# Patient Record
Sex: Male | Born: 1960 | Race: White | Hispanic: No | Smoking: Former smoker
Health system: Southern US, Community
[De-identification: ages and names within clinical notes are randomized; demographics above are authoritative.]

## PROBLEM LIST (undated history)

## (undated) DIAGNOSIS — F482 Pseudobulbar affect: Secondary | ICD-10-CM

## (undated) DIAGNOSIS — E538 Deficiency of other specified B group vitamins: Secondary | ICD-10-CM

## (undated) DIAGNOSIS — Z8709 Personal history of other diseases of the respiratory system: Secondary | ICD-10-CM

## (undated) DIAGNOSIS — I739 Peripheral vascular disease, unspecified: Secondary | ICD-10-CM

## (undated) DIAGNOSIS — M1990 Unspecified osteoarthritis, unspecified site: Secondary | ICD-10-CM

## (undated) DIAGNOSIS — R001 Bradycardia, unspecified: Secondary | ICD-10-CM

## (undated) DIAGNOSIS — K622 Anal prolapse: Secondary | ICD-10-CM

## (undated) DIAGNOSIS — G8929 Other chronic pain: Secondary | ICD-10-CM

## (undated) DIAGNOSIS — R51 Headache: Secondary | ICD-10-CM

## (undated) DIAGNOSIS — M549 Dorsalgia, unspecified: Secondary | ICD-10-CM

## (undated) HISTORY — PX: COLOSTOMY CLOSURE: SHX1381

## (undated) HISTORY — PX: COLON SURGERY: SHX602

## (undated) HISTORY — PX: ESOPHAGOGASTRODUODENOSCOPY: SHX1529

## (undated) HISTORY — PX: BACK SURGERY: SHX140

## (undated) HISTORY — PX: COLOSTOMY: SHX63

## (undated) HISTORY — DX: Pseudobulbar affect: F48.2

---

## 2013-06-12 ENCOUNTER — Encounter (HOSPITAL_COMMUNITY): Payer: Self-pay | Admitting: Emergency Medicine

## 2013-06-12 ENCOUNTER — Emergency Department (HOSPITAL_COMMUNITY): Payer: Self-pay

## 2013-06-12 ENCOUNTER — Emergency Department (HOSPITAL_COMMUNITY)
Admission: EM | Admit: 2013-06-12 | Discharge: 2013-06-13 | Disposition: A | Discharge status: Home / Self Care | Payer: MEDICAID | Attending: Emergency Medicine | Admitting: Emergency Medicine

## 2013-06-12 DIAGNOSIS — L97319 Non-pressure chronic ulcer of right ankle with unspecified severity: Secondary | ICD-10-CM

## 2013-06-12 DIAGNOSIS — Z2089 Contact with and (suspected) exposure to other communicable diseases: Secondary | ICD-10-CM

## 2013-06-12 DIAGNOSIS — I8311 Varicose veins of right lower extremity with inflammation: Secondary | ICD-10-CM

## 2013-06-12 DIAGNOSIS — Z880 Allergy status to penicillin: Secondary | ICD-10-CM | POA: Insufficient documentation

## 2013-06-12 DIAGNOSIS — L97909 Non-pressure chronic ulcer of unspecified part of unspecified lower leg with unspecified severity: Secondary | ICD-10-CM | POA: Insufficient documentation

## 2013-06-12 DIAGNOSIS — M25473 Effusion, unspecified ankle: Secondary | ICD-10-CM | POA: Insufficient documentation

## 2013-06-12 DIAGNOSIS — M25579 Pain in unspecified ankle and joints of unspecified foot: Secondary | ICD-10-CM | POA: Insufficient documentation

## 2013-06-12 DIAGNOSIS — F17200 Nicotine dependence, unspecified, uncomplicated: Secondary | ICD-10-CM | POA: Insufficient documentation

## 2013-06-12 DIAGNOSIS — I8310 Varicose veins of unspecified lower extremity with inflammation: Secondary | ICD-10-CM | POA: Insufficient documentation

## 2013-06-12 DIAGNOSIS — L259 Unspecified contact dermatitis, unspecified cause: Secondary | ICD-10-CM | POA: Insufficient documentation

## 2013-06-12 DIAGNOSIS — M25476 Effusion, unspecified foot: Secondary | ICD-10-CM | POA: Insufficient documentation

## 2013-06-12 DIAGNOSIS — Z8719 Personal history of other diseases of the digestive system: Secondary | ICD-10-CM | POA: Insufficient documentation

## 2013-06-12 DIAGNOSIS — B86 Scabies: Secondary | ICD-10-CM | POA: Insufficient documentation

## 2013-06-12 DIAGNOSIS — I83009 Varicose veins of unspecified lower extremity with ulcer of unspecified site: Secondary | ICD-10-CM | POA: Insufficient documentation

## 2013-06-12 HISTORY — DX: Anal prolapse: K62.2

## 2013-06-12 MED ORDER — OXYCODONE-ACETAMINOPHEN 5-325 MG PO TABS: 1.0000 | ORAL_TABLET | ORAL | Status: DC | PRN

## 2013-06-12 MED ORDER — HYDROCORTISONE 2.5 % EX LOTN: TOPICAL_LOTION | Freq: Two times a day (BID) | CUTANEOUS | Status: DC

## 2013-06-12 MED ORDER — OXYCODONE-ACETAMINOPHEN 5-325 MG PO TABS
2.0000 | ORAL_TABLET | Freq: Once | ORAL | Status: AC
  Administered 2013-06-12: 2 via ORAL
  Filled 2013-06-12: qty 2

## 2013-06-12 MED ORDER — ENOXAPARIN SODIUM 100 MG/ML ~~LOC~~ SOLN
90.0000 mg | Freq: Once | SUBCUTANEOUS | Status: AC
  Administered 2013-06-12: 90 mg via SUBCUTANEOUS
  Filled 2013-06-12: qty 1

## 2013-06-12 MED ORDER — PERMETHRIN 5 % EX CREA: TOPICAL_CREAM | CUTANEOUS | Status: DC

## 2013-06-12 MED ORDER — DIPHENHYDRAMINE HCL 25 MG PO TABS: 25.0000 mg | ORAL_TABLET | Freq: Four times a day (QID) | ORAL | Status: DC | PRN

## 2013-06-12 NOTE — ED Provider Notes (Signed)
CSN: [id number]     Arrival date & time 06/12/13  2003 History  This chart was scribed for Dierdre Forth, PA-C, working with Loren Racer, MD, by Ardelia Mems ED Scribe. This patient was seen in room TR05C/TR05C and the patient's care was started at 9:29 PM.   First MD Initiated Contact with Patient 06/12/13 2128     Chief Complaint  Patient presents with  . Foot Pain    The history is provided by the patient. No language interpreter was used.   HPI Comments: Cody Baird is a 52 y.o. male who presents to the Emergency Department complaining of gradually worsening, constant, moderate pain to the dorsal surface of his right foot and to his right toes since 04/08/13. He states that he sustained a small cut to the top of his right foot on 04/08/13, and he had pain in his right foot and right calf for about 2 weeks, and the calf pain mostly subsided, but the foot pain persists. He denies any musculoskeletal injury. The tips of his toes on his right foot also have a bluish tent. He states that he was seen by his PCP today and was told to come to the ED. He states that he has been taking Ibuprofen and Aleve without relief of his pain. He states that a friend gave him a Vicodin, which he took without relief of his pain. 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 He states that he has a history of 3 back surgeries. He states that he lost sensation in his right foot after one of these surgeries, but states that he regained most of his feeling in his feet. He states that he has no past diagnosis of DM, but expresses concern that he may be diabetic due to his current symptoms. He states that he does not urinate frequently, and he doe not have excessive thirst on a regular basis. He denies fever, chills, nausea, rash or any other symptoms. He is a current every day smoker and an occasional alcohol user.   Past Medical History  Diagnosis Date  . Prolapsed, anus    Past Surgical History   Procedure Laterality Date  . Back surgery    . Back surgery    . Colon surgery     No family history on file. History  Substance Use Topics  . Smoking status: Current Every Day Smoker  . Smokeless tobacco: Not on file  . Alcohol Use: Yes    Review of Systems  Constitutional: Negative for fever, diaphoresis, appetite change, fatigue and unexpected weight change.  HENT: Negative for mouth sores and neck stiffness.   Eyes: Negative for visual disturbance.  Respiratory: Negative for cough, chest tightness, shortness of breath and wheezing.   Cardiovascular: Negative for chest pain.  Gastrointestinal: Negative for nausea, vomiting, abdominal pain, diarrhea and constipation.  Endocrine: Negative for polydipsia, polyphagia and polyuria.  Genitourinary: Negative for dysuria, urgency, frequency and hematuria.  Musculoskeletal: Positive for joint swelling and arthralgias. Negative for back pain.       Right foot pain.  Skin: Negative for rash.  Allergic/Immunologic: Negative for immunocompromised state.  Neurological: Negative for syncope, light-headedness and headaches.  Hematological: Does not bruise/bleed easily.  Psychiatric/Behavioral: Negative for sleep disturbance. The patient is not nervous/anxious.   All other systems reviewed and are negative.    Allergies  Penicillins  Home Medications   Current Outpatient Rx  Name  Route  Sig  Dispense  Refill  .  Ibuprofen (IBU PO)   Oral   Take 4 tablets by mouth every 6 (six) hours as needed (pain).          Triage Vitals: BP 148/78  Pulse 107  Temp(Src) 98.5 F (36.9 C) (Oral)  Resp 14  SpO2 98%  Physical Exam  Nursing note and vitals reviewed. Constitutional: He is oriented to person, place, and time. He appears well-developed and well-nourished. No distress.  HENT:  Head: Normocephalic and atraumatic.  Eyes: Conjunctivae and EOM are normal. Pupils are equal, round, and reactive to light.  Neck: Normal range of  motion.  Cardiovascular: Normal rate, regular rhythm, normal heart sounds and intact distal pulses.   No murmur heard. Capillary refill 5 seconds  Pulmonary/Chest: Effort normal and breath sounds normal. No respiratory distress. He has no wheezes.  Musculoskeletal: He exhibits tenderness. He exhibits no edema.  ROM: Full range of motion of all joints of the bilateral lower extremities No pain to palpation of the bilateral calves, negative Homans sign, no palpable cord; no calf swelling no peripheral edema  Lymphadenopathy:    He has no cervical adenopathy.  Neurological: He is alert and oriented to person, place, and time. Coordination normal.  Sensation intact to dull and sharp Strength 5 out of 5 in the bilateral lower extremities  Skin: Skin is warm and dry. He is not diaphoretic. There is erythema.  3 cm x 2 cm lesion to the top of his right foot with surrounding erythema.  Rudy color extends to the tips of his toes and his middle calf. Toes of the right foot are discolored blue, cool to the touch, with capillary refill at 5 seconds.  No tenting of the skin  Increased varicosities of the right leg  Psychiatric: He has a normal mood and affect.    ED Course   Medications  enoxaparin (LOVENOX) injection 1 mg/kg (not administered)  oxyCODONE-acetaminophen (PERCOCET/ROXICET) 5-325 MG per tablet 2 tablet (2 tablets Oral Given 06/12/13 2223)   Procedures (including critical care time)  DIAGNOSTIC STUDIES: Oxygen Saturation is 98% on RA, normal by my interpretation.    COORDINATION OF CARE: 9:59 PM- Pt advised of plan for treatment and pt agrees.   Labs Reviewed  GLUCOSE, CAPILLARY - Abnormal; Notable for the following:    Glucose-Capillary 119 (*)    All other components within normal limits    Dg Foot Complete Right  06/12/2013   *RADIOLOGY REPORT*  Clinical Data: Foot pain.  Right ankle wound.  RIGHT FOOT COMPLETE - 3+ VIEW  Comparison: None.  Findings: No acute fracture,  dislocation or bony destruction is identified.  Soft tissues are unremarkable.  No foreign body is seen.  No significant arthropathy.  IMPRESSION: Normal right foot radiographs.   Original Report Authenticated By: Irish Lack, M.D.   1. Venous stasis dermatitis, right   2. Venous stasis ulcer of ankle, right     MDM  Cody Baird sense with nonhealing lesion and color change of the right lower extremity beginning just below the calf.  Wound consistent with venous stasis ulcer.  No evidence of cellulitis or abscess.  Decreased capillary refill with increased varicosities of the right leg. Concern for possible DVT versus venous stasis/PVD.  This patient was dosed with Lovenox here in the emergency department and we will have him followup with venous duplex scan in the morning.    Dr. Loren Racer was consulted, evaluated this patient with me and agrees with the plan.    I  personally performed the services described in this documentation, which was scribed in my presence. The recorded information has been reviewed and is accurate.    Cody Client Che Rachal, PA-C 06/13/13 0041

## 2013-06-12 NOTE — ED Notes (Signed)
PT. REPORTS RIGHT FOOT PAIN / SWELLING AT TOE JOINTS FOR 6 WEEKS , DENIES INJURY / AMBULATORY.

## 2013-06-12 NOTE — ED Notes (Signed)
Pt reports right foot pain since this past [facility] day. States that he had cut himself on right foot and wound has never healed properly. Left foot red and inflamed, painful to the touch. Good pedal pulse and capillary refill on all digits.

## 2013-06-13 ENCOUNTER — Ambulatory Visit (HOSPITAL_COMMUNITY)
Admission: RE | Admit: 2013-06-13 | Discharge: 2013-06-13 | Disposition: A | Discharge status: Home / Self Care | Payer: MEDICAID | Source: Ambulatory Visit | Attending: Emergency Medicine | Admitting: Emergency Medicine

## 2013-06-13 DIAGNOSIS — M79609 Pain in unspecified limb: Secondary | ICD-10-CM | POA: Insufficient documentation

## 2013-06-13 MED ORDER — ONDANSETRON HCL 4 MG/2ML IJ SOLN
INTRAMUSCULAR | Status: AC
  Filled 2013-06-13: qty 2

## 2013-06-13 MED ORDER — MORPHINE SULFATE 4 MG/ML IJ SOLN
INTRAMUSCULAR | Status: AC
  Filled 2013-06-13: qty 2

## 2013-06-13 NOTE — ED Provider Notes (Signed)
Medical screening examination/treatment/procedure(s) were performed by non-physician practitioner and as supervising physician I was immediately available for consultation/collaboration.   Loren Racer, MD 06/13/13 [phone]

## 2013-06-13 NOTE — Progress Notes (Signed)
Bilateral lower extremity venous duplex:  No evidence of DVT, superficial thrombosis, or Baker's Cyst.   

## 2013-06-25 ENCOUNTER — Encounter: Payer: Self-pay | Admitting: *Deleted

## 2013-06-25 ENCOUNTER — Encounter (HOSPITAL_COMMUNITY): Payer: Self-pay | Admitting: Pharmacy Technician

## 2013-06-25 ENCOUNTER — Ambulatory Visit (INDEPENDENT_AMBULATORY_CARE_PROVIDER_SITE_OTHER): Payer: Self-pay | Admitting: Vascular Surgery

## 2013-06-25 ENCOUNTER — Encounter: Payer: Self-pay | Admitting: Vascular Surgery

## 2013-06-25 ENCOUNTER — Other Ambulatory Visit: Payer: Self-pay | Admitting: *Deleted

## 2013-06-25 VITALS — BP 123/80 | HR 80 | Ht 72.0 in | Wt 192.0 lb

## 2013-06-25 DIAGNOSIS — Z01818 Encounter for other preprocedural examination: Secondary | ICD-10-CM

## 2013-06-25 DIAGNOSIS — I739 Peripheral vascular disease, unspecified: Secondary | ICD-10-CM

## 2013-06-25 DIAGNOSIS — M79609 Pain in unspecified limb: Secondary | ICD-10-CM

## 2013-06-25 HISTORY — DX: Peripheral vascular disease, unspecified: I73.9

## 2013-06-25 HISTORY — DX: Pain in unspecified limb: M79.609

## 2013-06-25 NOTE — Progress Notes (Signed)
. [facility] [place]   Patient name: Cody Baird MRN: [id number] DOB: [birth year] Sex: male     Reason for referral:  Chief Complaint  Patient presents with  . New Evaluation    decreased pulses in feet c/o rt foot pain -work in     HISTORY OF PRESENT ILLNESS: Patient has today for a long regarding severe peripheral arterial disease in his right foot. This has been progressive over the last 7 weeks. 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 He felt this was related to nerve issues. This has been progressive. He did see a primary care and had a venous duplex which was normal in 06/13/2013 and [facility] have reviewed the studies. He presented last night to the [facility] emergency department and was felt to have arterial insufficiency. He did have motor and sensory function and is referred our office this morning. The patient reports although this is been present for 7 weeks has been more progressive over the last 2 weeks. He does report constant pain associated with this. He does have some dusky changes to his foot as well. He specifically denies any prior history of claudication type symptoms. He has no left leg symptoms. His past history is negative for any cardiac disease and negative for any history of stroke her amaurosis fugax or transient ischemic attack. He does have a 35 year history of smoking approximately 2 packs per day  Past Medical History  Diagnosis Date  . Prolapsed, anus     Past Surgical History  Procedure Laterality Date  . Back surgery    . Back surgery    . Colon surgery      History   Social History  . Marital Status: Widowed    Spouse Name: N/A    Number of Children: N/A  . Years of Education: N/A   Occupational History  . Not on file.   Social History Main Topics  . Smoking status: Current Every Day Smoker -- 1.50 packs/day for 35 years    Types:  Cigarettes  . Smokeless tobacco: Not on file  . Alcohol Use: 3.6 oz/week    6 Shots of liquor per week  . Drug Use: No  . Sexually Active: Not on file   Other Topics Concern  . Not on file   Social History Narrative  . No narrative on file    Family History  Problem Relation Age of Onset  . Cancer Mother   . Diabetes Father   . Hyperlipidemia Father   . Hypertension Father   . Heart attack Father     Allergies as of 06/25/2013 - Review Complete 06/25/2013  Allergen Reaction Noted  . Penicillins  06/12/2013    Current Outpatient Prescriptions on File Prior to Visit  Medication Sig Dispense Refill  . diphenhydrAMINE (BENADRYL) 25 MG tablet Take 1 tablet (25 mg total) by mouth every 6 (six) hours as needed for itching (Rash).  30 tablet  0  . oxyCODONE-acetaminophen (PERCOCET/ROXICET) 5-325 MG per tablet Take 1 tablet by mouth every 4 (four) hours as needed for pain.  15 tablet  0  . hydrocortisone 2.5 % lotion Apply topically 2 (two) times daily.  59 mL  0  . Ibuprofen (IBU PO) Take 4 tablets by mouth every 6 (six) hours as needed (pain).      . permethrin (ELIMITE) 5 % cream Apply to entire  body other than face - let sit for 14 hours then wash off, may repeat in 1 week if still having symptoms  60 g  1   No current facility-administered medications on file prior to visit.     REVIEW OF SYSTEMS:  Positives indicated with an "X"  CARDIOVASCULAR:  [ ]  chest pain   [ ]  chest pressure   [ ]  palpitations   [ ]  orthopnea   [ ]  dyspnea on exertion   [ ]  claudication   [ ]  rest pain   [ ]  DVT   [ ]  phlebitis PULMONARY:   [ ]  productive cough   [ ]  asthma   [ ]  wheezing NEUROLOGIC:   [ ]  weakness  [ ]  paresthesias  [ ]  aphasia  [ ]  amaurosis  [ ]  dizziness HEMATOLOGIC:   [ ]  bleeding problems   [ ]  clotting disorders MUSCULOSKELETAL:  [ ]  joint pain   [ ]  joint swelling GASTROINTESTINAL: [ ]   blood in stool  [ ]   hematemesis GENITOURINARY:  [ ]   dysuria  [ ]    hematuria PSYCHIATRIC:  [ ]  history of major depression INTEGUMENTARY:  [ ]  rashes  [ ]  ulcers CONSTITUTIONAL:  [ ]  fever   [ ]  chills  PHYSICAL EXAMINATION:  General: The patient is a well-nourished male, in no acute distress. Vital signs are BP 123/80  Pulse 80  Ht 6' (1.829 m)  Wt 192 lb (87.091 kg)  BMI 26.03 kg/m2  SpO2 98% Pulmonary: There is a good air exchange bilaterally without wheezing or rales. Abdomen: Soft and non-tender with normal pitch bowel sounds. Musculoskeletal: There are no major deformities.  There is no significant extremity pain. Neurologic: No focal weakness or paresthesias are detected, Skin: There are no ulcer or rashes noted. Psychiatric: The patient has normal affect. Cardiovascular: There is a regular rate and rhythm without significant murmur appreciated. Pulse status: 2+ radial pulses 2+ femoral pulses. He has absent right popliteal and distal pulses. He does have a 2+ left posterior tibial pulse Hand held Doppler shows dampened monophasic flow in his right posterior tibial artery no signal in his dorsalis pedis or peroneal  Impression and Plan:  Critical limb ischemia with progression over the last 7 weeks. He does understand the magnitude of this at this isn't limb threatening. I did have a discussion with him regarding the critical necessity of smoking cessation. He is scheduled for arteriography tomorrow at [facility]. I explained that there is a remote chance that he could have endovascular treatment but in all likelihood would require infrainguinal bypass.    EARLY, [name] [facility] [place] Office: [phone]

## 2013-06-26 ENCOUNTER — Other Ambulatory Visit: Payer: Self-pay | Admitting: *Deleted

## 2013-06-26 ENCOUNTER — Encounter (HOSPITAL_COMMUNITY)
Admission: RE | Disposition: A | Discharge status: Home / Self Care | Payer: Self-pay | Source: Ambulatory Visit | Attending: Vascular Surgery

## 2013-06-26 ENCOUNTER — Encounter (HOSPITAL_COMMUNITY): Payer: Self-pay | Admitting: *Deleted

## 2013-06-26 ENCOUNTER — Ambulatory Visit (HOSPITAL_COMMUNITY)
Admission: RE | Admit: 2013-06-26 | Discharge: 2013-06-26 | Disposition: A | Discharge status: Home / Self Care | Payer: MEDICAID | Source: Ambulatory Visit | Attending: Vascular Surgery | Admitting: Vascular Surgery

## 2013-06-26 ENCOUNTER — Other Ambulatory Visit (HOSPITAL_COMMUNITY): Payer: Self-pay

## 2013-06-26 DIAGNOSIS — I7092 Chronic total occlusion of artery of the extremities: Secondary | ICD-10-CM | POA: Insufficient documentation

## 2013-06-26 DIAGNOSIS — Z01818 Encounter for other preprocedural examination: Secondary | ICD-10-CM

## 2013-06-26 DIAGNOSIS — I739 Peripheral vascular disease, unspecified: Secondary | ICD-10-CM

## 2013-06-26 DIAGNOSIS — F17200 Nicotine dependence, unspecified, uncomplicated: Secondary | ICD-10-CM | POA: Insufficient documentation

## 2013-06-26 DIAGNOSIS — I70299 Other atherosclerosis of native arteries of extremities, unspecified extremity: Secondary | ICD-10-CM | POA: Insufficient documentation

## 2013-06-26 DIAGNOSIS — Z880 Allergy status to penicillin: Secondary | ICD-10-CM | POA: Insufficient documentation

## 2013-06-26 DIAGNOSIS — IMO0002 Reserved for concepts with insufficient information to code with codable children: Secondary | ICD-10-CM | POA: Insufficient documentation

## 2013-06-26 HISTORY — PX: ABDOMINAL AORTAGRAM: SHX5454

## 2013-06-26 LAB — POCT I-STAT, CHEM 8
BUN: 8 mg/dL (ref 6–23)
Calcium, Ion: 1.21 mmol/L (ref 1.12–1.23)
Chloride: 102 mEq/L (ref 96–112)
Creatinine, Ser: 0.8 mg/dL (ref 0.50–1.35)
Glucose, Bld: 99 mg/dL (ref 70–99)
TCO2: 24 mmol/L (ref 0–100)

## 2013-06-26 LAB — URINALYSIS, ROUTINE W REFLEX MICROSCOPIC
Ketones, ur: NEGATIVE mg/dL
Leukocytes, UA: NEGATIVE
Nitrite: NEGATIVE
Protein, ur: NEGATIVE mg/dL
pH: 7.5 (ref 5.0–8.0)

## 2013-06-26 LAB — CBC
MCV: 99.2 fL (ref 78.0–100.0)
Platelets: 204 10*3/uL (ref 150–400)
RBC: 4.95 MIL/uL (ref 4.22–5.81)
RDW: 13.3 % (ref 11.5–15.5)
WBC: 7.7 10*3/uL (ref 4.0–10.5)

## 2013-06-26 LAB — COMPREHENSIVE METABOLIC PANEL
ALT: 19 U/L (ref 0–53)
AST: 22 U/L (ref 0–37)
Albumin: 3.2 g/dL — ABNORMAL LOW (ref 3.5–5.2)
Chloride: 100 mEq/L (ref 96–112)
Creatinine, Ser: 0.89 mg/dL (ref 0.50–1.35)
Potassium: 3.5 mEq/L (ref 3.5–5.1)
Sodium: 135 mEq/L (ref 135–145)
Total Bilirubin: 0.9 mg/dL (ref 0.3–1.2)

## 2013-06-26 LAB — SURGICAL PCR SCREEN
MRSA, PCR: NEGATIVE
Staphylococcus aureus: POSITIVE — AB

## 2013-06-26 LAB — APTT: aPTT: 29 seconds (ref 24–37)

## 2013-06-26 LAB — PROTIME-INR: INR: 0.98 (ref 0.00–1.49)

## 2013-06-26 SURGERY — ABDOMINAL AORTAGRAM
Anesthesia: LOCAL

## 2013-06-26 MED ORDER — SODIUM CHLORIDE 0.9 % IV SOLN
INTRAVENOUS | Status: DC
  Administered 2013-06-26: 100 mL/h via INTRAVENOUS

## 2013-06-26 MED ORDER — HEPARIN (PORCINE) IN NACL 2-0.9 UNIT/ML-% IJ SOLN
INTRAMUSCULAR | Status: AC
  Filled 2013-06-26: qty 1000

## 2013-06-26 MED ORDER — LIDOCAINE HCL (PF) 1 % IJ SOLN
INTRAMUSCULAR | Status: AC
  Filled 2013-06-26: qty 30

## 2013-06-26 MED ORDER — MORPHINE SULFATE 2 MG/ML IJ SOLN
INTRAMUSCULAR | Status: AC
  Filled 2013-06-26: qty 1

## 2013-06-26 MED ORDER — OXYCODONE-ACETAMINOPHEN 5-325 MG PO TABS
1.0000 | ORAL_TABLET | ORAL | Status: DC | PRN
  Administered 2013-06-26: 2 via ORAL

## 2013-06-26 MED ORDER — OXYCODONE-ACETAMINOPHEN 5-325 MG PO TABS
2.0000 | ORAL_TABLET | Freq: Once | ORAL | Status: AC
  Administered 2013-06-26: 2 via ORAL

## 2013-06-26 MED ORDER — OXYCODONE-ACETAMINOPHEN 5-325 MG PO TABS
ORAL_TABLET | ORAL | Status: AC
  Filled 2013-06-26: qty 2

## 2013-06-26 MED ORDER — ACETAMINOPHEN 325 MG PO TABS: 650.0000 mg | ORAL_TABLET | ORAL | Status: DC | PRN

## 2013-06-26 MED ORDER — FENTANYL CITRATE 0.05 MG/ML IJ SOLN
INTRAMUSCULAR | Status: AC
  Filled 2013-06-26: qty 2

## 2013-06-26 MED ORDER — SODIUM CHLORIDE 0.9 % IV SOLN: INTRAVENOUS | Status: DC

## 2013-06-26 MED ORDER — ONDANSETRON HCL 4 MG/2ML IJ SOLN: 4.0000 mg | Freq: Four times a day (QID) | INTRAMUSCULAR | Status: DC | PRN

## 2013-06-26 MED ORDER — MIDAZOLAM HCL 2 MG/2ML IJ SOLN
INTRAMUSCULAR | Status: AC
  Filled 2013-06-26: qty 2

## 2013-06-26 MED ORDER — SODIUM CHLORIDE 0.9 % IV SOLN: 1.0000 mL/kg/h | INTRAVENOUS | Status: DC

## 2013-06-26 MED ORDER — MORPHINE SULFATE 2 MG/ML IJ SOLN
1.0000 mg | INTRAMUSCULAR | Status: DC | PRN
  Administered 2013-06-26: 2 mg via INTRAVENOUS

## 2013-06-26 MED ORDER — VANCOMYCIN HCL 10 G IV SOLR: 1500.0000 mg | INTRAVENOUS | Status: DC

## 2013-06-26 NOTE — Pre-Procedure Instructions (Signed)
Cody Baird  06/26/2013   Your procedure is scheduled on:  Friday, August 15th.  Report to [facility] at 5:75M.  Call this number if you have problems the morning of surgery: [phone]   Remember:   Do not eat food or drink liquids after midnight.   Take these medicines the morning of surgery with A SIP OF WATER: Take if needed:diphenhydrAMINE (BENADRYL), oxyCODONE (OXY IR/ROXICODONE).     Do not wear jewelry, make-up or nail polish.  Do not wear lotions, powders, or perfumes. You may wear deodorant.   Men may shave face and neck.  Do not bring valuables to the hospital.  [facility] is not responsible  for any belongings or valuables.  Contacts, dentures or bridgework may not be worn into surgery.  Leave suitcase in the car. After surgery it may be brought to your room.  For patients admitted to the hospital, checkout time is 11:00 AM the day of discharge.   Patients discharged the day of surgery will not be allowed to drive home.  Name and phone number of your driver:    Special Instructions: Shower using CHG 2 nights before surgery and the night before surgery.  If you shower the day of surgery use CHG.  Use special wash - you have one bottle of CHG for all showers.  You should use approximately 1/3 of the bottle for each shower. N/A   Please read over the following fact sheets that you were given: Pain Booklet, Coughing and Deep Breathing, Blood Transfusion Information and Surgical Site Infection Prevention

## 2013-06-26 NOTE — Progress Notes (Signed)
Client sleeping.

## 2013-06-26 NOTE — Interval H&P Note (Signed)
History and Physical Interval Note:  06/26/2013 2:28 PM  Cody Baird  has presented today for surgery, with the diagnosis of pvd  The various methods of treatment have been discussed with the patient and family. After consideration of risks, benefits and other options for treatment, the patient has consented to  Procedure(s): ABDOMINAL AORTAGRAM (N/A) as a surgical intervention .  The patient's history has been reviewed, patient examined, no change in status, stable for surgery.  I have reviewed the patient's chart and labs.  Questions were answered to the patient's satisfaction.     Amiera Herzberg

## 2013-06-26 NOTE — Progress Notes (Signed)
Pt is in [facility].  He is for surgery on Friday.  Pt is having labs drawn today.  Pt has had Morphine for pain.  Pt said his mother couild sign his consent  For blood transfusion - if needed an sign the transfusion record.  Patient can sign the consent for surgery and has 2 View chest X Ray done the day of surgery.

## 2013-06-26 NOTE — Op Note (Signed)
OPERATIVE REPORT  DATE OF SURGERY: 06/26/2013  PATIENT: Cody Baird, 52 y.o. male MRN: [id number]  DOB: [birth year]  PRE-OPERATIVE DIAGNOSIS: Severe ischemia right foot  POST-OPERATIVE DIAGNOSIS:  Same  PROCEDURE: Aortogram with bilateral extremity runoff  SURGEON:  Gretta Began, M.D.   ANESTHESIA:  1% lidocaine local and IV sedation  EBL: Minimal ml     BLOOD ADMINISTERED: None  DRAINS: None  SPECIMEN: None  COUNTS CORRECT:  YES  PLAN OF CARE: Holding area   PATIENT DISPOSITION:  PACU - hemodynamically stable  PROCEDURE DETAILS: Patient was taken to the professor gas I placed prone position where very both groins are prepped and draped in usual sterile fashion. Using SonoSite ultrasound the left common femoral artery was entered with an 18-gauge needle and guidewire specimen of level suprarenal aorta. AP projection was undertaken to a pigtail catheter after a 5 French sheath was passed. This revealed single widely patent renal arteries bilaterally with no evidence of aortoiliac occlusive disease. The catheter was then withdrawn down to the level above the aortic bifurcation and aortogram and runoff was obtained. As revealed a normal arterial tree on the left with widely patent profundus superficial femoral popliteal and three-vessel runoff.  On the right leg there was an acentric plaque in the common femoral artery at the origin of the profundus femoris artery. This was not flow-limiting. There was complete occlusion of the superficial femoral and the upper third of the thigh. There was complete occlusion to of the SFA distal this including complete occlusion of the popliteal artery. There was reconstitution of the anterior tibial and tibioperoneal trunk origin with the dominant vessel being the posterior tibial runoff into the foot. There was limited dye down the right leg due to the occlusion therefore for better visualization the pigtail catheter was removed a crossover catheter  was used to position the guidewire into the distal external iliac artery. A 4 French endhole cath was passed over the wire an additional right leg views were obtained. The patient tolerated to any complication and was transferred to the holding area where sheath was pulled   Gretta Began, M.D. 06/26/2013 3:24 PM

## 2013-06-26 NOTE — H&P (View-Only) (Signed)
. [facility] [place]   Patient name: Cody Baird MRN: [id number] DOB: 11/01/1961 Sex: male     Reason for referral:  Chief Complaint  Patient presents with  . New Evaluation    decreased pulses in feet c/o rt foot pain -work in     HISTORY OF PRESENT ILLNESS: Patient has today for a long regarding severe peripheral arterial disease in his right foot. This has been progressive over the last 7 weeks. 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 He felt this was related to nerve issues. This has been progressive. He did see a primary care and had a venous duplex which was normal in 06/13/2013 and [facility]. He presented last night to the [facility] emergency department and was felt to have arterial insufficiency. He did have motor and sensory function and is referred our office this morning. The patient reports although this is been present for 7 weeks has been more progressive over the last 2 weeks. He does report constant pain associated with this. He does have some dusky changes to his foot as well. He specifically denies any prior history of claudication type symptoms. He has no left leg symptoms. His past history is negative for any cardiac disease and negative for any history of stroke her amaurosis fugax or transient ischemic attack. He does have a 35 year history of smoking approximately 2 packs per day  Past Medical History  Diagnosis Date  . Prolapsed, anus     Past Surgical History  Procedure Laterality Date  . Back surgery    . Back surgery    . Colon surgery      History   Social History  . Marital Status: Widowed    Spouse Name: N/A    Number of Children: N/A  . Years of Education: N/A   Occupational History  . Not on file.   Social History Main Topics  . Smoking status: Current Every Day Smoker -- 1.50 packs/day for 35 years    Types:  Cigarettes  . Smokeless tobacco: Not on file  . Alcohol Use: 3.6 oz/week    6 Shots of liquor per week  . Drug Use: No  . Sexually Active: Not on file   Other Topics Concern  . Not on file   Social History Narrative  . No narrative on file    Family History  Problem Relation Age of Onset  . Cancer Mother   . Diabetes Father   . Hyperlipidemia Father   . Hypertension Father   . Heart attack Father     Allergies as of 06/25/2013 - Review Complete 06/25/2013  Allergen Reaction Noted  . Penicillins  06/12/2013    Current Outpatient Prescriptions on File Prior to Visit  Medication Sig Dispense Refill  . diphenhydrAMINE (BENADRYL) 25 MG tablet Take 1 tablet (25 mg total) by mouth every 6 (six) hours as needed for itching (Rash).  30 tablet  0  . oxyCODONE-acetaminophen (PERCOCET/ROXICET) 5-325 MG per tablet Take 1 tablet by mouth every 4 (four) hours as needed for pain.  15 tablet  0  . hydrocortisone 2.5 % lotion Apply topically 2 (two) times daily.  59 mL  0  . Ibuprofen (IBU PO) Take 4 tablets by mouth every 6 (six) hours as needed (pain).      . permethrin (ELIMITE) 5 % cream Apply to entire   body other than face - let sit for 14 hours then wash off, may repeat in 1 week if still having symptoms  60 g  1   No current facility-administered medications on file prior to visit.     REVIEW OF SYSTEMS:  Positives indicated with an "X"  CARDIOVASCULAR:  [ ] chest pain   [ ] chest pressure   [ ] palpitations   [ ] orthopnea   [ ] dyspnea on exertion   [ ] claudication   [ ] rest pain   [ ] DVT   [ ] phlebitis PULMONARY:   [ ] productive cough   [ ] asthma   [ ] wheezing NEUROLOGIC:   [ ] weakness  [ ] paresthesias  [ ] aphasia  [ ] amaurosis  [ ] dizziness HEMATOLOGIC:   [ ] bleeding problems   [ ] clotting disorders MUSCULOSKELETAL:  [ ] joint pain   [ ] joint swelling GASTROINTESTINAL: [ ]  blood in stool  [ ]  hematemesis GENITOURINARY:  [ ]  dysuria  [ ]   hematuria PSYCHIATRIC:  [ ] history of major depression INTEGUMENTARY:  [ ] rashes  [ ] ulcers CONSTITUTIONAL:  [ ] fever   [ ] chills  PHYSICAL EXAMINATION:  General: The patient is a well-nourished male, in no acute distress. Vital signs are BP 123/80  Pulse 80  Ht 6' (1.829 m)  Wt 192 lb (87.091 kg)  BMI 26.03 kg/m2  SpO2 98% Pulmonary: There is a good air exchange bilaterally without wheezing or rales. Abdomen: Soft and non-tender with normal pitch bowel sounds. Musculoskeletal: There are no major deformities.  There is no significant extremity pain. Neurologic: No focal weakness or paresthesias are detected, Skin: There are no ulcer or rashes noted. Psychiatric: The patient has normal affect. Cardiovascular: There is a regular rate and rhythm without significant murmur appreciated. Pulse status: 2+ radial pulses 2+ femoral pulses. He has absent right popliteal and distal pulses. He does have a 2+ left posterior tibial pulse Hand held Doppler shows dampened monophasic flow in his right posterior tibial artery no signal in his dorsalis pedis or peroneal  Impression and Plan:  Critical limb ischemia with progression over the last 7 weeks. He does understand the magnitude of this at this isn't limb threatening. I did have a discussion with him regarding the critical necessity of smoking cessation. He is scheduled for arteriography tomorrow at [facility]. I explained that there is a remote chance that he could have endovascular treatment but in all likelihood would require infrainguinal bypass.    EARLY, [name] [facility] [place] Office: [phone]         

## 2013-06-27 ENCOUNTER — Other Ambulatory Visit: Payer: Self-pay | Admitting: *Deleted

## 2013-06-27 DIAGNOSIS — I739 Peripheral vascular disease, unspecified: Secondary | ICD-10-CM

## 2013-06-27 DIAGNOSIS — B958 Unspecified staphylococcus as the cause of diseases classified elsewhere: Secondary | ICD-10-CM

## 2013-06-27 LAB — POCT I-STAT 4, (NA,K, GLUC, HGB,HCT)
Potassium: 3.8 mEq/L (ref 3.5–5.1)
Sodium: 136 mEq/L (ref 135–145)

## 2013-06-27 LAB — TYPE AND SCREEN

## 2013-06-27 MED ORDER — HYDROCODONE-ACETAMINOPHEN 5-325 MG PO TABS: 1.0000 | ORAL_TABLET | Freq: Four times a day (QID) | ORAL | Status: DC | PRN

## 2013-06-27 MED ORDER — MUPIROCIN 2 % EX OINT: TOPICAL_OINTMENT | CUTANEOUS | Status: DC

## 2013-06-28 ENCOUNTER — Inpatient Hospital Stay (HOSPITAL_COMMUNITY)
Admission: RE | Admit: 2013-06-28 | Discharge: 2013-07-01 | DRG: 253 | Disposition: A | Discharge status: Home / Self Care | Payer: MEDICAID | Source: Ambulatory Visit | Attending: Vascular Surgery | Admitting: Vascular Surgery

## 2013-06-28 ENCOUNTER — Encounter (HOSPITAL_COMMUNITY): Payer: Self-pay | Admitting: Anesthesiology

## 2013-06-28 ENCOUNTER — Inpatient Hospital Stay (HOSPITAL_COMMUNITY): Payer: Self-pay | Admitting: Anesthesiology

## 2013-06-28 ENCOUNTER — Encounter (HOSPITAL_COMMUNITY)
Admission: RE | Disposition: A | Discharge status: Home / Self Care | Payer: Self-pay | Source: Ambulatory Visit | Attending: Vascular Surgery

## 2013-06-28 ENCOUNTER — Inpatient Hospital Stay (HOSPITAL_COMMUNITY): Payer: Self-pay

## 2013-06-28 ENCOUNTER — Encounter (HOSPITAL_COMMUNITY): Payer: Self-pay | Admitting: *Deleted

## 2013-06-28 DIAGNOSIS — E119 Type 2 diabetes mellitus without complications: Secondary | ICD-10-CM | POA: Diagnosis present

## 2013-06-28 DIAGNOSIS — Z01818 Encounter for other preprocedural examination: Secondary | ICD-10-CM

## 2013-06-28 DIAGNOSIS — I70219 Atherosclerosis of native arteries of extremities with intermittent claudication, unspecified extremity: Principal | ICD-10-CM | POA: Diagnosis present

## 2013-06-28 DIAGNOSIS — F17200 Nicotine dependence, unspecified, uncomplicated: Secondary | ICD-10-CM | POA: Diagnosis present

## 2013-06-28 DIAGNOSIS — G5790 Unspecified mononeuropathy of unspecified lower limb: Secondary | ICD-10-CM | POA: Diagnosis present

## 2013-06-28 DIAGNOSIS — I743 Embolism and thrombosis of arteries of the lower extremities: Secondary | ICD-10-CM

## 2013-06-28 DIAGNOSIS — J449 Chronic obstructive pulmonary disease, unspecified: Secondary | ICD-10-CM | POA: Diagnosis present

## 2013-06-28 DIAGNOSIS — I739 Peripheral vascular disease, unspecified: Secondary | ICD-10-CM

## 2013-06-28 DIAGNOSIS — I517 Cardiomegaly: Secondary | ICD-10-CM

## 2013-06-28 DIAGNOSIS — J4489 Other specified chronic obstructive pulmonary disease: Secondary | ICD-10-CM | POA: Diagnosis present

## 2013-06-28 DIAGNOSIS — M519 Unspecified thoracic, thoracolumbar and lumbosacral intervertebral disc disorder: Secondary | ICD-10-CM | POA: Diagnosis present

## 2013-06-28 DIAGNOSIS — M79609 Pain in unspecified limb: Secondary | ICD-10-CM

## 2013-06-28 HISTORY — PX: FEMORAL-TIBIAL BYPASS GRAFT: SHX938

## 2013-06-28 HISTORY — DX: Peripheral vascular disease, unspecified: I73.9

## 2013-06-28 LAB — CREATININE, SERUM
GFR calc Af Amer: >90 mL/min (ref >90)
GFR calc non Af Amer: >90 mL/min (ref >90)

## 2013-06-28 LAB — CBC
MCHC: 35.8 g/dL (ref 30.0–36.0)
Platelets: 167 10*3/uL (ref 150–400)
RDW: 13.1 % (ref 11.5–15.5)
WBC: 13.7 10*3/uL — ABNORMAL HIGH (ref 4.0–10.5)

## 2013-06-28 SURGERY — CREATION, BYPASS, ARTERIAL, FEMORAL TO TIBIAL, USING GRAFT
Anesthesia: General | Laterality: Right | Wound class: Clean

## 2013-06-28 MED ORDER — MORPHINE SULFATE 2 MG/ML IJ SOLN
2.0000 mg | INTRAMUSCULAR | Status: DC | PRN
  Administered 2013-06-28: 2 mg via INTRAVENOUS
  Administered 2013-06-28 – 2013-07-01 (×8): 4 mg via INTRAVENOUS
  Filled 2013-06-28 (×4): qty 2
  Filled 2013-06-28: qty 1
  Filled 2013-06-28 (×4): qty 2

## 2013-06-28 MED ORDER — DIPHENHYDRAMINE HCL 25 MG PO CAPS
25.0000 mg | ORAL_CAPSULE | Freq: Four times a day (QID) | ORAL | Status: DC | PRN
  Administered 2013-06-30 (×4): 25 mg via ORAL
  Filled 2013-06-28 (×4): qty 1

## 2013-06-28 MED ORDER — HYDROMORPHONE HCL PF 1 MG/ML IJ SOLN
INTRAMUSCULAR | Status: AC
  Filled 2013-06-28: qty 1

## 2013-06-28 MED ORDER — NEOSTIGMINE METHYLSULFATE 1 MG/ML IJ SOLN
INTRAMUSCULAR | Status: DC | PRN
  Administered 2013-06-28: 3 mg via INTRAVENOUS

## 2013-06-28 MED ORDER — OXYCODONE HCL 5 MG/5ML PO SOLN: 5.0000 mg | Freq: Once | ORAL | Status: AC | PRN

## 2013-06-28 MED ORDER — HYDROMORPHONE HCL PF 1 MG/ML IJ SOLN
INTRAMUSCULAR | Status: AC
  Administered 2013-06-28: 1 mg
  Filled 2013-06-28: qty 2

## 2013-06-28 MED ORDER — PROPOFOL 10 MG/ML IV BOLUS
INTRAVENOUS | Status: DC | PRN
  Administered 2013-06-28: 180 mg via INTRAVENOUS

## 2013-06-28 MED ORDER — HYDROMORPHONE HCL PF 1 MG/ML IJ SOLN
0.2500 mg | INTRAMUSCULAR | Status: DC | PRN
  Administered 2013-06-28 (×4): 0.5 mg via INTRAVENOUS

## 2013-06-28 MED ORDER — MORPHINE SULFATE 2 MG/ML IJ SOLN
INTRAMUSCULAR | Status: AC
  Administered 2013-06-28: 2 mg via INTRAVENOUS
  Filled 2013-06-28: qty 1

## 2013-06-28 MED ORDER — HYDROCODONE-ACETAMINOPHEN 5-325 MG PO TABS
1.0000 | ORAL_TABLET | ORAL | Status: DC | PRN
  Administered 2013-06-28 – 2013-07-01 (×15): 2 via ORAL
  Filled 2013-06-28 (×4): qty 2
  Filled 2013-06-28: qty 1
  Filled 2013-06-28 (×2): qty 2
  Filled 2013-06-28: qty 1
  Filled 2013-06-28 (×9): qty 2

## 2013-06-28 MED ORDER — ALUM & MAG HYDROXIDE-SIMETH 200-200-20 MG/5ML PO SUSP: 15.0000 mL | ORAL | Status: DC | PRN

## 2013-06-28 MED ORDER — SODIUM CHLORIDE 0.9 % IR SOLN
Status: DC | PRN
  Administered 2013-06-28: 08:00:00

## 2013-06-28 MED ORDER — ACETAMINOPHEN 650 MG RE SUPP: 325.0000 mg | RECTAL | Status: DC | PRN

## 2013-06-28 MED ORDER — OXYCODONE HCL 5 MG PO TABS
5.0000 mg | ORAL_TABLET | Freq: Once | ORAL | Status: AC | PRN
  Administered 2013-06-28: 5 mg via ORAL

## 2013-06-28 MED ORDER — SENNOSIDES-DOCUSATE SODIUM 8.6-50 MG PO TABS
1.0000 | ORAL_TABLET | Freq: Every evening | ORAL | Status: DC | PRN
  Filled 2013-06-28: qty 1

## 2013-06-28 MED ORDER — MIDAZOLAM HCL 5 MG/5ML IJ SOLN
INTRAMUSCULAR | Status: DC | PRN
  Administered 2013-06-28 (×2): 1 mg via INTRAVENOUS

## 2013-06-28 MED ORDER — LIDOCAINE HCL (CARDIAC) 20 MG/ML IV SOLN
INTRAVENOUS | Status: DC | PRN
  Administered 2013-06-28: 100 mg via INTRAVENOUS

## 2013-06-28 MED ORDER — VANCOMYCIN HCL IN DEXTROSE 1-5 GM/200ML-% IV SOLN
INTRAVENOUS | Status: AC
  Filled 2013-06-28: qty 200

## 2013-06-28 MED ORDER — PROTAMINE SULFATE 10 MG/ML IV SOLN
INTRAVENOUS | Status: DC | PRN
  Administered 2013-06-28: 10 mg via INTRAVENOUS
  Administered 2013-06-28 (×2): 20 mg via INTRAVENOUS

## 2013-06-28 MED ORDER — ACETAMINOPHEN 325 MG PO TABS
325.0000 mg | ORAL_TABLET | ORAL | Status: DC | PRN
  Administered 2013-06-30: 650 mg via ORAL
  Filled 2013-06-28: qty 2

## 2013-06-28 MED ORDER — VANCOMYCIN HCL IN DEXTROSE 1-5 GM/200ML-% IV SOLN
1000.0000 mg | Freq: Two times a day (BID) | INTRAVENOUS | Status: AC
  Administered 2013-06-28 – 2013-06-29 (×2): 1000 mg via INTRAVENOUS
  Filled 2013-06-28 (×2): qty 200

## 2013-06-28 MED ORDER — 0.9 % SODIUM CHLORIDE (POUR BTL) OPTIME
TOPICAL | Status: DC | PRN
  Administered 2013-06-28: 2000 mL

## 2013-06-28 MED ORDER — GLYCOPYRROLATE 0.2 MG/ML IJ SOLN
INTRAMUSCULAR | Status: DC | PRN
  Administered 2013-06-28: 0.4 mg via INTRAVENOUS

## 2013-06-28 MED ORDER — MUPIROCIN 2 % EX OINT
1.0000 "application " | TOPICAL_OINTMENT | Freq: Two times a day (BID) | CUTANEOUS | Status: DC
  Administered 2013-06-29 – 2013-07-01 (×5): 1 via NASAL
  Filled 2013-06-28 (×2): qty 22

## 2013-06-28 MED ORDER — MIDAZOLAM HCL 2 MG/2ML IJ SOLN
1.0000 mg | INTRAMUSCULAR | Status: DC | PRN
  Administered 2013-06-28: 1 mg via INTRAVENOUS
  Filled 2013-06-28: qty 2

## 2013-06-28 MED ORDER — HEPARIN SODIUM (PORCINE) 1000 UNIT/ML IJ SOLN
INTRAMUSCULAR | Status: DC | PRN
  Administered 2013-06-28: 8000 [IU] via INTRAVENOUS

## 2013-06-28 MED ORDER — ONDANSETRON HCL 4 MG/2ML IJ SOLN
INTRAMUSCULAR | Status: DC | PRN
  Administered 2013-06-28: 4 mg via INTRAVENOUS

## 2013-06-28 MED ORDER — FENTANYL CITRATE 0.05 MG/ML IJ SOLN
INTRAMUSCULAR | Status: DC | PRN
  Administered 2013-06-28 (×2): 50 ug via INTRAVENOUS
  Administered 2013-06-28: 100 ug via INTRAVENOUS
  Administered 2013-06-28: 50 ug via INTRAVENOUS
  Administered 2013-06-28: 100 ug via INTRAVENOUS
  Administered 2013-06-28 (×5): 50 ug via INTRAVENOUS
  Administered 2013-06-28: 100 ug via INTRAVENOUS
  Administered 2013-06-28: 50 ug via INTRAVENOUS

## 2013-06-28 MED ORDER — HYDRALAZINE HCL 20 MG/ML IJ SOLN: 10.0000 mg | INTRAMUSCULAR | Status: DC | PRN

## 2013-06-28 MED ORDER — ONDANSETRON HCL 4 MG/2ML IJ SOLN: 4.0000 mg | Freq: Four times a day (QID) | INTRAMUSCULAR | Status: DC | PRN

## 2013-06-28 MED ORDER — LACTATED RINGERS IV SOLN
INTRAVENOUS | Status: DC | PRN
  Administered 2013-06-28 (×3): via INTRAVENOUS

## 2013-06-28 MED ORDER — LACTATED RINGERS IV SOLN
INTRAVENOUS | Status: DC
  Administered 2013-06-28: 07:00:00 via INTRAVENOUS

## 2013-06-28 MED ORDER — OXYCODONE HCL 5 MG PO TABS
ORAL_TABLET | ORAL | Status: AC
  Filled 2013-06-28: qty 1

## 2013-06-28 MED ORDER — VANCOMYCIN HCL IN DEXTROSE 1-5 GM/200ML-% IV SOLN
1000.0000 mg | Freq: Two times a day (BID) | INTRAVENOUS | Status: DC
  Filled 2013-06-28 (×2): qty 200

## 2013-06-28 MED ORDER — ROCURONIUM BROMIDE 100 MG/10ML IV SOLN
INTRAVENOUS | Status: DC | PRN
  Administered 2013-06-28: 50 mg via INTRAVENOUS

## 2013-06-28 MED ORDER — VANCOMYCIN HCL 10 G IV SOLR
1500.0000 mg | Freq: Two times a day (BID) | INTRAVENOUS | Status: DC
  Filled 2013-06-28 (×2): qty 1500

## 2013-06-28 MED ORDER — CHLORHEXIDINE GLUCONATE CLOTH 2 % EX PADS
6.0000 | MEDICATED_PAD | Freq: Every day | CUTANEOUS | Status: DC
  Administered 2013-06-29 – 2013-06-30 (×2): 6 via TOPICAL

## 2013-06-28 MED ORDER — PANTOPRAZOLE SODIUM 40 MG PO TBEC
40.0000 mg | DELAYED_RELEASE_TABLET | Freq: Every day | ORAL | Status: DC
  Administered 2013-06-29 – 2013-07-01 (×3): 40 mg via ORAL
  Filled 2013-06-28 (×3): qty 1

## 2013-06-28 MED ORDER — FENTANYL CITRATE 0.05 MG/ML IJ SOLN
50.0000 ug | Freq: Once | INTRAMUSCULAR | Status: AC
  Administered 2013-06-28: 50 ug via INTRAVENOUS
  Filled 2013-06-28: qty 2

## 2013-06-28 MED ORDER — LABETALOL HCL 5 MG/ML IV SOLN
10.0000 mg | INTRAVENOUS | Status: DC | PRN
  Filled 2013-06-28: qty 4

## 2013-06-28 MED ORDER — VECURONIUM BROMIDE 10 MG IV SOLR
INTRAVENOUS | Status: DC | PRN
  Administered 2013-06-28 (×3): 1 mg via INTRAVENOUS
  Administered 2013-06-28 (×2): 2 mg via INTRAVENOUS

## 2013-06-28 MED ORDER — SODIUM CHLORIDE 0.9 % IV SOLN: 500.0000 mL | Freq: Once | INTRAVENOUS | Status: AC | PRN

## 2013-06-28 MED ORDER — ENOXAPARIN SODIUM 40 MG/0.4ML ~~LOC~~ SOLN
40.0000 mg | SUBCUTANEOUS | Status: DC
  Administered 2013-06-29 – 2013-07-01 (×3): 40 mg via SUBCUTANEOUS
  Filled 2013-06-28 (×4): qty 0.4

## 2013-06-28 MED ORDER — IOHEXOL 300 MG/ML  SOLN
INTRAMUSCULAR | Status: DC | PRN
  Administered 2013-06-28: 50 mL via INTRAVENOUS

## 2013-06-28 MED ORDER — PROMETHAZINE HCL 25 MG/ML IJ SOLN
6.2500 mg | INTRAMUSCULAR | Status: DC | PRN
Start: 2013-06-28 — End: 2013-06-28

## 2013-06-28 MED ORDER — DOCUSATE SODIUM 100 MG PO CAPS
100.0000 mg | ORAL_CAPSULE | Freq: Every day | ORAL | Status: DC
  Administered 2013-06-29 – 2013-07-01 (×3): 100 mg via ORAL
  Filled 2013-06-28 (×3): qty 1

## 2013-06-28 MED ORDER — PHENOL 1.4 % MT LIQD: 1.0000 | OROMUCOSAL | Status: DC | PRN

## 2013-06-28 MED ORDER — VANCOMYCIN HCL 1000 MG IV SOLR
1000.0000 mg | Freq: Two times a day (BID) | INTRAVENOUS | Status: DC
  Administered 2013-06-28: 1000 mg via INTRAVENOUS

## 2013-06-28 MED ORDER — GUAIFENESIN-DM 100-10 MG/5ML PO SYRP: 15.0000 mL | ORAL_SOLUTION | ORAL | Status: DC | PRN

## 2013-06-28 MED ORDER — DEXTROSE-NACL 5-0.9 % IV SOLN
INTRAVENOUS | Status: DC
  Administered 2013-06-28: 100 mL/h via INTRAVENOUS

## 2013-06-28 MED ORDER — METOPROLOL TARTRATE 1 MG/ML IV SOLN: 2.0000 mg | INTRAVENOUS | Status: DC | PRN

## 2013-06-28 SURGICAL SUPPLY — 62 items
BANDAGE ESMARK 6X9 LF (GAUZE/BANDAGES/DRESSINGS) IMPLANT
BENZOIN TINCTURE PRP APPL 2/3 (GAUZE/BANDAGES/DRESSINGS) ×6 IMPLANT
BNDG ESMARK 6X9 LF (GAUZE/BANDAGES/DRESSINGS)
CANISTER SUCTION 2500CC (MISCELLANEOUS) ×2 IMPLANT
CANNULA VESSEL W/WING WO/VALVE (CANNULA) ×2 IMPLANT
CATH EMB 4FR 80CM (CATHETERS) ×2 IMPLANT
CLIP FOGARTY SPRING 6M (CLIP) IMPLANT
CLIP LIGATING EXTRA MED SLVR (CLIP) ×4 IMPLANT
CLIP LIGATING EXTRA SM BLUE (MISCELLANEOUS) ×4 IMPLANT
CLOTH BEACON ORANGE TIMEOUT ST (SAFETY) ×2 IMPLANT
COVER PROBE W GEL 5X96 (DRAPES) ×2 IMPLANT
COVER SURGICAL LIGHT HANDLE (MISCELLANEOUS) ×2 IMPLANT
CUFF TOURNIQUET SINGLE 34IN LL (TOURNIQUET CUFF) IMPLANT
CUFF TOURNIQUET SINGLE 44IN (TOURNIQUET CUFF) IMPLANT
DRAIN SNY 10X20 3/4 PERF (WOUND CARE) IMPLANT
DRAPE WARM FLUID 44X44 (DRAPE) ×2 IMPLANT
DRAPE X-RAY CASS 24X20 (DRAPES) ×2 IMPLANT
DRSG COVADERM 4X10 (GAUZE/BANDAGES/DRESSINGS) ×2 IMPLANT
DRSG COVADERM 4X14 (GAUZE/BANDAGES/DRESSINGS) ×2 IMPLANT
DRSG COVADERM 4X8 (GAUZE/BANDAGES/DRESSINGS) ×2 IMPLANT
ELECT REM PT RETURN 9FT ADLT (ELECTROSURGICAL) ×2
ELECTRODE REM PT RTRN 9FT ADLT (ELECTROSURGICAL) ×1 IMPLANT
EVACUATOR SILICONE 100CC (DRAIN) IMPLANT
GLOVE BIO SURGEON STRL SZ 6.5 (GLOVE) ×4 IMPLANT
GLOVE BIOGEL PI IND STRL 6.5 (GLOVE) ×5 IMPLANT
GLOVE BIOGEL PI IND STRL 7.0 (GLOVE) ×3 IMPLANT
GLOVE BIOGEL PI INDICATOR 6.5 (GLOVE) ×5
GLOVE BIOGEL PI INDICATOR 7.0 (GLOVE) ×3
GLOVE ECLIPSE 6.0 STRL STRAW (GLOVE) ×2 IMPLANT
GLOVE ECLIPSE 6.5 STRL STRAW (GLOVE) ×2 IMPLANT
GLOVE SS BIOGEL STRL SZ 7.5 (GLOVE) ×1 IMPLANT
GLOVE SUPERSENSE BIOGEL SZ 7.5 (GLOVE) ×1
GOWN STRL NON-REIN LRG LVL3 (GOWN DISPOSABLE) ×10 IMPLANT
INSERT FOGARTY SM (MISCELLANEOUS) IMPLANT
KIT BASIN OR (CUSTOM PROCEDURE TRAY) ×2 IMPLANT
KIT ROOM TURNOVER OR (KITS) ×2 IMPLANT
NS IRRIG 1000ML POUR BTL (IV SOLUTION) ×4 IMPLANT
PACK PERIPHERAL VASCULAR (CUSTOM PROCEDURE TRAY) ×2 IMPLANT
PAD ARMBOARD 7.5X6 YLW CONV (MISCELLANEOUS) ×4 IMPLANT
PADDING CAST COTTON 6X4 STRL (CAST SUPPLIES) IMPLANT
SET COLLECT BLD 21X3/4 12 (NEEDLE) ×2 IMPLANT
STAPLER VISISTAT 35W (STAPLE) IMPLANT
STOPCOCK 4 WAY LG BORE MALE ST (IV SETS) ×2 IMPLANT
STRIP CLOSURE SKIN 1/2X4 (GAUZE/BANDAGES/DRESSINGS) ×6 IMPLANT
SUT ETHILON 3 0 PS 1 (SUTURE) IMPLANT
SUT PROLENE 5 0 C 1 24 (SUTURE) ×4 IMPLANT
SUT PROLENE 6 0 CC (SUTURE) ×8 IMPLANT
SUT SILK 2 0 SH (SUTURE) ×2 IMPLANT
SUT SILK 3 0 (SUTURE) ×2
SUT SILK 3-0 18XBRD TIE 12 (SUTURE) ×2 IMPLANT
SUT SILK 4 0 (SUTURE) ×1
SUT SILK 4-0 18XBRD TIE 12 (SUTURE) ×1 IMPLANT
SUT VIC AB 2-0 CTX 36 (SUTURE) ×4 IMPLANT
SUT VIC AB 3-0 SH 27 (SUTURE) ×2
SUT VIC AB 3-0 SH 27X BRD (SUTURE) ×2 IMPLANT
SYR 3ML LL SCALE MARK (SYRINGE) ×2 IMPLANT
TOWEL OR 17X24 6PK STRL BLUE (TOWEL DISPOSABLE) ×4 IMPLANT
TOWEL OR 17X26 10 PK STRL BLUE (TOWEL DISPOSABLE) ×4 IMPLANT
TRAY FOLEY CATH 16FRSI W/METER (SET/KITS/TRAYS/PACK) ×2 IMPLANT
TUBING EXTENTION W/L.L. (IV SETS) ×2 IMPLANT
UNDERPAD 30X30 INCONTINENT (UNDERPADS AND DIAPERS) ×2 IMPLANT
WATER STERILE IRR 1000ML POUR (IV SOLUTION) ×2 IMPLANT

## 2013-06-28 NOTE — Transfer of Care (Signed)
Immediate Anesthesia Transfer of Care Note  Patient: Cody Baird  Procedure(s) Performed: Procedure(s): BYPASS GRAFT FEMORAL TO TIBIAL PERONEAL TRUNK ARTERY (Right)  Patient Location: PACU  Anesthesia Type:General  Level of Consciousness: awake, alert  and oriented  Airway & Oxygen Therapy: Patient Spontanous Breathing and Patient connected to nasal cannula oxygen  Post-op Assessment: Report given to PACU RN and Post -op Vital signs reviewed and stable  Post vital signs: Reviewed and stable  Complications: No apparent anesthesia complications

## 2013-06-28 NOTE — Preoperative (Signed)
Beta Blockers   Reason not to administer Beta Blockers:Not Applicable 

## 2013-06-28 NOTE — Progress Notes (Deleted)
ANTIBIOTIC CONSULT NOTE - INITIAL  Pharmacy Consult for vancomycin Indication: Surgical prophylaxis  Allergies  Allergen Reactions  . Penicillins     Patient Measurements: Height: 6' (182.9 cm) Weight: 192 lb 0.3 oz (87.1 kg) IBW/kg (Calculated) : 77.6  Vital Signs: Temp: 98 F (36.7 C) (08/15 1330) Temp src: Oral (08/15 0634) BP: 149/92 mmHg (08/15 1330) Pulse Rate: 103 (08/15 1345) Intake/Output from previous day:   Intake/Output from this shift: Total I/O In: 2300 [I.V.:2300] Out: 510 [Urine:410; Blood:100]  Labs:  Recent Labs  06/26/13 1137 06/26/13 1144 06/26/13 1704  WBC  --   --  7.7  HGB 18.7* 18.4* 17.5*  PLT  --   --  204  CREATININE  --  0.80 0.89   Estimated Creatinine Clearance: 107.8 ml/min (by C-G formula based on Cr of 0.89). No results found for this basename: VANCOTROUGH, VANCOPEAK, VANCORANDOM, GENTTROUGH, GENTPEAK, GENTRANDOM, TOBRATROUGH, TOBRAPEAK, TOBRARND, AMIKACINPEAK, AMIKACINTROU, AMIKACIN,  in the last 72 hours     Medical History: Past Medical History  Diagnosis Date  . Prolapsed, anus   . Peripheral vascular disease   . PAD (peripheral artery disease)   . Shortness of breath     with exertion    Medications:  Prescriptions prior to admission  Medication Sig Dispense Refill  . diphenhydrAMINE (BENADRYL) 25 mg capsule Take 25 mg by mouth every 6 (six) hours as needed for itching.      Marland Kitchen HYDROcodone-acetaminophen (NORCO/VICODIN) 5-325 MG per tablet Take 1 tablet by mouth every 6 (six) hours as needed for pain.  10 tablet  0  . mupirocin ointment (BACTROBAN) 2 % 1 Application, nasally, 2 times daily prior to surgery  22 g  0  . oxyCODONE (OXY IR/ROXICODONE) 5 MG immediate release tablet Take 5 mg by mouth every 6 (six) hours as needed for pain.      Marland Kitchen permethrin (ELIMITE) 5 % cream Apply 1 application topically once. *apply to entire body except for the face, let sit for 14 hours, then wash off. May repeat in 1 week if still  having symptoms      . Pramoxine-Calamine (AVEENO ANTI-ITCH) 1-3 % CREA Apply 1 application topically 2 (two) times daily as needed (for itching).       Scheduled:  . [START ON 06/29/2013] docusate sodium  100 mg Oral Daily  . [START ON 06/29/2013] enoxaparin (LOVENOX) injection  40 mg Subcutaneous Q24H  . HYDROmorphone      . morphine      . oxyCODONE      . [START ON 06/29/2013] pantoprazole  40 mg Oral Daily  . vancomycin  1,000 mg Intravenous Q12H     Assessment: 52 yo male with severe R foot PAD s/p bypass graft on vancomycin 1000mg  IV q12h post surgery. SCr= 0.89 and CrCl ~ 110.   Last vancomycin dose was 1000mg  at about 8am today. Spoke to Dr. Arbie Cookey and duration of therapy is the standard of 24hrs.  Goal of Therapy:  Vancomycin trough level 10-15 mcg/ml  Plan:  -Will change vancomycin to 1500mg  IV q12hr x2 doses (adjusted for weight and renal function) -Will sign off, please contact pharmacy for any other needs  Harland German, Pharm D 06/28/2013 3:05 PM

## 2013-06-28 NOTE — H&P (View-Only) (Signed)
. [facility] [place]   Patient name: Cody Baird MRN: [id number] DOB: 05/26/1961 Sex: male     Reason for referral:  Chief Complaint  Patient presents with  . New Evaluation    decreased pulses in feet c/o rt foot pain -work in     HISTORY OF PRESENT ILLNESS: Patient has today for a long regarding severe peripheral arterial disease in his right foot. This has been progressive over the last 7 weeks. 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 He felt this was related to nerve issues. This has been progressive. He did see a primary care and had a venous duplex which was normal in 06/13/2013 and [facility] have reviewed the studies. He presented last night to the [place] emergency department and was felt to have arterial insufficiency. He did have motor and sensory function and is referred our office this morning. The patient reports although this is been present for 7 weeks has been more progressive over the last 2 weeks. He does report constant pain associated with this. He does have some dusky changes to his foot as well. He specifically denies any prior history of claudication type symptoms. He has no left leg symptoms. His past history is negative for any cardiac disease and negative for any history of stroke her amaurosis fugax or transient ischemic attack. He does have a 35 year history of smoking approximately 2 packs per day  Past Medical History  Diagnosis Date  . Prolapsed, anus     Past Surgical History  Procedure Laterality Date  . Back surgery    . Back surgery    . Colon surgery      History   Social History  . Marital Status: Widowed    Spouse Name: N/A    Number of Children: N/A  . Years of Education: N/A   Occupational History  . Not on file.   Social History Main Topics  . Smoking status: Current Every Day Smoker -- 1.50 packs/day for 35 years    Types:  Cigarettes  . Smokeless tobacco: Not on file  . Alcohol Use: 3.6 oz/week    6 Shots of liquor per week  . Drug Use: No  . Sexually Active: Not on file   Other Topics Concern  . Not on file   Social History Narrative  . No narrative on file    Family History  Problem Relation Age of Onset  . Cancer Mother   . Diabetes Father   . Hyperlipidemia Father   . Hypertension Father   . Heart attack Father     Allergies as of 06/25/2013 - Review Complete 06/25/2013  Allergen Reaction Noted  . Penicillins  06/12/2013    Current Outpatient Prescriptions on File Prior to Visit  Medication Sig Dispense Refill  . diphenhydrAMINE (BENADRYL) 25 MG tablet Take 1 tablet (25 mg total) by mouth every 6 (six) hours as needed for itching (Rash).  30 tablet  0  . oxyCODONE-acetaminophen (PERCOCET/ROXICET) 5-325 MG per tablet Take 1 tablet by mouth every 4 (four) hours as needed for pain.  15 tablet  0  . hydrocortisone 2.5 % lotion Apply topically 2 (two) times daily.  59 mL  0  . Ibuprofen (IBU PO) Take 4 tablets by mouth every 6 (six) hours as needed (pain).      . permethrin (ELIMITE) 5 % cream Apply to entire   body other than face - let sit for 14 hours then wash off, may repeat in 1 week if still having symptoms  60 g  1   No current facility-administered medications on file prior to visit.     REVIEW OF SYSTEMS:  Positives indicated with an "X"  CARDIOVASCULAR:  [ ] chest pain   [ ] chest pressure   [ ] palpitations   [ ] orthopnea   [ ] dyspnea on exertion   [ ] claudication   [ ] rest pain   [ ] DVT   [ ] phlebitis PULMONARY:   [ ] productive cough   [ ] asthma   [ ] wheezing NEUROLOGIC:   [ ] weakness  [ ] paresthesias  [ ] aphasia  [ ] amaurosis  [ ] dizziness HEMATOLOGIC:   [ ] bleeding problems   [ ] clotting disorders MUSCULOSKELETAL:  [ ] joint pain   [ ] joint swelling GASTROINTESTINAL: [ ]  blood in stool  [ ]  hematemesis GENITOURINARY:  [ ]  dysuria  [ ]   hematuria PSYCHIATRIC:  [ ] history of major depression INTEGUMENTARY:  [ ] rashes  [ ] ulcers CONSTITUTIONAL:  [ ] fever   [ ] chills  PHYSICAL EXAMINATION:  General: The patient is a well-nourished male, in no acute distress. Vital signs are BP 123/80  Pulse 80  Ht 6' (1.829 m)  Wt 192 lb (87.091 kg)  BMI 26.03 kg/m2  SpO2 98% Pulmonary: There is a good air exchange bilaterally without wheezing or rales. Abdomen: Soft and non-tender with normal pitch bowel sounds. Musculoskeletal: There are no major deformities.  There is no significant extremity pain. Neurologic: No focal weakness or paresthesias are detected, Skin: There are no ulcer or rashes noted. Psychiatric: The patient has normal affect. Cardiovascular: There is a regular rate and rhythm without significant murmur appreciated. Pulse status: 2+ radial pulses 2+ femoral pulses. He has absent right popliteal and distal pulses. He does have a 2+ left posterior tibial pulse Hand held Doppler shows dampened monophasic flow in his right posterior tibial artery no signal in his dorsalis pedis or peroneal  Impression and Plan:  Critical limb ischemia with progression over the last 7 weeks. He does understand the magnitude of this at this isn't limb threatening. I did have a discussion with him regarding the critical necessity of smoking cessation. He is scheduled for arteriography tomorrow at [facility]. I explained that there is a remote chance that he could have endovascular treatment but in all likelihood would require infrainguinal bypass.    EARLY, [name] [facility] [place] Office: [phone]         

## 2013-06-28 NOTE — Anesthesia Postprocedure Evaluation (Signed)
  Anesthesia Post-op Note  Patient: Cody Baird  Procedure(s) Performed: Procedure(s): BYPASS GRAFT FEMORAL TO TIBIAL PERONEAL TRUNK ARTERY (Right)  Patient Location: PACU  Anesthesia Type:General  Level of Consciousness: awake and alert   Airway and Oxygen Therapy: Patient Spontanous Breathing  Post-op Pain: mild  Post-op Assessment: Post-op Vital signs reviewed, Patient's Cardiovascular Status Stable, Respiratory Function Stable, Patent Airway, No signs of Nausea or vomiting and Pain level controlled  Post-op Vital Signs: stable  Complications: No apparent anesthesia complications

## 2013-06-28 NOTE — Progress Notes (Signed)
Utilization review completed.  

## 2013-06-28 NOTE — Anesthesia Preprocedure Evaluation (Addendum)
Anesthesia Evaluation  Patient identified by MRN, date of birth, ID band Patient awake    Reviewed: Allergy & Precautions, H&P , NPO status , Patient's Chart, lab work & pertinent test results  History of Anesthesia Complications Negative for: history of anesthetic complications  Airway Mallampati: II TM Distance: >3 FB Neck ROM: Full    Dental  (+) Teeth Intact and Dental Advisory Given   Pulmonary shortness of breath, COPDCurrent Smoker,  + rhonchi         Cardiovascular + Peripheral Vascular Disease Rhythm:Regular Rate:Normal     Neuro/Psych Seizures -,  Febrile seizure @ 52 years old    GI/Hepatic negative GI ROS, Neg liver ROS,   Endo/Other  negative endocrine ROS  Renal/GU negative Renal ROS     Musculoskeletal   Abdominal   Peds  Hematology negative hematology ROS (+)   Anesthesia Other Findings   Reproductive/Obstetrics negative OB ROS                          Anesthesia Physical Anesthesia Plan  ASA: III  Anesthesia Plan: General   Post-op Pain Management:    Induction: Intravenous  Airway Management Planned: Oral ETT  Additional Equipment:   Intra-op Plan:   Post-operative Plan: Extubation in OR  Informed Consent: I have reviewed the patients History and Physical, chart, labs and discussed the procedure including the risks, benefits and alternatives for the proposed anesthesia with the patient or authorized representative who has indicated his/her understanding and acceptance.     Plan Discussed with: CRNA and Surgeon  Anesthesia Plan Comments:         Anesthesia Quick Evaluation

## 2013-06-28 NOTE — Op Note (Signed)
OPERATIVE REPORT  DATE OF SURGERY: 06/28/2013  PATIENT: Cody Baird, 52 y.o. male MRN: [id number]  DOB: 05-01-1961  PRE-OPERATIVE DIAGNOSIS: Severe ischemia right foot  POST-OPERATIVE DIAGNOSIS:  Same  PROCEDURE: #1 right common femoral artery endarterectomy, #2 right superficial femoral and popliteal thrombectomy, #3 right femoral to tibioperoneal trunk bypass with translocated reversed great saphenous vein  SURGEON:  Gretta Began, M.D.  PHYSICIAN ASSISTANT: Rhyne  ANESTHESIA:  Gen.  EBL: 100 ml  Total I/O In: 2300 [I.V.:2300] Out: 510 [Urine:410; Blood:100]  BLOOD ADMINISTERED: None  DRAINS: None  SPECIMEN: None  COUNTS CORRECT:  YES  PLAN OF CARE: PACU   PATIENT DISPOSITION:  PACU - hemodynamically stable  PROCEDURE DETAILS: Patient to the operating please placed supine position where the area the right groin right leg were prepped and draped in a sterile fashion SonoSite ultrasound was used to mark the saphenous vein from the groin to the distal calf. The vein was of good caliber. Decision was made over the common femoral artery pulse and carried down to isolate the common superficial and profundus femoris arteries. The saphenous vein was identified through the same incision and was a tributary branches were ligated with 304 0 silk ties and divided. The vein was exposed from the groin to the calf using several skin bridges. The vein was of good caliber throughout its course. 3 or 4-0 silk ties were used for ligation of tributary branches. The below knee popliteal artery was exposed to the vein harvest incision at the medial approach. The artery was of good size. Crossing veins were ligated with 3-0 silk ties and divided them and the anterior tibial artery takeoff was identified at its origin and was encircled with a vessel loop. The tibioperoneal trunk was also encircled with a vessel loop. The vein was harvested from mid calf to the groin. The vein was ligated distally with  2-0 silk tie. At the groin the saphenofemoral junction was occluded with a Cooley clamp and the saphenous vein was excised from the saphenofemoral junction. The saphenofemoral junction was oversewn with a 5-0 Prolene suture.  A tunnel was created from the level of the below-knee popliteal artery to the level of the groin. The patient was given 8000 intravenous heparin. After a circulation time the common superficial femoral and profundus femoris arteries were occluded. The common femoral artery was opened. There was used and draped in the plaque in this area that was partially flow limiting. This appeared to be more intimal hyperplasia that calcified plaque. This was endarterectomized. This did extend down onto the origin of the superficial femoral artery. There was good backbleeding from the profundus femoris artery. A 4 Fogarty catheter was passed down the superficial femoral artery and chronic thrombus was removed. The catheter would not pass approximately 50 cm which corresponded with the at knee popliteal. The vein was brought onto the field and was reversed and was spatulated and sewn into side to the junction of the common and superficial femoral artery with a running 6-0 Prolene suture. Anastomosis was tested and found to be adequate.  The graft was brought to the prior created tunnel taking care not to twist the graft. This was brought into approximation with the below-knee popliteal artery. The below-knee pop to artery was occluded with a Hanley clamp and the anterior tibial and tibioperoneal trunks were occluded with vessel loops. The below-knee popliteal artery was opened and this was extended onto the junction of the tibioperoneal trunk. There was thrombus at this area. This  was removed and there was good backbleeding from the anterior tibial and tibioperoneal trunk. There was no inflow through the popliteal proximally. A 4 Fogarty cath was passed retrograde through the popliteal up into the  superficial femoral artery and further clot was removed. There did appear to be narrowing at the level of the abductor canal. This did restore good flow through the native artery. Artery was reoccluded. Next the vein was cut to appropriate length and spatulated and sewn end-to-side to the junction of the below-knee popliteal artery onto the tibioperoneal trunk. To completion of the anastomosis the usual flushing maneuvers were undertaken. After completion anastomosis clamps removed and there was good posterior tibial pulse at the ankle. The patient was given 50 mg of protamine to reverse the heparin. Wound irrigated with saline. Hemostasis daily cautery. Wounds were closed with 2-0 Vicryl in the subcutaneous and cutaneous tissue and fascia the groins the vein harvest incisions were closed with 304 0 Vicryl suture in the subcutaneous and subcuticular tissue. The groin and popliteal incisions were closed with a subcuticular Vicryl sutures. Sterile dressing was applied the patient was taken to the recovery in stable condition   Gretta Began, M.D. 06/28/2013 12:41 PM

## 2013-06-28 NOTE — Progress Notes (Signed)
ANTIBIOTIC CONSULT NOTE - INITIAL  Pharmacy Consult for Vancomycin Indication: post-op prophylaxis  Allergies  Allergen Reactions  . Penicillins     Patient Measurements: Height: 6' (182.9 cm) Weight: 192 lb 0.3 oz (87.1 kg) IBW/kg (Calculated) : 77.6  Vital Signs: Temp: 98 F (36.7 C) (08/15 1330) Temp src: Oral (08/15 0634) BP: 149/92 mmHg (08/15 1330) Pulse Rate: 103 (08/15 1345) Intake/Output from previous day:   Intake/Output from this shift: Total I/O In: 2300 [I.V.:2300] Out: 510 [Urine:410; Blood:100]  Labs:  Recent Labs  06/26/13 1137 06/26/13 1144 06/26/13 1704  WBC  --   --  7.7  HGB 18.7* 18.4* 17.5*  PLT  --   --  204  CREATININE  --  0.80 0.89   Estimated Creatinine Clearance: 107.8 ml/min (by C-G formula based on Cr of 0.89).    Microbiology: Recent Results (from the past 720 hour(s))  SURGICAL PCR SCREEN     Status: Abnormal   Collection Time    06/26/13  4:48 PM      Result Value Range Status   MRSA, PCR NEGATIVE  NEGATIVE Final   Staphylococcus aureus POSITIVE (*) NEGATIVE Final   Comment:            The Xpert SA Assay (FDA     approved for NASAL specimens     in patients over 51 years of age),     is one component of     a comprehensive surveillance     program.  Test performance has     been validated by [employer] for patients greater 51     than or equal to 51 year old.     It is not intended     to diagnose infection nor to     guide or monitor treatment.    Medical History: Past Medical History  Diagnosis Date  . Prolapsed, anus   . Peripheral vascular disease   . PAD (peripheral artery disease)   . Shortness of breath     with exertion    Medications:  Prescriptions prior to admission  Medication Sig Dispense Refill  . diphenhydrAMINE (BENADRYL) 25 mg capsule Take 25 mg by mouth every 6 (six) hours as needed for itching.      Marland Kitchen HYDROcodone-acetaminophen (NORCO/VICODIN) 5-325 MG per tablet Take 1 tablet by mouth  every 6 (six) hours as needed for pain.  10 tablet  0  . mupirocin ointment (BACTROBAN) 2 % 1 Application, nasally, 2 times daily prior to surgery  22 g  0  . oxyCODONE (OXY IR/ROXICODONE) 5 MG immediate release tablet Take 5 mg by mouth every 6 (six) hours as needed for pain.      Marland Kitchen permethrin (ELIMITE) 5 % cream Apply 1 application topically once. *apply to entire body except for the face, let sit for 14 hours, then wash off. May repeat in 1 week if still having symptoms      . Pramoxine-Calamine (AVEENO ANTI-ITCH) 1-3 % CREA Apply 1 application topically 2 (two) times daily as needed (for itching).       Assessment: 52 yo M s/p peripheral bypass for severe ischemia to R foot.  Pt received Vancomycin 1gm IV pre-op ~ 0800 this morning.  CrCl > 100 ml/hr.    Goal of Therapy:  Vancomycin trough level 10-15 mcg/ml  Plan:  Vancomycin 1gm IV q12h - dose appropriate based on weight and renal function.  Continue post-op doses as ordered.  Rx will  sign off.  [employer], Pharm.D., BCPS Clinical Pharmacist Pager [phone] 06/28/2013 2:59 PM

## 2013-06-28 NOTE — Interval H&P Note (Signed)
History and Physical Interval Note:  06/28/2013 7:19 AM  Cody Baird  has presented today for surgery, with the diagnosis of PVD WITH ULCER  The various methods of treatment have been discussed with the patient and family. After consideration of risks, benefits and other options for treatment, the patient has consented to  Procedure(s): BYPASS GRAFT FEMORAL-TIBIAL ARTERY (Right) as a surgical intervention .  The patient's history has been reviewed, patient examined, no change in status, stable for surgery.  I have reviewed the patient's chart and labs.  Questions were answered to the patient's satisfaction.     Kweli Grassel

## 2013-06-28 NOTE — Anesthesia Procedure Notes (Signed)
Procedure Name: Intubation Date/Time: 06/28/2013 8:02 AM Performed by: Lovie Chol Pre-anesthesia Checklist: Patient identified, Emergency Drugs available, Suction available, Patient being monitored and Timeout performed Patient Re-evaluated:Patient Re-evaluated prior to inductionOxygen Delivery Method: Circle system utilized Preoxygenation: Pre-oxygenation with 100% oxygen Intubation Type: IV induction Ventilation: Mask ventilation without difficulty Laryngoscope Size: Miller and 3 Grade View: Grade I Tube type: Oral Tube size: 7.5 mm Number of attempts: 1 Airway Equipment and Method: Stylet Placement Confirmation: ETT inserted through vocal cords under direct vision,  positive ETCO2,  CO2 detector and breath sounds checked- equal and bilateral Secured at: 23 cm Tube secured with: Tape Dental Injury: Teeth and Oropharynx as per pre-operative assessment

## 2013-06-28 NOTE — Progress Notes (Signed)
  Echocardiogram 2D Echocardiogram has been performed.  Cody Baird [name] 06/28/2013, 7:07 PM

## 2013-06-29 DIAGNOSIS — M79609 Pain in unspecified limb: Secondary | ICD-10-CM

## 2013-06-29 LAB — CBC
MCH: 34 pg (ref 26.0–34.0)
MCHC: 34.1 g/dL (ref 30.0–36.0)
MCV: 99.6 fL (ref 78.0–100.0)
Platelets: 165 10*3/uL (ref 150–400)
RDW: 13.3 % (ref 11.5–15.5)

## 2013-06-29 LAB — BASIC METABOLIC PANEL
BUN: 6 mg/dL (ref 6–23)
Calcium: 8.9 mg/dL (ref 8.4–10.5)
Creatinine, Ser: 0.67 mg/dL (ref 0.50–1.35)
GFR calc Af Amer: >90 mL/min (ref >90)

## 2013-06-29 NOTE — Progress Notes (Addendum)
[facility] [place]  Progress Note Bypass Surgery  Date of Surgery: 06/28/2013 PROCEDURE: #1 right common femoral artery endarterectomy, #2 right superficial femoral and popliteal thrombectomy, #3 right femoral to tibioperoneal trunk bypass with translocated reversed great saphenous vein  Surgeon: Surgeon(s): Larina Earthly, MD  1 Day Post-Op  History of Present Illness  Cody Baird is a 52 y.o. male who is  1 Day Post-Op. The patient's pre-op symptoms of pain are Improved . Patients pain is well controlled.  Third toe cyanotic.  VASC. LAB Studies:        ABI: pend   Imaging: 2D echo report pending  Significant Diagnostic Studies: CBC Lab Results  Component Value Date   WBC 12.6* 06/29/2013   HGB 15.8 06/29/2013   HCT 46.3 06/29/2013   MCV 99.6 06/29/2013   PLT 165 06/29/2013    BMET    Component Value Date/Time   NA 134* 06/29/2013 0440   K 3.6 06/29/2013 0440   CL 101 06/29/2013 0440   CO2 22 06/29/2013 0440   GLUCOSE 112* 06/29/2013 0440   BUN 6 06/29/2013 0440   CREATININE 0.67 06/29/2013 0440   CALCIUM 8.9 06/29/2013 0440   GFRNONAA >90 06/29/2013 0440   GFRAA >90 06/29/2013 0440    COAG Lab Results  Component Value Date   INR 0.98 06/26/2013   No results found for this basename: PTT    Physical Examination  BP Readings from Last 3 Encounters:  06/29/13 133/78  06/29/13 133/78  06/26/13 153/89   Temp Readings from Last 3 Encounters:  06/29/13 99.3 F (37.4 C) Oral  06/29/13 99.3 F (37.4 C) Oral  06/26/13 97.8 F (36.6 C) Oral   SpO2 Readings from Last 3 Encounters:  06/29/13 93%  06/29/13 93%  06/26/13 99%   Pulse Readings from Last 3 Encounters:  06/29/13 66  06/29/13 66  06/26/13 66    Pt is A&O x 3 right lower extremity: Incision/s is/are clean,dry.intact, and  healing without hematoma, erythema or drainage Limb is warm; with good color. Foot pink; 3rd toe cyanotic  Right Dorsalis Pedis pulse is monophasic by  Doppler Right Posterior tibial pulse is  monophasic by Doppler  Assessment/Plan: Pt. Doing well Post-op pain is controlled Wounds are healing well PT/OT for ambulation Continue wound care as ordered Transfer to 2000  Cody Baird  06/29/2013 8:47 AM        I have examined the patient, reviewed and agree with above.Palp pt pulse.  Toes demarkating  Cody Tilley, MD 06/29/2013 9:07 AM

## 2013-06-29 NOTE — Progress Notes (Signed)
VASCULAR LAB PRELIMINARY  ARTERIAL  ABI completed:    RIGHT    LEFT    PRESSURE WAVEFORM  PRESSURE WAVEFORM  BRACHIAL  triphasic BRACHIAL 147 triphasic  DP   DP    AT 95 monophasic AT 160 Dampened monophasic  PT 109 monophasic PT 172 triphasic  PER   PER    GREAT TOE  NA GREAT TOE  NA    RIGHT LEFT  ABI 0.74 >1.0     Eliberto Sole, RVT 06/29/2013, 10:21 AM

## 2013-06-29 NOTE — Evaluation (Signed)
Physical Therapy Evaluation Patient Details Name: Cody Baird MRN: [id number] DOB: 29-Mar-1961 Today's Date: 06/29/2013 Time: 2952-8413 PT Time Calculation (min): 21 min  PT Assessment / Plan / Recommendation History of Present Illness  Patient is a 52 y.o. male with h/o 2 back surgeries and tobacco use.  He presented with right LE pain over 7 week period.  Now s/p right femoral artery endarterectomy, superficial femoral and popliteal thrombectomy and right femoral to tibial bypass.  Clinical Impression  Patient presents with limitations in mobility due to deficits listed below.  Feel he will benefit from skilled PT in the acute setting to allow pt to return home with intermittent help from friends and HHPT.    PT Assessment  Patient needs continued PT services    Follow Up Recommendations  Home health PT;Supervision - Intermittent    Does the patient have the potential to tolerate intense rehabilitation    N/A  Barriers to Discharge  None      Equipment Recommendations  Rolling walker with 5" wheels    Recommendations for Other Services   None  Frequency Min 3X/week    Precautions / Restrictions Precautions Precautions: Fall Precaution Comments: due to painful right LE keeping foot NWB with ambulation   Pertinent Vitals/Pain Pain 10/10 with dependency of right LE      Mobility  Bed Mobility Bed Mobility: Sit to Supine Sit to Supine: 5: Supervision Details for Bed Mobility Assistance: for safety, assist for set up to avoid anything touching right foot due to pain Transfers Transfers: Sit to Stand;Stand to Sit Sit to Stand: From chair/3-in-1;With upper extremity assist;With armrests;5: Supervision Stand to Sit: To chair/3-in-1;To bed;With armrests;With upper extremity assist;5: Supervision Details for Transfer Assistance: supervision for safety and environmental set up; demonstrates heavy UE use due to unable to tolerate right foot on  floor Ambulation/Gait Ambulation/Gait Assistance: 4: Min guard Ambulation Distance (Feet): 25 Feet Assistive device: Rolling walker Ambulation/Gait Assistance Details: hopped to door and back, limited due to pain and noted increased erythema with dependency  Gait Pattern: Trunk flexed;Decreased stride length        PT Diagnosis: Difficulty walking;Acute pain  PT Problem List: Decreased strength;Decreased activity tolerance;Decreased balance;Decreased mobility;Pain PT Treatment Interventions: DME instruction;Gait training;Functional mobility training;Therapeutic activities;Therapeutic exercise;Patient/family education     PT Goals(Current goals can be found in the care plan section) Acute Rehab PT Goals Patient Stated Goal: to return to independent PT Goal Formulation: With patient Time For Goal Achievement: 07/06/13 Potential to Achieve Goals: Good  Visit Information  Last PT Received On: 06/29/13 Assistance Needed: +1 History of Present Illness: Patient is a 51 y.o. male with h/o 2 back surgeries and tobacco use.  He presented with right LE pain over 7 week period.  Now s/p right femoral artery endarterectomy, superficial femoral and popliteal thrombectomy and right femoral to tibial bypass.       Prior Functioning  Home Living Family/patient expects to be discharged to:: Private residence Living Arrangements: Alone Available Help at Discharge: Friend(s);Available PRN/intermittently Type of Home: Apartment Home Access: Level entry Home Layout: One level Home Equipment: Crutches Prior Function Level of Independence: Independent with assistive device(s) Comments: was using crutches last few weeks, reports thought pain coming from back; reports was unsteady on crutches; works owns a [employer], forensic: No difficulties Dominant Hand: Right    Cognition  Cognition Arousal/Alertness: Awake/alert Behavior During Therapy: WFL for tasks assessed/performed Overall  Cognitive Status: Within Functional Limits for tasks assessed    Extremity/Trunk Assessment  Lower Extremity Assessment Lower Extremity Assessment: RLE deficits/detail RLE Deficits / Details: keeps leg externally rotated and flexed at rest, lifts antigravity with diffiiculty, moves ankle WFL with edema and massive color change with dependency foot turning red.   Balance Balance Balance Assessed: Yes Static Standing Balance Static Standing - Balance Support: Bilateral upper extremity supported Static Standing - Level of Assistance: 5: Stand by assistance Static Standing - Comment/# of Minutes: right LE NWB  End of Session PT - End of Session Equipment Utilized During Treatment: Gait belt Activity Tolerance: Patient tolerated treatment well Patient left: in bed;with call bell/phone within reach  GP     [facility] 06/29/2013, 2:38 PM [place], PT [phone] 06/29/2013

## 2013-06-30 MED ORDER — PREGABALIN 50 MG PO CAPS
50.0000 mg | ORAL_CAPSULE | Freq: Two times a day (BID) | ORAL | Status: DC
  Administered 2013-06-30 – 2013-07-01 (×3): 50 mg via ORAL
  Filled 2013-06-30 (×3): qty 1

## 2013-06-30 NOTE — Progress Notes (Addendum)
[facility] [place]  Progress Note Bypass Surgery  Date of Surgery: 06/28/2013  Procedure(s): RIGHT BYPASS GRAFT FEMORAL TO TIBIAL PERONEAL TRUNK ARTERY Surgeon: Surgeon(s): Larina Earthly, MD  2 Days Post-Op  History of Present Illness  Cody Baird is a 52 y.o. male who is  2 Days Post-Op. The patient's pre-op symptoms of pain in foot are Unchanged . " My foot burns" Patients pain is not well controlled.  Pt has been on multiple pain meds with previous back surgeries  VASC. LAB Studies:        ABI: Right 0.74;  Left >1;   Physical Examination  BP Readings from Last 3 Encounters:  06/30/13 147/93  06/30/13 147/93  06/26/13 153/89   Temp Readings from Last 3 Encounters:  06/30/13 98.5 F (36.9 C) Oral  06/30/13 98.5 F (36.9 C) Oral  06/26/13 97.8 F (36.6 C) Oral   SpO2 Readings from Last 3 Encounters:  06/30/13 95%  06/30/13 95%  06/26/13 99%   Pulse Readings from Last 3 Encounters:  06/30/13 89  06/30/13 89  06/26/13 66   Pt is A&O x 3 right lower extremity: Incision/s is/are clean,dry.intact, and  healing without hematoma, positive  Erythema in toes - unchanged no drainage Limb is warm; with good color 3rd toe tip demarcation  Right Posterior tibial pulse is  palpable  Assessment/Plan: Pt. Doing well Post-op incisional pain is controlled Neuropathic pain in right foot not well controlled - will add Lyrica Wounds are healing well PT/OT for ambulation Continue wound care as ordered  ROCZNIAK,REGINA J  06/30/2013 9:05 AM   I have examined the patient, reviewed and agree with above. Patient has a palpable posterior tibial pulse. Graft patent. Continues to have significant pain in his foot preoperative severe ischemia. Will discharge when able to have pain control and mobilize.  Devora Tortorella, MD 06/30/2013 9:24 AM

## 2013-06-30 NOTE — Progress Notes (Signed)
Physical Therapy Treatment Patient Details Name: Cody Baird MRN: [id number] DOB: 09/10/1961 Today's Date: 06/30/2013 Time: 4098-1191 PT Time Calculation (min): 16 min  PT Assessment / Plan / Recommendation  History of Present Illness Patient is a 52 y.o. male with h/o 2 back surgeries and tobacco use.  He presented with right LE pain over 7 week period.  Now s/p right femoral artery endarterectomy, superficial femoral and popliteal thrombectomy and right femoral to tibial bypass.   PT Comments   OOB mobility greatly limited by pain in R LE. Pt unable to WB thru R LE. Questionable if pt can care for self upon d/c. Will con't to monitor progression of mobility.   Follow Up Recommendations  Home health PT;Supervision - Intermittent     Does the patient have the potential to tolerate intense rehabilitation     Barriers to Discharge        Equipment Recommendations  Rolling walker with 5" wheels    Recommendations for Other Services    Frequency Min 3X/week   Progress towards PT Goals Progress towards PT goals: Progressing toward goals  Plan Current plan remains appropriate    Precautions / Restrictions Precautions Precautions: Fall Restrictions Weight Bearing Restrictions: No Other Position/Activity Restrictions: pt self R LE NWB due to pain   Pertinent Vitals/Pain 12/10 R LE pain    Mobility  Bed Mobility Bed Mobility: Supine to Sit;Sit to Supine Supine to Sit: 6: Modified independent (Device/Increase time);HOB elevated Sit to Supine: 6: Modified independent (Device/Increase time);HOB elevated Transfers Transfers: Sit to Stand;Stand to Sit Sit to Stand: 4: Min guard;With upper extremity assist;From bed Stand to Sit: 4: Min guard;With upper extremity assist;To bed Details for Transfer Assistance: v/c's for safe hand placement, pt R LE NWB, dep. on bilat UE Ambulation/Gait Ambulation/Gait Assistance: 4: Min guard Ambulation Distance (Feet): 20 Feet Assistive device:  Rolling walker Ambulation/Gait Assistance Details: pt began bleeding from surigical site. RN notified and came to assess situation. Gait Pattern: Step-to pattern Gait velocity: slow General Gait Details: pt R LE NWB. pt R LE with increased errythema. pt c/o "It's a 12/10 pain, throbbing and on fire." Stairs: No    Exercises     PT Diagnosis:    PT Problem List:   PT Treatment Interventions:     PT Goals (current goals can now be found in the care plan section)    Visit Information  Last PT Received On: 06/30/13 Assistance Needed: +1 History of Present Illness: Patient is a 52 y.o. male with h/o 2 back surgeries and tobacco use.  He presented with right LE pain over 7 week period.  Now s/p right femoral artery endarterectomy, superficial femoral and popliteal thrombectomy and right femoral to tibial bypass.    Subjective Data  Subjective: pt reports "My foot is on fire."   Cognition  Cognition Arousal/Alertness: Awake/alert Behavior During Therapy: WFL for tasks assessed/performed Overall Cognitive Status: Within Functional Limits for tasks assessed    Balance     End of Session PT - End of Session Equipment Utilized During Treatment: Gait belt Activity Tolerance: Patient limited by pain (and bleeding at surgical site) Patient left: in bed;with call bell/phone within reach Nurse Communication: Mobility status (bleeding from surgical site)   GP     Marcene Brawn 06/30/2013, 2:40 PM  Lewis Shock, PT, DPT Pager #: [phone] Office #: [phone]

## 2013-07-01 ENCOUNTER — Encounter (HOSPITAL_COMMUNITY): Payer: Self-pay | Admitting: Vascular Surgery

## 2013-07-01 MED ORDER — ATORVASTATIN CALCIUM 10 MG PO TABS: 10.0000 mg | ORAL_TABLET | Freq: Every day | ORAL | Status: DC

## 2013-07-01 MED ORDER — HYDROMORPHONE HCL 2 MG PO TABS
2.0000 mg | ORAL_TABLET | ORAL | Status: DC | PRN
  Administered 2013-07-01: 4 mg via ORAL
  Filled 2013-07-01: qty 2

## 2013-07-01 MED ORDER — HYDROMORPHONE HCL 2 MG PO TABS: 2.0000 mg | ORAL_TABLET | ORAL | Status: DC | PRN

## 2013-07-01 MED ORDER — PREGABALIN 50 MG PO CAPS: 50.0000 mg | ORAL_CAPSULE | Freq: Two times a day (BID) | ORAL | Status: DC

## 2013-07-01 MED ORDER — ATORVASTATIN CALCIUM 10 MG PO TABS
10.0000 mg | ORAL_TABLET | Freq: Every day | ORAL | Status: DC
  Filled 2013-07-01: qty 1

## 2013-07-01 NOTE — Progress Notes (Signed)
Physical Therapy Treatment Patient Details Name: STEEN BISIG MRN: [id number] DOB: 1961-07-01 Today's Date: 07/01/2013 Time: 3086-5784 PT Time Calculation (min): 30 min  PT Assessment / Plan / Recommendation  History of Present Illness Patient is a 52 y.o. male with h/o 2 back surgeries and tobacco use.  He presented with right LE pain over 7 week period.  Now s/p right femoral artery endarterectomy, superficial femoral and popliteal thrombectomy and right femoral to tibial bypass.   PT Comments   Pt able to progress with ambulation today but still limited by burning pain to Right foot. Pt educated for need to progress knee extension and dorsiflexion as well as sequence to progress gait from TDWB to full weight bearing of RLE as pt able to tolerate. Pt unable to receive HHPT and educated for all needs prior to discharge including safety awareness with gait.   Follow Up Recommendations        Does the patient have the potential to tolerate intense rehabilitation     Barriers to Discharge        Equipment Recommendations       Recommendations for Other Services    Frequency     Progress towards PT Goals Progress towards PT goals: Progressing toward goals  Plan Current plan remains appropriate    Precautions / Restrictions Precautions Precautions: Fall Precaution Comments: due to painful right LE keeping foot NWB with ambulation   Pertinent Vitals/Pain 5/10 right foot pain at rest 7/10 "" with activity    Mobility  Bed Mobility Supine to Sit: 6: Modified independent (Device/Increase time);HOB flat Transfers Sit to Stand: 5: Supervision;From bed;From chair/3-in-1;With armrests Stand to Sit: 5: Supervision;To chair/3-in-1;To bed;With armrests Details for Transfer Assistance: cueing for hand placement Ambulation/Gait Ambulation/Gait Assistance: 5: Supervision Ambulation Distance (Feet): 50 Feet Assistive device: Rolling walker Ambulation/Gait Assistance Details: cueing for  sequence as pt only able to touchdown with heel on RLE and limited distance due to pain Gait Pattern: Step-to pattern Gait velocity: decreased General Gait Details: Pt able to place RLE down to the ground but with heel only. Required toes cut out of sock prior to ambulation because of pain with touch to toes Stairs: Yes Stairs Assistance: 5: Supervision Stairs Assistance Details (indicate cue type and reason): pt slightly impulsive with stairs and educated for safety but performed without LOB Stair Management Technique: Two rails;Step to pattern;Forwards Number of Stairs: 2    Exercises General Exercises - Lower Extremity Ankle Circles/Pumps: AROM;10 reps;Right;Seated Long Arc Quad: AROM;Right;10 reps;Seated   PT Diagnosis:    PT Problem List:   PT Treatment Interventions:     PT Goals (current goals can now be found in the care plan section)    Visit Information  Last PT Received On: 07/01/13 Assistance Needed: +1 History of Present Illness: Patient is a 52 y.o. male with h/o 2 back surgeries and tobacco use.  He presented with right LE pain over 7 week period.  Now s/p right femoral artery endarterectomy, superficial femoral and popliteal thrombectomy and right femoral to tibial bypass.    Subjective Data      Cognition  Cognition Arousal/Alertness: Awake/alert Behavior During Therapy: WFL for tasks assessed/performed Overall Cognitive Status: Within Functional Limits for tasks assessed    Balance     End of Session PT - End of Session Equipment Utilized During Treatment: Gait belt Activity Tolerance: Patient limited by pain Patient left: in chair;with call bell/phone within reach;with family/visitor present Nurse Communication: Mobility status   GP  Toney Sang Beth 07/01/2013, 12:24 PM Delaney Meigs, PT [phone]

## 2013-07-01 NOTE — Care Management Note (Signed)
    Page 1 of 1   07/01/2013     4:40:23 PM   CARE MANAGEMENT NOTE 07/01/2013  Patient:  Cody Baird, Cody Baird   Account Number:  [id number]  Date Initiated:  07/01/2013  Documentation initiated by:  Erlene Devita  Subjective/Objective Assessment:   PT S/P RT FEM TIB ON 8/15.  PTA, PT LIVES ALONE, AND IS INDEPENDENT.     Action/Plan:   PT STATES HE HAS WALKER HE CAN BORROW TO USE AT HOME. UNFORTUNATELY PT NOT ELIGIBLE FOR HHPT, AS HE IS UNINSURED. PT DENIES ANY OTHER NEEDS FOR HOME.   Anticipated DC Date:  07/01/2013   Anticipated DC Plan:  HOME/SELF CARE      DC Planning Services  CM consult      Choice offered to / List presented to:             Status of service:  Completed, signed off Medicare Important Message given?   (If response is "NO", the following Medicare IM given date fields will be blank) Date Medicare IM given:   Date Additional Medicare IM given:    Discharge Disposition:  HOME/SELF CARE  Per UR Regulation:  Reviewed for med. necessity/level of care/duration of stay  If discussed at Long Length of Stay Meetings, dates discussed:    Comments:

## 2013-07-01 NOTE — Progress Notes (Signed)
PT DISCHARGED HOME WITH MOTHER. DISCHARGE INSTRUCTIONS REVIEWED AND ALONG WITH PRESCRIPTIONS GIVEN. PT INSTRUCTED ON SMOKING CESSATION AND GIVEN INFO ON PCP. PT VU. NO DISTRESS NOTED.

## 2013-07-04 ENCOUNTER — Telehealth: Payer: Self-pay

## 2013-07-04 DIAGNOSIS — G8918 Other acute postprocedural pain: Secondary | ICD-10-CM

## 2013-07-04 MED ORDER — HYDROCODONE-ACETAMINOPHEN 5-325 MG PO TABS: 2.0000 | ORAL_TABLET | Freq: Four times a day (QID) | ORAL | Status: DC | PRN

## 2013-07-04 NOTE — Telephone Encounter (Signed)
Pt's mother called to report pt. c/o pain in right foot and almost out of pain medication. Per Dr. Arbie Cookey, pt. is expected to have a fair amt. Of pain in his toes of right foot, due to ischemia.  Recommends to give Hydrocodone 5/325 mg per standing order; authorizes to give # 30 with no refills.  Ret'd call to pt's mother.  Informed that nurse will phone in Hydrocodone/ Acetaminophen, for pain, to pharmacy, as unable to phone in current medication, Diluadid, due to the level of narcotic. Mother verb. Understanding.  Mother reported that pt. has a "clear blister" on right great toe, that developed since he came home from hospital.  Advised to have pt. keep blister clean and dry, and protect from bumping or injury to it.  Strongly advised against attempting to open blister, due to a potential source of infection.  Mother verb. understanding of instructions.  Encouraged to call office to report any concerns.

## 2013-07-08 ENCOUNTER — Telehealth: Payer: Self-pay

## 2013-07-08 DIAGNOSIS — G8918 Other acute postprocedural pain: Secondary | ICD-10-CM

## 2013-07-08 MED ORDER — HYDROCODONE-ACETAMINOPHEN 5-325 MG PO TABS: ORAL_TABLET | ORAL | Status: DC

## 2013-07-08 NOTE — Telephone Encounter (Signed)
Phone call from pt.  C/o pain in right foot toes, top of foot, and arch of foot.  Rates pain at "7-8".  States the clear blister on his big toe, that was reported by his mother on Thursday, 8/21, has now turned into a "blood blister".  States the blister on the right great toe and 4 th toe are intact.  Reports right leg incisions are intact, no redness/ inflammation  or drainage.  Denies fever/ chills.  Denies swelling of right lower extremity.  Advised will phone in Hydrocodone/ Acetaminophen for pt.  Encouraged to continue to protect right foot toes with padded dressing, and to avoid opening blisters, to prevent infection.  Has f/u appt. 07/16/13.  Verb. Understanding.

## 2013-07-11 ENCOUNTER — Telehealth: Payer: Self-pay

## 2013-07-11 DIAGNOSIS — G8918 Other acute postprocedural pain: Secondary | ICD-10-CM

## 2013-07-11 MED ORDER — HYDROCODONE-ACETAMINOPHEN 5-325 MG PO TABS
ORAL_TABLET | ORAL | Status: DC
Start: 2013-07-11 — End: 2013-07-23

## 2013-07-11 NOTE — Telephone Encounter (Signed)
Phone call from pt. with request of refill of pain medication.  States has a "burning in the toes, and in the top and arch of right foot".  Rates pain at "8"/ 10.  Discussed w/ Dr. Arbie Cookey.  Rec'd v.o. for Hydrocodone/ Acetaminophen 5/325 mg  take 1-2 tabs q 6 hrs / prn, # 40; no refills.  Pt. has a f/u appt. 07/16/13.

## 2013-07-12 ENCOUNTER — Encounter: Payer: Self-pay | Admitting: Vascular Surgery

## 2013-07-16 ENCOUNTER — Encounter: Payer: Self-pay | Admitting: Vascular Surgery

## 2013-07-16 ENCOUNTER — Ambulatory Visit (INDEPENDENT_AMBULATORY_CARE_PROVIDER_SITE_OTHER): Payer: Self-pay | Admitting: Vascular Surgery

## 2013-07-16 VITALS — BP 115/74 | HR 89 | Resp 18 | Ht 72.0 in | Wt 186.1 lb

## 2013-07-16 DIAGNOSIS — G8918 Other acute postprocedural pain: Secondary | ICD-10-CM

## 2013-07-16 DIAGNOSIS — I7025 Atherosclerosis of native arteries of other extremities with ulceration: Secondary | ICD-10-CM

## 2013-07-16 DIAGNOSIS — I739 Peripheral vascular disease, unspecified: Secondary | ICD-10-CM

## 2013-07-16 DIAGNOSIS — L98499 Non-pressure chronic ulcer of skin of other sites with unspecified severity: Secondary | ICD-10-CM

## 2013-07-16 HISTORY — DX: Other acute postprocedural pain: G89.18

## 2013-07-16 HISTORY — DX: Peripheral vascular disease, unspecified: I73.9

## 2013-07-16 HISTORY — DX: Atherosclerosis of native arteries of other extremities with ulceration: I70.25

## 2013-07-16 HISTORY — DX: Peripheral vascular disease, unspecified: L98.499

## 2013-07-16 NOTE — Progress Notes (Signed)
[facility] OFFICE NOTE  CC:  F/u for surgery 2 HPI:  This is a 52 y.o. male who is s/p #1: right CFA endarterectomy, #2 right SFA and popliteal thrombectomy, #3 right femoral to tibioperoneal trunk bypass with translocated reversed GSV on 06/28/13.  He returns today with complaints of severe pain in his right foot.  He states that he cannot get any rest.    Allergies  Allergen Reactions  . Penicillins     Current Outpatient Prescriptions  Medication Sig Dispense Refill  . atorvastatin (LIPITOR) 10 MG tablet Take 1 tablet (10 mg total) by mouth daily at 6 PM.  60 tablet  3  . diphenhydrAMINE (BENADRYL) 25 mg capsule Take 25 mg by mouth every 6 (six) hours as needed for itching.      Marland Kitchen HYDROcodone-acetaminophen (NORCO/VICODIN) 5-325 MG per tablet Take 1-2 tabs q 6 hrs/ prn  40 tablet  0  . HYDROmorphone (DILAUDID) 2 MG tablet Take 1-2 tablets (2-4 mg total) by mouth every 4 (four) hours as needed.  30 tablet  0  . pregabalin (LYRICA) 50 MG capsule Take 1 capsule (50 mg total) by mouth 2 (two) times daily.  60 capsule  0  . mupirocin ointment (BACTROBAN) 2 % 1 Application, nasally, 2 times daily prior to surgery  22 g  0  . permethrin (ELIMITE) 5 % cream Apply 1 application topically once. *apply to entire body except for the face, let sit for 14 hours, then wash off. May repeat in 1 week if still having symptoms      . Pramoxine-Calamine (AVEENO ANTI-ITCH) 1-3 % CREA Apply 1 application topically 2 (two) times daily as needed (for itching).       No current facility-administered medications for this visit.     ROS:  See HPI  Physical Exam:  Filed Vitals:   07/16/13 1249  BP: 115/74  Pulse: 89  Resp: 18    Incision:  Right groin incision is c/d/i without hematoma; other saphenous vein harvest sites are c/d/i. Extremities:  Right foot is warm; there is a palpable right popliteal graft pulse.  There is a doppler signal in the right PT.  He does have  peeling on the anterior portion of the right foot.  He has gangrenous changes to toes 1-4 on the right.   A/P:  This is a 52 y.o. male here for f/u to his right femoral to tibioperoneal trunk bypass grafting.  -pt is still having increased pain in his right foot and this is expected and may take several weeks to improve -it is explained to him that he may lose his toe nails and that the gangrenous portions of his toes should heal over time.  Throughout this process, he may have some wounds that could require wound care.   -he is given an Rx for Percocet 5/325 1-2 po q4-6hr prn pain #40 (forty) NR and neurontin 300 mg one po tid #90 2RF -he will return to see Dr. Arbie Cookey in 2 months.  He will contact us if he has any issues prior to that visit.  Doreatha Massed, PA-C [facility] [phone]  Clinic MD:  Pt seen and examined with Dr. Arbie Cookey  I have examined the patient, reviewed and agree with above. Severe pain associated with profound preoperative prolonged ischemia. We'll add Neurontin to help Nedra Hai improve his symptoms. Discuss this with the patient and his mother present. We'll see him again in 2 months. We'll continue  to have demarcation of the tips of his toes  EARLY, [name], MD 07/16/2013 1:26 PM

## 2013-07-23 ENCOUNTER — Telehealth: Payer: Self-pay

## 2013-07-23 DIAGNOSIS — I70229 Atherosclerosis of native arteries of extremities with rest pain, unspecified extremity: Secondary | ICD-10-CM

## 2013-07-23 MED ORDER — HYDROCODONE-ACETAMINOPHEN 5-325 MG PO TABS: 1.0000 | ORAL_TABLET | Freq: Four times a day (QID) | ORAL | Status: DC | PRN

## 2013-07-23 NOTE — Telephone Encounter (Signed)
Pt. Call to request refill on pain medication.  Stated at the worst, his pain in right foot is at "7-8"; "this is usually at night".  Reports he noticed some improvement last Friday; the top of his foot was very tender, and now notices it is less sore.  Questioned how pt. Is taking pain medication.  States he take 1 tablet, and in 1-1 1/2 hrs if pain isn't better, he will take another half of tablet.  Discussed using the narcotic pain medication only when it is very painful, due to the concern of getting addicted to it.  Pt. Stated he understood, and wants to get off of it as soon as his pain goes away.  Also states the bottom of his foot is very dry; requested advice on moisturizing his foot.  Recommended to apply vaseline intensive care lotion to the "healthy, dry skin, and not the necrotic tissue.  Verb. Understanding.   Advised will call Rx to pharmacy for Norco 5/325 mg per standing order.  Agrees w/ plan.

## 2013-07-24 NOTE — Discharge Summary (Signed)
[facility] Discharge Summary   Patient ID:  [name] MRN: [id number] DOB/AGE: 1961-06-26 52 y.o.  Admit date: 06/28/2013 Discharge date: 07/24/2013 Date of Surgery: 06/28/2013 Surgeon: Surgeon(s): Larina Earthly, MD  Admission Diagnosis: PVD WITH ULCER  Discharge Diagnoses:  PVD WITH ULCER  Secondary Diagnoses: Past Medical History  Diagnosis Date  . Prolapsed, anus   . Peripheral vascular disease   . PAD (peripheral artery disease)   . Shortness of breath     with exertion    Procedure(s):06/28/13 #1 right common femoral artery endarterectomy, #2 right superficial femoral and popliteal thrombectomy, #3 right femoral to tibioperoneal trunk bypass with translocated reversed great saphenous vein   Discharged Condition: good  HPI: SCHNEUR CROWSON is a 52 y.o. male regarding severe peripheral arterial disease in his right foot. This has been progressive over the last 7 weeks. 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 He felt this was related to nerve issues. This has been progressive. He did see a primary care and had a venous duplex which was normal in 06/13/2013 and [facility] have reviewed the studies. He presented last night to the [facility] emergency department and was felt to have arterial insufficiency. He did have motor and sensory function and is referred our office this morning. The patient reports although this is been present for 7 weeks has been more progressive over the last 2 weeks. He does report constant pain associated with this. He does have some dusky changes to his foot as well. He specifically denies any prior history of claudication type symptoms. He has no left leg symptoms. His past history is negative for any cardiac disease and negative for any history of stroke her amaurosis fugax or transient ischemic attack. He does have a 35 year history of smoking  approximately 2 packs per day  Pt had angiogram on 06/26/13 which showed -complete occlusion to of the SFA distal this including complete occlusion of the popliteal artery. There was reconstitution of the anterior tibial and tibioperoneal trunk origin with the dominant vessel being the posterior tibial runoff into the foot.   Hospital Course:  ROSENDO COUSER is a 52 y.o. male is S/P  Procedure(s):06/28/13 #1 right common femoral artery endarterectomy, #2 right superficial femoral and popliteal thrombectomy, #3 right femoral to tibioperoneal trunk bypass with translocated reversed great saphenous vein  Extubated: POD # 0 Physical exam: Pt is A&O x 3  right lower extremity: Incision/s is/are clean,dry.intact, and healing  without hematoma, positive Erythema in toes - unchanged no drainage  Limb is warm; with good color  3rd toe tip demarcation  Right Posterior tibial pulse is palpable  Post-op wounds healing well Pt. Ambulating, voiding and taking PO diet without difficulty. Pt pain controlled with PO pain meds. Labs as below Complications:none  Consults:     Significant Diagnostic Studies: CBC Lab Results  Component Value Date   WBC 12.6* 06/29/2013   HGB 15.8 06/29/2013   HCT 46.3 06/29/2013   MCV 99.6 06/29/2013   PLT 165 06/29/2013    BMET    Component Value Date/Time   NA 134* 06/29/2013 0440   K 3.6 06/29/2013 0440   CL 101 06/29/2013 0440   CO2 22 06/29/2013 0440   GLUCOSE 112* 06/29/2013 0440   BUN 6 06/29/2013 0440   CREATININE 0.67 06/29/2013 0440   CALCIUM 8.9 06/29/2013 0440   GFRNONAA >90 06/29/2013  0440   GFRAA >90 06/29/2013 0440   COAG Lab Results  Component Value Date   INR 0.98 06/26/2013     Disposition:  Discharge to :Home Discharge Orders   Future Appointments Provider Department Dept Phone   09/17/2013 1:30 PM Larina Earthly, MD [facility] [phone]   Future Orders Complete By Expires   Call MD for:  redness, tenderness, or  signs of infection (pain, swelling, bleeding, redness, odor or green/yellow discharge around incision site)  As directed    Call MD for:  severe or increased pain, loss or decreased feeling  in affected limb(s)  As directed    Call MD for:  temperature >100.5  As directed    Driving Restrictions  As directed    Comments:     No driving for 3 weeks and you are off pain medication   Increase activity slowly  As directed    Comments:     Walk with assistance use walker or cane as needed   May shower   As directed    may wash over wound with mild soap and water  As directed    No dressing needed  As directed    Resume previous diet  As directed        Medication List    STOP taking these medications       HYDROcodone-acetaminophen 5-325 MG per tablet  Commonly known as:  NORCO/VICODIN     oxyCODONE 5 MG immediate release tablet  Commonly known as:  Oxy IR/ROXICODONE      TAKE these medications       atorvastatin 10 MG tablet  Commonly known as:  LIPITOR  Take 1 tablet (10 mg total) by mouth daily at 6 PM.     AVEENO ANTI-ITCH 1-3 % Crea  Generic drug:  Pramoxine-Calamine  Apply 1 application topically 2 (two) times daily as needed (for itching).     diphenhydrAMINE 25 mg capsule  Commonly known as:  BENADRYL  Take 25 mg by mouth every 6 (six) hours as needed for itching.     HYDROmorphone 2 MG tablet  Commonly known as:  DILAUDID  Take 1-2 tablets (2-4 mg total) by mouth every 4 (four) hours as needed.     mupirocin ointment 2 %  Commonly known as:  BACTROBAN  1 Application, nasally, 2 times daily prior to surgery     permethrin 5 % cream  Commonly known as:  ELIMITE  Apply 1 application topically once. *apply to entire body except for the face, let sit for 14 hours, then wash off. May repeat in 1 week if still having symptoms     pregabalin 50 MG capsule  Commonly known as:  LYRICA  Take 1 capsule (50 mg total) by mouth 2 (two) times daily.       Verbal and  written Discharge instructions given to the patient. Wound care per Discharge AVS   Signed: Marlowe Shores 07/24/2013, 9:23 AM  - For VQI Registry use --- Instructions: Press F2 to tab through selections.  Delete question if not applicable.   Post-op:  Wound infection: No  Graft infection: No  Transfusion: No   New Arrhythmia: No Ipsilateral amputation: [x ] no, [ ]  Minor, [ ]  BKA, [ ]  AKA Discharge patency: [x ] Primary, [ ]  Primary assisted, [ ]  Secondary, [ ]  Occluded Patency judged by: [ ]  Dopper only, [ ]  Palpable graft pulse, [x ] Palpable distal pulse, [ ]  ABI  inc. > 0.15, [ ]  Duplex Discharge ABI: R 0.74, L >1  D/C Ambulatory Status: Ambulatory  Complications: MI: [x ] No, [ ]  Troponin only, [ ]  EKG or Clinical CHF: No Resp failure: [ x] none, [ ]  Pneumonia, [ ]  Ventilator Chg in renal function: [ x] none, [ ]  Inc. Cr > 0.5, [ ]  Temp. Dialysis, [ ]  Permanent dialysis Stroke: [x ] None, [ ]  Minor, [ ]  Major Return to OR: No  Reason for return to OR: [ ]  Bleeding, [ ]  Infection, [ ]  Thrombosis, [ ]  Revision  Discharge medications: Statin use:  Yes ASA use: no Plavix use:  no Beta blocker use: no Coumadin use: no

## 2013-07-29 ENCOUNTER — Telehealth: Payer: Self-pay | Admitting: *Deleted

## 2013-07-29 DIAGNOSIS — M79609 Pain in unspecified limb: Secondary | ICD-10-CM

## 2013-07-29 DIAGNOSIS — I70229 Atherosclerosis of native arteries of extremities with rest pain, unspecified extremity: Secondary | ICD-10-CM

## 2013-07-29 MED ORDER — HYDROCODONE-ACETAMINOPHEN 5-325 MG PO TABS: 1.0000 | ORAL_TABLET | Freq: Four times a day (QID) | ORAL | Status: DC | PRN

## 2013-07-29 NOTE — Telephone Encounter (Signed)
Per Dr. Myra Gianotti, in Dr. Bosie Helper absence  Ok to refill ONE more time for Norco 5-325 #30. If the patient needs more medicines he would advise pain clinic referral.

## 2013-07-29 NOTE — Telephone Encounter (Signed)
Patient called with same foot and toe burning pain; wants more meds. ( Last rx'd on 07-23-13 Norco 5-325 #30).  I spoke with the pharmacist at [facility] [address] [place],

## 2013-08-06 ENCOUNTER — Telehealth: Payer: Self-pay

## 2013-08-06 NOTE — Telephone Encounter (Addendum)
Phone call from pt.  Reported noticing a warmth in the (R) groin incisional area, the (R) below knee incisional area and posterior upper thigh.  Denies swelling of right leg; states the right foot toes and up to approx. 1/3 of foot has swelling, but has improved.  Denies any redness or swelling in above noted incisional areas.  States incisions look good; no redness, swelling, drainage, or opening of incisions.  Denies fever/ chills.  Stated that the pain level of (R) foot has become less since last night.  Encouraged to continue to monitor for signs of redness, increased tenderness, increased warmth of incisional areas, any opening of incision, drainage, fever, or chills.  Pt. Verb. Understanding.   Will make Dr. Arbie Cookey aware.  Discussed w/ Dr. Arbie Cookey, and no new orders / recommendations at this time.

## 2013-08-13 ENCOUNTER — Telehealth: Payer: Self-pay

## 2013-08-13 DIAGNOSIS — G8918 Other acute postprocedural pain: Secondary | ICD-10-CM

## 2013-08-13 DIAGNOSIS — I739 Peripheral vascular disease, unspecified: Secondary | ICD-10-CM

## 2013-08-13 MED ORDER — HYDROCODONE-ACETAMINOPHEN 5-325 MG PO TABS: ORAL_TABLET | ORAL | Status: DC

## 2013-08-13 NOTE — Telephone Encounter (Signed)
Phone call from pt.  Reports has continued swelling in right ankle and forefoot.  States has been up on feet more frequently, and for longer periods.  Has been going to work partial days, recently. Requesting pain medication to "help me sleep through the night."  States pain awakens him.  States he is mainly in need of the medication at night.  States the pain in toes of right foot has improved compared to previously.  Asking for medication for smoking cessation.  Discussed pt's complaints with Dr. Arbie Cookey.  Gave verbal approval for Norco 5/325 mg 1-2 q 4-6 hrs prn/ # 30/ no refills.  Advised that pt. should continue to elevate his right leg intermittently, to help reduce the swelling.  Recommends pt. get the prescription for smoking cessation from his PCP.   Pt. notified of the above recommendations.  Verb. Understanding.

## 2013-09-16 ENCOUNTER — Encounter: Payer: Self-pay | Admitting: Vascular Surgery

## 2013-09-17 ENCOUNTER — Ambulatory Visit (INDEPENDENT_AMBULATORY_CARE_PROVIDER_SITE_OTHER): Payer: Self-pay | Admitting: Vascular Surgery

## 2013-09-17 ENCOUNTER — Encounter: Payer: Self-pay | Admitting: Vascular Surgery

## 2013-09-17 VITALS — BP 142/86 | HR 62 | Resp 18 | Ht 72.0 in | Wt 191.6 lb

## 2013-09-17 DIAGNOSIS — G8918 Other acute postprocedural pain: Secondary | ICD-10-CM

## 2013-09-17 DIAGNOSIS — I739 Peripheral vascular disease, unspecified: Secondary | ICD-10-CM

## 2013-09-17 DIAGNOSIS — Z48812 Encounter for surgical aftercare following surgery on the circulatory system: Secondary | ICD-10-CM

## 2013-09-17 HISTORY — DX: Other acute postprocedural pain: G89.18

## 2013-09-17 HISTORY — DX: Peripheral vascular disease, unspecified: I73.9

## 2013-09-17 NOTE — Progress Notes (Signed)
The patient is here today for continued followup of his right femoral endarterectomy, right superficial femoral and popliteal thrombectomy and right femoral to tibioperoneal trunk bypass with translocated reversed great saphenous vein. He continues to have demarcation of the necrosis that he had on his toes. He does continue to have pain associated with this. He has had resolution of his postoperative swelling.  On physical exam he does have well-healed surgical incisions. He has a 2+ femoral 2+ popliteal and 2+ posterior tibial pulse on the right. He does have dry gangrenous changes on the great toe second and fourth toe on the tips. There to continues to have some cyanosis of the pad of his third toe. He reports this has not been present until someone stepped on his toe from last week. This has caused worsening pain  Impression and plan continued progress after a gangrenous changes from profound ischemia of his right foot and toes. We'll continue usual activity. We will see him again in 2 months with office visit and noninvasive vascular lab studies

## 2013-09-17 NOTE — Addendum Note (Signed)
Addended by: Sharee Pimple on: 09/17/2013 02:12 PM   Modules accepted: Orders

## 2013-10-06 ENCOUNTER — Telehealth: Payer: Self-pay | Admitting: Vascular Surgery

## 2013-10-07 ENCOUNTER — Telehealth: Payer: Self-pay | Admitting: Vascular Surgery

## 2013-10-07 NOTE — Telephone Encounter (Addendum)
Message copied by Fredrich Birks on Mon Oct 07, 2013  3:08 PM ------      Message from: Melene Plan      Created: Mon Oct 07, 2013  9:58 AM      Regarding: FW: telephone call                   ----- Message -----         From: Chuck Hint, MD         Sent: 10/06/2013   2:21 PM           To: Reuel Derby, Melene Plan, RN, #      Subject: telephone call                                           This patient's daughter called because Mr. Westhoff's foot was red. He apparently has some dry wounds on his feet that Dr. Arbie Cookey has been following. The foot was warm and there was no evidence of ischemia. I have called him in a prescription for Cipro. The daughter will call on Monday to arrange to see Dr. Arbie Cookey on Tuesday. If this symptoms worsen he will go to the emergency department at [facility].      Thanks       CD ------  10/07/13: spoke with pt to schedule, although he says that he is feeling much better since starting the antibiotics. The pain is much less and he feels that he may not need to come in. I asked that he call me in the morning to either confirm or cancel this appointment, dpm

## 2013-10-08 ENCOUNTER — Ambulatory Visit: Payer: Self-pay | Admitting: Vascular Surgery

## 2013-10-09 ENCOUNTER — Telehealth: Payer: Self-pay | Admitting: Vascular Surgery

## 2013-10-09 NOTE — Telephone Encounter (Signed)
10-08-13 Pt. did not show for appt. He said " the antibiotic that was called in for him on Sunday seems to be working" will call back if he needs to be seen sooner than is January appt.

## 2013-10-25 ENCOUNTER — Telehealth: Payer: Self-pay | Admitting: *Deleted

## 2013-10-25 NOTE — Telephone Encounter (Signed)
Cody Baird stated he has not slept in 2 nights. He has stopped going to work because when up, walking his right third toe is very painful. He said this has been going on for 1 1/2 weeks now. The toe changes colors from red,blue,natural and is getting tough at tip again. He said the antibiotic a couple of weeks ago helped but is hurting worse now. He would like pain medicine to get through night.I spoke with Dr Imogene Burn & he said he needed to be seen since this has been going on for awhile. He has an appt for 1/6 but we made him an office visit for 12/23 at 1:30pm. He verbalized he may need something before then. I suggested going to the ER but he said he didn't have transportation but may go to urgent care in [place].

## 2013-10-29 ENCOUNTER — Telehealth: Payer: Self-pay

## 2013-10-29 DIAGNOSIS — I70229 Atherosclerosis of native arteries of extremities with rest pain, unspecified extremity: Secondary | ICD-10-CM

## 2013-10-29 NOTE — Telephone Encounter (Signed)
Phone call from pt. with report of continued pain in right 2nd, 3rd, and 4th toes for past 1 1/2 weeks.  Stated the pain is worse with laying down.  Describes the 3rd toe as having a dark tip and dry; stated "it looks rough".  Stated the 4th toe is red and tender.  Denies any change in color of right leg; states it is mainly the 3 toes and at the base of those toes that are painful.  Stated "I can't stand the pain."   Discussed with Dr. Arbie Cookey.  Recommended pt. have ABI's and office visit tomorrow, to R/O occluded bypass graft.  Appt. given to pt. For 8:45 AM.  Agrees w/ plan.

## 2013-10-30 ENCOUNTER — Ambulatory Visit (HOSPITAL_COMMUNITY)
Admission: RE | Admit: 2013-10-30 | Discharge: 2013-10-30 | Disposition: A | Discharge status: Home / Self Care | Payer: Self-pay | Source: Ambulatory Visit | Attending: Vascular Surgery | Admitting: Vascular Surgery

## 2013-10-30 ENCOUNTER — Encounter: Payer: Self-pay | Admitting: Vascular Surgery

## 2013-10-30 ENCOUNTER — Ambulatory Visit (INDEPENDENT_AMBULATORY_CARE_PROVIDER_SITE_OTHER): Payer: Self-pay | Admitting: Vascular Surgery

## 2013-10-30 VITALS — BP 145/91 | HR 57 | Temp 98.2°F | Ht 72.0 in | Wt 189.4 lb

## 2013-10-30 DIAGNOSIS — I70229 Atherosclerosis of native arteries of extremities with rest pain, unspecified extremity: Secondary | ICD-10-CM

## 2013-10-30 DIAGNOSIS — G8918 Other acute postprocedural pain: Secondary | ICD-10-CM

## 2013-10-30 DIAGNOSIS — I739 Peripheral vascular disease, unspecified: Secondary | ICD-10-CM

## 2013-10-30 HISTORY — DX: Atherosclerosis of native arteries of extremities with rest pain, unspecified extremity: I70.229

## 2013-10-30 NOTE — Progress Notes (Signed)
   Patient name: Cody Baird MRN: [id number] DOB: 25-Jul-1961 Sex: male  REASON FOR VISIT: Increasing pain right second third and fourth toes.  HPI: Cody Baird is a 52 y.o. male who underwent a right common femoral artery endarterectomy, superficial femoral and popliteal artery thrombectomy and a right femoral to tibial peroneal trunk bypass with a vein graft on 06/28/2013. He was last seen in our office on 09/17/2013. At that time he was continuing to have demarcation of necrotic areas on his toes. He had a palpable femoral, popliteal, and posterior tibial pulse on the right. He was to come back in 2 months but call to be seen sooner because of increasing pain. This pain has gradually increased. There has been no sudden increase in pain. He denies fever or chills.  REVIEW OF SYSTEMS: Arly.Keller ] denotes positive finding; [  ] denotes negative finding  CARDIOVASCULAR:  [ ]  chest pain   [ ]  dyspnea on exertion    CONSTITUTIONAL:  [ ]  fever   [ ]  chills  PHYSICAL EXAM: Filed Vitals:   10/30/13 0940  BP: 145/91  Pulse: 57  Temp: 98.2 F (36.8 C)  TempSrc: Oral  Height: 6' (1.829 m)  Weight: 189 lb 6.4 oz (85.911 kg)  SpO2: 96%   Body mass index is 25.68 kg/(m^2). GENERAL: The patient is a well-nourished male, in no acute distress. The vital signs are documented above. CARDIOVASCULAR: There is a regular rate and rhythm. PULMONARY: There is good air exchange bilaterally without wheezing or rales. He has a palpable right femoral, popliteal, and posterior tibial pulse. He has a small area of dry gangrene on the tip of his right great toe. The second third and fourth toes are somewhat cyanotic. There is no erythema or drainage.  MEDICAL ISSUES: This patient has a functioning bypass graft with excellent blood flow. I have discussed with him the importance of tobacco cessation. I think this would help his pain significantly. I have given him a prescription for 30 Vicodin for pain.  He will keep his  regularly scheduled appointment with Dr. Arbie Cookey in January.  Taaliyah Delpriore S [facility] [place] [facility]: [phone]

## 2013-11-05 ENCOUNTER — Ambulatory Visit: Payer: Self-pay | Admitting: Vascular Surgery

## 2013-11-06 ENCOUNTER — Ambulatory Visit: Payer: Self-pay | Admitting: Vascular Surgery

## 2013-11-11 ENCOUNTER — Other Ambulatory Visit: Payer: Self-pay | Admitting: *Deleted

## 2013-11-11 DIAGNOSIS — G8918 Other acute postprocedural pain: Secondary | ICD-10-CM

## 2013-11-11 DIAGNOSIS — I739 Peripheral vascular disease, unspecified: Secondary | ICD-10-CM

## 2013-11-11 MED ORDER — HYDROCODONE-ACETAMINOPHEN 5-325 MG PO TABS: ORAL_TABLET | ORAL | Status: DC

## 2013-11-11 NOTE — Progress Notes (Signed)
Patient called requesting more pain medications, ( Dr. Edilia Bo last rx on 10-30-13 for Vicodin 5-325mg  # 30).  He has rest pain symptoms but no drainage of fever. His next appt is with Dr. Arbie Cookey on 11-19-13.  Dr. Myra Gianotti agreed to give him a RX for Vicodin 5-325mg  # 20 with no refills. This Rx must last the patient until 11-19-13.    I called patient and he voiced understanding of this plan.

## 2013-11-18 ENCOUNTER — Encounter: Payer: Self-pay | Admitting: Vascular Surgery

## 2013-11-19 ENCOUNTER — Ambulatory Visit (HOSPITAL_COMMUNITY)
Admission: RE | Admit: 2013-11-19 | Discharge: 2013-11-19 | Disposition: A | Discharge status: Home / Self Care | Payer: Self-pay | Source: Ambulatory Visit | Attending: Vascular Surgery | Admitting: Vascular Surgery

## 2013-11-19 ENCOUNTER — Ambulatory Visit (INDEPENDENT_AMBULATORY_CARE_PROVIDER_SITE_OTHER)
Admission: RE | Admit: 2013-11-19 | Discharge: 2013-11-19 | Disposition: A | Discharge status: Home / Self Care | Payer: Self-pay | Source: Ambulatory Visit | Attending: Vascular Surgery | Admitting: Vascular Surgery

## 2013-11-19 ENCOUNTER — Ambulatory Visit (INDEPENDENT_AMBULATORY_CARE_PROVIDER_SITE_OTHER): Payer: Self-pay | Admitting: Vascular Surgery

## 2013-11-19 ENCOUNTER — Encounter: Payer: Self-pay | Admitting: Vascular Surgery

## 2013-11-19 VITALS — BP 115/78 | HR 50 | Ht 72.0 in | Wt 190.0 lb

## 2013-11-19 DIAGNOSIS — I739 Peripheral vascular disease, unspecified: Secondary | ICD-10-CM | POA: Insufficient documentation

## 2013-11-19 DIAGNOSIS — M79609 Pain in unspecified limb: Secondary | ICD-10-CM

## 2013-11-19 DIAGNOSIS — Z48812 Encounter for surgical aftercare following surgery on the circulatory system: Secondary | ICD-10-CM

## 2013-11-19 NOTE — Progress Notes (Signed)
The patient presents today for continued followup of his right common femoral artery endarterectomy right SFA and popliteal thrombectomy and right femoral to tibioperoneal trunk bypass from 06/28/2013. 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 He continues to have a great deal of pain associated with this. He has had sloughing of the eschar over his great toe and has a near separation over his fourth toe. He continues to have duskiness of his third toe over approximately half of the toe. He does continue to have severe pain mainly in the third toe but this also extends into the distal portion of his foot.  Past Medical History  Diagnosis Date  . Prolapsed, anus   . Peripheral vascular disease   . PAD (peripheral artery disease)   . Shortness of breath     with exertion    History  Substance Use Topics  . Smoking status: Current Every Day Smoker -- 2.00 packs/day for 35 years    Types: Cigarettes  . Smokeless tobacco: Not on file  . Alcohol Use: 2.4 oz/week    4 Cans of beer per week    Family History  Problem Relation Age of Onset  . Cancer Mother   . Diabetes Mother   . Diabetes Father   . Hyperlipidemia Father   . Hypertension Father   . Heart attack Father     Allergies  Allergen Reactions  . Penicillins     Current outpatient prescriptions:atorvastatin (LIPITOR) 10 MG tablet, Take 1 tablet (10 mg total) by mouth daily at 6 PM., Disp: 60 tablet, Rfl: 3;  diphenhydrAMINE (BENADRYL) 25 mg capsule, Take 25 mg by mouth every 6 (six) hours as needed for itching., Disp: , Rfl: ;  HYDROcodone-acetaminophen (NORCO/VICODIN) 5-325 MG per tablet, Take 1 tablet every 6 hrs as needed for pain, Disp: 20 tablet, Rfl: 0 mupirocin ointment (BACTROBAN) 2 %, 1 Application, nasally, 2 times daily prior to surgery, Disp: 22 g, Rfl: 0;  permethrin (ELIMITE) 5 % cream, Apply 1 application topically once. *apply to entire body except for the  face, let sit for 14 hours, then wash off. May repeat in 1 week if still having symptoms, Disp: , Rfl:  Pramoxine-Calamine (AVEENO ANTI-ITCH) 1-3 % CREA, Apply 1 application topically 2 (two) times daily as needed (for itching)., Disp: , Rfl: ;  pregabalin (LYRICA) 50 MG capsule, Take 1 capsule (50 mg total) by mouth 2 (two) times daily., Disp: 60 capsule, Rfl: 0;  HYDROmorphone (DILAUDID) 2 MG tablet, Take 1-2 tablets (2-4 mg total) by mouth every 4 (four) hours as needed., Disp: 30 tablet, Rfl: 0  BP 115/78  Pulse 50  Ht 6' (1.829 m)  Wt 190 lb (86.183 kg)  BMI 25.76 kg/m2  SpO2 100%  Body mass index is 25.76 kg/(m^2).       On physical exam his surgical incisions are all well healed. He does have a palpable popliteal and palpable posterior tibial pulse on the right. He does have duskiness as described mainly on the third toe but also on the tip of his second toe to some degree  Noninvasive studies today reveal ankle arm index of 0.92 on the right. He has normal waveforms and velocities throughout the popliteal bypass.  Impression and plan: Well-perfused foot with continued severe pain associated with ischemia specifically in the third toe. I explained that this will eventually resolved but has been 4-5 months with severe pain. I did offer amputation  of his third toe as hopefully expediting resolution of his pain. He will consider this understands this would be done as an outpatient. He was written prescription for Percocet 5/325 #40 no refills. He reports that he is down to approximately 7 or 8 cigarettes per day and is continuing to attempt to completely stop the

## 2013-11-26 ENCOUNTER — Telehealth: Payer: Self-pay

## 2013-11-26 DIAGNOSIS — M79673 Pain in unspecified foot: Principal | ICD-10-CM

## 2013-11-26 DIAGNOSIS — I998 Other disorder of circulatory system: Secondary | ICD-10-CM

## 2013-11-26 MED ORDER — OXYCODONE-ACETAMINOPHEN 5-325 MG PO TABS: 1.0000 | ORAL_TABLET | ORAL | Status: DC | PRN

## 2013-11-26 NOTE — Telephone Encounter (Signed)
Phone call from pt.  Stated he discussed with Dr. Arbie CookeyEarly about amputation of 3rd toe of right foot at last appt.  Requests to go ahead and schedule this due to the amt. Of pain in the toe.  Stated he only has 2 pain pills left and would like a refill.  Discussed with Dr. Arbie CookeyEarly.  Recommended to sched. Right 3rd toe amputation on 12/04/13; also approved to give pt. # 30 of Percocet 5/325 mg. For pain.  Called pt. Back and advised of plan for surgery, and that Rx will be ready for pick up tomorrow.  Pt. Stated he has a small opening on the side of the 3rd toe of right foot.  Stated it is moist.  Recommended to soak daily in warm water with Dial soap, and to apply a dry gauze dressing between the toes.  Pt. Knows to call if symptoms worsen.

## 2013-11-29 ENCOUNTER — Other Ambulatory Visit: Payer: Self-pay

## 2013-12-03 ENCOUNTER — Encounter (HOSPITAL_COMMUNITY): Payer: Self-pay

## 2013-12-03 ENCOUNTER — Encounter (HOSPITAL_COMMUNITY)
Admission: RE | Admit: 2013-12-03 | Discharge: 2013-12-03 | Disposition: A | Discharge status: Home / Self Care | Payer: Self-pay | Source: Ambulatory Visit | Attending: Vascular Surgery | Admitting: Vascular Surgery

## 2013-12-03 ENCOUNTER — Encounter (HOSPITAL_COMMUNITY): Payer: Self-pay | Admitting: Pharmacy Technician

## 2013-12-03 DIAGNOSIS — Z01818 Encounter for other preprocedural examination: Secondary | ICD-10-CM | POA: Insufficient documentation

## 2013-12-03 DIAGNOSIS — Z01812 Encounter for preprocedural laboratory examination: Secondary | ICD-10-CM | POA: Insufficient documentation

## 2013-12-03 HISTORY — DX: Headache: R51

## 2013-12-03 LAB — CBC
HEMATOCRIT: 54.6 % — AB (ref 39.0–52.0)
Hemoglobin: 18.9 g/dL — ABNORMAL HIGH (ref 13.0–17.0)
MCH: 33.5 pg (ref 26.0–34.0)
MCHC: 34.6 g/dL (ref 30.0–36.0)
MCV: 96.6 fL (ref 78.0–100.0)
PLATELETS: 171 10*3/uL (ref 150–400)
RBC: 5.65 MIL/uL (ref 4.22–5.81)
RDW: 13.3 % (ref 11.5–15.5)
WBC: 7 10*3/uL (ref 4.0–10.5)

## 2013-12-03 LAB — COMPREHENSIVE METABOLIC PANEL
ALK PHOS: 88 U/L (ref 39–117)
ALT: 17 U/L (ref 0–53)
AST: 20 U/L (ref 0–37)
Albumin: 4 g/dL (ref 3.5–5.2)
BILIRUBIN TOTAL: 0.3 mg/dL (ref 0.3–1.2)
BUN: 9 mg/dL (ref 6–23)
CHLORIDE: 100 meq/L (ref 96–112)
CO2: 25 mEq/L (ref 19–32)
Calcium: 10.2 mg/dL (ref 8.4–10.5)
Creatinine, Ser: 0.86 mg/dL (ref 0.50–1.35)
GFR calc non Af Amer: >90 mL/min (ref >90)
GLUCOSE: 115 mg/dL — AB (ref 70–99)
POTASSIUM: 5.1 meq/L (ref 3.7–5.3)
Sodium: 139 mEq/L (ref 137–147)
TOTAL PROTEIN: 8.1 g/dL (ref 6.0–8.3)

## 2013-12-03 MED ORDER — VANCOMYCIN HCL IN DEXTROSE 1-5 GM/200ML-% IV SOLN
1000.0000 mg | INTRAVENOUS | Status: AC
  Administered 2013-12-04: 1000 mg via INTRAVENOUS
  Filled 2013-12-03: qty 200

## 2013-12-03 NOTE — Progress Notes (Signed)
Pt notified of time change to arrive at 0830-verbalized understanding

## 2013-12-03 NOTE — Progress Notes (Signed)
Denies having a PCP or cardiologist Echo noted in epic from 06-28-13 Denies having a stress test or card cath. EKG noted in epic from 06-26-13

## 2013-12-03 NOTE — Pre-Procedure Instructions (Signed)
Cody Baird  12/03/2013   Your procedure is scheduled on:  Dec 04, 2013 at 1015  [facility]  2 * 3 at 0730 AM.  Call this number if you have problems the morning of surgery: [phone]   Remember:   Do not eat food or drink liquids after midnight.   Take these medicines the morning of surgery with A SIP OF WATER:Pain Pill if needed  Stop taking Aspirin, Aleve, Ibuprofen, BC's, Goody's, Herbal medications, and Fish Oil.   Do not wear jewelry, make-up or nail polish.  Do not wear lotions, powders, or perfumes. You may wear deodorant.  Do not shave 48 hours prior to surgery. Men may shave face and neck.  Do not bring valuables to the hospital.  [facility]                  for any belongings or valuables.               Contacts, dentures or bridgework may not be worn into surgery.  Leave suitcase in the car. After surgery it may be brought to your room.  For patients admitted to the hospital, discharge time is determined by your                treatment team.               Patients discharged the day of surgery will not be allowed to drive  home.    Special Instructions: Shower using CHG 2 nights before surgery and the night before surgery.  If you shower the day of surgery use CHG.  Use special wash - you have one bottle of CHG for all showers.  You should use approximately 1/3 of the bottle for each shower.   Please read over the following fact sheets that you were given: Pain Booklet, Coughing and Deep Breathing and Surgical Site Infection Prevention

## 2013-12-04 ENCOUNTER — Telehealth: Payer: Self-pay | Admitting: Vascular Surgery

## 2013-12-04 ENCOUNTER — Encounter (HOSPITAL_COMMUNITY): Payer: Self-pay | Admitting: Anesthesiology

## 2013-12-04 ENCOUNTER — Encounter (HOSPITAL_COMMUNITY)
Admission: RE | Disposition: A | Discharge status: Home / Self Care | Payer: Self-pay | Source: Ambulatory Visit | Attending: Vascular Surgery

## 2013-12-04 ENCOUNTER — Ambulatory Visit (HOSPITAL_COMMUNITY)
Admission: RE | Admit: 2013-12-04 | Discharge: 2013-12-04 | Disposition: A | Discharge status: Home / Self Care | Payer: Self-pay | Source: Ambulatory Visit | Attending: Vascular Surgery | Admitting: Vascular Surgery

## 2013-12-04 ENCOUNTER — Ambulatory Visit (HOSPITAL_COMMUNITY): Payer: Self-pay | Admitting: Anesthesiology

## 2013-12-04 DIAGNOSIS — I739 Peripheral vascular disease, unspecified: Secondary | ICD-10-CM | POA: Insufficient documentation

## 2013-12-04 DIAGNOSIS — F17200 Nicotine dependence, unspecified, uncomplicated: Secondary | ICD-10-CM | POA: Insufficient documentation

## 2013-12-04 DIAGNOSIS — I70269 Atherosclerosis of native arteries of extremities with gangrene, unspecified extremity: Secondary | ICD-10-CM

## 2013-12-04 DIAGNOSIS — I96 Gangrene, not elsewhere classified: Secondary | ICD-10-CM | POA: Insufficient documentation

## 2013-12-04 HISTORY — PX: AMPUTATION: SHX166

## 2013-12-04 SURGERY — AMPUTATION DIGIT
Anesthesia: General | Site: Toe | Laterality: Right

## 2013-12-04 MED ORDER — ONDANSETRON HCL 4 MG/2ML IJ SOLN
INTRAMUSCULAR | Status: AC
  Filled 2013-12-04: qty 2

## 2013-12-04 MED ORDER — SUCCINYLCHOLINE CHLORIDE 20 MG/ML IJ SOLN
INTRAMUSCULAR | Status: AC
  Filled 2013-12-04: qty 1

## 2013-12-04 MED ORDER — PROPOFOL 10 MG/ML IV BOLUS
INTRAVENOUS | Status: AC
  Filled 2013-12-04: qty 20

## 2013-12-04 MED ORDER — FENTANYL CITRATE 0.05 MG/ML IJ SOLN
INTRAMUSCULAR | Status: AC
  Filled 2013-12-04: qty 2

## 2013-12-04 MED ORDER — FENTANYL CITRATE 0.05 MG/ML IJ SOLN: 25.0000 ug | INTRAMUSCULAR | Status: DC | PRN

## 2013-12-04 MED ORDER — OXYCODONE-ACETAMINOPHEN 5-325 MG PO TABS
1.0000 | ORAL_TABLET | Freq: Once | ORAL | Status: AC
Start: 2013-12-04 — End: 2013-12-04
  Administered 2013-12-04: 2 via ORAL

## 2013-12-04 MED ORDER — MIDAZOLAM HCL 2 MG/2ML IJ SOLN
INTRAMUSCULAR | Status: AC
  Filled 2013-12-04: qty 2

## 2013-12-04 MED ORDER — 0.9 % SODIUM CHLORIDE (POUR BTL) OPTIME
TOPICAL | Status: DC | PRN
  Administered 2013-12-04: 1000 mL

## 2013-12-04 MED ORDER — SODIUM CHLORIDE 0.9 % IV SOLN: INTRAVENOUS | Status: DC

## 2013-12-04 MED ORDER — LIDOCAINE HCL (CARDIAC) 20 MG/ML IV SOLN
INTRAVENOUS | Status: AC
  Filled 2013-12-04: qty 5

## 2013-12-04 MED ORDER — KETOROLAC TROMETHAMINE 30 MG/ML IJ SOLN
INTRAMUSCULAR | Status: AC
  Filled 2013-12-04: qty 1

## 2013-12-04 MED ORDER — ONDANSETRON HCL 4 MG/2ML IJ SOLN: 4.0000 mg | Freq: Four times a day (QID) | INTRAMUSCULAR | Status: DC | PRN

## 2013-12-04 MED ORDER — ROCURONIUM BROMIDE 50 MG/5ML IV SOLN
INTRAVENOUS | Status: AC
  Filled 2013-12-04: qty 1

## 2013-12-04 MED ORDER — LACTATED RINGERS IV SOLN
INTRAVENOUS | Status: DC | PRN
  Administered 2013-12-04: 08:00:00 via INTRAVENOUS

## 2013-12-04 MED ORDER — KETOROLAC TROMETHAMINE 30 MG/ML IJ SOLN
INTRAMUSCULAR | Status: DC | PRN
  Administered 2013-12-04: 30 mg via INTRAVENOUS

## 2013-12-04 MED ORDER — ONDANSETRON HCL 4 MG/2ML IJ SOLN
INTRAMUSCULAR | Status: DC | PRN
  Administered 2013-12-04: 4 mg via INTRAVENOUS

## 2013-12-04 MED ORDER — OXYCODONE HCL 5 MG/5ML PO SOLN: 5.0000 mg | Freq: Once | ORAL | Status: DC | PRN

## 2013-12-04 MED ORDER — LIDOCAINE HCL (PF) 1 % IJ SOLN
INTRAMUSCULAR | Status: AC
  Filled 2013-12-04: qty 30

## 2013-12-04 MED ORDER — OXYCODONE HCL 5 MG PO TABS: 5.0000 mg | ORAL_TABLET | Freq: Once | ORAL | Status: DC | PRN

## 2013-12-04 MED ORDER — OXYCODONE-ACETAMINOPHEN 5-325 MG PO TABS
ORAL_TABLET | ORAL | Status: AC
  Administered 2013-12-04: 2 via ORAL
  Filled 2013-12-04: qty 2

## 2013-12-04 MED ORDER — OXYCODONE-ACETAMINOPHEN 5-325 MG PO TABS: 1.0000 | ORAL_TABLET | ORAL | Status: DC | PRN

## 2013-12-04 MED ORDER — FENTANYL CITRATE 0.05 MG/ML IJ SOLN
INTRAMUSCULAR | Status: AC
  Filled 2013-12-04: qty 5

## 2013-12-04 MED ORDER — MIDAZOLAM HCL 5 MG/5ML IJ SOLN
INTRAMUSCULAR | Status: DC | PRN
  Administered 2013-12-04 (×2): 1 mg via INTRAVENOUS

## 2013-12-04 MED ORDER — FENTANYL CITRATE 0.05 MG/ML IJ SOLN
INTRAMUSCULAR | Status: DC | PRN
  Administered 2013-12-04: 100 ug via INTRAVENOUS
  Administered 2013-12-04 (×2): 50 ug via INTRAVENOUS
  Administered 2013-12-04: 100 ug via INTRAVENOUS
  Administered 2013-12-04 (×2): 50 ug via INTRAVENOUS

## 2013-12-04 SURGICAL SUPPLY — 23 items
BANDAGE GAUZE ELAST BULKY 4 IN (GAUZE/BANDAGES/DRESSINGS) ×3 IMPLANT
CANISTER SUCTION 2500CC (MISCELLANEOUS) ×3 IMPLANT
COVER SURGICAL LIGHT HANDLE (MISCELLANEOUS) ×3 IMPLANT
DRAPE EXTREMITY T 121X128X90 (DRAPE) ×3 IMPLANT
ELECT REM PT RETURN 9FT ADLT (ELECTROSURGICAL) ×3
ELECTRODE REM PT RTRN 9FT ADLT (ELECTROSURGICAL) ×1 IMPLANT
GLOVE SS BIOGEL STRL SZ 7.5 (GLOVE) ×1 IMPLANT
GLOVE SUPERSENSE BIOGEL SZ 7.5 (GLOVE) ×2
GLOVE SURG SS PI 7.0 STRL IVOR (GLOVE) ×3 IMPLANT
GOWN STRL REUS W/ TWL LRG LVL3 (GOWN DISPOSABLE) ×2 IMPLANT
GOWN STRL REUS W/TWL LRG LVL3 (GOWN DISPOSABLE) ×4
KIT BASIN OR (CUSTOM PROCEDURE TRAY) ×3 IMPLANT
KIT ROOM TURNOVER OR (KITS) ×3 IMPLANT
NS IRRIG 1000ML POUR BTL (IV SOLUTION) ×3 IMPLANT
PACK GENERAL/GYN (CUSTOM PROCEDURE TRAY) ×3 IMPLANT
PAD ARMBOARD 7.5X6 YLW CONV (MISCELLANEOUS) ×3 IMPLANT
SPECIMEN JAR SMALL (MISCELLANEOUS) ×3 IMPLANT
SPONGE GAUZE 4X4 12PLY (GAUZE/BANDAGES/DRESSINGS) ×3 IMPLANT
SUT ETHILON 3 0 PS 1 (SUTURE) ×3 IMPLANT
SUT VIC AB 3-0 SH 27 (SUTURE) ×2
SUT VIC AB 3-0 SH 27X BRD (SUTURE) ×1 IMPLANT
TOWEL OR 17X26 10 PK STRL BLUE (TOWEL DISPOSABLE) ×3 IMPLANT
UNDERPAD 30X30 INCONTINENT (UNDERPADS AND DIAPERS) ×3 IMPLANT

## 2013-12-04 NOTE — Transfer of Care (Signed)
Immediate Anesthesia Transfer of Care Note  Patient: Cody Baird  Procedure(s) Performed: Procedure(s): AMPUTATION DIGIT- 3RD TOE RIGHT FOOT  (Right)  Patient Location: PACU  Anesthesia Type:General  Level of Consciousness: awake, alert , oriented and patient cooperative  Airway & Oxygen Therapy: Patient Spontanous Breathing and Patient connected to nasal cannula oxygen  Post-op Assessment: Report given to PACU RN, Post -op Vital signs reviewed and stable and Patient moving all extremities  Post vital signs: Reviewed and stable  Complications: No apparent anesthesia complications

## 2013-12-04 NOTE — Anesthesia Postprocedure Evaluation (Signed)
Anesthesia Post Note  Patient: Cody Baird  Procedure(s) Performed: Procedure(s) (LRB): AMPUTATION DIGIT- 3RD TOE RIGHT FOOT  (Right)  Anesthesia type: General  Patient location: PACU  Post pain: Pain level controlled and Adequate analgesia  Post assessment: Post-op Vital signs reviewed, Patient's Cardiovascular Status Stable, Respiratory Function Stable, Patent Airway and Pain level controlled  Last Vitals:  Filed Vitals:   12/04/13 0916  BP: 144/94  Pulse: 86  Temp: 36.4 C  Resp: 16    Post vital signs: Reviewed and stable  Level of consciousness: awake, alert  and oriented  Complications: No apparent anesthesia complications

## 2013-12-04 NOTE — Telephone Encounter (Addendum)
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  Needs fu in office in 3 weeks ------  12/04/13 @ 1:53pm- spoke with patient to confirm follow up appointment scheduled for 12/24/13 @ 1:30pm. Pt in agreement with date/time, dpm

## 2013-12-04 NOTE — Op Note (Signed)
    OPERATIVE REPORT  DATE OF SURGERY: 12/04/2013  PATIENT: Cody HeckWalter A Vaughn, 53 y.o. male MRN: [id number]  DOB: January 14, 1961  PRE-OPERATIVE DIAGNOSIS: Ischemia and gangrenous changes right third toe  POST-OPERATIVE DIAGNOSIS:  Same  PROCEDURE: Right third toe amputation  SURGEON:  Gretta Beganodd Connor Foxworthy, M.D.  PHYSICIAN ASSISTANT: Nurse  ANESTHESIA:  LMA  EBL: Minimal ml  Total I/O In: 700 [I.V.:700] Out: -   BLOOD ADMINISTERED: None  DRAINS: None  SPECIMEN: Third toe  COUNTS CORRECT:  YES  PLAN OF CARE: PACU   PATIENT DISPOSITION:  PACU - hemodynamically stable  PROCEDURE DETAILS: The patient was taken to the operating room placed supine position where the area the right foot was prepped and draped in usual sterile fashion. There was a dry gangrene on the tip of the right fourth toe and this eschar was removed with no evidence of bony involvement. The right fourth toe nail was also removed. Attention turned to the third toe. There was necrosis of the tip of this. A fishmouth incision was made at the base of the third toe and carried down to the bone. There was good bleeding and no evidence of infection at this area. The proximal digital bone was divided with Fredrik Coveoger and the edges were smoothed with a rongeur. The wound is irrigated with saline and hemostasis was obtained with electrocautery. The wound was closed with several 3-0 nylon mattress sutures. Dry dressing was applied and the patient was taken to the recovery room in stable condition   Gretta Beganodd Michael Ventresca, M.D. 12/04/2013 9:13 AM

## 2013-12-04 NOTE — Anesthesia Preprocedure Evaluation (Signed)
Anesthesia Evaluation  Patient identified by MRN, date of birth, ID band Patient awake    Reviewed: Allergy & Precautions, H&P , NPO status , Patient's Chart, lab work & pertinent test results  Airway Mallampati: II  Neck ROM: full    Dental   Pulmonary shortness of breath, Current Smoker,          Cardiovascular + Peripheral Vascular Disease     Neuro/Psych  Headaches,    GI/Hepatic   Endo/Other    Renal/GU      Musculoskeletal   Abdominal   Peds  Hematology   Anesthesia Other Findings   Reproductive/Obstetrics                           Anesthesia Physical Anesthesia Plan  ASA: II  Anesthesia Plan: General   Post-op Pain Management:    Induction: Intravenous  Airway Management Planned: LMA  Additional Equipment:   Intra-op Plan:   Post-operative Plan:   Informed Consent: I have reviewed the patients History and Physical, chart, labs and discussed the procedure including the risks, benefits and alternatives for the proposed anesthesia with the patient or authorized representative who has indicated his/her understanding and acceptance.     Plan Discussed with: CRNA, Anesthesiologist and Surgeon  Anesthesia Plan Comments:         Anesthesia Quick Evaluation

## 2013-12-04 NOTE — Interval H&P Note (Signed)
History and Physical Interval Note:  12/04/2013 8:07 AM  Cody LeiWalter A Kouns  has presented today for surgery, with the diagnosis of Ischemic Pain 3rd Toe of Right Foot Peripheral Vascular Disease  The various methods of treatment have been discussed with the patient and family. After consideration of risks, benefits and other options for treatment, the patient has consented to  Procedure(s): AMPUTATION DIGIT- 3RD TOE RIGHT FOOT  (Right) as a surgical intervention .  The patient's history has been reviewed, patient examined, no change in status, stable for surgery.  I have reviewed the patient's chart and labs.  Questions were answered to the patient's satisfaction.     EARLY, [name]

## 2013-12-04 NOTE — Discharge Instructions (Signed)

## 2013-12-04 NOTE — Preoperative (Signed)
Beta Blockers   Reason not to administer Beta Blockers:Not Applicable 

## 2013-12-04 NOTE — H&P (View-Only) (Signed)
The patient presents today for continued followup of his right common femoral artery endarterectomy right SFA and popliteal thrombectomy and right femoral to tibioperoneal trunk bypass from 06/28/2013. 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 He continues to have a great deal of pain associated with this. He has had sloughing of the eschar over his great toe and has a near separation over his fourth toe. He continues to have duskiness of his third toe over approximately half of the toe. He does continue to have severe pain mainly in the third toe but this also extends into the distal portion of his foot.  Past Medical History  Diagnosis Date  . Prolapsed, anus   . Peripheral vascular disease   . PAD (peripheral artery disease)   . Shortness of breath     with exertion    History  Substance Use Topics  . Smoking status: Current Every Day Smoker -- 2.00 packs/day for 35 years    Types: Cigarettes  . Smokeless tobacco: Not on file  . Alcohol Use: 2.4 oz/week    4 Cans of beer per week    Family History  Problem Relation Age of Onset  . Cancer Mother   . Diabetes Mother   . Diabetes Father   . Hyperlipidemia Father   . Hypertension Father   . Heart attack Father     Allergies  Allergen Reactions  . Penicillins     Current outpatient prescriptions:atorvastatin (LIPITOR) 10 MG tablet, Take 1 tablet (10 mg total) by mouth daily at 6 PM., Disp: 60 tablet, Rfl: 3;  diphenhydrAMINE (BENADRYL) 25 mg capsule, Take 25 mg by mouth every 6 (six) hours as needed for itching., Disp: , Rfl: ;  HYDROcodone-acetaminophen (NORCO/VICODIN) 5-325 MG per tablet, Take 1 tablet every 6 hrs as needed for pain, Disp: 20 tablet, Rfl: 0 mupirocin ointment (BACTROBAN) 2 %, 1 Application, nasally, 2 times daily prior to surgery, Disp: 22 g, Rfl: 0;  permethrin (ELIMITE) 5 % cream, Apply 1 application topically once. *apply to entire body except for the  face, let sit for 14 hours, then wash off. May repeat in 1 week if still having symptoms, Disp: , Rfl:  Pramoxine-Calamine (AVEENO ANTI-ITCH) 1-3 % CREA, Apply 1 application topically 2 (two) times daily as needed (for itching)., Disp: , Rfl: ;  pregabalin (LYRICA) 50 MG capsule, Take 1 capsule (50 mg total) by mouth 2 (two) times daily., Disp: 60 capsule, Rfl: 0;  HYDROmorphone (DILAUDID) 2 MG tablet, Take 1-2 tablets (2-4 mg total) by mouth every 4 (four) hours as needed., Disp: 30 tablet, Rfl: 0  BP 115/78  Pulse 50  Ht 6' (1.829 m)  Wt 190 lb (86.183 kg)  BMI 25.76 kg/m2  SpO2 100%  Body mass index is 25.76 kg/(m^2).       On physical exam his surgical incisions are all well healed. He does have a palpable popliteal and palpable posterior tibial pulse on the right. He does have duskiness as described mainly on the third toe but also on the tip of his second toe to some degree  Noninvasive studies today reveal ankle arm index of 0.92 on the right. He has normal waveforms and velocities throughout the popliteal bypass.  Impression and plan: Well-perfused foot with continued severe pain associated with ischemia specifically in the third toe. I explained that this will eventually resolved but has been 4-5 months with severe pain. I did offer amputation  of his third toe as hopefully expediting resolution of his pain. He will consider this understands this would be done as an outpatient. He was written prescription for Percocet 5/325 #40 no refills. He reports that he is down to approximately 7 or 8 cigarettes per day and is continuing to attempt to completely stop the

## 2013-12-04 NOTE — Anesthesia Procedure Notes (Signed)
Procedure Name: LMA Insertion Date/Time: 12/04/2013 8:40 AM Performed by: Coralee RudFLORES, Husna Krone Pre-anesthesia Checklist: Patient identified, Emergency Drugs available, Suction available and Patient being monitored Patient Re-evaluated:Patient Re-evaluated prior to inductionOxygen Delivery Method: Circle system utilized Preoxygenation: Pre-oxygenation with 100% oxygen Intubation Type: IV induction Ventilation: Mask ventilation without difficulty LMA: LMA inserted LMA Size: 5.0 Number of attempts: 1

## 2013-12-06 ENCOUNTER — Encounter (HOSPITAL_COMMUNITY): Payer: Self-pay | Admitting: Vascular Surgery

## 2013-12-09 ENCOUNTER — Telehealth: Payer: Self-pay

## 2013-12-09 ENCOUNTER — Telehealth: Payer: Self-pay | Admitting: *Deleted

## 2013-12-09 NOTE — Telephone Encounter (Signed)
Cody Baird states he was to remove bandage/dressing on Friday/Saturday covering surgical area right foot.  He states the bandage "is stuck" and that he cannot remove it and that it is painful when he tries to do so.  Recommended that he moisten the bandage with water and then gently attempt to remove it.  Advised that he may have to moisten it several times to loosen it before it comes off.  Recommend that he not immerse it in water.  Cody Baird inquired about cleaning the surgical site.  Recommended he use water and Dial soap to gently cleanse the surgical site.  Pt. Verbalized understanding.

## 2013-12-09 NOTE — Telephone Encounter (Signed)
chart opened in error

## 2013-12-10 ENCOUNTER — Telehealth: Payer: Self-pay

## 2013-12-10 DIAGNOSIS — G8918 Other acute postprocedural pain: Secondary | ICD-10-CM

## 2013-12-10 MED ORDER — HYDROCODONE-ACETAMINOPHEN 5-325 MG PO TABS: ORAL_TABLET | ORAL | Status: DC

## 2013-12-10 NOTE — Telephone Encounter (Signed)
Pt. Called to clarify incision care.  Advised to cleanse the incisional area gently with clean wash cloth, daily, with Dial soap and water.  Encouraged to keep area clean / dry.  Advised against submerging right foot in bath tub.  Pt. Questioned if he could have a refill on pain medication.  States pain level is at "3-4" at this time.  States it throbs at night and keeps him awake.  States that overall the pain is improving, but requesting a refill so he can sleep at night.  Discussed with Dr. Arbie CookeyEarly.  Recommended to step pt. down to Hydrocodone/Acetaminophen 5/325 mg.; # 30 ; no refills.  Advised pt. Rx will be ready to pick up at office.

## 2013-12-23 ENCOUNTER — Encounter: Payer: Self-pay | Admitting: Vascular Surgery

## 2013-12-24 ENCOUNTER — Ambulatory Visit (INDEPENDENT_AMBULATORY_CARE_PROVIDER_SITE_OTHER): Payer: Self-pay | Admitting: Vascular Surgery

## 2013-12-24 ENCOUNTER — Encounter: Payer: Self-pay | Admitting: Vascular Surgery

## 2013-12-24 VITALS — BP 103/75 | HR 69 | Temp 98.3°F | Ht 72.0 in | Wt 190.0 lb

## 2013-12-24 DIAGNOSIS — M79673 Pain in unspecified foot: Secondary | ICD-10-CM

## 2013-12-24 DIAGNOSIS — M79609 Pain in unspecified limb: Secondary | ICD-10-CM

## 2013-12-24 DIAGNOSIS — Z48812 Encounter for surgical aftercare following surgery on the circulatory system: Secondary | ICD-10-CM

## 2013-12-24 DIAGNOSIS — I998 Other disorder of circulatory system: Secondary | ICD-10-CM | POA: Insufficient documentation

## 2013-12-24 DIAGNOSIS — I739 Peripheral vascular disease, unspecified: Secondary | ICD-10-CM

## 2013-12-24 HISTORY — DX: Pain in unspecified foot: M79.673

## 2013-12-24 HISTORY — DX: Other disorder of circulatory system: I99.8

## 2013-12-24 MED ORDER — OXYCODONE-ACETAMINOPHEN 5-325 MG PO TABS: 1.0000 | ORAL_TABLET | Freq: Four times a day (QID) | ORAL | Status: DC | PRN

## 2013-12-24 NOTE — Progress Notes (Signed)
[facility]   Subjective  - My right foot still hurts from time to time.  I take 1/2 to 1 pain pill per day.   Objective 103/75 69 98.3 F (36.8 C) (Oral)   97%  Palpable femoral and popliteal graft pulse 2nd ditgit amputation site well healed Great toe and 3 erd digits without drainage Evidence of new nail growth on 1-3 erd digits  Assessment/Planning:   S/P right foot second toe amputation. S/P Fem-pop graft by-pass. F/U in 2 months for ABI and Arterial Duplex of the right LE. He will call with any new concerns He was given a refill on his Percocet by Dr. Fayne MediateEarly   Delila Kuklinski [facility] 12/24/2013 2:10 PM --  Laboratory Lab Results: No results found for this basename: WBC, HGB, HCT, PLT,  in the last 72 hours BMET No results found for this basename: NA, K, CL, CO2, GLUCOSE, BUN, CREATININE, CALCIUM,  in the last 72 hours  COAG Lab Results  Component Value Date   INR 0.98 06/26/2013   No results found for this basename: PTT    I have examined the patient, reviewed and agree with above. Dilatation and pain but better after starting dictation. She does have today. We'll see him again in 2 months for continued followup noninvasive vascular labs  EARLY, [name], MD 12/24/2013 2:31 PM

## 2013-12-24 NOTE — Addendum Note (Signed)
Addended by: Adria DillELDRIDGE-LEWIS, Aundreya Souffrant L on: 12/24/2013 02:53 PM   Modules accepted: Orders

## 2014-01-13 ENCOUNTER — Telehealth: Payer: Self-pay | Admitting: *Deleted

## 2014-01-13 NOTE — Telephone Encounter (Signed)
[name] states that on 01-10-2014 his right fourth toe became very painful to touch.  He states that on 01-11-2014 his right fourth toe starting swelling to "twice its original size" and a blister formed on the end of the fourth toe and the top of his fourth toe became very red . [name] states that he is having difficulty bearing weight on his right foot.  He states that the fourth toe "looks like the third toe did before it was taken off."  Appointment made for [name] to see Dr. Arbie CookeyEarly on 01-14-2014 at 10:45 AM.  [name] aware of appointment date and time and verbalized relief.

## 2014-01-14 ENCOUNTER — Ambulatory Visit (INDEPENDENT_AMBULATORY_CARE_PROVIDER_SITE_OTHER): Payer: Self-pay | Admitting: Vascular Surgery

## 2014-01-14 ENCOUNTER — Encounter: Payer: Self-pay | Admitting: Vascular Surgery

## 2014-01-14 ENCOUNTER — Encounter: Payer: Self-pay | Admitting: *Deleted

## 2014-01-14 ENCOUNTER — Encounter (HOSPITAL_COMMUNITY): Payer: Self-pay | Admitting: *Deleted

## 2014-01-14 ENCOUNTER — Other Ambulatory Visit: Payer: Self-pay | Admitting: *Deleted

## 2014-01-14 ENCOUNTER — Encounter (HOSPITAL_COMMUNITY): Payer: Self-pay | Admitting: Pharmacy Technician

## 2014-01-14 VITALS — BP 116/81 | HR 77 | Resp 18 | Wt 190.0 lb

## 2014-01-14 DIAGNOSIS — I739 Peripheral vascular disease, unspecified: Secondary | ICD-10-CM

## 2014-01-14 DIAGNOSIS — M79609 Pain in unspecified limb: Secondary | ICD-10-CM

## 2014-01-14 DIAGNOSIS — Z48812 Encounter for surgical aftercare following surgery on the circulatory system: Secondary | ICD-10-CM

## 2014-01-14 HISTORY — DX: Encounter for surgical aftercare following surgery on the circulatory system: Z48.812

## 2014-01-14 MED ORDER — VANCOMYCIN HCL IN DEXTROSE 1-5 GM/200ML-% IV SOLN
1000.0000 mg | INTRAVENOUS | Status: AC
  Administered 2014-01-15: 1000 mg via INTRAVENOUS

## 2014-01-14 NOTE — Progress Notes (Signed)
Here today for continued followup of to issues on his right foot. Had seen him several weeks ago and it had good healing of the third toe amputation. He has some eschar of the tip of his fourth toe but no inflammation. Over the past several days this has developed into a swollen painful area. On physical exam there is clearly pus in the distal phalanx no evidence of involvement up into his metatarsal head. The fifth toe has no evidence of ischemia or infection. The third toe is healing.  Continues to have 2-3+ popliteal graft pulse.  Impression and plan of action in the distal fourth toe on the right foot. Ex-preemie will require take this for healing. With the third and fourth toe amputation explained that he will not be a tolerate having his fifth toe at present since it will the large aggravation tracing socks or even walking barefoot. I recommended fourth and fifth toe amputation. Do not feel this will extend into the metatarsal head. He understands and we will plan for surgery tomorrowwith hanging up 1  Past Medical History  Diagnosis Date  . Prolapsed, anus   . Peripheral vascular disease   . PAD (peripheral artery disease)   . Shortness of breath     with exertion  . ZOXWRUEA(540.9Headache(784.0)     History  Substance Use Topics  . Smoking status: Current Every Day Smoker -- 0.50 packs/day for 35 years    Types: Cigarettes  . Smokeless tobacco: Not on file  . Alcohol Use: 2.4 oz/week    4 Cans of beer per week    Family History  Problem Relation Age of Onset  . Cancer Mother   . Diabetes Mother   . Diabetes Father   . Hyperlipidemia Father   . Hypertension Father   . Heart attack Father     Allergies  Allergen Reactions  . Penicillins Other (See Comments)    Childhood allergy not sure what the reaction is       Current outpatient prescriptions:diphenhydrAMINE (BENADRYL) 25 mg capsule, Take 25 mg by mouth every 6 (six) hours as needed for allergies. , Disp: , Rfl: ;   HYDROcodone-acetaminophen (NORCO/VICODIN) 5-325 MG per tablet, 1-2 tablets every 6 hrs/ prn/ pain, Disp: 30 tablet, Rfl: 0;  oxyCODONE-acetaminophen (PERCOCET/ROXICET) 5-325 MG per tablet, Take 1 tablet by mouth every 6 (six) hours as needed., Disp: 30 tablet, Rfl: 0 oxyCODONE-acetaminophen (ROXICET) 5-325 MG per tablet, Take 1-2 tablets by mouth every 4 (four) hours as needed for severe pain., Disp: 40 tablet, Rfl: 0  BP 116/81  Pulse 77  Resp 18  Wt 190 lb (86.183 kg)  Body mass index is 25.76 kg/(m^2).

## 2014-01-15 ENCOUNTER — Ambulatory Visit (HOSPITAL_COMMUNITY): Payer: Self-pay | Admitting: Anesthesiology

## 2014-01-15 ENCOUNTER — Encounter (HOSPITAL_COMMUNITY): Payer: Self-pay | Admitting: *Deleted

## 2014-01-15 ENCOUNTER — Encounter (HOSPITAL_COMMUNITY)
Admission: RE | Disposition: A | Discharge status: Home / Self Care | Payer: Self-pay | Source: Ambulatory Visit | Attending: Vascular Surgery

## 2014-01-15 ENCOUNTER — Encounter (HOSPITAL_COMMUNITY): Payer: MEDICAID | Admitting: Anesthesiology

## 2014-01-15 ENCOUNTER — Telehealth: Payer: Self-pay | Admitting: Vascular Surgery

## 2014-01-15 ENCOUNTER — Ambulatory Visit (HOSPITAL_COMMUNITY)
Admission: RE | Admit: 2014-01-15 | Discharge: 2014-01-15 | Disposition: A | Discharge status: Home / Self Care | Payer: Self-pay | Source: Ambulatory Visit | Attending: Vascular Surgery | Admitting: Vascular Surgery

## 2014-01-15 DIAGNOSIS — I96 Gangrene, not elsewhere classified: Secondary | ICD-10-CM | POA: Insufficient documentation

## 2014-01-15 DIAGNOSIS — F17200 Nicotine dependence, unspecified, uncomplicated: Secondary | ICD-10-CM | POA: Insufficient documentation

## 2014-01-15 DIAGNOSIS — I739 Peripheral vascular disease, unspecified: Secondary | ICD-10-CM | POA: Insufficient documentation

## 2014-01-15 DIAGNOSIS — Z880 Allergy status to penicillin: Secondary | ICD-10-CM | POA: Insufficient documentation

## 2014-01-15 DIAGNOSIS — I70269 Atherosclerosis of native arteries of extremities with gangrene, unspecified extremity: Secondary | ICD-10-CM

## 2014-01-15 HISTORY — DX: Personal history of other diseases of the respiratory system: Z87.09

## 2014-01-15 HISTORY — DX: Unspecified osteoarthritis, unspecified site: M19.90

## 2014-01-15 HISTORY — DX: Dorsalgia, unspecified: M54.9

## 2014-01-15 HISTORY — DX: Other chronic pain: G89.29

## 2014-01-15 HISTORY — PX: AMPUTATION: SHX166

## 2014-01-15 LAB — BASIC METABOLIC PANEL
BUN: 11 mg/dL (ref 6–23)
CHLORIDE: 101 meq/L (ref 96–112)
CO2: 24 mEq/L (ref 19–32)
Calcium: 9.7 mg/dL (ref 8.4–10.5)
Creatinine, Ser: 0.84 mg/dL (ref 0.50–1.35)
GLUCOSE: 95 mg/dL (ref 70–99)
POTASSIUM: 4.1 meq/L (ref 3.7–5.3)
SODIUM: 139 meq/L (ref 137–147)

## 2014-01-15 LAB — CBC
HEMATOCRIT: 50.8 % (ref 39.0–52.0)
Hemoglobin: 18.1 g/dL — ABNORMAL HIGH (ref 13.0–17.0)
MCH: 33.3 pg (ref 26.0–34.0)
MCHC: 35.6 g/dL (ref 30.0–36.0)
MCV: 93.6 fL (ref 78.0–100.0)
PLATELETS: 191 10*3/uL (ref 150–400)
RBC: 5.43 MIL/uL (ref 4.22–5.81)
RDW: 13.1 % (ref 11.5–15.5)
WBC: 10.7 10*3/uL — AB (ref 4.0–10.5)

## 2014-01-15 SURGERY — AMPUTATION DIGIT
Anesthesia: General | Site: Toe | Laterality: Right

## 2014-01-15 MED ORDER — LIDOCAINE HCL (CARDIAC) 20 MG/ML IV SOLN
INTRAVENOUS | Status: DC | PRN
  Administered 2014-01-15: 60 mg via INTRAVENOUS

## 2014-01-15 MED ORDER — OXYCODONE HCL 5 MG/5ML PO SOLN: 5.0000 mg | Freq: Once | ORAL | Status: AC | PRN

## 2014-01-15 MED ORDER — VANCOMYCIN HCL IN DEXTROSE 1-5 GM/200ML-% IV SOLN
INTRAVENOUS | Status: AC
  Filled 2014-01-15: qty 200

## 2014-01-15 MED ORDER — OXYCODONE HCL 5 MG PO TABS
5.0000 mg | ORAL_TABLET | Freq: Once | ORAL | Status: AC | PRN
  Administered 2014-01-15: 5 mg via ORAL

## 2014-01-15 MED ORDER — PROPOFOL 10 MG/ML IV BOLUS
INTRAVENOUS | Status: DC | PRN
Start: 2014-01-15 — End: 2014-01-15
  Administered 2014-01-15: 100 mg via INTRAVENOUS
  Administered 2014-01-15: 200 mg via INTRAVENOUS

## 2014-01-15 MED ORDER — 0.9 % SODIUM CHLORIDE (POUR BTL) OPTIME
TOPICAL | Status: DC | PRN
  Administered 2014-01-15: 1000 mL

## 2014-01-15 MED ORDER — MIDAZOLAM HCL 2 MG/2ML IJ SOLN
INTRAMUSCULAR | Status: AC
  Filled 2014-01-15: qty 2

## 2014-01-15 MED ORDER — LIDOCAINE HCL (CARDIAC) 20 MG/ML IV SOLN
INTRAVENOUS | Status: AC
  Filled 2014-01-15: qty 5

## 2014-01-15 MED ORDER — ONDANSETRON HCL 4 MG/2ML IJ SOLN
INTRAMUSCULAR | Status: DC | PRN
  Administered 2014-01-15: 4 mg via INTRAVENOUS

## 2014-01-15 MED ORDER — OXYCODONE-ACETAMINOPHEN 5-325 MG PO TABS: 1.0000 | ORAL_TABLET | Freq: Four times a day (QID) | ORAL | Status: DC | PRN

## 2014-01-15 MED ORDER — MIDAZOLAM HCL 5 MG/5ML IJ SOLN
INTRAMUSCULAR | Status: DC | PRN
  Administered 2014-01-15: 2 mg via INTRAVENOUS

## 2014-01-15 MED ORDER — PROPOFOL 10 MG/ML IV BOLUS
INTRAVENOUS | Status: AC
Start: 2014-01-15 — End: 2014-01-15
  Filled 2014-01-15: qty 20

## 2014-01-15 MED ORDER — LIDOCAINE HCL (PF) 1 % IJ SOLN
INTRAMUSCULAR | Status: AC
  Filled 2014-01-15: qty 30

## 2014-01-15 MED ORDER — FENTANYL CITRATE 0.05 MG/ML IJ SOLN
INTRAMUSCULAR | Status: AC
  Administered 2014-01-15: 50 ug via INTRAVENOUS
  Filled 2014-01-15: qty 2

## 2014-01-15 MED ORDER — LACTATED RINGERS IV SOLN: INTRAVENOUS | Status: DC

## 2014-01-15 MED ORDER — FENTANYL CITRATE 0.05 MG/ML IJ SOLN
INTRAMUSCULAR | Status: DC | PRN
  Administered 2014-01-15 (×2): 50 ug via INTRAVENOUS
  Administered 2014-01-15 (×3): 25 ug via INTRAVENOUS
  Administered 2014-01-15: 50 ug via INTRAVENOUS
  Administered 2014-01-15: 25 ug via INTRAVENOUS

## 2014-01-15 MED ORDER — ONDANSETRON HCL 4 MG/2ML IJ SOLN
INTRAMUSCULAR | Status: AC
  Filled 2014-01-15: qty 2

## 2014-01-15 MED ORDER — PROPOFOL 10 MG/ML IV BOLUS
INTRAVENOUS | Status: AC
  Filled 2014-01-15: qty 20

## 2014-01-15 MED ORDER — SODIUM CHLORIDE 0.9 % IV SOLN: INTRAVENOUS | Status: DC

## 2014-01-15 MED ORDER — CHLORHEXIDINE GLUCONATE CLOTH 2 % EX PADS: 6.0000 | MEDICATED_PAD | Freq: Once | CUTANEOUS | Status: DC

## 2014-01-15 MED ORDER — FENTANYL CITRATE 0.05 MG/ML IJ SOLN
INTRAMUSCULAR | Status: AC
  Filled 2014-01-15: qty 2

## 2014-01-15 MED ORDER — LACTATED RINGERS IV SOLN
INTRAVENOUS | Status: DC
Start: 2014-01-15 — End: 2014-01-15
  Administered 2014-01-15 (×2): via INTRAVENOUS

## 2014-01-15 MED ORDER — OXYCODONE HCL 5 MG PO TABS
ORAL_TABLET | ORAL | Status: AC
  Administered 2014-01-15: 5 mg via ORAL
  Filled 2014-01-15: qty 1

## 2014-01-15 MED ORDER — FENTANYL CITRATE 0.05 MG/ML IJ SOLN
INTRAMUSCULAR | Status: AC
  Filled 2014-01-15: qty 5

## 2014-01-15 MED ORDER — FENTANYL CITRATE 0.05 MG/ML IJ SOLN
25.0000 ug | INTRAMUSCULAR | Status: DC | PRN
  Administered 2014-01-15 (×3): 50 ug via INTRAVENOUS

## 2014-01-15 MED ORDER — ONDANSETRON HCL 4 MG/2ML IJ SOLN: 4.0000 mg | Freq: Four times a day (QID) | INTRAMUSCULAR | Status: DC | PRN

## 2014-01-15 SURGICAL SUPPLY — 37 items
BANDAGE CONFORM 2  STR LF (GAUZE/BANDAGES/DRESSINGS) ×3 IMPLANT
BANDAGE GAUZE ELAST BULKY 4 IN (GAUZE/BANDAGES/DRESSINGS) ×3 IMPLANT
BNDG GAUZE ELAST 4 BULKY (GAUZE/BANDAGES/DRESSINGS) ×3 IMPLANT
CANISTER SUCTION 2500CC (MISCELLANEOUS) ×3 IMPLANT
CLIP LIGATING EXTRA MED SLVR (CLIP) ×3 IMPLANT
CLIP LIGATING EXTRA SM BLUE (MISCELLANEOUS) ×3 IMPLANT
COVER SURGICAL LIGHT HANDLE (MISCELLANEOUS) ×3 IMPLANT
DRAPE EXTREMITY T 121X128X90 (DRAPE) ×3 IMPLANT
ELECT REM PT RETURN 9FT ADLT (ELECTROSURGICAL) ×3
ELECTRODE REM PT RTRN 9FT ADLT (ELECTROSURGICAL) ×1 IMPLANT
GAUZE SPONGE 2X2 8PLY STRL LF (GAUZE/BANDAGES/DRESSINGS) IMPLANT
GLOVE BIOGEL PI IND STRL 6.5 (GLOVE) ×1 IMPLANT
GLOVE BIOGEL PI INDICATOR 6.5 (GLOVE) ×2
GLOVE SS BIOGEL STRL SZ 7.5 (GLOVE) ×1 IMPLANT
GLOVE SUPERSENSE BIOGEL SZ 7.5 (GLOVE) ×2
GLOVE SURG SS PI 7.0 STRL IVOR (GLOVE) ×3 IMPLANT
GOWN STRL REUS W/ TWL LRG LVL3 (GOWN DISPOSABLE) ×3 IMPLANT
GOWN STRL REUS W/TWL LRG LVL3 (GOWN DISPOSABLE) ×6
KIT BASIN OR (CUSTOM PROCEDURE TRAY) ×3 IMPLANT
KIT ROOM TURNOVER OR (KITS) ×3 IMPLANT
NEEDLE 22X1 1/2 (OR ONLY) (NEEDLE) IMPLANT
NS IRRIG 1000ML POUR BTL (IV SOLUTION) ×3 IMPLANT
PACK GENERAL/GYN (CUSTOM PROCEDURE TRAY) ×3 IMPLANT
PAD ARMBOARD 7.5X6 YLW CONV (MISCELLANEOUS) ×6 IMPLANT
SPECIMEN JAR SMALL (MISCELLANEOUS) ×3 IMPLANT
SPONGE GAUZE 2X2 STER 10/PKG (GAUZE/BANDAGES/DRESSINGS)
SPONGE GAUZE 4X4 12PLY (GAUZE/BANDAGES/DRESSINGS) IMPLANT
SPONGE GAUZE 4X4 12PLY STER LF (GAUZE/BANDAGES/DRESSINGS) ×3 IMPLANT
SUT ETHILON 3 0 PS 1 (SUTURE) ×3 IMPLANT
SUT VIC AB 3-0 SH 27 (SUTURE) ×2
SUT VIC AB 3-0 SH 27X BRD (SUTURE) ×1 IMPLANT
SYR CONTROL 10ML LL (SYRINGE) IMPLANT
TOWEL OR 17X24 6PK STRL BLUE (TOWEL DISPOSABLE) ×3 IMPLANT
TOWEL OR 17X26 10 PK STRL BLUE (TOWEL DISPOSABLE) ×3 IMPLANT
TUBE ANAEROBIC SPECIMEN COL (MISCELLANEOUS) IMPLANT
UNDERPAD 30X30 INCONTINENT (UNDERPADS AND DIAPERS) ×3 IMPLANT
WATER STERILE IRR 1000ML POUR (IV SOLUTION) ×3 IMPLANT

## 2014-01-15 NOTE — Op Note (Signed)
    OPERATIVE REPORT  DATE OF SURGERY: 01/15/2014  PATIENT: Cody Baird, 53 y.o. male MRN: [id number]  DOB: 13-Aug-1961  PRE-OPERATIVE DIAGNOSIS: Gangrene right fourth toe  POST-OPERATIVE DIAGNOSIS:  Same  PROCEDURE: Right fourth and fifth toe amputation  SURGEON:  Gretta Beganodd Lindzy Rupert, M.D.  PHYSICIAN ASSISTANT: Nurse  ANESTHESIA:  Gen.  EBL: Minimal ml  Total I/O In: 1000 [I.V.:1000] Out: 10 [Blood:10]  BLOOD ADMINISTERED: None  DRAINS: None  SPECIMEN: Right fourth and fifth toe  COUNTS CORRECT:  YES  PLAN OF CARE: PACU   PATIENT DISPOSITION:  PACU - hemodynamically stable  PROCEDURE DETAILS: Patient is status post right femoral to popliteal bypass for critical limb ischemia several months ago. He developed gangrenous changes of the tips of the toes of his right foot. These have been healing but he didn't develop erythema and severe pain in his third toe several weeks ago underwent amputation of this. He had been seen back in the office with good healing. He presented to our office yesterday with new a gangrenous changes of the tip of his fourth toe. This had become swollen with purulent drainage. His recommended he undergo fourth toe amputation. His explained that if he had third and fourth toe amputation at that without tension fifth toes to be very disabling to him for a hangup with placing Ciloxan walking. He is recommended anterior fourth and fifth toe amputation.  The foot was prepped and draped in sterile fashion. A fishmouth incision was made at the base of the fourth and fifth toes and carried down to the level of the digital bone. The bone was divided with a bone shear the support of the toe specimens were passed off the field. There was good bleeding in this was controlled with electrocautery. The Fredrik CoveRoger was used to debride the bone further proximally. Once ulcer edges were smoothed the wound irrigated with saline and hemostasis tablet cautery. Wounds were closed with 3-0  Vicryl in the subcuticular tissue. The skin was closed with 30 mattress nylon sutures. Sterile dressing was applied the patient was taken to the recovery room in stable condition   Gretta Beganodd Mikias Lanz, M.D. 01/15/2014 1:58 PM

## 2014-01-15 NOTE — Telephone Encounter (Addendum)
Message copied by Rosalyn ChartersOUX, BONNIE A on Wed Jan 15, 2014  2:54 PM ------      Message from: Lorin MercyMCCHESNEY, MARILYN K      Created: Wed Jan 15, 2014  1:47 PM      Regarding: Schedule                   ----- Message -----         From: Lars MageEmma M Collins, PA-C         Sent: 01/15/2014   1:43 PM           To: [facility] Charge Pool            F/U with Dr. Arbie CookeyEarly in 3 weeks s/p toe amputations right foot. ------  notified patient of fu appt. on 02-04-14 at 8:30

## 2014-01-15 NOTE — Interval H&P Note (Signed)
History and Physical Interval Note:  01/15/2014 8:09 AM  Cody Baird  has presented today for surgery, with the diagnosis of Gangrene of Right toes 4 and 5  The various methods of treatment have been discussed with the patient and family. After consideration of risks, benefits and other options for treatment, the patient has consented to  Procedure(s): AMPUTATION OF RIGHT 4TH and 5TH TOES (Right) as a surgical intervention .  The patient's history has been reviewed, patient examined, no change in status, stable for surgery.  I have reviewed the patient's chart and labs.  Questions were answered to the patient's satisfaction.     Sylina Henion

## 2014-01-15 NOTE — Transfer of Care (Signed)
Immediate Anesthesia Transfer of Care Note  Patient: Cody Baird  Procedure(s) Performed: Procedure(s): AMPUTATION OF RIGHT 4TH and 5TH TOES (Right)  Patient Location: PACU  Anesthesia Type:General  Level of Consciousness: awake, alert , oriented and patient cooperative  Airway & Oxygen Therapy: Patient Spontanous Breathing and Patient connected to nasal cannula oxygen  Post-op Assessment: Report given to PACU RN, Post -op Vital signs reviewed and stable and Patient moving all extremities  Post vital signs: Reviewed and stable  Complications: No apparent anesthesia complications

## 2014-01-15 NOTE — H&P (View-Only) (Signed)
Here today for continued followup of to issues on his right foot. Had seen him several weeks ago and it had good healing of the third toe amputation. He has some eschar of the tip of his fourth toe but no inflammation. Over the past several days this has developed into a swollen painful area. On physical exam there is clearly pus in the distal phalanx no evidence of involvement up into his metatarsal head. The fifth toe has no evidence of ischemia or infection. The third toe is healing.  Continues to have 2-3+ popliteal graft pulse.  Impression and plan of action in the distal fourth toe on the right foot. Ex-preemie will require take this for healing. With the third and fourth toe amputation explained that he will not be a tolerate having his fifth toe at present since it will the large aggravation tracing socks or even walking barefoot. I recommended fourth and fifth toe amputation. Do not feel this will extend into the metatarsal head. He understands and we will plan for surgery tomorrowwith hanging up 1  Past Medical History  Diagnosis Date  . Prolapsed, anus   . Peripheral vascular disease   . PAD (peripheral artery disease)   . Shortness of breath     with exertion  . Headache(784.0)     History  Substance Use Topics  . Smoking status: Current Every Day Smoker -- 0.50 packs/day for 35 years    Types: Cigarettes  . Smokeless tobacco: Not on file  . Alcohol Use: 2.4 oz/week    4 Cans of beer per week    Family History  Problem Relation Age of Onset  . Cancer Mother   . Diabetes Mother   . Diabetes Father   . Hyperlipidemia Father   . Hypertension Father   . Heart attack Father     Allergies  Allergen Reactions  . Penicillins Other (See Comments)    Childhood allergy not sure what the reaction is       Current outpatient prescriptions:diphenhydrAMINE (BENADRYL) 25 mg capsule, Take 25 mg by mouth every 6 (six) hours as needed for allergies. , Disp: , Rfl: ;   HYDROcodone-acetaminophen (NORCO/VICODIN) 5-325 MG per tablet, 1-2 tablets every 6 hrs/ prn/ pain, Disp: 30 tablet, Rfl: 0;  oxyCODONE-acetaminophen (PERCOCET/ROXICET) 5-325 MG per tablet, Take 1 tablet by mouth every 6 (six) hours as needed., Disp: 30 tablet, Rfl: 0 oxyCODONE-acetaminophen (ROXICET) 5-325 MG per tablet, Take 1-2 tablets by mouth every 4 (four) hours as needed for severe pain., Disp: 40 tablet, Rfl: 0  BP 116/81  Pulse 77  Resp 18  Wt 190 lb (86.183 kg)  Body mass index is 25.76 kg/(m^2).        

## 2014-01-15 NOTE — Preoperative (Signed)
Beta Blockers   Reason not to administer Beta Blockers:Not Applicable 

## 2014-01-15 NOTE — Anesthesia Procedure Notes (Signed)
Procedure Name: LMA Insertion Date/Time: 01/15/2014 12:42 PM Performed by: Jerilee HohMUMM, Jaelle Campanile N Pre-anesthesia Checklist: Emergency Drugs available, Patient being monitored, Suction available and Patient identified Patient Re-evaluated:Patient Re-evaluated prior to inductionOxygen Delivery Method: Circle system utilized Preoxygenation: Pre-oxygenation with 100% oxygen Intubation Type: IV induction LMA: LMA inserted LMA Size: 5.0 Tube type: Oral Number of attempts: 1 Placement Confirmation: positive ETCO2 and breath sounds checked- equal and bilateral Tube secured with: Tape Dental Injury: Teeth and Oropharynx as per pre-operative assessment

## 2014-01-15 NOTE — Anesthesia Postprocedure Evaluation (Signed)
Anesthesia Post Note  Patient: Cody Baird  Procedure(s) Performed: Procedure(s) (LRB): AMPUTATION OF RIGHT 4TH and 5TH TOES (Right)  Anesthesia type: General  Patient location: PACU  Post pain: Pain level controlled and Adequate analgesia  Post assessment: Post-op Vital signs reviewed, Patient's Cardiovascular Status Stable, Respiratory Function Stable, Patent Airway and Pain level controlled  Last Vitals:  Filed Vitals:   01/15/14 1445  BP: 126/83  Pulse: 72  Temp: 36.5 C  Resp: 16    Post vital signs: Reviewed and stable  Level of consciousness: awake, alert  and oriented  Complications: No apparent anesthesia complications

## 2014-01-15 NOTE — Anesthesia Preprocedure Evaluation (Signed)
Anesthesia Evaluation  Patient identified by MRN, date of birth, ID band Patient awake    Reviewed: Allergy & Precautions, H&P , NPO status , Patient's Chart, lab work & pertinent test results  Airway Mallampati: II  Neck ROM: full    Dental   Pulmonary shortness of breath, Current Smoker,          Cardiovascular + Peripheral Vascular Disease     Neuro/Psych  Headaches,    GI/Hepatic   Endo/Other    Renal/GU      Musculoskeletal  (+) Arthritis -,   Abdominal   Peds  Hematology   Anesthesia Other Findings   Reproductive/Obstetrics                           Anesthesia Physical  Anesthesia Plan  ASA: II  Anesthesia Plan: General   Post-op Pain Management:    Induction: Intravenous  Airway Management Planned: LMA  Additional Equipment:   Intra-op Plan:   Post-operative Plan:   Informed Consent: I have reviewed the patients History and Physical, chart, labs and discussed the procedure including the risks, benefits and alternatives for the proposed anesthesia with the patient or authorized representative who has indicated his/her understanding and acceptance.     Plan Discussed with: CRNA, Anesthesiologist and Surgeon  Anesthesia Plan Comments:         Anesthesia Quick Evaluation

## 2014-01-16 ENCOUNTER — Encounter (HOSPITAL_COMMUNITY): Payer: Self-pay | Admitting: Vascular Surgery

## 2014-01-28 ENCOUNTER — Telehealth: Payer: Self-pay

## 2014-01-28 DIAGNOSIS — G8918 Other acute postprocedural pain: Secondary | ICD-10-CM

## 2014-01-28 MED ORDER — OXYCODONE-ACETAMINOPHEN 5-325 MG PO TABS: ORAL_TABLET | ORAL | Status: DC

## 2014-01-28 NOTE — Telephone Encounter (Signed)
Phone call from pt.  Requesting refill on Oxycodone/Acetaminophen 5/325 mg tablets. Reports he is only using 1.5 -2 tablets / day.  Denies redness or drainage @ toe amp site.  States the incision is intact.  Rates pain at this time at "3-4" on 1-10 scale.  Discussed with Dr. Arbie CookeyEarly.  Gave verbal ok to order the above ; # 30 tabs; no refills.

## 2014-02-03 ENCOUNTER — Encounter: Payer: Self-pay | Admitting: Vascular Surgery

## 2014-02-04 ENCOUNTER — Ambulatory Visit (INDEPENDENT_AMBULATORY_CARE_PROVIDER_SITE_OTHER): Payer: Self-pay | Admitting: Vascular Surgery

## 2014-02-04 ENCOUNTER — Encounter: Payer: Self-pay | Admitting: Vascular Surgery

## 2014-02-04 VITALS — BP 118/92 | HR 100 | Temp 98.7°F | Ht 72.0 in | Wt 193.0 lb

## 2014-02-04 DIAGNOSIS — I998 Other disorder of circulatory system: Secondary | ICD-10-CM

## 2014-02-04 DIAGNOSIS — I999 Unspecified disorder of circulatory system: Secondary | ICD-10-CM

## 2014-02-04 DIAGNOSIS — M79609 Pain in unspecified limb: Secondary | ICD-10-CM

## 2014-02-04 DIAGNOSIS — I739 Peripheral vascular disease, unspecified: Secondary | ICD-10-CM

## 2014-02-04 DIAGNOSIS — M79673 Pain in unspecified foot: Secondary | ICD-10-CM

## 2014-02-04 NOTE — Progress Notes (Signed)
Here today for followup of his right fourth and fifth toe amputation on 01/15/2014. 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 He continues to have significant pain associated with his right foot. He is trying to quit smoking and has dropped this down dramatically. I again explained the critical importance of smoking cessation.  He does have some cyanotic changes in his great and second toe on the right. He has easily palpable popliteal graft pulse and posterior tibial pulse with a patent bypass. Does have some superficial blistering at the fifth toe and web space between the fourth and fifth toe. This is partial thickness only. Had removal of his sutures today.  ] Continued slow healing of his foot. As she does have some cyanotic changes in the toes of his left foot as well. He was given a prescription for Percocet 5/325 #40 no refills. He is trying to reduce this to one or 2 pills per day particularly at night when he is having pain. We will see him again in one month for continued follow

## 2014-03-03 ENCOUNTER — Encounter: Payer: Self-pay | Admitting: Vascular Surgery

## 2014-03-04 ENCOUNTER — Other Ambulatory Visit (HOSPITAL_COMMUNITY): Payer: Self-pay

## 2014-03-04 ENCOUNTER — Ambulatory Visit (INDEPENDENT_AMBULATORY_CARE_PROVIDER_SITE_OTHER): Payer: Self-pay | Admitting: Vascular Surgery

## 2014-03-04 ENCOUNTER — Encounter: Payer: Self-pay | Admitting: Vascular Surgery

## 2014-03-04 ENCOUNTER — Encounter (HOSPITAL_COMMUNITY): Payer: Self-pay

## 2014-03-04 VITALS — BP 115/84 | HR 81 | Ht 72.0 in | Wt 198.6 lb

## 2014-03-04 DIAGNOSIS — I739 Peripheral vascular disease, unspecified: Secondary | ICD-10-CM

## 2014-03-04 DIAGNOSIS — Z48812 Encounter for surgical aftercare following surgery on the circulatory system: Secondary | ICD-10-CM

## 2014-03-04 NOTE — Progress Notes (Signed)
Here today for continued discussion regarding his right femoropopliteal bypass from August 2014 and subsequent toe amputations. Fortunately has had marked resolution of his severe foot pain since his last visit with medial with suture removal on 02/04/2014. He reports that he has not taken pain medications since then.  On physical exam he does have a palpable popliteal graft pulse and also 2+ posterior tibial pulse. Palpitations are completely healed.  Impression and plan continued to progress with his recovery. We'll continue his walking program. Reports a markedly diminished in the since his surgery. We'll see him again in 3 months with noninvasive graft followup

## 2014-03-04 NOTE — Addendum Note (Signed)
Addended by: Adria DillELDRIDGE-LEWIS, Feliz Lincoln L on: 03/04/2014 04:17 PM   Modules accepted: Orders

## 2014-06-09 ENCOUNTER — Encounter: Payer: Self-pay | Admitting: Vascular Surgery

## 2014-06-10 ENCOUNTER — Ambulatory Visit (HOSPITAL_COMMUNITY)
Admission: RE | Admit: 2014-06-10 | Discharge: 2014-06-10 | Disposition: A | Discharge status: Home / Self Care | Payer: Self-pay | Source: Ambulatory Visit | Attending: Vascular Surgery | Admitting: Vascular Surgery

## 2014-06-10 ENCOUNTER — Encounter: Payer: Self-pay | Admitting: Vascular Surgery

## 2014-06-10 ENCOUNTER — Ambulatory Visit (INDEPENDENT_AMBULATORY_CARE_PROVIDER_SITE_OTHER): Payer: Self-pay | Admitting: Vascular Surgery

## 2014-06-10 VITALS — BP 118/84 | HR 61 | Ht 72.0 in | Wt 209.0 lb

## 2014-06-10 DIAGNOSIS — Z48812 Encounter for surgical aftercare following surgery on the circulatory system: Secondary | ICD-10-CM

## 2014-06-10 DIAGNOSIS — I739 Peripheral vascular disease, unspecified: Secondary | ICD-10-CM

## 2014-06-10 NOTE — Progress Notes (Signed)
[facility] [place] HISTORY AND PHYSICAL   CC:  Follow-up femoropopliteal bypass No ref. provider found  HPI: This is a 53 y.o. male who is status post right femoropopliteal bypass in August 2014 and is here for follow-up. He complains of pain in his right foot daily and intermittent pain in his right calf when walking.  He describes his foot pain as a "rubber band squeezed around the foot." The pain has worsened since his last visit on 03/04/2014. He has been taking 1/2 a percocet every few days for pain due to inability to sleep at night. He still smokes 8 cigarettes a day.   He denies any non-healing wounds of his lower extremity.   Past Medical History  Diagnosis Date  . Prolapsed, anus     hx of  . Peripheral vascular disease   . PAD (peripheral artery disease)   . History of bronchitis     last time about 2-13yrs ago  . Headache(784.0)     occasionally and sinus related   . Arthritis     low back and left hip  . Chronic back pain    Past Surgical History  Procedure Laterality Date  . Back surgery      L1-2  . Back surgery      L1-2  . Back surgery      thinks L1- 2  . Colon surgery      removal of colon due to proplase rectum  . Colostomy    . Colostomy closure  2006ish  . Femoral-tibial bypass graft Right 06/28/2013    Procedure: BYPASS GRAFT FEMORAL TO TIBIAL PERONEAL TRUNK ARTERY;  Surgeon: Larina Earthly, MD;  Location: [facility] OR;  Service: Vascular;  Laterality: Right;  . Amputation Right 12/04/2013    Procedure: AMPUTATION DIGIT- 3RD TOE RIGHT FOOT ;  Surgeon: Larina Earthly, MD;  Location: [facility] OR;  Service: Vascular;  Laterality: Right;  . Esophagogastroduodenoscopy    . Amputation Right 01/15/2014    Procedure: AMPUTATION OF RIGHT 4TH and 5TH TOES;  Surgeon: Larina Earthly, MD;  Location: [facility] OR;  Service: Vascular;  Laterality: Right;    Allergies  Allergen Reactions  . Penicillins Other (See Comments)    Childhood allergy not sure what the  reaction is       Current Outpatient Prescriptions  Medication Sig Dispense Refill  . oxyCODONE-acetaminophen (PERCOCET/ROXICET) 5-325 MG per tablet Take 1-2 tablets every 6 hrs./ prn/ pain  30 tablet  0  . diphenhydrAMINE (BENADRYL) 25 mg capsule Take 25 mg by mouth every 6 (six) hours as needed for allergies.        No current facility-administered medications for this visit.    Family History  Problem Relation Age of Onset  . Cancer Mother   . Diabetes Mother   . Diabetes Father   . Hyperlipidemia Father   . Hypertension Father   . Heart attack Father     History   Social History  . Marital Status: Divorced    Spouse Name: N/A    Number of Children: N/A  . Years of Education: N/A   Occupational History  . Not on file.   Social History Main Topics  . Smoking status: Current Every Day Smoker -- 0.50 packs/day for 36 years    Types: Cigarettes  . Smokeless tobacco: Not on file  . Alcohol Use: No  . Drug Use: No  . Sexual Activity: Not on file   Other Topics Concern  .  Not on file   Social History Narrative  . No narrative on file     ROS: [x]  Positive   [ ]  Negative   [ ]  All sytems reviewed and are negative  Cardiovascular: []  chest pain/pressure []  palpitations []  SOB lying flat []  DOE [x]  pain in legs while walking [x]  pain in feet when lying flat []  hx of DVT []  hx of phlebitis []  swelling in legs []  varicose veins  Pulmonary: []  productive cough []  asthma []  wheezing  Neurologic: []  weakness in []  arms []  legs []  numbness in []  arms []  legs [] difficulty speaking or slurred speech []  temporary loss of vision in one eye []  dizziness  Hematologic: []  bleeding problems []  problems with blood clotting easily  GI []  vomiting blood []  blood in stool  GU: []  burning with urination []  blood in urine  Psychiatric: []  hx of major depression  Integumentary: []  rashes []  ulcers  Constitutional: []  fever []  chills   PHYSICAL  EXAMINATION:  Filed Vitals:   06/10/14 1357  BP: 118/84  Pulse: 61   Body mass index is 28.34 kg/(m^2).  General:  WDWN in NAD Vascular: palpable right femoral pulse. Unable to palpate right popliteal, dorsalis pedis, and posterior tibialis pulses Extremities: duskiness to right foot. Small 2 mm eschar on 2nd right toe. Amputations of 3rd-5th toe. No non-healing wounds seen.   Non-Invasive Vascular Imaging:   ABIs 06/10/14  RLE 0.55 Monophasic DP and PT LLE 1.12 Triphasic DP and PT   ASSESSMENT: 53 y.o. male s/p right femoropopliteal bypass from August 2014 and 3rd-5th toe amputations March 2015.   PLAN: -ABIs today reveal 0.55 in the right lower extremity, previously 0.94 on 11/19/2013. He does not have limb threatening ischemia. Discussed option to do an arteriogram of his right lower extremity or observation with follow-up in three months. The patient has decided to return to the office in three months. He was prescribed percocet 5/325 # 40, no RFs for pain control. He was advised to return sooner if his symptoms got worse.    Maris BergerKimberly Trinh, PA-C [facility] [phone]  Clinic MD:  Pt seen and examined in conjunction with Dr. Arbie CookeyEarly  I have examined the patient, reviewed and agree with above. The patient has had persistent pain since his surgery on this one year ago. He does have dropped ankle arm indices in his right leg today compared to his last study. He is able to tolerate his level of claudication and discomfort. I explained to next step would be a repeat arteriogram if we decided to further evaluate the diminished ankle arm indices. He does not have critical ischemia and currently wishes continued observation only. He is taking occasional half Percocet to help sleep. Has not aspirin refill since her last visit several months ago. Was given refill 40 Percocet 03/16/2024 today. We'll see him again in 3 months for repeat ABIs discussion  Destyne Goodreau,  MD 06/10/2014 4:40 PM

## 2014-09-15 ENCOUNTER — Encounter: Payer: Self-pay | Admitting: Vascular Surgery

## 2014-09-16 ENCOUNTER — Ambulatory Visit (HOSPITAL_COMMUNITY)
Admission: RE | Admit: 2014-09-16 | Discharge: 2014-09-16 | Disposition: A | Discharge status: Home / Self Care | Payer: Self-pay | Source: Ambulatory Visit | Attending: Vascular Surgery | Admitting: Vascular Surgery

## 2014-09-16 ENCOUNTER — Ambulatory Visit (INDEPENDENT_AMBULATORY_CARE_PROVIDER_SITE_OTHER): Payer: Self-pay | Admitting: Vascular Surgery

## 2014-09-16 ENCOUNTER — Encounter: Payer: Self-pay | Admitting: Vascular Surgery

## 2014-09-16 VITALS — BP 109/83 | HR 71 | Ht 72.0 in | Wt 210.0 lb

## 2014-09-16 DIAGNOSIS — I739 Peripheral vascular disease, unspecified: Secondary | ICD-10-CM

## 2014-09-16 DIAGNOSIS — Z48812 Encounter for surgical aftercare following surgery on the circulatory system: Secondary | ICD-10-CM | POA: Insufficient documentation

## 2014-09-16 DIAGNOSIS — F17210 Nicotine dependence, cigarettes, uncomplicated: Secondary | ICD-10-CM | POA: Insufficient documentation

## 2014-09-16 NOTE — Progress Notes (Addendum)
HISTORY AND PHYSICAL     CC:  Right foot pain Referring Provider:  No ref. provider found  HPI: This is a 53 y.o. male who is s/p right femoral to popliteal bypass grafting with vein in August 2014.  He has undergone right 4th and 5th toe amputations in March 2015 and these have healed.     He states that he has pain in his right foot all the time.  He does take 1/2 of a percocet at night occasionally to help with the pain and sleep.  He is down to smoking 2-3 cigarettes/day.  He states that he has not had any non healing wounds on his feet.  He does get pain in his right calf when he walks.    Past Medical History  Diagnosis Date  . Prolapsed, anus     hx of  . Peripheral vascular disease   . PAD (peripheral artery disease)   . History of bronchitis     last time about 2-2959yrs ago  . Headache(784.0)     occasionally and sinus related   . Arthritis     low back and left hip  . Chronic back pain     Past Surgical History  Procedure Laterality Date  . Back surgery      L1-2  . Back surgery      L1-2  . Back surgery      thinks L1- 2  . Colon surgery      removal of colon due to proplase rectum  . Colostomy    . Colostomy closure  2006ish  . Femoral-tibial bypass graft Right 06/28/2013    Procedure: BYPASS GRAFT FEMORAL TO TIBIAL PERONEAL TRUNK ARTERY;  Surgeon: Larina Earthlyodd F Early, MD;  [facility];  Service: Vascular;  Laterality: Right;  . Amputation Right 12/04/2013    Procedure: AMPUTATION DIGIT- 3RD TOE RIGHT FOOT ;  Surgeon: Larina Earthlyodd F Early, MD;  [facility];  Service: Vascular;  Laterality: Right;  . Esophagogastroduodenoscopy    . Amputation Right 01/15/2014    Procedure: AMPUTATION OF RIGHT 4TH and 5TH TOES;  Surgeon: Larina Earthlyodd F Early, MD;  [facility];  Service: Vascular;  Laterality: Right;    Allergies  Allergen Reactions  . Penicillins Other (See Comments)    Childhood allergy not sure what the reaction is       Current Outpatient Prescriptions    Medication Sig Dispense Refill  . diphenhydrAMINE (BENADRYL) 25 mg capsule Take 25 mg by mouth every 6 (six) hours as needed for allergies.     Marland Kitchen. oxyCODONE-acetaminophen (PERCOCET/ROXICET) 5-325 MG per tablet Take 1-2 tablets every 6 hrs./ prn/ pain 30 tablet 0   No current facility-administered medications for this visit.    Family History  Problem Relation Age of Onset  . Cancer Mother   . Diabetes Mother   . Diabetes Father   . Hyperlipidemia Father   . Hypertension Father   . Heart attack Father     History   Social History  . Marital Status: Divorced    Spouse Name: N/A    Number of Children: N/A  . Years of Education: N/A   Occupational History  . Not on file.   Social History Main Topics  . Smoking status: Current Every Day Smoker -- 0.50 packs/day for 36 years    Types: Cigarettes  . Smokeless tobacco: Not on file  . Alcohol Use: No  . Drug Use: No  . Sexual Activity: Not on file  Other Topics Concern  . Not on file   Social History Narrative     ROS: [x]  Positive   [ ]  Negative   [ ]  All sytems reviewed and are negative  Cardiovascular: []  chest pain/pressure []  palpitations []  SOB lying flat []  DOE [x]  pain in legs while walking [x]  pain in feet when lying flat []  hx of DVT []  hx of phlebitis []  swelling in legs []  varicose veins  Pulmonary: []  productive cough []  asthma []  wheezing  Neurologic: []  weakness in []  arms []  legs []  numbness in []  arms []  legs [] difficulty speaking or slurred speech []  temporary loss of vision in one eye []  dizziness  Hematologic: []  bleeding problems []  problems with blood clotting easily  GI []  vomiting blood []  blood in stool  GU: []  burning with urination []  blood in urine  Psychiatric: []  hx of major depression  Integumentary: []  rashes []  ulcers  Constitutional: []  fever []  chills   PHYSICAL EXAMINATION:  Filed Vitals:   09/16/14 1339  BP: 109/83  Pulse: 71   Body mass  index is 28.47 kg/(m^2).  General:  WDWN in NAD Gait: Not observed HENT: WNL, normocephalic Pulmonary: normal non-labored breathing , without Rales, rhonchi,  wheezing Cardiac: RRR, without  Murmurs, rubs or gallops;  Skin: without rashes, without ulcers  Vascular Exam/Pulses: 2+ palpable left DP The right popliteal and pedal pulses are not palpable His right foot is cyanotic from the toes to mid foot Bilateral feet are cool Extremities: without ischemic changes, without Gangrene , without cellulitis; without open wounds;  Musculoskeletal: no muscle wasting or atrophy  Neurologic: A&O X 3; Appropriate Affect ; SENSATION: normal; MOTOR FUNCTION:  moving all extremities equally. Speech is fluent/normal   Non-Invasive Vascular Imaging:   ABI's 09/16/14: Right:  0.43 Left:  1.02  Previous ABI's  Right:  0.55 Left:  1.12  Pt meds includes: Statin:  No. Beta Blocker:  No. Aspirin:  No. ACEI:  No. ARB:  No. Other Antiplatelet/Anticoagulant:  No.    ASSESSMENT/PLAN:: 53 y.o. male who is s/p right femoral to popliteal bypass grafting with vein August 2014 and 4th and 5th toe amputations March 2015 presents today for follow up ABI's.   -pt's ABI's on the right are slightly worse today than back in July.  At this time, he does not have limb threatening ischemia.   -pt continues to have pain, but he does not have any non healing wounds of his right foot.   -Dr. Arbie CookeyEarly discussed with him the options of proceeding with arteriogram vs returning in 6 months and continue to observe if he is able to tolerate.  Pt prefers to wait 6 months and return for repeat ABI's.  -pt has cut down to to 2-3 cigarettes/day.  He is congratulated on this, but encouraged to continue to quit completely.   -pt will contact us sooner if his symptoms worsen or if he gets a non healing wound on his right foot. -he is give a pain Rx today for Percocet 5/325 #40 NR.   Doreatha MassedSamantha Catlynn Grondahl, PA-C [facility] [phone]  Clinic MD:  Pt seen and examined in conjunction with Dr. Arbie CookeyEarly  I have examined the patient, reviewed and agree with above.  EARLY, [name], MD 09/16/2014 2:30 PM

## 2014-10-23 ENCOUNTER — Encounter (HOSPITAL_COMMUNITY): Payer: Self-pay | Admitting: Vascular Surgery

## 2014-11-07 IMAGING — CR DG FOOT COMPLETE 3+V*R*
3 series · 3 of 3 positions shown · non-contrast
Comparison: None.

CLINICAL DATA: Foot pain.  Right ankle wound.

RIGHT FOOT COMPLETE - 3+ VIEW

[t foot ap right *]
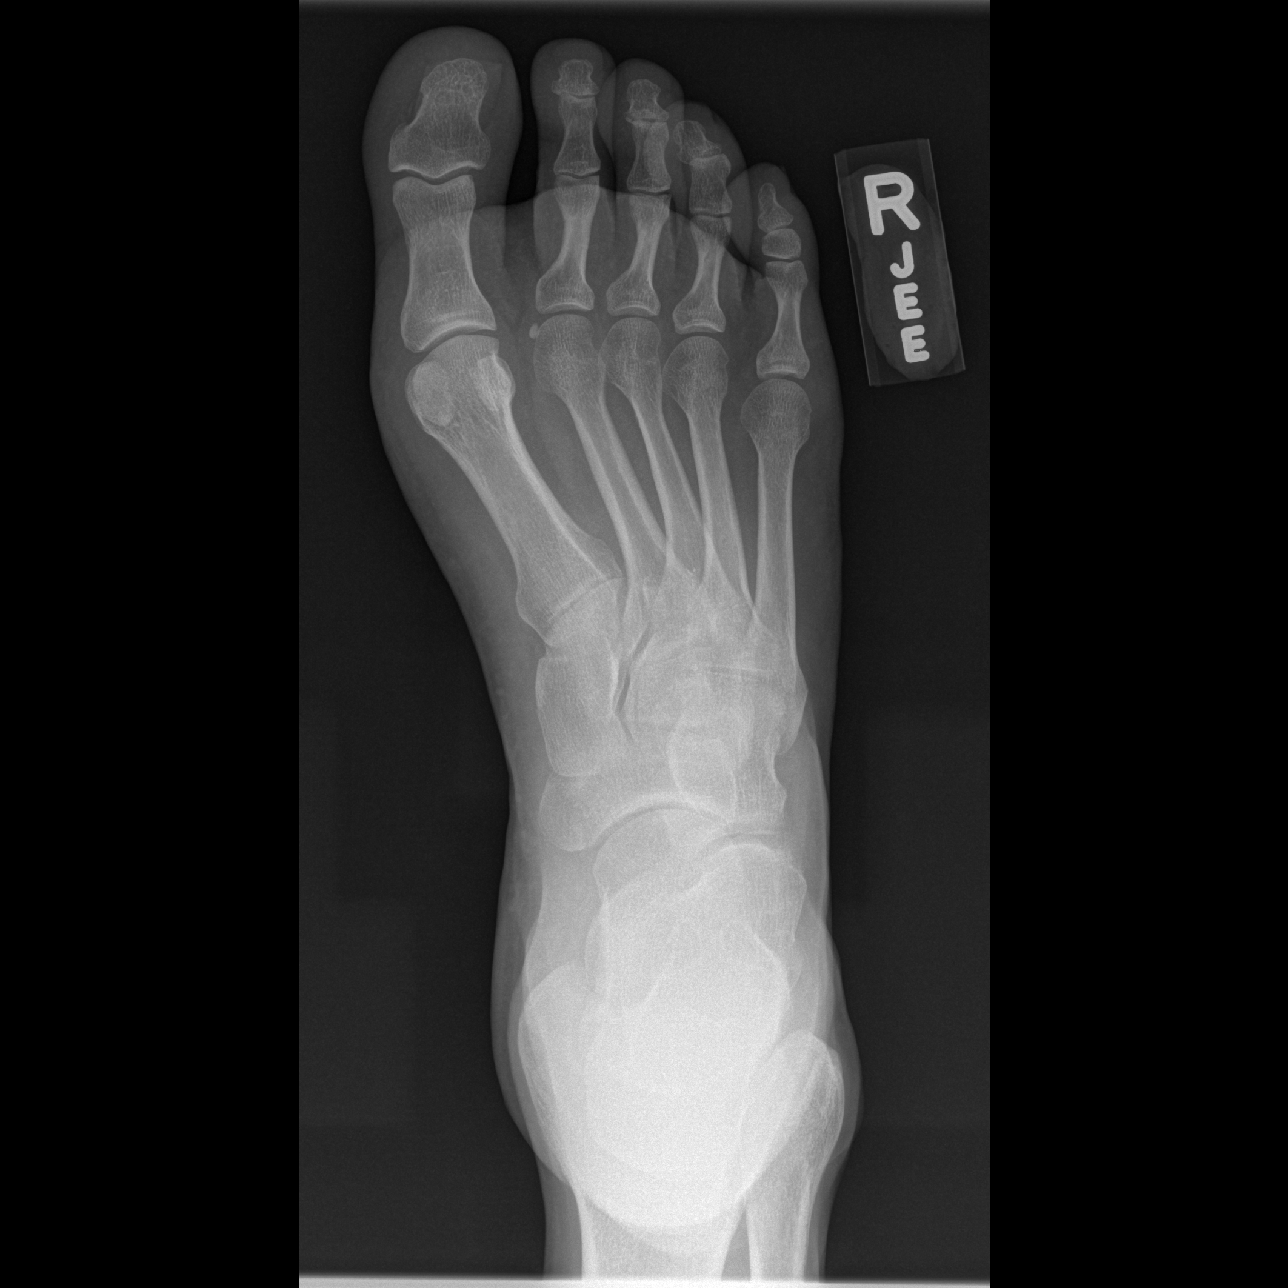

[t foot oblique right]
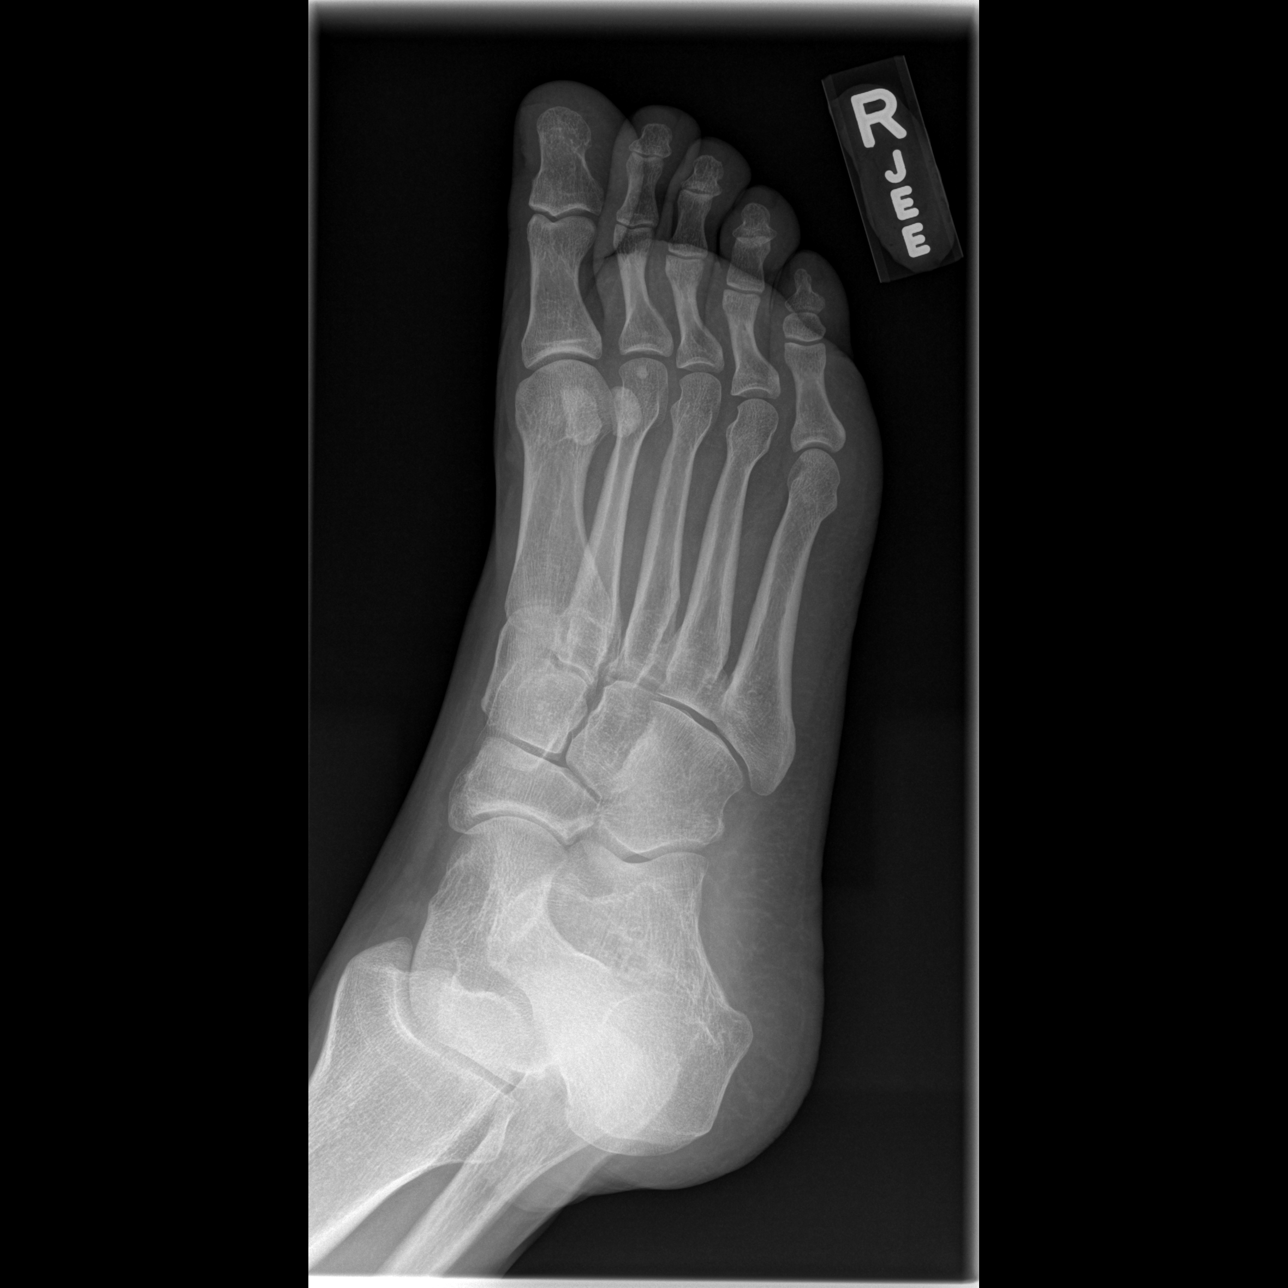

[t foot lat right]
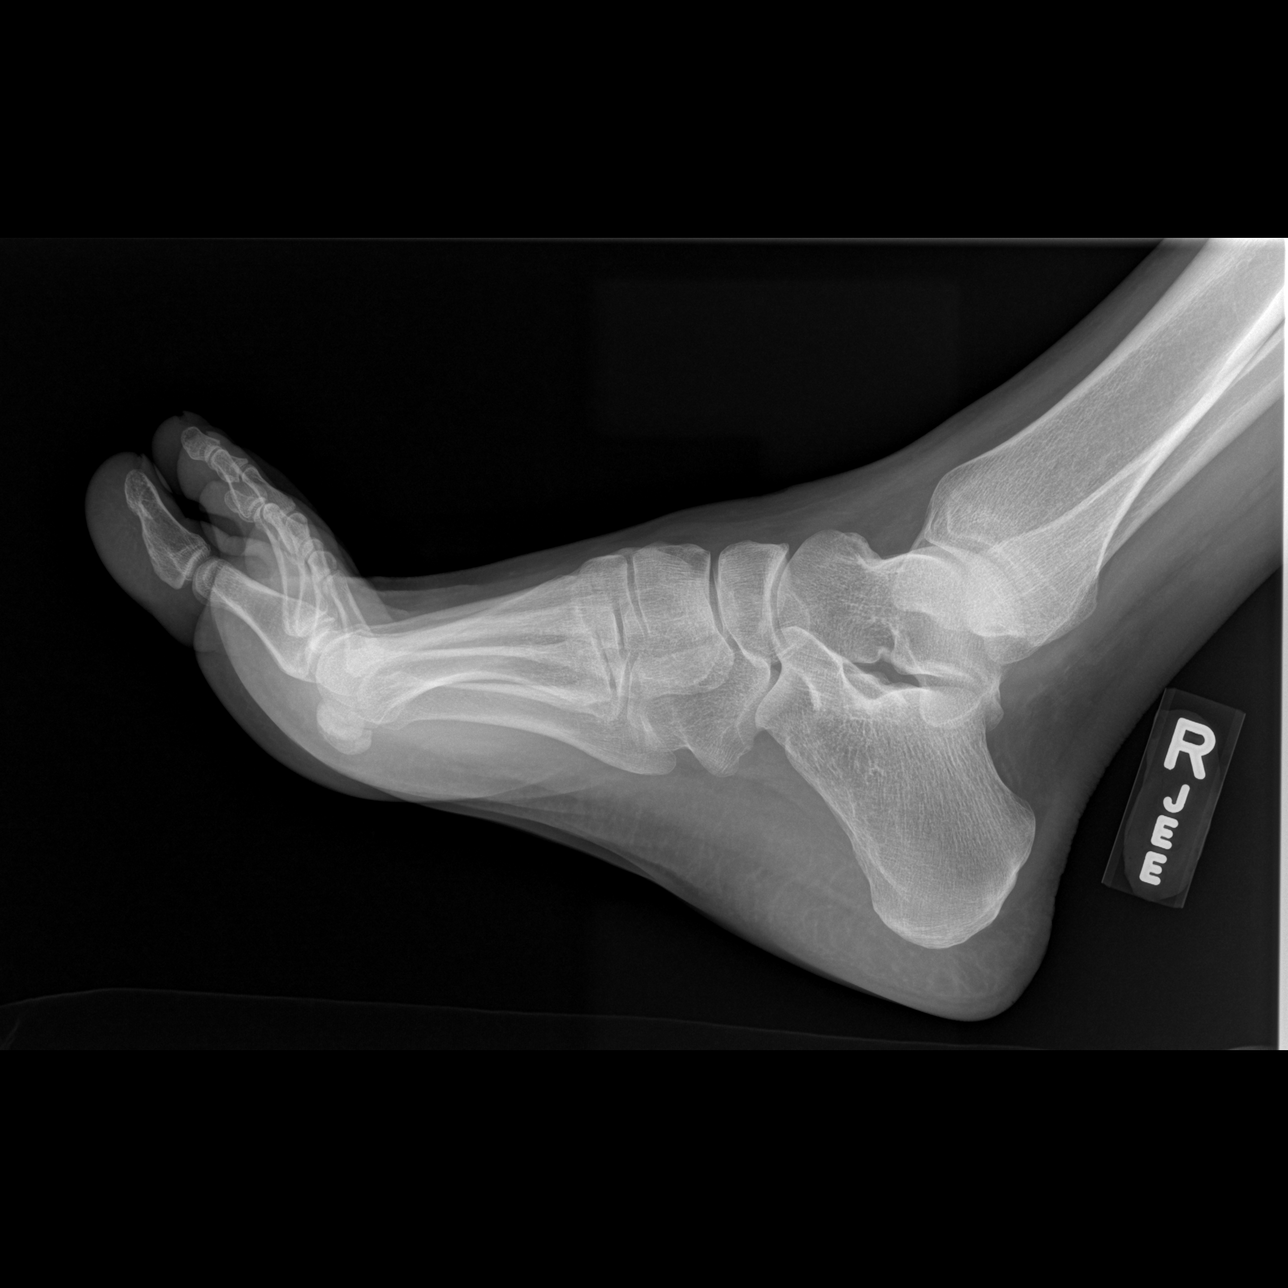

[3 of 3 positions shown; findings below may reference images not displayed]

FINDINGS: No acute fracture, dislocation or bony destruction is
identified.  Soft tissues are unremarkable.  No foreign body is
seen.  No significant arthropathy.
IMPRESSION: Normal right foot radiographs.

## 2015-03-17 ENCOUNTER — Encounter (HOSPITAL_COMMUNITY): Payer: Self-pay

## 2015-03-17 ENCOUNTER — Ambulatory Visit: Payer: Self-pay | Admitting: Vascular Surgery

## 2015-03-17 ENCOUNTER — Other Ambulatory Visit (HOSPITAL_COMMUNITY): Payer: Self-pay

## 2015-03-25 ENCOUNTER — Encounter: Payer: Self-pay | Admitting: Cardiology

## 2015-04-03 ENCOUNTER — Encounter: Payer: Self-pay | Admitting: Vascular Surgery

## 2015-04-07 ENCOUNTER — Ambulatory Visit (INDEPENDENT_AMBULATORY_CARE_PROVIDER_SITE_OTHER)
Admission: RE | Admit: 2015-04-07 | Discharge: 2015-04-07 | Disposition: A | Discharge status: Home / Self Care | Payer: Self-pay | Source: Ambulatory Visit | Attending: Vascular Surgery | Admitting: Vascular Surgery

## 2015-04-07 ENCOUNTER — Ambulatory Visit (HOSPITAL_COMMUNITY)
Admission: RE | Admit: 2015-04-07 | Discharge: 2015-04-07 | Disposition: A | Discharge status: Home / Self Care | Payer: Self-pay | Source: Ambulatory Visit | Attending: Vascular Surgery | Admitting: Vascular Surgery

## 2015-04-07 ENCOUNTER — Ambulatory Visit (INDEPENDENT_AMBULATORY_CARE_PROVIDER_SITE_OTHER): Payer: Self-pay | Admitting: Vascular Surgery

## 2015-04-07 ENCOUNTER — Encounter: Payer: Self-pay | Admitting: Vascular Surgery

## 2015-04-07 VITALS — BP 112/82 | HR 63 | Resp 16 | Ht 72.0 in | Wt 208.0 lb

## 2015-04-07 DIAGNOSIS — M79609 Pain in unspecified limb: Secondary | ICD-10-CM | POA: Insufficient documentation

## 2015-04-07 DIAGNOSIS — I999 Unspecified disorder of circulatory system: Principal | ICD-10-CM

## 2015-04-07 DIAGNOSIS — I739 Peripheral vascular disease, unspecified: Secondary | ICD-10-CM

## 2015-04-07 DIAGNOSIS — Z48812 Encounter for surgical aftercare following surgery on the circulatory system: Secondary | ICD-10-CM

## 2015-04-07 DIAGNOSIS — I998 Other disorder of circulatory system: Secondary | ICD-10-CM

## 2015-04-07 DIAGNOSIS — Z95828 Presence of other vascular implants and grafts: Secondary | ICD-10-CM | POA: Insufficient documentation

## 2015-04-07 DIAGNOSIS — Z89421 Acquired absence of other right toe(s): Secondary | ICD-10-CM | POA: Insufficient documentation

## 2015-04-07 DIAGNOSIS — F17200 Nicotine dependence, unspecified, uncomplicated: Secondary | ICD-10-CM | POA: Insufficient documentation

## 2015-04-07 DIAGNOSIS — M79671 Pain in right foot: Secondary | ICD-10-CM

## 2015-04-07 NOTE — Progress Notes (Signed)
The patient is today for continued follow-up of right leg ischemia. He initially presented in August 2014 with profound ischemia of his foot and tissue loss. Arteriogram revealed eccentric plaque in his common femoral artery and occlusion of his superficial femoral and popliteal arteries with reconstitution of the tibioperoneal trunk. He underwent endarterectomy of his common femoral and right femoral to tibioperoneal trunk bypass in August 2014. He continues to have the nonhealing of his toes and underwent eventually third toe amputation in January 2015 and fourth and fifth amputation in March 2015. He has healed his amputation sites. He continues to have constant pain in his right foot. Fortunately no other tissue loss. He has done well with tobacco use down to 1-2 cigarettes per day.  Past Medical History  Diagnosis Date  . Prolapsed, anus     hx of  . Peripheral vascular disease   . PAD (peripheral artery disease)   . History of bronchitis     last time about 2-41yrs ago  . Headache(784.0)     occasionally and sinus related   . Arthritis     low back and left hip  . Chronic back pain     History  Substance Use Topics  . Smoking status: Current Some Day Smoker -- 0.50 packs/day for 36 years    Types: Cigarettes  . Smokeless tobacco: Never Used  . Alcohol Use: No    Family History  Problem Relation Age of Onset  . Cancer Mother   . Diabetes Mother   . Diabetes Father   . Hyperlipidemia Father   . Hypertension Father   . Heart attack Father     Allergies  Allergen Reactions  . Penicillins Other (See Comments)    Childhood allergy not sure what the reaction is        Current outpatient prescriptions:  .  diphenhydrAMINE (BENADRYL) 25 mg capsule, Take 25 mg by mouth every 6 (six) hours as needed for allergies. , Disp: , Rfl:  .  oxyCODONE-acetaminophen (PERCOCET/ROXICET) 5-325 MG per tablet, Take 1-2 tablets every 6 hrs./ prn/ pain (Patient not taking: Reported on  04/07/2015), Disp: 30 tablet, Rfl: 0  Filed Vitals:   04/07/15 1402  BP: 112/82  Pulse: 63  Resp: 16  Height: 6' (1.829 m)  Weight: 208 lb (94.348 kg)    Body mass index is 28.2 kg/(m^2).       On physical exam: Well-developed well-nourished gentleman in no acute distress. 2+ femoral pulses bilaterally. He has a 2+ dorsalis pedis pulse on the left and absent popliteal and distal pulses on the right. He does have cyanosis of his right foot and none on his left foot. The foot is cool with no tissue loss  Noninvasive studies today reveal occlusion of his right femoral to tibial bypass. 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 He does have chronic constant rest pain. I did explain that in all likelihood this could be improved with revascularization. Explained that he currently does not have any emergent need for this since he has been able to tolerate his arterial insufficiency. He does have a claudication and rest pain. Fortunately no tissue loss. Explain options would be repeat arteriogram and repeat revascularization. Would require a bypass with vein from his left leg. He wishes to have inability to consider this further.  Will wishes to proceed with arteriography and potential bypass. Otherwise we'll see him again in 3 months for continued follow-up. He does use Percocet very sparingly and has had no refill since my last visit with him. Was given a prescription for Percocet 03/16/2024 #40 with no refills.

## 2015-05-28 NOTE — Telephone Encounter (Signed)
Error encounter. 

## 2015-07-06 ENCOUNTER — Encounter: Payer: Self-pay | Admitting: Vascular Surgery

## 2015-07-07 ENCOUNTER — Ambulatory Visit: Payer: Self-pay | Admitting: Vascular Surgery

## 2020-01-13 DIAGNOSIS — I639 Cerebral infarction, unspecified: Secondary | ICD-10-CM

## 2020-01-13 HISTORY — DX: Cerebral infarction, unspecified: I63.9

## 2020-01-27 ENCOUNTER — Inpatient Hospital Stay (HOSPITAL_COMMUNITY)
Admission: EM | Admit: 2020-01-27 | Discharge: 2020-01-30 | DRG: 065 | Disposition: A | Discharge status: Rehabilitation Facility | Payer: Self-pay | Attending: Family Medicine | Admitting: Family Medicine

## 2020-01-27 ENCOUNTER — Emergency Department (HOSPITAL_COMMUNITY): Payer: Self-pay

## 2020-01-27 ENCOUNTER — Encounter (HOSPITAL_COMMUNITY): Payer: Self-pay | Admitting: Emergency Medicine

## 2020-01-27 ENCOUNTER — Other Ambulatory Visit: Payer: Self-pay

## 2020-01-27 DIAGNOSIS — I739 Peripheral vascular disease, unspecified: Secondary | ICD-10-CM | POA: Diagnosis present

## 2020-01-27 DIAGNOSIS — G8194 Hemiplegia, unspecified affecting left nondominant side: Secondary | ICD-10-CM | POA: Diagnosis present

## 2020-01-27 DIAGNOSIS — F17210 Nicotine dependence, cigarettes, uncomplicated: Secondary | ICD-10-CM | POA: Diagnosis present

## 2020-01-27 DIAGNOSIS — I63511 Cerebral infarction due to unspecified occlusion or stenosis of right middle cerebral artery: Principal | ICD-10-CM | POA: Diagnosis present

## 2020-01-27 DIAGNOSIS — R001 Bradycardia, unspecified: Secondary | ICD-10-CM | POA: Diagnosis present

## 2020-01-27 DIAGNOSIS — Z720 Tobacco use: Secondary | ICD-10-CM | POA: Diagnosis present

## 2020-01-27 DIAGNOSIS — D751 Secondary polycythemia: Secondary | ICD-10-CM | POA: Diagnosis present

## 2020-01-27 DIAGNOSIS — I639 Cerebral infarction, unspecified: Secondary | ICD-10-CM | POA: Diagnosis present

## 2020-01-27 DIAGNOSIS — E785 Hyperlipidemia, unspecified: Secondary | ICD-10-CM | POA: Diagnosis present

## 2020-01-27 DIAGNOSIS — I63411 Cerebral infarction due to embolism of right middle cerebral artery: Secondary | ICD-10-CM

## 2020-01-27 DIAGNOSIS — I10 Essential (primary) hypertension: Secondary | ICD-10-CM | POA: Diagnosis present

## 2020-01-27 DIAGNOSIS — K219 Gastro-esophageal reflux disease without esophagitis: Secondary | ICD-10-CM | POA: Diagnosis present

## 2020-01-27 DIAGNOSIS — Z9114 Patient's other noncompliance with medication regimen: Secondary | ICD-10-CM

## 2020-01-27 DIAGNOSIS — R29810 Facial weakness: Secondary | ICD-10-CM | POA: Diagnosis present

## 2020-01-27 DIAGNOSIS — R29705 NIHSS score 5: Secondary | ICD-10-CM | POA: Diagnosis present

## 2020-01-27 DIAGNOSIS — Z20822 Contact with and (suspected) exposure to covid-19: Secondary | ICD-10-CM | POA: Diagnosis present

## 2020-01-27 DIAGNOSIS — E538 Deficiency of other specified B group vitamins: Secondary | ICD-10-CM | POA: Diagnosis present

## 2020-01-27 HISTORY — DX: Deficiency of other specified B group vitamins: E53.8

## 2020-01-27 HISTORY — DX: Bradycardia, unspecified: R00.1

## 2020-01-27 LAB — DIFFERENTIAL
Abs Immature Granulocytes: 0.04 10*3/uL (ref 0.00–0.07)
Basophils Absolute: 0.1 10*3/uL (ref 0.0–0.1)
Basophils Relative: 1 %
Eosinophils Absolute: 0 10*3/uL (ref 0.0–0.5)
Eosinophils Relative: 0 %
Immature Granulocytes: 0 %
Lymphocytes Relative: 17 %
Lymphs Abs: 1.9 10*3/uL (ref 0.7–4.0)
Monocytes Absolute: 0.8 10*3/uL (ref 0.1–1.0)
Monocytes Relative: 7 %
Neutro Abs: 8.3 10*3/uL — ABNORMAL HIGH (ref 1.7–7.7)
Neutrophils Relative %: 75 %

## 2020-01-27 LAB — I-STAT CHEM 8, ED
BUN: 11 mg/dL (ref 6–20)
Calcium, Ion: 1.11 mmol/L — ABNORMAL LOW (ref 1.15–1.40)
Chloride: 106 mmol/L (ref 98–111)
Creatinine, Ser: 1 mg/dL (ref 0.61–1.24)
Glucose, Bld: 131 mg/dL — ABNORMAL HIGH (ref 70–99)
HCT: 54 % — ABNORMAL HIGH (ref 39.0–52.0)
Hemoglobin: 18.4 g/dL — ABNORMAL HIGH (ref 13.0–17.0)
Potassium: 3.9 mmol/L (ref 3.5–5.1)
Sodium: 140 mmol/L (ref 135–145)
TCO2: 23 mmol/L (ref 22–32)

## 2020-01-27 LAB — CBC
HCT: 54.7 % — ABNORMAL HIGH (ref 39.0–52.0)
Hemoglobin: 17.3 g/dL — ABNORMAL HIGH (ref 13.0–17.0)
MCH: 29.9 pg (ref 26.0–34.0)
MCHC: 31.6 g/dL (ref 30.0–36.0)
MCV: 94.6 fL (ref 80.0–100.0)
Platelets: 223 10*3/uL (ref 150–400)
RBC: 5.78 MIL/uL (ref 4.22–5.81)
RDW: 12.8 % (ref 11.5–15.5)
WBC: 11.1 10*3/uL — ABNORMAL HIGH (ref 4.0–10.5)
nRBC: 0 % (ref 0.0–0.2)

## 2020-01-27 LAB — COMPREHENSIVE METABOLIC PANEL
ALT: 17 U/L (ref 0–44)
AST: 18 U/L (ref 15–41)
Albumin: 4.1 g/dL (ref 3.5–5.0)
Alkaline Phosphatase: 64 U/L (ref 38–126)
Anion gap: 12 (ref 5–15)
BUN: 9 mg/dL (ref 6–20)
CO2: 22 mmol/L (ref 22–32)
Calcium: 9.4 mg/dL (ref 8.9–10.3)
Chloride: 106 mmol/L (ref 98–111)
Creatinine, Ser: 1.03 mg/dL (ref 0.61–1.24)
GFR calc Af Amer: >60 mL/min (ref >60)
GFR calc non Af Amer: >60 mL/min (ref >60)
Glucose, Bld: 134 mg/dL — ABNORMAL HIGH (ref 70–99)
Potassium: 4.3 mmol/L (ref 3.5–5.1)
Sodium: 140 mmol/L (ref 135–145)
Total Bilirubin: 0.5 mg/dL (ref 0.3–1.2)
Total Protein: 7.7 g/dL (ref 6.5–8.1)

## 2020-01-27 LAB — CBG MONITORING, ED: Glucose-Capillary: 128 mg/dL — ABNORMAL HIGH (ref 70–99)

## 2020-01-27 LAB — PROTIME-INR
INR: 1 (ref 0.8–1.2)
Prothrombin Time: 12.5 seconds (ref 11.4–15.2)

## 2020-01-27 LAB — APTT: aPTT: 27 seconds (ref 24–36)

## 2020-01-27 MED ORDER — IOHEXOL 350 MG/ML SOLN
75.0000 mL | Freq: Once | INTRAVENOUS | Status: AC | PRN
  Administered 2020-01-27: 75 mL via INTRAVENOUS

## 2020-01-27 MED ORDER — SODIUM CHLORIDE 0.9% FLUSH: 3.0000 mL | Freq: Once | INTRAVENOUS | Status: DC

## 2020-01-27 NOTE — ED Triage Notes (Signed)
Per EMS, pt from home byself.  LKW was last night at 2300.  This morning he woke up w/ C/o left arm weakness.  Reported left sided facial droop, slurred speech and left arm weakness.  Pt was ambulatory on scene.    20G L AC 160/96 114HR 96%RA CBG 154

## 2020-01-27 NOTE — ED Provider Notes (Signed)
[facility] EMERGENCY DEPARTMENT Provider Note   CSN: [id number] Arrival date & time: 01/27/20  2119  An emergency department physician performed an initial assessment on this suspected stroke patient at 2124.  History Chief Complaint  Patient presents with  . Code Stroke    Cody Baird is a 59 y.o. male.  Pt presents to the ED today with left arm weakness.  Pt was LSN at 2300 yesterday.  He woke up this am with these sx and thought they would get better.  He noticed a left facial droop and a some difficulty speaking.  He called EMS and a Code Stroke was called.  Pt has not seen a doctor in several years.        Past Medical History:  Diagnosis Date  . Arthritis    low back and left hip  . Chronic back pain   . Headache(784.0)    occasionally and sinus related   . History of bronchitis    last time about 2-34yrs ago  . PAD (peripheral artery disease) (HCC)   . Peripheral vascular disease (HCC)   . Prolapsed, anus    hx of    Patient Active Problem List   Diagnosis Date Noted  . Aftercare following surgery of the circulatory system, NEC 01/14/2014  . Ischemic pain of foot 12/24/2013  . Atherosclerotic peripheral vascular disease with rest pain (HCC) 10/30/2013  . Post-op pain 09/17/2013  . PVD (peripheral vascular disease) (HCC) 09/17/2013  . Post-operative pain 07/16/2013  . 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 Peripheral vascular disease, unspecified (HCC) 06/25/2013  . Pain in limb 06/25/2013    Past Surgical History:  Procedure Laterality Date  . ABDOMINAL AORTAGRAM N/A 06/26/2013   Procedure: ABDOMINAL AORTAGRAM;  Surgeon: Larina Earthly, MD;  Location: [facility] CATH LAB;  Service: Cardiovascular;  Laterality: N/A;  . AMPUTATION Right 12/04/2013   Procedure: AMPUTATION DIGIT- 3RD TOE RIGHT FOOT ;  Surgeon: Larina Earthly, MD;  Location: [facility] OR;  Service: Vascular;  Laterality: Right;  . AMPUTATION  Right 01/15/2014   Procedure: AMPUTATION OF RIGHT 4TH and 5TH TOES;  Surgeon: Larina Earthly, MD;  Location: [facility] OR;  Service: Vascular;  Laterality: Right;  . BACK SURGERY     L1-2  . BACK SURGERY     L1-2  . BACK SURGERY     thinks L1- 2  . COLON SURGERY     removal of colon due to proplase rectum  . COLOSTOMY    . COLOSTOMY CLOSURE  2006ish  . ESOPHAGOGASTRODUODENOSCOPY    . FEMORAL-TIBIAL BYPASS GRAFT Right 06/28/2013   Procedure: BYPASS GRAFT FEMORAL TO TIBIAL PERONEAL TRUNK ARTERY;  Surgeon: Larina Earthly, MD;  Location: [facility] OR;  Service: Vascular;  Laterality: Right;       Family History  Problem Relation Age of Onset  . Cancer Mother   . Diabetes Mother   . Diabetes Father   . Hyperlipidemia Father   . Hypertension Father   . Heart attack Father     Social History   Tobacco Use  . Smoking status: Current Some Day Smoker    Packs/day: 0.50    Years: 36.00    Pack years: 18.00    Types: Cigarettes  . Smokeless tobacco: Never Used  Substance Use Topics  . Alcohol use: No    Alcohol/week: 0.0 standard drinks  . Drug use: No    Home Medications Prior to Admission medications  Not on File    Allergies    Penicillins  Review of Systems   Review of Systems  Neurological:       Left arm weakness, difficulty speaking  All other systems reviewed and are negative.   Physical Exam Updated Vital Signs BP (!) 147/99   Pulse 80   Temp (!) 97.5 F (36.4 C) (Tympanic)   Resp (!) 22   Ht 6' (1.829 m)   Wt 88.5 kg   SpO2 97%   BMI 26.45 kg/m   Physical Exam Vitals and nursing note reviewed.  Constitutional:      Appearance: Normal appearance.  HENT:     Head: Atraumatic.     Comments: Left facial droop    Right Ear: External ear normal.     Left Ear: External ear normal.     Nose: Nose normal.     Mouth/Throat:     Mouth: Mucous membranes are dry.     Pharynx: Oropharynx is clear.  Eyes:     Extraocular Movements: Extraocular movements intact.      Conjunctiva/sclera: Conjunctivae normal.     Pupils: Pupils are equal, round, and reactive to light.  Cardiovascular:     Rate and Rhythm: Normal rate and regular rhythm.     Pulses: Normal pulses.     Heart sounds: Normal heart sounds.  Pulmonary:     Effort: Pulmonary effort is normal.     Breath sounds: Normal breath sounds.  Abdominal:     General: Abdomen is flat. Bowel sounds are normal.     Palpations: Abdomen is soft.  Musculoskeletal:        General: Normal range of motion.     Cervical back: Normal range of motion and neck supple.     Comments: Right 3,4, 5 toe amputation  Skin:    General: Skin is warm.     Capillary Refill: Capillary refill takes less than 2 seconds.  Neurological:     Mental Status: He is alert and oriented to person, place, and time.     Comments: Left arm weakness  Psychiatric:        Mood and Affect: Mood normal.        Behavior: Behavior normal.     ED Results / Procedures / Treatments   Labs (all labs ordered are listed, but only abnormal results are displayed) Labs Reviewed  CBC - Abnormal; Notable for the following components:      Result Value   WBC 11.1 (*)    Hemoglobin 17.3 (*)    HCT 54.7 (*)    All other components within normal limits  DIFFERENTIAL - Abnormal; Notable for the following components:   Neutro Abs 8.3 (*)    All other components within normal limits  COMPREHENSIVE METABOLIC PANEL - Abnormal; Notable for the following components:   Glucose, Bld 134 (*)    All other components within normal limits  I-STAT CHEM 8, ED - Abnormal; Notable for the following components:   Glucose, Bld 131 (*)    Calcium, Ion 1.11 (*)    Hemoglobin 18.4 (*)    HCT 54.0 (*)    All other components within normal limits  CBG MONITORING, ED - Abnormal; Notable for the following components:   Glucose-Capillary 128 (*)    All other components within normal limits  SARS CORONAVIRUS 2 (TAT 6-24 HRS)  PROTIME-INR  APTT    EKG EKG  Interpretation  Date/Time:  Monday January 27 2020 21:55:17 EDT Ventricular  Rate:  79 PR Interval:    QRS Duration: 97 QT Interval:  373 QTC Calculation: 428 R Axis:   59 Text Interpretation: Sinus rhythm No significant change since last tracing Confirmed by Jacalyn Lefevre [phone]) on 01/27/2020 10:11:19 PM   Radiology CT ANGIO HEAD W OR WO CONTRAST  Result Date: 01/27/2020 CLINICAL DATA:  Stroke, follow-up. Additional history provided: Facial droop, left arm weakness. EXAM: CT ANGIOGRAPHY HEAD AND NECK TECHNIQUE: Multidetector CT imaging of the head and neck was performed using the standard protocol during bolus administration of intravenous contrast. Multiplanar CT image reconstructions and MIPs were obtained to evaluate the vascular anatomy. Carotid stenosis measurements (when applicable) are obtained utilizing NASCET criteria, using the distal internal carotid diameter as the denominator. CONTRAST:  49mL OMNIPAQUE IOHEXOL 350 MG/ML SOLN COMPARISON:  Noncontrast head CT performed earlier the same day 01/27/2020. FINDINGS: CTA NECK FINDINGS Aortic arch: Standard aortic branching. The visualized aortic arch is unremarkable. No significant innominate or proximal subclavian artery stenosis. Right carotid system: CCA and ICA patent within the neck without stenosis. Tortuosity of the cervical ICA. Left carotid system: CCA and ICA patent within the neck without stenosis. Mild mixed plaque within the proximal ICA. Vertebral arteries: Left vertebral artery dominant. The vertebral arteries are patent within the neck bilaterally without significant stenosis. Skeleton: No acute bony abnormality or aggressive osseous lesion. Cervical spondylosis. Most notably there is a C5-C6 disc bulge with associated endplate spurring and osteophyte ridge. Bilateral neural foraminal narrowing at least mild bony spinal canal stenosis at this level. Other neck: No neck mass or cervical lymphadenopathy. Upper chest: Centrilobular  emphysema. Review of the MIP images confirms the above findings CTA HEAD FINDINGS Anterior circulation: The intracranial internal carotid arteries are patent without significant stenosis. There is a moderate to moderately advanced stenosis within the distal M1 right MCA (series 11, image 58) (series 6, image 289). No right M2 proximal branch occlusion is identified. The M1 left middle cerebral artery is patent without significant stenosis. No left M2 proximal branch occlusion is identified. The anterior cerebral arteries are patent without high-grade proximal stenosis. The A1 left ACA is dominant. There is an apparent high-grade stenosis within the A3-A4 left ACA (series 10, image 30). No intracranial aneurysm is identified. Posterior circulation: The intracranial internal carotid arteries are patent. Mixed plaque results in sites of up to moderate stenosis within the intracranial right vertebral artery. No more than mild atherosclerotic narrowing of the intracranial left vertebral artery. The basilar artery is patent with mild atherosclerotic irregularity. The posterior cerebral arteries are patent bilaterally without significant proximal stenosis. A left posterior communicating artery is present. A right posterior communicating artery is not definitively identified. Venous sinuses: Within limitations of contrast timing, no convincing thrombus. Anatomic variants: As described. Review of the MIP images confirms the above findings These results were called by telephone at the time of interpretation on 01/27/2020 at 10:20 pm to provider Arther Dames , who verbally acknowledged these results. IMPRESSION: CTA neck: 1. The bilateral common carotid, internal carotid and vertebral arteries are patent within the neck without significant stenosis. Mild mixed plaque within the proximal left ICA. 2. Pulmonary emphysema. CTA head: 1. No intracranial large vessel occlusion. 2. Atherosclerotic disease with multifocal stenoses,  most notably as follows. 3. Moderate to moderately advanced stenosis within the distal M1 right MCA. 4. Apparent high-grade focal stenosis within an A3-A4 left ACA branch vessel. 5. Sites of up to moderate atherosclerotic narrowing within the intracranial right vertebral artery. Electronically Signed  By: Kellie Simmering DO   On: 01/27/2020 22:20   CT ANGIO NECK W OR WO CONTRAST  Result Date: 01/27/2020 CLINICAL DATA:  Stroke, follow-up. Additional history provided: Facial droop, left arm weakness. EXAM: CT ANGIOGRAPHY HEAD AND NECK TECHNIQUE: Multidetector CT imaging of the head and neck was performed using the standard protocol during bolus administration of intravenous contrast. Multiplanar CT image reconstructions and MIPs were obtained to evaluate the vascular anatomy. Carotid stenosis measurements (when applicable) are obtained utilizing NASCET criteria, using the distal internal carotid diameter as the denominator. CONTRAST:  23mL OMNIPAQUE IOHEXOL 350 MG/ML SOLN COMPARISON:  Noncontrast head CT performed earlier the same day 01/27/2020. FINDINGS: CTA NECK FINDINGS Aortic arch: Standard aortic branching. The visualized aortic arch is unremarkable. No significant innominate or proximal subclavian artery stenosis. Right carotid system: CCA and ICA patent within the neck without stenosis. Tortuosity of the cervical ICA. Left carotid system: CCA and ICA patent within the neck without stenosis. Mild mixed plaque within the proximal ICA. Vertebral arteries: Left vertebral artery dominant. The vertebral arteries are patent within the neck bilaterally without significant stenosis. Skeleton: No acute bony abnormality or aggressive osseous lesion. Cervical spondylosis. Most notably there is a C5-C6 disc bulge with associated endplate spurring and osteophyte ridge. Bilateral neural foraminal narrowing at least mild bony spinal canal stenosis at this level. Other neck: No neck mass or cervical lymphadenopathy. Upper  chest: Centrilobular emphysema. Review of the MIP images confirms the above findings CTA HEAD FINDINGS Anterior circulation: The intracranial internal carotid arteries are patent without significant stenosis. There is a moderate to moderately advanced stenosis within the distal M1 right MCA (series 11, image 58) (series 6, image 289). No right M2 proximal branch occlusion is identified. The M1 left middle cerebral artery is patent without significant stenosis. No left M2 proximal branch occlusion is identified. The anterior cerebral arteries are patent without high-grade proximal stenosis. The A1 left ACA is dominant. There is an apparent high-grade stenosis within the A3-A4 left ACA (series 10, image 30). No intracranial aneurysm is identified. Posterior circulation: The intracranial internal carotid arteries are patent. Mixed plaque results in sites of up to moderate stenosis within the intracranial right vertebral artery. No more than mild atherosclerotic narrowing of the intracranial left vertebral artery. The basilar artery is patent with mild atherosclerotic irregularity. The posterior cerebral arteries are patent bilaterally without significant proximal stenosis. A left posterior communicating artery is present. A right posterior communicating artery is not definitively identified. Venous sinuses: Within limitations of contrast timing, no convincing thrombus. Anatomic variants: As described. Review of the MIP images confirms the above findings These results were called by telephone at the time of interpretation on 01/27/2020 at 10:20 pm to provider Samara Snide , who verbally acknowledged these results. IMPRESSION: CTA neck: 1. The bilateral common carotid, internal carotid and vertebral arteries are patent within the neck without significant stenosis. Mild mixed plaque within the proximal left ICA. 2. Pulmonary emphysema. CTA head: 1. No intracranial large vessel occlusion. 2. Atherosclerotic disease with  multifocal stenoses, most notably as follows. 3. Moderate to moderately advanced stenosis within the distal M1 right MCA. 4. Apparent high-grade focal stenosis within an A3-A4 left ACA branch vessel. 5. Sites of up to moderate atherosclerotic narrowing within the intracranial right vertebral artery. Electronically Signed   By: Kellie Simmering DO   On: 01/27/2020 22:20   CT HEAD CODE STROKE WO CONTRAST  Result Date: 01/27/2020 CLINICAL DATA:  Code stroke. Facial droop; left arm weakness,  last known well 2300 yesterday. EXAM: CT HEAD WITHOUT CONTRAST TECHNIQUE: Contiguous axial images were obtained from the base of the skull through the vertex without intravenous contrast. COMPARISON:  No pertinent prior studies available for comparison. FINDINGS: Brain: There is no evidence of acute intracranial hemorrhage, intracranial mass, midline shift or extra-axial fluid collection.No demarcated cortical infarction. Cerebral volume is normal for age. Vascular: No hyperdense vessel. Skull: Normal. Negative for fracture or focal lesion. Sinuses/Orbits: Visualized orbits demonstrate no acute abnormality. Mild ethmoid sinus mucosal thickening. No significant mastoid effusion. These results were called by telephone at the time of interpretation on 01/27/2020 at 9:36 pm to provider Dr. Laurence SlateAroor, who verbally acknowledged these results. IMPRESSION: No CT evidence of acute intracranial abnormality. Electronically Signed   By: Jackey LogeKyle  Golden DO   On: 01/27/2020 21:37    Procedures Procedures (including critical care time)  Medications Ordered in ED Medications  sodium chloride flush (NS) 0.9 % injection 3 mL (has no administration in time range)  iohexol (OMNIPAQUE) 350 MG/ML injection 75 mL (75 mLs Intravenous Contrast Given 01/27/20 2150)    ED Course  I have reviewed the triage vital signs and the nursing notes.  Pertinent labs & imaging results that were available during my care of the patient were reviewed by me and  considered in my medical decision making (see chart for details).    MDM Rules/Calculators/A&P                      Pt is out of the window for tpa.  Pt does not have a LVO.  Dr. Laurence SlateAroor recommends admission for stroke work up.  CRITICAL CARE Performed by: Jacalyn LefevreJulie Khyrin Trevathan   Total critical care time: 30 minutes  Critical care time was exclusive of separately billable procedures and treating other patients.  Critical care was necessary to treat or prevent imminent or life-threatening deterioration.  Critical care was time spent personally by me on the following activities: development of treatment plan with patient and/or surrogate as well as nursing, discussions with consultants, evaluation of patient's response to treatment, examination of patient, obtaining history from patient or surrogate, ordering and performing treatments and interventions, ordering and review of laboratory studies, ordering and review of radiographic studies, pulse oximetry and re-evaluation of patient's condition.  Final Clinical Impression(s) / ED Diagnoses Final diagnoses:  Cerebrovascular accident (CVA), unspecified mechanism (HCC)  Tobacco abuse    Rx / DC Orders ED Discharge Orders    None       Jacalyn LefevreHaviland, Leisha Trinkle, MD 01/27/20 2356

## 2020-01-27 NOTE — ED Notes (Signed)
Admitting provider at bedside.

## 2020-01-27 NOTE — Consult Note (Signed)
Requesting Physician: Dr. Gilford Raid    Chief Complaint: Left-sided weakness, slurred speech  History obtained from: Patient and Chart   HPI:                                                                                                                                       Cody Baird is a 59 y.o. male with peripheral artery disease who does not seek medical care presents to the emergency department for approximately 1 day history of left-sided weakness. Last known normal was around 9 PM last night when he went to bed, woke up with left-sided weakness became worse 11 AM and decided to call EMS.  On arrival to [facility] ER, patient noted to have facial droop, slurred speech and left arm weakness. CT head was performed which did not show acute findings and CT angiogram was negative for large vessel occlusion.  Patient outside the window for IV TPA.  Date last known well: 3-14 21 Time last known well: 11 PM tPA Given: No, outside TPA window NIHSS: 5 Baseline MRS 0    Past Medical History:  Diagnosis Date  . Arthritis    low back and left hip  . Chronic back pain   . Headache(784.0)    occasionally and sinus related   . History of bronchitis    last time about 2-69yrs ago  . PAD (peripheral artery disease)   . Peripheral vascular disease   . Prolapsed, anus    hx of    Past Surgical History:  Procedure Laterality Date  . ABDOMINAL AORTAGRAM N/A 06/26/2013   Procedure: ABDOMINAL AORTAGRAM;  Surgeon: Rosetta Posner, MD;  Location: [facility] CATH LAB;  Service: Cardiovascular;  Laterality: N/A;  . AMPUTATION Right 12/04/2013   Procedure: AMPUTATION DIGIT- 3RD TOE RIGHT FOOT ;  Surgeon: Rosetta Posner, MD;  Location: [place];  Service: Vascular;  Laterality: Right;  . AMPUTATION Right 01/15/2014   Procedure: AMPUTATION OF RIGHT 4TH and 5TH TOES;  Surgeon: Rosetta Posner, MD;  Location: [place];  Service: Vascular;  Laterality: Right;  . BACK SURGERY     L1-2  . BACK SURGERY     L1-2  . BACK  SURGERY     thinks L1- 2  . COLON SURGERY     removal of colon due to proplase rectum  . COLOSTOMY    . COLOSTOMY CLOSURE  2006ish  . ESOPHAGOGASTRODUODENOSCOPY    . FEMORAL-TIBIAL BYPASS GRAFT Right 06/28/2013   Procedure: BYPASS GRAFT FEMORAL TO TIBIAL PERONEAL TRUNK ARTERY;  Surgeon: Rosetta Posner, MD;  Location: [facility] OR;  Service: Vascular;  Laterality: Right;    Family History  Problem Relation Age of Onset  . Cancer Mother   . Diabetes Mother   . Diabetes Father   . Hyperlipidemia Father   . Hypertension Father   . Heart attack Father    Social History:  reports that he has been smoking cigarettes. He has a 18.00 pack-year smoking history. He has never used smokeless tobacco. He reports that he does not drink alcohol or use drugs.  Allergies:  Allergies  Allergen Reactions  . Penicillins Other (See Comments)    Childhood allergy not sure what the reaction is       Medications:                                                                                                                        I reviewed home medications   ROS:                                                                                                                                     14 systems reviewed and negative except above    Examination:                                                                                                      General: Appears well-developed * Psych: Affect appropriate to situation Eyes: No scleral injection HENT: No OP obstrucion Head: Normocephalic.  Cardiovascular: Normal rate and regular rhythm.  Respiratory: Effort normal and breath sounds normal to anterior ascultation GI: Soft.  No distension. There is no tenderness.  Skin: WDI    Neurological Examination Mental Status: Alert, oriented, thought content appropriate.  Speech fluent without evidence of aphasia.  Mild dysarthria.  Able to follow 3 step commands without difficulty. Cranial  Nerves: II: Visual fields grossly normal,  III,IV, VI: ptosis not present, extra-ocular motions intact bilaterally, pupils equal, round, reactive to light and accommodation V,VII: Mild left facial droop, facial light touch sensation normal bilaterally VIII: hearing normal bilaterally IX,X: uvula rises symmetrically XI: bilateral shoulder shrug XII: midline tongue extension Motor: Right : Upper extremity   5/5    Left:     Upper extremity   3/5  Lower extremity   5/5     Lower extremity  5/5 Tone and bulk:normal tone throughout; no atrophy noted Sensory: Pinprick and light touch intact throughout, bilaterally Deep Tendon Reflexes: 2+ and symmetric throughout Plantars: Right: downgoing   Left: downgoing Cerebellar: No gross ataxia noted      Lab Results: Basic Metabolic Panel: Recent Labs  Lab 01/27/20 2133  NA 140  K 3.9  CL 106  GLUCOSE 131*  BUN 11  CREATININE 1.00    CBC: Recent Labs  Lab 01/27/20 2133  HGB 18.4*  HCT 54.0*    Coagulation Studies: No results for input(s): LABPROT, INR in the last 72 hours.  Imaging: CT HEAD CODE STROKE WO CONTRAST  Result Date: 01/27/2020 CLINICAL DATA:  Code stroke. Facial droop; left arm weakness, last known well 2300 yesterday. EXAM: CT HEAD WITHOUT CONTRAST TECHNIQUE: Contiguous axial images were obtained from the base of the skull through the vertex without intravenous contrast. COMPARISON:  No pertinent prior studies available for comparison. FINDINGS: Brain: There is no evidence of acute intracranial hemorrhage, intracranial mass, midline shift or extra-axial fluid collection.No demarcated cortical infarction. Cerebral volume is normal for age. Vascular: No hyperdense vessel. Skull: Normal. Negative for fracture or focal lesion. Sinuses/Orbits: Visualized orbits demonstrate no acute abnormality. Mild ethmoid sinus mucosal thickening. No significant mastoid effusion. These results were called by telephone at the time of  interpretation on 01/27/2020 at 9:36 pm to provider Dr. Laurence Slate, who verbally acknowledged these results. IMPRESSION: No CT evidence of acute intracranial abnormality. Electronically Signed   By: Jackey Loge DO   On: 01/27/2020 21:37     ASSESSMENT AND PLAN   Acute ischemic stroke  Recommendations # MRI of the brain without contrast #Transthoracic Echo  # Start patient on aspirin 81 mg and Plavix 75 mg daily #Start or continue Atorvastatin 80 mg/other high intensity statin # BP goal: permissive HTN upto 220/120 mmHg  # HBAIC and Lipid profile # Telemetry monitoring # Frequent neuro checks # stroke swallow screen  Please page stroke NP  Or  PA  Or MD from 8am -4 pm  as this patient from this time will be  followed by the stroke.   You can look them up on [email or web address]  Password [facility]   Keilana Morlock [facility] Pager Number [phone]

## 2020-01-28 ENCOUNTER — Inpatient Hospital Stay (HOSPITAL_COMMUNITY): Payer: Self-pay

## 2020-01-28 ENCOUNTER — Observation Stay (HOSPITAL_COMMUNITY): Payer: Self-pay

## 2020-01-28 ENCOUNTER — Encounter (HOSPITAL_COMMUNITY): Payer: Self-pay | Admitting: Internal Medicine

## 2020-01-28 DIAGNOSIS — G459 Transient cerebral ischemic attack, unspecified: Secondary | ICD-10-CM

## 2020-01-28 DIAGNOSIS — Z720 Tobacco use: Secondary | ICD-10-CM | POA: Diagnosis present

## 2020-01-28 DIAGNOSIS — I10 Essential (primary) hypertension: Secondary | ICD-10-CM

## 2020-01-28 DIAGNOSIS — I63411 Cerebral infarction due to embolism of right middle cerebral artery: Secondary | ICD-10-CM

## 2020-01-28 DIAGNOSIS — I639 Cerebral infarction, unspecified: Secondary | ICD-10-CM

## 2020-01-28 HISTORY — DX: Essential (primary) hypertension: I10

## 2020-01-28 HISTORY — DX: Transient cerebral ischemic attack, unspecified: G45.9

## 2020-01-28 HISTORY — DX: Cerebral infarction, unspecified: I63.9

## 2020-01-28 HISTORY — DX: Tobacco use: Z72.0

## 2020-01-28 LAB — ECHOCARDIOGRAM COMPLETE
Height: 72 in
Weight: 3120 oz

## 2020-01-28 LAB — TSH: TSH: 1.784 u[IU]/mL (ref 0.350–4.500)

## 2020-01-28 LAB — RAPID URINE DRUG SCREEN, HOSP PERFORMED
Amphetamines: NOT DETECTED
Barbiturates: NOT DETECTED
Benzodiazepines: NOT DETECTED
Cocaine: NOT DETECTED
Opiates: NOT DETECTED
Tetrahydrocannabinol: POSITIVE — AB

## 2020-01-28 LAB — SARS CORONAVIRUS 2 BY RT PCR (DIASORIN): SARS Coronavirus 2: NEGATIVE

## 2020-01-28 LAB — LIPID PANEL
Cholesterol: 206 mg/dL — ABNORMAL HIGH (ref 0–200)
HDL: 31 mg/dL — ABNORMAL LOW (ref >40)
LDL Cholesterol: 144 mg/dL — ABNORMAL HIGH (ref 0–99)
Total CHOL/HDL Ratio: 6.6 RATIO
Triglycerides: 154 mg/dL — ABNORMAL HIGH (ref <150)
VLDL: 31 mg/dL (ref 0–40)

## 2020-01-28 LAB — HIV ANTIBODY (ROUTINE TESTING W REFLEX): HIV Screen 4th Generation wRfx: NONREACTIVE

## 2020-01-28 LAB — HEMOGLOBIN A1C
Hgb A1c MFr Bld: 5.7 % — ABNORMAL HIGH (ref 4.8–5.6)
Mean Plasma Glucose: 116.89 mg/dL

## 2020-01-28 LAB — VITAMIN B12: Vitamin B-12: 156 pg/mL — ABNORMAL LOW (ref 180–914)

## 2020-01-28 MED ORDER — SODIUM CHLORIDE 0.9 % IV SOLN: INTRAVENOUS | Status: DC

## 2020-01-28 MED ORDER — ENOXAPARIN SODIUM 40 MG/0.4ML ~~LOC~~ SOLN
40.0000 mg | SUBCUTANEOUS | Status: DC
  Administered 2020-01-28 – 2020-01-30 (×3): 40 mg via SUBCUTANEOUS
  Filled 2020-01-28 (×3): qty 0.4

## 2020-01-28 MED ORDER — SENNOSIDES-DOCUSATE SODIUM 8.6-50 MG PO TABS: 1.0000 | ORAL_TABLET | Freq: Every evening | ORAL | Status: DC | PRN

## 2020-01-28 MED ORDER — ATORVASTATIN CALCIUM 40 MG PO TABS
40.0000 mg | ORAL_TABLET | Freq: Every day | ORAL | Status: DC
  Administered 2020-01-28 – 2020-01-29 (×2): 40 mg via ORAL
  Filled 2020-01-28 (×2): qty 1

## 2020-01-28 MED ORDER — ACETAMINOPHEN 325 MG PO TABS: 650.0000 mg | ORAL_TABLET | ORAL | Status: DC | PRN

## 2020-01-28 MED ORDER — ACETAMINOPHEN 160 MG/5ML PO SOLN: 650.0000 mg | ORAL | Status: DC | PRN

## 2020-01-28 MED ORDER — CYANOCOBALAMIN 1000 MCG/ML IJ SOLN
1000.0000 ug | Freq: Once | INTRAMUSCULAR | Status: AC
  Administered 2020-01-28: 1000 ug via INTRAMUSCULAR
  Filled 2020-01-28: qty 1

## 2020-01-28 MED ORDER — ACETAMINOPHEN 650 MG RE SUPP: 650.0000 mg | RECTAL | Status: DC | PRN

## 2020-01-28 MED ORDER — CLOPIDOGREL BISULFATE 75 MG PO TABS
75.0000 mg | ORAL_TABLET | Freq: Every day | ORAL | Status: DC
  Administered 2020-01-28 – 2020-01-30 (×3): 75 mg via ORAL
  Filled 2020-01-28 (×3): qty 1

## 2020-01-28 MED ORDER — ASPIRIN EC 325 MG PO TBEC
325.0000 mg | DELAYED_RELEASE_TABLET | Freq: Every day | ORAL | Status: DC
  Administered 2020-01-28 – 2020-01-30 (×3): 325 mg via ORAL
  Filled 2020-01-28 (×3): qty 1

## 2020-01-28 MED ORDER — PERFLUTREN LIPID MICROSPHERE
1.0000 mL | INTRAVENOUS | Status: AC | PRN
  Administered 2020-01-28: 10:00:00 2 mL via INTRAVENOUS
  Filled 2020-01-28: qty 10

## 2020-01-28 MED ORDER — STROKE: EARLY STAGES OF RECOVERY BOOK
Freq: Once | Status: DC
  Filled 2020-01-28: qty 1

## 2020-01-28 NOTE — ED Notes (Signed)
RN went to complete NIH again and found pt appeared to be unable to hold his Left arm up against gravity.  When RN left the room, another RN found that he was in fact able to raise his left arm but he dropped it when this RN returned to room.

## 2020-01-28 NOTE — Evaluation (Signed)
Occupational Therapy Evaluation Patient Details Name: Cody Baird MRN: [id number] DOB: 11-09-1961 Today's Date: 01/28/2020    History of Present Illness Cody Baird is a 59 y.o. male with peripheral artery disease who does not seek medical care presents to the emergency department for approximately 1 day history of left-sided weakness.  PMH positive for 2 back surgeries, PVD, tobacco use, R LE femoral endarterectomy/thrombectomy/bypass and 3 toes ampuated.   Clinical Impression   Pt with decline in function and safety with ADLs and ADL mobility with impaired strength, balance, endurance, vision (L neglect/inattention, impaired L side awareness), L UE function (impaired tone and AROM, L UE weakness) and cognition. Pt lives at home alone and was independent with ADLs, IADLs, home mgt, was working, but does not drive. Pt currently requires min A with bed mobility to sit EOB, Poor sitting and standing balance, requires min A for balance/support and demos L leaning, max A with UB ADLs, total A with LB ADLs, set up with self feeding and min A with simple grooming. Pt would benefit from acute OT services to address impairments to maximize level of function and safety    Follow Up Recommendations  CIR    Equipment Recommendations  Other (comment)(TBD at next venue of care)    Recommendations for Other Services Rehab consult     Precautions / Restrictions Precautions Precautions: Fall Precaution Comments: L side weakness Restrictions Weight Bearing Restrictions: No      Mobility Bed Mobility Overal bed mobility: Needs Assistance Bed Mobility: Supine to Sit     Supine to sit: HOB elevated;Min assist     General bed mobility comments: cues for technique, scooting to EOB, assist for stability due to L lateral lean  Transfers Overall transfer level: Needs assistance Equipment used: Rolling walker (2 wheeled) Transfers: Sit to/from Stand Sit to Stand: From elevated surface;Mod  assist         General transfer comment: some assist for balance/lifting    Balance Overall balance assessment: Needs assistance Sitting-balance support: Feet supported Sitting balance-Leahy Scale: Poor Sitting balance - Comments: L lateral lean min A for balance Postural control: Left lateral lean Standing balance support: Bilateral upper extremity supported Standing balance-Leahy Scale: Poor Standing balance comment: min to mod A for balance with UE support                           ADL either performed or assessed with clinical judgement   ADL Overall ADL's : Needs assistance/impaired Eating/Feeding: Set up;Sitting   Grooming: Wash/dry hands;Wash/dry face;Minimal assistance;Sitting   Upper Body Bathing: Maximal assistance   Lower Body Bathing: Total assistance   Upper Body Dressing : Maximal assistance   Lower Body Dressing: Total assistance   Toilet Transfer: Cueing for safety;Cueing for sequencing;RW Toilet Transfer Details (indicate cue type and reason): simulated to chair Toileting- Clothing Manipulation and Hygiene: Total assistance       Functional mobility during ADLs: Moderate assistance;Cueing for safety;Cueing for sequencing;Rolling walker General ADL Comments: pt sat EOB x 10 minutes. Pt with Poor sitting balance and required min A for balance/support     Vision Baseline Vision/History: Wears glasses Wears Glasses: Reading only Patient Visual Report: No change from baseline(pt demos L neglect/inattention) Vision Assessment?: Vision impaired- to be further tested in functional context Tracking/Visual Pursuits: Impaired - to be further tested in functional context Additional Comments: pt turns head to left and to mindline when instructerd and able to identify objects/itmes when  asked     Perception     Praxis      Pertinent Vitals/Pain Pain Assessment: No/denies pain     Hand Dominance Right   Extremity/Trunk Assessment Upper  Extremity Assessment Upper Extremity Assessment: LUE deficits/detail LUE Deficits / Details: AROM impaired, AAROM WFL, strength shoulder flexion 3-/5, elbow extension/flexion 3-/5, zero grip LUE Sensation: decreased light touch   Lower Extremity Assessment Lower Extremity Assessment: LLE deficits/detail;RLE deficits/detail RLE Deficits / Details: AROM/strength WFL, numbness/burning/tingling in foot h/o neuropathy RLE Sensation: decreased light touch;history of peripheral neuropathy LLE Deficits / Details: AROM WFL, strength hip flexion 4-/5, knee extension 4/5, ankle DF 2/5   Cervical / Trunk Assessment Cervical / Trunk Assessment: Other exceptions Cervical / Trunk Exceptions: elongation of L trunk and L lateral trunk lean   Communication Communication Communication: No difficulties   Cognition Arousal/Alertness: Awake/alert Behavior During Therapy: WFL for tasks assessed/performed Overall Cognitive Status: Impaired/Different from baseline Area of Impairment: Attention;Safety/judgement;Following commands                   Current Attention Level: Sustained   Following Commands: Follows one step commands consistently;Follows one step commands with increased time Safety/Judgement: Decreased awareness of safety;Decreased awareness of deficits         General Comments  R head turn in sitting with decreased L side awareness    Exercises     Shoulder Instructions      Home Living Family/patient expects to be discharged to:: Private residence Living Arrangements: Alone Available Help at Discharge: Friend(s);Available PRN/intermittently Type of Home: Apartment Home Access: Stairs to enter Entrance Stairs-Number of Steps: 2-3 Entrance Stairs-Rails: Right Home Layout: One level     Bathroom Shower/Tub: Chief Strategy Officer: (comfort height toilet per pt)     Home Equipment: None          Prior Functioning/Environment Level of Independence:  Independent        Comments: independent with ADLs/selfcare, IADLs, home mgt, was working, does not drive        OT Problem List: Decreased strength;Impaired balance (sitting and/or standing);Decreased cognition;Decreased knowledge of precautions;Impaired tone;Decreased range of motion;Impaired vision/perception;Decreased safety awareness;Decreased activity tolerance;Decreased coordination;Decreased knowledge of use of DME or AE;Impaired sensation;Impaired UE functional use      OT Treatment/Interventions: Self-care/ADL training;DME and/or AE instruction;Therapeutic activities;Balance training;Therapeutic exercise;Cognitive remediation/compensation;Neuromuscular education;Patient/family education    OT Goals(Current goals can be found in the care plan section) Acute Rehab OT Goals Patient Stated Goal: to return to independent OT Goal Formulation: With patient Time For Goal Achievement: 02/11/20 Potential to Achieve Goals: Good ADL Goals Pt Will Perform Grooming: with min guard assist;sitting Pt Will Perform Upper Body Bathing: with mod assist;sitting Pt Will Perform Lower Body Bathing: with max assist;with mod assist;sitting/lateral leans Pt Will Transfer to Toilet: with min assist;stand pivot transfer Additional ADL Goal #1: Pt will tolerate AAROM of L UE in multiple planes Additional ADL Goal #2: Pt will attend to midline and L side to identify and gather items for functional tasks  OT Frequency: Min 2X/week   Barriers to D/C:            Co-evaluation              AM-PAC OT "6 Clicks" Daily Activity     Outcome Measure Help from another person eating meals?: A Little Help from another person taking care of personal grooming?: A Little Help from another person toileting, which includes using toliet, bedpan, or urinal?: Total Help from  another person bathing (including washing, rinsing, drying)?: Total Help from another person to put on and taking off regular upper body  clothing?: A Lot Help from another person to put on and taking off regular lower body clothing?: Total 6 Click Score: 11   End of Session Equipment Utilized During Treatment: Gait belt;Rolling walker  Activity Tolerance: Patient limited by fatigue Patient left: in bed;with call bell/phone within reach;with bed alarm set  OT Visit Diagnosis: Unsteadiness on feet (R26.81);Other abnormalities of gait and mobility (R26.89);Muscle weakness (generalized) (M62.81);Other symptoms and signs involving cognitive function;Hemiplegia and hemiparesis Hemiplegia - Right/Left: Left Hemiplegia - dominant/non-dominant: Non-Dominant Hemiplegia - caused by: Cerebral infarction                Time: 3474-2595 OT Time Calculation (min): 31 min Charges:  OT General Charges $OT Visit: 1 Visit OT Evaluation $OT Eval Moderate Complexity: 1 Mod OT Treatments $Therapeutic Activity: 8-22 mins    Britt Bottom 01/28/2020, 12:50 PM

## 2020-01-28 NOTE — H&P (Signed)
History and Physical   Cody Baird OZH:[id number] DOB: 1961/01/25 DOA: 01/27/2020  Referring MD/NP/PA: Dr. Particia Nearing  PCP: Patient, No Pcp Per   Outpatient Specialists: Dr. Tawanna Cooler early, vascular surgeon  Patient coming from: Home  Chief Complaint: Left-sided weakness  HPI: Cody Baird is a 59 y.o. male with medical history significant of peripheral vascular disease, tobacco abuse, history of previous vascular ulcer, chronic low back pain and bronchitis who came to the ER with left-sided weakness lasting about a month.  Patient said he went to bed around 9 PM without any problem.  He woke up this morning with left-sided weakness associated with slurred speech.  He also had worsening progressively through the day.  Was later brought to the ER where he was seen and evaluated.  He was seen to have facial droop and left arm weakness.  A code stroke was called.  Patient is outside the window for TPA administration.  Initial evaluation including head CT without contrast is negative for CVA.  Patient being admitted to the hospital for full CVA work-up.  He has slightly improved.  Speech is slightly better but not completely back to normal.  Still has some mild weakness on the left..  ED Course: Temperature 97.5 blood pressure 156/101 pulse 101 respirate of 22 oxygen sats 97% room air.  White count is 11.1 hemoglobin 18.4 the rest of the chemistry and CBC appear to be within normal.  Head CT without contrast is negative CT angiogram of the head and neck also showed no large vessel occlusion.  Patient being admitted for further work-up.  Review of Systems: As per HPI otherwise 10 point review of systems negative.    Past Medical History:  Diagnosis Date  . Arthritis    low back and left hip  . Chronic back pain   . Headache(784.0)    occasionally and sinus related   . History of bronchitis    last time about 2-3yrs ago  . PAD (peripheral artery disease) (HCC)   . Peripheral vascular disease (HCC)    . Prolapsed, anus    hx of    Past Surgical History:  Procedure Laterality Date  . ABDOMINAL AORTAGRAM N/A 06/26/2013   Procedure: ABDOMINAL AORTAGRAM;  Surgeon: Larina Earthly, MD;  Location: [facility] CATH LAB;  Service: Cardiovascular;  Laterality: N/A;  . AMPUTATION Right 12/04/2013   Procedure: AMPUTATION DIGIT- 3RD TOE RIGHT FOOT ;  Surgeon: Larina Earthly, MD;  Location: [facility] OR;  Service: Vascular;  Laterality: Right;  . AMPUTATION Right 01/15/2014   Procedure: AMPUTATION OF RIGHT 4TH and 5TH TOES;  Surgeon: Larina Earthly, MD;  Location: [facility] OR;  Service: Vascular;  Laterality: Right;  . BACK SURGERY     L1-2  . BACK SURGERY     L1-2  . BACK SURGERY     thinks L1- 2  . COLON SURGERY     removal of colon due to proplase rectum  . COLOSTOMY    . COLOSTOMY CLOSURE  2006ish  . ESOPHAGOGASTRODUODENOSCOPY    . FEMORAL-TIBIAL BYPASS GRAFT Right 06/28/2013   Procedure: BYPASS GRAFT FEMORAL TO TIBIAL PERONEAL TRUNK ARTERY;  Surgeon: Larina Earthly, MD;  Location: [facility] OR;  Service: Vascular;  Laterality: Right;     reports that he has been smoking cigarettes. He has a 18.00 pack-year smoking history. He has never used smokeless tobacco. He reports that he does not drink alcohol or use drugs.  Allergies  Allergen Reactions  . Penicillins Other (  See Comments)    Childhood allergy not sure what the reaction is  Did it involve swelling of the face/tongue/throat, SOB, or low BP? Unknown Did it involve sudden or severe rash/hives, skin peeling, or any reaction on the inside of your mouth or nose? Unknown Did you need to seek medical attention at a hospital or doctor's office? Unknown When did it last happen?childhood If all above answers are "NO", may proceed with cephalosporin use.     Family History  Problem Relation Age of Onset  . Cancer Mother   . Diabetes Mother   . Diabetes Father   . Hyperlipidemia Father   . Hypertension Father   . Heart attack Father      Prior to Admission  medications   Not on File    Physical Exam: Vitals:   01/27/20 2206 01/27/20 2215 01/27/20 2215 01/27/20 2230  BP:  (!) 147/101 (!) 147/101 (!) 147/99  Pulse:   77 80  Resp:   20 (!) 22  Temp:      TempSrc:      SpO2:   100% 97%  Weight: 88.5 kg     Height: 6' (1.829 m)         Constitutional: Stable, mildly anxious Vitals:   01/27/20 2206 01/27/20 2215 01/27/20 2215 01/27/20 2230  BP:  (!) 147/101 (!) 147/101 (!) 147/99  Pulse:   77 80  Resp:   20 (!) 22  Temp:      TempSrc:      SpO2:   100% 97%  Weight: 88.5 kg     Height: 6' (1.829 m)      Eyes: PERRL, lids and conjunctivae normal ENMT: Mucous membranes are moist. Posterior pharynx clear of any exudate or lesions.Normal dentition.  Neck: normal, supple, no masses, no thyromegaly Respiratory: clear to auscultation bilaterally, no wheezing, no crackles. Normal respiratory effort. No accessory muscle use.  Cardiovascular: Regular rate and rhythm, no murmurs / rubs / gallops. No extremity edema. 2+ pedal pulses. No carotid bruits.  Abdomen: no tenderness, no masses palpated. No hepatosplenomegaly. Bowel sounds positive.  Musculoskeletal: no clubbing / cyanosis. No joint deformity upper and lower extremities. Good ROM, no contractures. Normal muscle tone.  Skin: no rashes, lesions, ulcers. No induration Neurologic: CN 2-12 grossly intact. Sensation intact, mild left-sided droop, power is 3 out of 5 left upper extremity, 5 out of 5 all other extremities, reflexes intact psychiatric: Normal judgment and insight. Alert and oriented x 3. Normal mood.     Labs on Admission: I have personally reviewed following labs and imaging studies  CBC: Recent Labs  Lab 01/27/20 2126 01/27/20 2133  WBC 11.1*  --   NEUTROABS 8.3*  --   HGB 17.3* 18.4*  HCT 54.7* 54.0*  MCV 94.6  --   PLT 223  --    Basic Metabolic Panel: Recent Labs  Lab 01/27/20 2126 01/27/20 2133  NA 140 140  K 4.3 3.9  CL 106 106  CO2 22  --   GLUCOSE  134* 131*  BUN 9 11  CREATININE 1.03 1.00  CALCIUM 9.4  --    GFR: Estimated Creatinine Clearance: 89.5 mL/min (by C-G formula based on SCr of 1 mg/dL). Liver Function Tests: Recent Labs  Lab 01/27/20 2126  AST 18  ALT 17  ALKPHOS 64  BILITOT 0.5  PROT 7.7  ALBUMIN 4.1   No results for input(s): LIPASE, AMYLASE in the last 168 hours. No results for input(s): AMMONIA in the last  168 hours. Coagulation Profile: Recent Labs  Lab 01/27/20 2126  INR 1.0   Cardiac Enzymes: No results for input(s): CKTOTAL, CKMB, CKMBINDEX, TROPONINI in the last 168 hours. BNP (last 3 results) No results for input(s): PROBNP in the last 8760 hours. HbA1C: No results for input(s): HGBA1C in the last 72 hours. CBG: Recent Labs  Lab 01/27/20 2123  GLUCAP 128*   Lipid Profile: No results for input(s): CHOL, HDL, LDLCALC, TRIG, CHOLHDL, LDLDIRECT in the last 72 hours. Thyroid Function Tests: No results for input(s): TSH, T4TOTAL, FREET4, T3FREE, THYROIDAB in the last 72 hours. Anemia Panel: No results for input(s): VITAMINB12, FOLATE, FERRITIN, TIBC, IRON, RETICCTPCT in the last 72 hours. Urine analysis:    Component Value Date/Time   COLORURINE YELLOW 06/26/2013 [place] 06/26/2013 1652   LABSPEC <1.005 (L) 06/26/2013 1652   PHURINE 7.5 06/26/2013 1652   GLUCOSEU NEGATIVE 06/26/2013 1652   HGBUR NEGATIVE 06/26/2013 1652   BILIRUBINUR NEGATIVE 06/26/2013 1652   KETONESUR NEGATIVE 06/26/2013 1652   PROTEINUR NEGATIVE 06/26/2013 1652   UROBILINOGEN 1.0 06/26/2013 1652   NITRITE NEGATIVE 06/26/2013 1652   LEUKOCYTESUR NEGATIVE 06/26/2013 1652   Sepsis Labs: @LABRCNTIP (procalcitonin:4,lacticidven:4) )No results found for this or any previous visit (from the past 240 hour(s)).   Radiological Exams on Admission: CT ANGIO HEAD W OR WO CONTRAST  Result Date: 01/27/2020 CLINICAL DATA:  Stroke, follow-up. Additional history provided: Facial droop, left arm weakness. EXAM:  CT ANGIOGRAPHY HEAD AND NECK TECHNIQUE: Multidetector CT imaging of the head and neck was performed using the standard protocol during bolus administration of intravenous contrast. Multiplanar CT image reconstructions and MIPs were obtained to evaluate the vascular anatomy. Carotid stenosis measurements (when applicable) are obtained utilizing NASCET criteria, using the distal internal carotid diameter as the denominator. CONTRAST:  69mL OMNIPAQUE IOHEXOL 350 MG/ML SOLN COMPARISON:  Noncontrast head CT performed earlier the same day 01/27/2020. FINDINGS: CTA NECK FINDINGS Aortic arch: Standard aortic branching. The visualized aortic arch is unremarkable. No significant innominate or proximal subclavian artery stenosis. Right carotid system: CCA and ICA patent within the neck without stenosis. Tortuosity of the cervical ICA. Left carotid system: CCA and ICA patent within the neck without stenosis. Mild mixed plaque within the proximal ICA. Vertebral arteries: Left vertebral artery dominant. The vertebral arteries are patent within the neck bilaterally without significant stenosis. Skeleton: No acute bony abnormality or aggressive osseous lesion. Cervical spondylosis. Most notably there is a C5-C6 disc bulge with associated endplate spurring and osteophyte ridge. Bilateral neural foraminal narrowing at least mild bony spinal canal stenosis at this level. Other neck: No neck mass or cervical lymphadenopathy. Upper chest: Centrilobular emphysema. Review of the MIP images confirms the above findings CTA HEAD FINDINGS Anterior circulation: The intracranial internal carotid arteries are patent without significant stenosis. There is a moderate to moderately advanced stenosis within the distal M1 right MCA (series 11, image 58) (series 6, image 289). No right M2 proximal branch occlusion is identified. The M1 left middle cerebral artery is patent without significant stenosis. No left M2 proximal branch occlusion is  identified. The anterior cerebral arteries are patent without high-grade proximal stenosis. The A1 left ACA is dominant. There is an apparent high-grade stenosis within the A3-A4 left ACA (series 10, image 30). No intracranial aneurysm is identified. Posterior circulation: The intracranial internal carotid arteries are patent. Mixed plaque results in sites of up to moderate stenosis within the intracranial right vertebral artery. No more than mild atherosclerotic narrowing of the intracranial  left vertebral artery. The basilar artery is patent with mild atherosclerotic irregularity. The posterior cerebral arteries are patent bilaterally without significant proximal stenosis. A left posterior communicating artery is present. A right posterior communicating artery is not definitively identified. Venous sinuses: Within limitations of contrast timing, no convincing thrombus. Anatomic variants: As described. Review of the MIP images confirms the above findings These results were called by telephone at the time of interpretation on 01/27/2020 at 10:20 pm to provider Arther Dames , who verbally acknowledged these results. IMPRESSION: CTA neck: 1. The bilateral common carotid, internal carotid and vertebral arteries are patent within the neck without significant stenosis. Mild mixed plaque within the proximal left ICA. 2. Pulmonary emphysema. CTA head: 1. No intracranial large vessel occlusion. 2. Atherosclerotic disease with multifocal stenoses, most notably as follows. 3. Moderate to moderately advanced stenosis within the distal M1 right MCA. 4. Apparent high-grade focal stenosis within an A3-A4 left ACA branch vessel. 5. Sites of up to moderate atherosclerotic narrowing within the intracranial right vertebral artery. Electronically Signed   By: Jackey Loge DO   On: 01/27/2020 22:20   CT ANGIO NECK W OR WO CONTRAST  Result Date: 01/27/2020 CLINICAL DATA:  Stroke, follow-up. Additional history provided: Facial  droop, left arm weakness. EXAM: CT ANGIOGRAPHY HEAD AND NECK TECHNIQUE: Multidetector CT imaging of the head and neck was performed using the standard protocol during bolus administration of intravenous contrast. Multiplanar CT image reconstructions and MIPs were obtained to evaluate the vascular anatomy. Carotid stenosis measurements (when applicable) are obtained utilizing NASCET criteria, using the distal internal carotid diameter as the denominator. CONTRAST:  31mL OMNIPAQUE IOHEXOL 350 MG/ML SOLN COMPARISON:  Noncontrast head CT performed earlier the same day 01/27/2020. FINDINGS: CTA NECK FINDINGS Aortic arch: Standard aortic branching. The visualized aortic arch is unremarkable. No significant innominate or proximal subclavian artery stenosis. Right carotid system: CCA and ICA patent within the neck without stenosis. Tortuosity of the cervical ICA. Left carotid system: CCA and ICA patent within the neck without stenosis. Mild mixed plaque within the proximal ICA. Vertebral arteries: Left vertebral artery dominant. The vertebral arteries are patent within the neck bilaterally without significant stenosis. Skeleton: No acute bony abnormality or aggressive osseous lesion. Cervical spondylosis. Most notably there is a C5-C6 disc bulge with associated endplate spurring and osteophyte ridge. Bilateral neural foraminal narrowing at least mild bony spinal canal stenosis at this level. Other neck: No neck mass or cervical lymphadenopathy. Upper chest: Centrilobular emphysema. Review of the MIP images confirms the above findings CTA HEAD FINDINGS Anterior circulation: The intracranial internal carotid arteries are patent without significant stenosis. There is a moderate to moderately advanced stenosis within the distal M1 right MCA (series 11, image 58) (series 6, image 289). No right M2 proximal branch occlusion is identified. The M1 left middle cerebral artery is patent without significant stenosis. No left M2  proximal branch occlusion is identified. The anterior cerebral arteries are patent without high-grade proximal stenosis. The A1 left ACA is dominant. There is an apparent high-grade stenosis within the A3-A4 left ACA (series 10, image 30). No intracranial aneurysm is identified. Posterior circulation: The intracranial internal carotid arteries are patent. Mixed plaque results in sites of up to moderate stenosis within the intracranial right vertebral artery. No more than mild atherosclerotic narrowing of the intracranial left vertebral artery. The basilar artery is patent with mild atherosclerotic irregularity. The posterior cerebral arteries are patent bilaterally without significant proximal stenosis. A left posterior communicating artery is present. A right  posterior communicating artery is not definitively identified. Venous sinuses: Within limitations of contrast timing, no convincing thrombus. Anatomic variants: As described. Review of the MIP images confirms the above findings These results were called by telephone at the time of interpretation on 01/27/2020 at 10:20 pm to provider Arther DamesSUSHANTH AROOR , who verbally acknowledged these results. IMPRESSION: CTA neck: 1. The bilateral common carotid, internal carotid and vertebral arteries are patent within the neck without significant stenosis. Mild mixed plaque within the proximal left ICA. 2. Pulmonary emphysema. CTA head: 1. No intracranial large vessel occlusion. 2. Atherosclerotic disease with multifocal stenoses, most notably as follows. 3. Moderate to moderately advanced stenosis within the distal M1 right MCA. 4. Apparent high-grade focal stenosis within an A3-A4 left ACA branch vessel. 5. Sites of up to moderate atherosclerotic narrowing within the intracranial right vertebral artery. Electronically Signed   By: Jackey LogeKyle  Golden DO   On: 01/27/2020 22:20   CT HEAD CODE STROKE WO CONTRAST  Result Date: 01/27/2020 CLINICAL DATA:  Code stroke. Facial droop;  left arm weakness, last known well 2300 yesterday. EXAM: CT HEAD WITHOUT CONTRAST TECHNIQUE: Contiguous axial images were obtained from the base of the skull through the vertex without intravenous contrast. COMPARISON:  No pertinent prior studies available for comparison. FINDINGS: Brain: There is no evidence of acute intracranial hemorrhage, intracranial mass, midline shift or extra-axial fluid collection.No demarcated cortical infarction. Cerebral volume is normal for age. Vascular: No hyperdense vessel. Skull: Normal. Negative for fracture or focal lesion. Sinuses/Orbits: Visualized orbits demonstrate no acute abnormality. Mild ethmoid sinus mucosal thickening. No significant mastoid effusion. These results were called by telephone at the time of interpretation on 01/27/2020 at 9:36 pm to provider Dr. Laurence SlateAroor, who verbally acknowledged these results. IMPRESSION: No CT evidence of acute intracranial abnormality. Electronically Signed   By: Jackey LogeKyle  Golden DO   On: 01/27/2020 21:37    EKG: Independently reviewed.  Shows sinus rhythm with a rate of 79.  No significant ST changes  Assessment/Plan Principal Problem:   TIA (transient ischemic attack) Active Problems:   PVD (peripheral vascular disease) (HCC)   Tobacco abuse   Essential hypertension     #1 TIA versus CVA: Head CT is negative but symptoms suggest acute CVA.  Patient has left upper extremity weakness now.  We will admit and complete work-up including MRI of the brain, echocardiogram,.  Consider carotid Doppler if recommended by neurology.  Will follow further testing as recommended by neurology.  #2 hypertension: Patient has elevated blood pressure.  Has not been seen a physician.  Not on any home medications for that.  We will monitor.  If MRI is negative we will treat aggressively.  If positive will do permissive hypertension for now.  #3 tobacco abuse: Patient smokes about a pack per day.  He has promised to quit from today.  He refused  nicotine patch.  Counseling provided  #3 peripheral vascular disease: Stable.  Continue to monitor.  DVT prophylaxis: Lovenox Code Status: Full code Family Communication: No family at bedside Disposition Plan: Home Consults called: Dr. Jerrell [place] neurology Admission status: Observation  Severity of Illness: The appropriate patient status for this patient is OBSERVATION. Observation status is judged to be reasonable and necessary in order to provide the required intensity of service to ensure the patient's safety. The patient's presenting symptoms, physical exam findings, and initial radiographic and laboratory data in the context of their medical condition is felt to place them at decreased risk for further clinical deterioration. Furthermore, it  is anticipated that the patient will be medically stable for discharge from the hospital within 2 midnights of admission. The following factors support the patient status of observation.   " The patient's presenting symptoms include left sided weakness. " The physical exam findings include left upper extremity weakness. " The initial radiographic and laboratory data are negative head CT.     Lonia Blood MD [facility] Pager [phone]  If 7PM-7AM, please contact night-coverage [email or web address] Password [facility]  01/28/2020, 12:07 AM

## 2020-01-28 NOTE — Progress Notes (Signed)
Progress Note    PERKINS MOLINA  IDP:[id number] DOB: [birth year]  DOA: 01/27/2020 PCP: Patient, No Pcp Per    Brief Narrative:   Chief complaint: left Sided weakness  Medical records reviewed and are as summarized below:  RAYLAND HAMED is an 59 y.o. male past medical history that includes PVD status post femoropopliteal, tobacco use, hypertension noted to the emergency department March 15 chief complaint left-sided weakness.  Reported awakening that morning with left-sided weakness and slurred speech.  Work-up in the emergency department concerning for TIA versus stroke.  MRI done revealing multifocal acute/early subacute anterior right MCA.   Assessment/Plan:   Principal Problem:   Stroke [facility]) Active Problems:   Essential hypertension   PVD (peripheral vascular disease) (HCC)   Tobacco abuse   #1.  Stroke.  Patient with left-sided weakness and slurred speech.  CT of the head did not show acute findings and CT angiogram was negative for large vessel occlusion.  Patient was outside the window for IV TPA.  MRI reveals multifocal acute/early subacute anterior right MCA infarct. evaluated by neurology who opine multifocal right MCA territory infarct embolic secondary to unknown source.  Echocardiogram with an ejection fraction of 60 to 65%, lipid panel with cholesterol 206, triglycerides 154, HDL 31, LDL 144.  Hemoglobin A1c 5.7.  Plavix and statin and aspirin started.  A weighted by physical therapy who recommend inpatient rehab. -OT -Stroke team recommendations  #2.  Hypertension.  Blood pressure elevated on presentation.  Home medications include no antihypertensive.  Today's blood pressure remains elevated but improved. -Monitor -Recommendations per neuro  #3.  Peripheral vascular disease.  Stable at baseline.  Chart review indicates patient lost any follow-up.  Continues to smoke  #4.  Tobacco use.  Cessation counseling offered.  Nicotine patch declined   Family  Communication/Anticipated D/C date and plan/Code Status   DVT prophylaxis: Lovenox ordered. Code Status: Full Code.  Family Communication: Attempted to call daughter no answer Disposition Plan: To be determined.  CIR recommended   Medical Consultants:    Xu neuro   Anti-Infectives:    None  Subjective:   Lying in bed awake alert denies pain or discomfort.  Objective:    Vitals:   01/28/20 0246 01/28/20 0500 01/28/20 0741 01/28/20 1010  BP: (!) 148/84 (!) 147/91 (!) 135/95 (!) 147/88  Pulse: 64 69 83 73  Resp: 17 16 18 17   Temp: (!) 97.5 F (36.4 C) 98.6 F (37 C) 98.3 F (36.8 C) 98 F (36.7 C)  TempSrc: Oral Oral Oral Oral  SpO2: 96% 95% 94% 91%  Weight:      Height:        Intake/Output Summary (Last 24 hours) at 01/28/2020 1134 Last data filed at 01/28/2020 0556 Gross per 24 hour  Intake 69.81 ml  Output --  Net 69.81 ml   Filed Weights   01/27/20 2206  Weight: 88.5 kg    Exam: General: Awake alert well-nourished no acute distress CV regular rate and rhythm no murmur gallop or rub no lower extremity edema Respiratory: No increased work of breathing breath sounds are distant slightly coarse but I hear air movement.  No wheeze no rhonchi Abdomen: Soft nondistended positive bowel sounds throughout nontender to palpation no mass organomegaly noted Musculoskeletal: Joints without swelling/erythema  Neuro: Alert and oriented x3 speech slow but clear unable to grip or move left arm.  Left leg with 4 out of 5 strength.  Data Reviewed:   I have personally  reviewed following labs and imaging studies:  Labs: Labs show the following:   Basic Metabolic Panel: Recent Labs  Lab 01/27/20 2126 01/27/20 2133  NA 140 140  K 4.3 3.9  CL 106 106  CO2 22  --   GLUCOSE 134* 131*  BUN 9 11  CREATININE 1.03 1.00  CALCIUM 9.4  --    GFR Estimated Creatinine Clearance: 89.5 mL/min (by C-G formula based on SCr of 1 mg/dL). Liver Function Tests: Recent Labs   Lab 01/27/20 2126  AST 18  ALT 17  ALKPHOS 64  BILITOT 0.5  PROT 7.7  ALBUMIN 4.1   No results for input(s): LIPASE, AMYLASE in the last 168 hours. No results for input(s): AMMONIA in the last 168 hours. Coagulation profile Recent Labs  Lab 01/27/20 2126  INR 1.0    CBC: Recent Labs  Lab 01/27/20 2126 01/27/20 2133  WBC 11.1*  --   NEUTROABS 8.3*  --   HGB 17.3* 18.4*  HCT 54.7* 54.0*  MCV 94.6  --   PLT 223  --    Cardiac Enzymes: No results for input(s): CKTOTAL, CKMB, CKMBINDEX, TROPONINI in the last 168 hours. BNP (last 3 results) No results for input(s): PROBNP in the last 8760 hours. CBG: Recent Labs  Lab 01/27/20 2123  GLUCAP 128*   D-Dimer: No results for input(s): DDIMER in the last 72 hours. Hgb A1c: Recent Labs    01/28/20 0612  HGBA1C 5.7*   Lipid Profile: Recent Labs    01/28/20 0612  CHOL 206*  HDL 31*  LDLCALC 144*  TRIG 154*  CHOLHDL 6.6   Thyroid function studies: No results for input(s): TSH, T4TOTAL, T3FREE, THYROIDAB in the last 72 hours.  Invalid input(s): FREET3 Anemia work up: No results for input(s): VITAMINB12, FOLATE, FERRITIN, TIBC, IRON, RETICCTPCT in the last 72 hours. Sepsis Labs: Recent Labs  Lab 01/27/20 2126  WBC 11.1*    Microbiology Recent Results (from the past 240 hour(s))  SARS Coronavirus 2 by RT PCR     Status: None   Collection Time: 01/28/20  2:18 AM  Result Value Ref Range Status   SARS Coronavirus 2 NEGATIVE NEGATIVE Final    Comment: (NOTE) Result indicates the ABSENCE of SARS-CoV-2 RNA in the patient specimen.  The lowest concentration of SARS-CoV-2 viral copies this assay can detect in nasopharyngeal swab specimens is 500 copies / mL.  A negative result does not preclude SARS-CoV-2 infection and should not be used as the sole basis for patient management decisions. A negative result may occur with improper specimen collection / handling, submission of a specimen other than  nasopharyngeal swab, presence of viral mutation(s) within the areas targeted by this assay, and inadequate number of viral copies (<500 copies / mL) present.  Negative results must be combined with clinical observations, patient history, and epidemiological information.  The expected result is NEGATIVE.  Patient Fact Sheet:  https://wong-henderson.biz/   Provider Fact Sheet:  CheapJackpot.at   This test is not yet approved or cleared by the [place] FDA and  has been authorized for  detection and/or diagnosis of SARS-CoV-2 by FDA under an Emergency Use Authorization (EUA).  This EUA will remain in effect (meaning this test can be used) for the duration of  the COVID-19 declaration under Section 564(b)(1) of the Act, 21 U.S.C. section 360bbb-3(b)(1), unless the authorization is terminated or revoked sooner Performed at [facility], [address] [place], [place] [address]     Procedures and  diagnostic studies:  CT ANGIO HEAD W OR WO CONTRAST  Result Date: 01/27/2020 CLINICAL DATA:  Stroke, follow-up. Additional history provided: Facial droop, left arm weakness. EXAM: CT ANGIOGRAPHY HEAD AND NECK TECHNIQUE: Multidetector CT imaging of the head and neck was performed using the standard protocol during bolus administration of intravenous contrast. Multiplanar CT image reconstructions and MIPs were obtained to evaluate the vascular anatomy. Carotid stenosis measurements (when applicable) are obtained utilizing NASCET criteria, using the distal internal carotid diameter as the denominator. CONTRAST:  75mL OMNIPAQUE IOHEXOL 350 MG/ML SOLN COMPARISON:  Noncontrast head CT performed earlier the same day 01/27/2020. FINDINGS: CTA NECK FINDINGS Aortic arch: Standard aortic branching. The visualized aortic arch is unremarkable. No significant innominate or proximal subclavian artery stenosis. Right carotid system: CCA and ICA patent within the neck  without stenosis. Tortuosity of the cervical ICA. Left carotid system: CCA and ICA patent within the neck without stenosis. Mild mixed plaque within the proximal ICA. Vertebral arteries: Left vertebral artery dominant. The vertebral arteries are patent within the neck bilaterally without significant stenosis. Skeleton: No acute bony abnormality or aggressive osseous lesion. Cervical spondylosis. Most notably there is a C5-C6 disc bulge with associated endplate spurring and osteophyte ridge. Bilateral neural foraminal narrowing at least mild bony spinal canal stenosis at this level. Other neck: No neck mass or cervical lymphadenopathy. Upper chest: Centrilobular emphysema. Review of the MIP images confirms the above findings CTA HEAD FINDINGS Anterior circulation: The intracranial internal carotid arteries are patent without significant stenosis. There is a moderate to moderately advanced stenosis within the distal M1 right MCA (series 11, image 58) (series 6, image 289). No right M2 proximal branch occlusion is identified. The M1 left middle cerebral artery is patent without significant stenosis. No left M2 proximal branch occlusion is identified. The anterior cerebral arteries are patent without high-grade proximal stenosis. The A1 left ACA is dominant. There is an apparent high-grade stenosis within the A3-A4 left ACA (series 10, image 30). No intracranial aneurysm is identified. Posterior circulation: The intracranial internal carotid arteries are patent. Mixed plaque results in sites of up to moderate stenosis within the intracranial right vertebral artery. No more than mild atherosclerotic narrowing of the intracranial left vertebral artery. The basilar artery is patent with mild atherosclerotic irregularity. The posterior cerebral arteries are patent bilaterally without significant proximal stenosis. A left posterior communicating artery is present. A right posterior communicating artery is not definitively  identified. Venous sinuses: Within limitations of contrast timing, no convincing thrombus. Anatomic variants: As described. Review of the MIP images confirms the above findings These results were called by telephone at the time of interpretation on 01/27/2020 at 10:20 pm to provider Arther Dames , who verbally acknowledged these results. IMPRESSION: CTA neck: 1. The bilateral common carotid, internal carotid and vertebral arteries are patent within the neck without significant stenosis. Mild mixed plaque within the proximal left ICA. 2. Pulmonary emphysema. CTA head: 1. No intracranial large vessel occlusion. 2. Atherosclerotic disease with multifocal stenoses, most notably as follows. 3. Moderate to moderately advanced stenosis within the distal M1 right MCA. 4. Apparent high-grade focal stenosis within an A3-A4 left ACA branch vessel. 5. Sites of up to moderate atherosclerotic narrowing within the intracranial right vertebral artery. Electronically Signed   By: Jackey Loge DO   On: 01/27/2020 22:20   CT ANGIO NECK W OR WO CONTRAST  Result Date: 01/27/2020 CLINICAL DATA:  Stroke, follow-up. Additional history provided: Facial droop, left arm weakness. EXAM: CT ANGIOGRAPHY HEAD AND  NECK TECHNIQUE: Multidetector CT imaging of the head and neck was performed using the standard protocol during bolus administration of intravenous contrast. Multiplanar CT image reconstructions and MIPs were obtained to evaluate the vascular anatomy. Carotid stenosis measurements (when applicable) are obtained utilizing NASCET criteria, using the distal internal carotid diameter as the denominator. CONTRAST:  32mL OMNIPAQUE IOHEXOL 350 MG/ML SOLN COMPARISON:  Noncontrast head CT performed earlier the same day 01/27/2020. FINDINGS: CTA NECK FINDINGS Aortic arch: Standard aortic branching. The visualized aortic arch is unremarkable. No significant innominate or proximal subclavian artery stenosis. Right carotid system: CCA and ICA  patent within the neck without stenosis. Tortuosity of the cervical ICA. Left carotid system: CCA and ICA patent within the neck without stenosis. Mild mixed plaque within the proximal ICA. Vertebral arteries: Left vertebral artery dominant. The vertebral arteries are patent within the neck bilaterally without significant stenosis. Skeleton: No acute bony abnormality or aggressive osseous lesion. Cervical spondylosis. Most notably there is a C5-C6 disc bulge with associated endplate spurring and osteophyte ridge. Bilateral neural foraminal narrowing at least mild bony spinal canal stenosis at this level. Other neck: No neck mass or cervical lymphadenopathy. Upper chest: Centrilobular emphysema. Review of the MIP images confirms the above findings CTA HEAD FINDINGS Anterior circulation: The intracranial internal carotid arteries are patent without significant stenosis. There is a moderate to moderately advanced stenosis within the distal M1 right MCA (series 11, image 58) (series 6, image 289). No right M2 proximal branch occlusion is identified. The M1 left middle cerebral artery is patent without significant stenosis. No left M2 proximal branch occlusion is identified. The anterior cerebral arteries are patent without high-grade proximal stenosis. The A1 left ACA is dominant. There is an apparent high-grade stenosis within the A3-A4 left ACA (series 10, image 30). No intracranial aneurysm is identified. Posterior circulation: The intracranial internal carotid arteries are patent. Mixed plaque results in sites of up to moderate stenosis within the intracranial right vertebral artery. No more than mild atherosclerotic narrowing of the intracranial left vertebral artery. The basilar artery is patent with mild atherosclerotic irregularity. The posterior cerebral arteries are patent bilaterally without significant proximal stenosis. A left posterior communicating artery is present. A right posterior communicating artery  is not definitively identified. Venous sinuses: Within limitations of contrast timing, no convincing thrombus. Anatomic variants: As described. Review of the MIP images confirms the above findings These results were called by telephone at the time of interpretation on 01/27/2020 at 10:20 pm to provider Samara Snide , who verbally acknowledged these results. IMPRESSION: CTA neck: 1. The bilateral common carotid, internal carotid and vertebral arteries are patent within the neck without significant stenosis. Mild mixed plaque within the proximal left ICA. 2. Pulmonary emphysema. CTA head: 1. No intracranial large vessel occlusion. 2. Atherosclerotic disease with multifocal stenoses, most notably as follows. 3. Moderate to moderately advanced stenosis within the distal M1 right MCA. 4. Apparent high-grade focal stenosis within an A3-A4 left ACA branch vessel. 5. Sites of up to moderate atherosclerotic narrowing within the intracranial right vertebral artery. Electronically Signed   By: Kellie Simmering DO   On: 01/27/2020 22:20   MR BRAIN WO CONTRAST  Result Date: 01/28/2020 CLINICAL DATA:  Left-sided weakness EXAM: MRI HEAD WITHOUT CONTRAST TECHNIQUE: Multiplanar, multiecho pulse sequences of the brain and surrounding structures were obtained without intravenous contrast. Coronal T2-weighted imaging and susceptibility weighted imaging were not acquired. COMPARISON:  CTA head 01/27/2020 FINDINGS: Brain: Multifocal abnormal diffusion restriction within the anterior right MCA territory,  including along the right precentral gyrus and the right basal ganglia. No hemorrhage or mass effect. Multifocal white matter hyperintensity, most commonly due to chronic ischemic microangiopathy. Normal volume of CSF spaces. No chronic microhemorrhage. Normal midline structures. Vascular: Normal flow voids. Skull and upper cervical spine: Normal marrow signal. Sinuses/Orbits: Negative. Other: None. IMPRESSION: Multifocal acute/early  subacute within the anterior right MCA territory. No hemorrhage or mass effect. Electronically Signed   By: Deatra RobinsonKevin  Herman M.D.   On: 01/28/2020 01:39   ECHOCARDIOGRAM COMPLETE  Result Date: 01/28/2020    ECHOCARDIOGRAM REPORT   Patient Name:   Zollie BeckersWALTER A Hilton Date of Exam: 01/28/2020 Medical Rec #:  [id number]    Height:       72.0 in Accession #:    [id number]   Weight:       195.0 lb Date of Birth:  Dec 19, 1960    BSA:          2.108 m Patient Age:    58 years     BP:           135/95 mmHg Patient Gender: M            HR:           83 bpm. Exam Location:  Inpatient Procedure: 2D Echo and Intracardiac Opacification Agent Indications:    TIA 435.9 / G45.9  History:        Patient has prior history of Echocardiogram examinations, most                 recent 06/29/2013. PAD.  Sonographer:    Leta Junglingiffany Cooper RDCS Referring Phys: [phone] [facility]  Sonographer Comments: Technically difficult study due to poor echo windows. IMPRESSIONS  1. Left ventricular ejection fraction, by estimation, is 60 to 65%. The left ventricle has normal function. The left ventricle has no regional wall motion abnormalities. Left ventricular diastolic function could not be evaluated.  2. Right ventricular systolic function was not well visualized. The right ventricular size is not well visualized.  3. The mitral valve is grossly normal. No evidence of mitral valve regurgitation.  4. The aortic valve was not well visualized. Aortic valve regurgitation is not visualized.  5. Very limited echo, however, LV function appears normal. FINDINGS  Left Ventricle: Left ventricular ejection fraction, by estimation, is 60 to 65%. The left ventricle has normal function. The left ventricle has no regional wall motion abnormalities. Definity contrast agent was given IV to delineate the left ventricular  endocardial borders. The left ventricular internal cavity size was normal in size. There is no left ventricular hypertrophy. Left ventricular diastolic  function could not be evaluated. Right Ventricle: The right ventricular size is not well visualized. Right vetricular wall thickness was not assessed. Right ventricular systolic function was not well visualized. Left Atrium: Left atrial size was normal in size. Right Atrium: Right atrial size was normal in size. Pericardium: There is no evidence of pericardial effusion. Mitral Valve: The mitral valve is grossly normal. No evidence of mitral valve regurgitation. Tricuspid Valve: The tricuspid valve is not well visualized. Tricuspid valve regurgitation is not demonstrated. Aortic Valve: The aortic valve was not well visualized. Aortic valve regurgitation is not visualized. Pulmonic Valve: The pulmonic valve was not well visualized. Pulmonic valve regurgitation is not visualized. Aorta: The aortic root was not well visualized. Venous: The inferior vena cava was not well visualized. IAS/Shunts: The interatrial septum was not well visualized.  LEFT VENTRICLE PLAX 2D LVIDd:  5.60 cm  Diastology LVIDs:         3.30 cm  LV e' lateral:   8.27 cm/s LV PW:         1.10 cm  LV E/e' lateral: 6.9 LV IVS:        1.10 cm  LV e' medial:    7.94 cm/s LVOT diam:     2.10 cm  LV E/e' medial:  7.2 LVOT Area:     3.46 cm  MITRAL VALVE MV Area (PHT): 5.02 cm    SHUNTS MV Decel Time: 151 msec    Systemic Diam: 2.10 cm MV E velocity: 56.80 cm/s MV A velocity: 39.90 cm/s MV E/A ratio:  1.42 Zoila Shutter MD Electronically signed by Zoila Shutter MD Signature Date/Time: 01/28/2020/11:11:55 AM    Final    CT HEAD CODE STROKE WO CONTRAST  Result Date: 01/27/2020 CLINICAL DATA:  Code stroke. Facial droop; left arm weakness, last known well 2300 yesterday. EXAM: CT HEAD WITHOUT CONTRAST TECHNIQUE: Contiguous axial images were obtained from the base of the skull through the vertex without intravenous contrast. COMPARISON:  No pertinent prior studies available for comparison. FINDINGS: Brain: There is no evidence of acute intracranial  hemorrhage, intracranial mass, midline shift or extra-axial fluid collection.No demarcated cortical infarction. Cerebral volume is normal for age. Vascular: No hyperdense vessel. Skull: Normal. Negative for fracture or focal lesion. Sinuses/Orbits: Visualized orbits demonstrate no acute abnormality. Mild ethmoid sinus mucosal thickening. No significant mastoid effusion. These results were called by telephone at the time of interpretation on 01/27/2020 at 9:36 pm to provider Dr. Laurence Slate, who verbally acknowledged these results. IMPRESSION: No CT evidence of acute intracranial abnormality. Electronically Signed   By: Jackey Loge DO   On: 01/27/2020 21:37    Medications:   .  stroke: mapping our early stages of recovery book   Does not apply Once  . aspirin EC  325 mg Oral Daily  . atorvastatin  40 mg Oral q1800  . clopidogrel  75 mg Oral Daily  . enoxaparin (LOVENOX) injection  40 mg Subcutaneous Q24H  . sodium chloride flush  3 mL Intravenous Once   Continuous Infusions: . sodium chloride 100 mL/hr at 01/28/20 0509     LOS: 0 days   Gwenyth Bender NP [facility]   How to contact the [facility] Attending or Consulting provider 7A - 7P or covering provider during after hours 7P -7A, for this patient?  1. Check the care team in [facility] and look for a) attending/consulting [facility] provider listed and b) the [facility] team listed 2. Log into [email or web address] and use 's universal password to access. If you do not have the password, please contact the hospital operator. 3. Locate the [facility] provider you are looking for under [facility] and page to a number that you can be directly reached. 4. If you still have difficulty reaching the provider, please page the [facility] (Director on Call) for the Hospitalists listed on amion for assistance.  01/28/2020, 11:34 AM

## 2020-01-28 NOTE — Progress Notes (Signed)
Bilateral lower extremity venous duplex exam completed.  Preliminary results can be found under CV proc under chart review.  01/28/2020 2:16 PM  Matheus Spiker, K., RDMS, RVT

## 2020-01-28 NOTE — Consult Note (Signed)
Physical Medicine and Rehabilitation Consult   Reason for Consult: Stroke with functional deficits.  Referring Physician: Dr. Benjamine Mola   HPI: Cody Baird is a 59 y.o. male with history of PVD, PAD, chronic low back pain, ongoing tobacco use--2 PPD;  who was admitted on last night with reports of progressive left-sided weakness and slurred speech throughout the day.  CT head was negative for acute findings.  CTA head/neck showed moderate to moderately advanced stenosis within the distal M1 right MCA and high-grade focal stenosis A3-A4 left ACA branch with mixed plaque within proximal left ICA.  Incidental note made of pulmonary emphysema.  MRI brain showed multifocal acute/subacute within anterior right MCA territory.  2D echo showed EF of 60 to 65% without wall abnormality.  Labs showed evidence of polycythemia and vitamin B12 levels low. Stroke felt to embolic due to unknown source and work up underway. DAPT X 3 months followed by ASA recommended given high grade intracranial stenosis.  Pt reports very sedated and cannot stay awake.  Does note "LUE won't do right" and it will shake when he stretches. Pt also notes his colostomy that he had previously was reversed.   Per his daughter, he doesn't drive- and his mother is in poor health- pt's daughter works full time and lives in [place], pt in [place], [place] and daughter reports hx of frequent falls due to poor balance, R toes removal.      Review of Systems  Constitutional: Negative for chills and fever.  HENT: Negative for hearing loss and tinnitus.   Eyes: Negative for blurred vision and double vision.  Respiratory: Negative for cough and shortness of breath.   Cardiovascular: Negative for chest pain and palpitations.  Gastrointestinal: Negative for abdominal pain, heartburn and nausea.  Genitourinary: Negative for dysuria and urgency.  Musculoskeletal: Negative for back pain and myalgias.  Skin: Negative for itching and rash.   Neurological: Positive for sensory change (sensory deficits), focal weakness and weakness. Negative for dizziness and headaches.  Psychiatric/Behavioral: The patient is not nervous/anxious and does not have insomnia.      Past Medical History:  Diagnosis Date  . Arthritis    low back and left hip  . Chronic back pain   . Headache(784.0)    occasionally and sinus related   . History of bronchitis    last time about 2-59yrs ago  . PAD (peripheral artery disease) (HCC)   . Peripheral vascular disease (HCC)   . Prolapsed, anus    hx of    Past Surgical History:  Procedure Laterality Date  . ABDOMINAL AORTAGRAM N/A 06/26/2013   Procedure: ABDOMINAL AORTAGRAM;  Surgeon: Larina Earthly, MD;  Location: [facility] CATH LAB;  Service: Cardiovascular;  Laterality: N/A;  . AMPUTATION Right 12/04/2013   Procedure: AMPUTATION DIGIT- 3RD TOE RIGHT FOOT ;  Surgeon: Larina Earthly, MD;  Location: [facility] OR;  Service: Vascular;  Laterality: Right;  . AMPUTATION Right 01/15/2014   Procedure: AMPUTATION OF RIGHT 4TH and 5TH TOES;  Surgeon: Larina Earthly, MD;  Location: [facility] OR;  Service: Vascular;  Laterality: Right;  . BACK SURGERY     L1-2  . BACK SURGERY     L1-2  . BACK SURGERY     thinks L1- 2  . COLON SURGERY     removal of colon due to proplase rectum  . COLOSTOMY    . COLOSTOMY CLOSURE  2006ish  . ESOPHAGOGASTRODUODENOSCOPY    . FEMORAL-TIBIAL BYPASS GRAFT Right  06/28/2013   Procedure: BYPASS GRAFT FEMORAL TO TIBIAL PERONEAL TRUNK ARTERY;  Surgeon: Larina Earthly, MD;  Location: [facility] OR;  Service: Vascular;  Laterality: Right;    Family History  Problem Relation Age of Onset  . Cancer Mother   . Diabetes Mother   . Diabetes Father   . Hyperlipidemia Father   . Hypertension Father   . Heart attack Father     Social History:  Lives alone--used to work in [employer]. Has not worked for past 3-4 years and reports that he "does nothing" during the day.   He reports that he has been smoking cigarettes 2PPD  currently. Has smoked for 40 years.  He has never used smokeless tobacco. He reports that he does not drink alcohol or use drugs.    Allergies  Allergen Reactions  . Penicillins Other (See Comments)    Childhood allergy not sure what the reaction is  Did it involve swelling of the face/tongue/throat, SOB, or low BP? Unknown Did it involve sudden or severe rash/hives, skin peeling, or any reaction on the inside of your mouth or nose? Unknown Did you need to seek medical attention at a hospital or doctor's office? Unknown When did it last happen?childhood If all above answers are "NO", may proceed with cephalosporin use.    No medications prior to admission.    Home: Home Living Family/patient expects to be discharged to:: Private residence Living Arrangements: Alone Available Help at Discharge: Friend(s), Available PRN/intermittently Type of Home: Apartment Home Access: Stairs to enter [employer] of Steps: 2-3 Entrance Stairs-Rails: Right Home Layout: One level Bathroom Shower/Tub: Engineer, manufacturing systems: (comfort) Home Equipment: None  Functional History: Prior Function Level of Independence: Independent Comments: works Dance movement psychotherapist Status:  Mobility: Bed Mobility Overal bed mobility: Needs Assistance Bed Mobility: Supine to Sit Supine to sit: HOB elevated, Min assist General bed mobility comments: cues for technique, scooting to EOB, assist for stability due to L lateral lean Transfers Overall transfer level: Needs assistance Equipment used: Rolling walker (2 wheeled) Transfers: Sit to/from Stand Sit to Stand: From elevated surface, Mod assist General transfer comment: some assist for balance/lifting Ambulation/Gait Ambulation/Gait assistance: Mod assist Gait Distance (Feet): 25 Feet Assistive device: Rolling walker (2 wheeled) Gait Pattern/deviations: Step-to pattern, Step-through pattern, Decreased stride length,  Decreased dorsiflexion - left, Wide base of support General Gait Details: assist to keep L hand on walker, assist for walker management and balance due to L lateral lean    ADL:    Cognition: Cognition Overall Cognitive Status: Impaired/Different from baseline Orientation Level: Oriented X4 Cognition Arousal/Alertness: Awake/alert Behavior During Therapy: WFL for tasks assessed/performed Overall Cognitive Status: Impaired/Different from baseline Area of Impairment: Attention, Safety/judgement, Following commands Current Attention Level: Sustained Following Commands: Follows one step commands consistently, Follows one step commands with increased time Safety/Judgement: Decreased awareness of safety, Decreased awareness of deficits   Blood pressure (!) 147/88, pulse 73, temperature 98 F (36.7 C), temperature source Oral, resp. rate 17, height 6' (1.829 m), weight 88.5 kg, SpO2 91 %. Physical Exam  Nursing note and vitals reviewed. Constitutional: He appears well-developed and well-nourished.  Sitting up in bed; eyes closed off and on- then fell completely asleep at end of exam/interview after 10-15 minutes of fighting sleep. No acute distress  Daughter right outside room- spoke with her as well,   HENT:  Head: Normocephalic and atraumatic.  Nose: Nose normal.  Mouth/Throat: Oropharynx is clear and moist. No oropharyngeal exudate.  Pt has L  facial droop- tongue appears midline, but won't stick out all the way.  Says facial sensation intact, but falling asleep.   Eyes:  EOMI B/L no nystagmus seen  Neck: No tracheal deviation present.  Cardiovascular:  RRR- no M/R/G  Respiratory: No stridor. He has rhonchi.  CTA B/L- except a little coarse at bases B/L- no W/R/R  GI:  Soft, NT, ND, hyperactive BS  Musculoskeletal:     Cervical back: Normal range of motion and neck supple.     Comments: Right 3-5th toe amputation sites well healed.   RUE and RLE 5/5 in deltoid, biceps,  triceps, WE, grip and finger abd; HF, KE, KF, DF and PF  LUE- hard ot get pt to try- says "nothing there", however strength 2/5 in deltoids, biceps, triceps, and WE; 2-/5 grip and 0/5 finger abd- hand already in fist.   LLE- HF, KE, KF, DF and PF 5-/5  Neurological: He is alert.  Pt very muzzy headed- said he knew in hospital and month but didn't know day/date.   L inattention to entire L side.  Also when yawned or stretched, L arm would posture upwards and shake like a Palsy type arm.   Per daughter, also speech is impaired- not speaking like he normally does- was using very spare language- only a few words- no descriptors.    Skin: Skin is warm and dry.  Psychiatric:  Very sleepy- hard to arouse; no significant spontaneous speech.     Results for orders placed or performed during the hospital encounter of 01/27/20 (from the past 24 hour(s))  CBG monitoring, ED     Status: Abnormal   Collection Time: 01/27/20  9:23 PM  Result Value Ref Range   Glucose-Capillary 128 (H) 70 - 99 mg/dL  Protime-INR     Status: None   Collection Time: 01/27/20  9:26 PM  Result Value Ref Range   Prothrombin Time 12.5 11.4 - 15.2 seconds   INR 1.0 0.8 - 1.2  APTT     Status: None   Collection Time: 01/27/20  9:26 PM  Result Value Ref Range   aPTT 27 24 - 36 seconds  CBC     Status: Abnormal   Collection Time: 01/27/20  9:26 PM  Result Value Ref Range   WBC 11.1 (H) 4.0 - 10.5 K/uL   RBC 5.78 4.22 - 5.81 MIL/uL   Hemoglobin 17.3 (H) 13.0 - 17.0 g/dL   HCT 70.1 (H) 77.9 - 39.0 %   MCV 94.6 80.0 - 100.0 fL   MCH 29.9 26.0 - 34.0 pg   MCHC 31.6 30.0 - 36.0 g/dL   RDW 30.0 92.3 - 30.0 %   Platelets 223 150 - 400 K/uL   nRBC 0.0 0.0 - 0.2 %  Differential     Status: Abnormal   Collection Time: 01/27/20  9:26 PM  Result Value Ref Range   Neutrophils Relative % 75 %   Neutro Abs 8.3 (H) 1.7 - 7.7 K/uL   Lymphocytes Relative 17 %   Lymphs Abs 1.9 0.7 - 4.0 K/uL   Monocytes Relative 7 %    Monocytes Absolute 0.8 0.1 - 1.0 K/uL   Eosinophils Relative 0 %   Eosinophils Absolute 0.0 0.0 - 0.5 K/uL   Basophils Relative 1 %   Basophils Absolute 0.1 0.0 - 0.1 K/uL   Immature Granulocytes 0 %   Abs Immature Granulocytes 0.04 0.00 - 0.07 K/uL  Comprehensive metabolic panel     Status: Abnormal  Collection Time: 01/27/20  9:26 PM  Result Value Ref Range   Sodium 140 135 - 145 mmol/L   Potassium 4.3 3.5 - 5.1 mmol/L   Chloride 106 98 - 111 mmol/L   CO2 22 22 - 32 mmol/L   Glucose, Bld 134 (H) 70 - 99 mg/dL   BUN 9 6 - 20 mg/dL   Creatinine, Ser 1.03 0.61 - 1.24 mg/dL   Calcium 9.4 8.9 - 10.3 mg/dL   Total Protein 7.7 6.5 - 8.1 g/dL   Albumin 4.1 3.5 - 5.0 g/dL   AST 18 15 - 41 U/L   ALT 17 0 - 44 U/L   Alkaline Phosphatase 64 38 - 126 U/L   Total Bilirubin 0.5 0.3 - 1.2 mg/dL   GFR calc non Af Amer >60 >60 mL/min   GFR calc Af Amer >60 >60 mL/min   Anion gap 12 5 - 15  I-stat chem 8, ED     Status: Abnormal   Collection Time: 01/27/20  9:33 PM  Result Value Ref Range   Sodium 140 135 - 145 mmol/L   Potassium 3.9 3.5 - 5.1 mmol/L   Chloride 106 98 - 111 mmol/L   BUN 11 6 - 20 mg/dL   Creatinine, Ser 1.00 0.61 - 1.24 mg/dL   Glucose, Bld 131 (H) 70 - 99 mg/dL   Calcium, Ion 1.11 (L) 1.15 - 1.40 mmol/L   TCO2 23 22 - 32 mmol/L   Hemoglobin 18.4 (H) 13.0 - 17.0 g/dL   HCT 54.0 (H) 39.0 - 52.0 %  SARS Coronavirus 2 by RT PCR     Status: None   Collection Time: 01/28/20  2:18 AM  Result Value Ref Range   SARS Coronavirus 2 NEGATIVE NEGATIVE  Hemoglobin A1c     Status: Abnormal   Collection Time: 01/28/20  6:12 AM  Result Value Ref Range   Hgb A1c MFr Bld 5.7 (H) 4.8 - 5.6 %   Mean Plasma Glucose 116.89 mg/dL  Lipid panel     Status: Abnormal   Collection Time: 01/28/20  6:12 AM  Result Value Ref Range   Cholesterol 206 (H) 0 - 200 mg/dL   Triglycerides 154 (H) <150 mg/dL   HDL 31 (L) >40 mg/dL   Total CHOL/HDL Ratio 6.6 RATIO   VLDL 31 0 - 40 mg/dL   LDL  Cholesterol 144 (H) 0 - 99 mg/dL  TSH     Status: None   Collection Time: 01/28/20 10:41 AM  Result Value Ref Range   TSH 1.784 0.350 - 4.500 uIU/mL  Vitamin B12     Status: Abnormal   Collection Time: 01/28/20 10:41 AM  Result Value Ref Range   Vitamin B-12 156 (L) 180 - 914 pg/mL   CT ANGIO HEAD W OR WO CONTRAST  Result Date: 01/27/2020 CLINICAL DATA:  Stroke, follow-up. Additional history provided: Facial droop, left arm weakness. EXAM: CT ANGIOGRAPHY HEAD AND NECK TECHNIQUE: Multidetector CT imaging of the head and neck was performed using the standard protocol during bolus administration of intravenous contrast. Multiplanar CT image reconstructions and MIPs were obtained to evaluate the vascular anatomy. Carotid stenosis measurements (when applicable) are obtained utilizing NASCET criteria, using the distal internal carotid diameter as the denominator. CONTRAST:  76mL OMNIPAQUE IOHEXOL 350 MG/ML SOLN COMPARISON:  Noncontrast head CT performed earlier the same day 01/27/2020. FINDINGS: CTA NECK FINDINGS Aortic arch: Standard aortic branching. The visualized aortic arch is unremarkable. No significant innominate or proximal subclavian artery stenosis. Right carotid  system: CCA and ICA patent within the neck without stenosis. Tortuosity of the cervical ICA. Left carotid system: CCA and ICA patent within the neck without stenosis. Mild mixed plaque within the proximal ICA. Vertebral arteries: Left vertebral artery dominant. The vertebral arteries are patent within the neck bilaterally without significant stenosis. Skeleton: No acute bony abnormality or aggressive osseous lesion. Cervical spondylosis. Most notably there is a C5-C6 disc bulge with associated endplate spurring and osteophyte ridge. Bilateral neural foraminal narrowing at least mild bony spinal canal stenosis at this level. Other neck: No neck mass or cervical lymphadenopathy. Upper chest: Centrilobular emphysema. Review of the MIP images  confirms the above findings CTA HEAD FINDINGS Anterior circulation: The intracranial internal carotid arteries are patent without significant stenosis. There is a moderate to moderately advanced stenosis within the distal M1 right MCA (series 11, image 58) (series 6, image 289). No right M2 proximal branch occlusion is identified. The M1 left middle cerebral artery is patent without significant stenosis. No left M2 proximal branch occlusion is identified. The anterior cerebral arteries are patent without high-grade proximal stenosis. The A1 left ACA is dominant. There is an apparent high-grade stenosis within the A3-A4 left ACA (series 10, image 30). No intracranial aneurysm is identified. Posterior circulation: The intracranial internal carotid arteries are patent. Mixed plaque results in sites of up to moderate stenosis within the intracranial right vertebral artery. No more than mild atherosclerotic narrowing of the intracranial left vertebral artery. The basilar artery is patent with mild atherosclerotic irregularity. The posterior cerebral arteries are patent bilaterally without significant proximal stenosis. A left posterior communicating artery is present. A right posterior communicating artery is not definitively identified. Venous sinuses: Within limitations of contrast timing, no convincing thrombus. Anatomic variants: As described. Review of the MIP images confirms the above findings These results were called by telephone at the time of interpretation on 01/27/2020 at 10:20 pm to provider Arther Dames , who verbally acknowledged these results. IMPRESSION: CTA neck: 1. The bilateral common carotid, internal carotid and vertebral arteries are patent within the neck without significant stenosis. Mild mixed plaque within the proximal left ICA. 2. Pulmonary emphysema. CTA head: 1. No intracranial large vessel occlusion. 2. Atherosclerotic disease with multifocal stenoses, most notably as follows. 3. Moderate  to moderately advanced stenosis within the distal M1 right MCA. 4. Apparent high-grade focal stenosis within an A3-A4 left ACA branch vessel. 5. Sites of up to moderate atherosclerotic narrowing within the intracranial right vertebral artery. Electronically Signed   By: Jackey Loge DO   On: 01/27/2020 22:20   CT ANGIO NECK W OR WO CONTRAST  Result Date: 01/27/2020 CLINICAL DATA:  Stroke, follow-up. Additional history provided: Facial droop, left arm weakness. EXAM: CT ANGIOGRAPHY HEAD AND NECK TECHNIQUE: Multidetector CT imaging of the head and neck was performed using the standard protocol during bolus administration of intravenous contrast. Multiplanar CT image reconstructions and MIPs were obtained to evaluate the vascular anatomy. Carotid stenosis measurements (when applicable) are obtained utilizing NASCET criteria, using the distal internal carotid diameter as the denominator. CONTRAST:  75mL OMNIPAQUE IOHEXOL 350 MG/ML SOLN COMPARISON:  Noncontrast head CT performed earlier the same day 01/27/2020. FINDINGS: CTA NECK FINDINGS Aortic arch: Standard aortic branching. The visualized aortic arch is unremarkable. No significant innominate or proximal subclavian artery stenosis. Right carotid system: CCA and ICA patent within the neck without stenosis. Tortuosity of the cervical ICA. Left carotid system: CCA and ICA patent within the neck without stenosis. Mild mixed plaque within the  proximal ICA. Vertebral arteries: Left vertebral artery dominant. The vertebral arteries are patent within the neck bilaterally without significant stenosis. Skeleton: No acute bony abnormality or aggressive osseous lesion. Cervical spondylosis. Most notably there is a C5-C6 disc bulge with associated endplate spurring and osteophyte ridge. Bilateral neural foraminal narrowing at least mild bony spinal canal stenosis at this level. Other neck: No neck mass or cervical lymphadenopathy. Upper chest: Centrilobular emphysema. Review  of the MIP images confirms the above findings CTA HEAD FINDINGS Anterior circulation: The intracranial internal carotid arteries are patent without significant stenosis. There is a moderate to moderately advanced stenosis within the distal M1 right MCA (series 11, image 58) (series 6, image 289). No right M2 proximal branch occlusion is identified. The M1 left middle cerebral artery is patent without significant stenosis. No left M2 proximal branch occlusion is identified. The anterior cerebral arteries are patent without high-grade proximal stenosis. The A1 left ACA is dominant. There is an apparent high-grade stenosis within the A3-A4 left ACA (series 10, image 30). No intracranial aneurysm is identified. Posterior circulation: The intracranial internal carotid arteries are patent. Mixed plaque results in sites of up to moderate stenosis within the intracranial right vertebral artery. No more than mild atherosclerotic narrowing of the intracranial left vertebral artery. The basilar artery is patent with mild atherosclerotic irregularity. The posterior cerebral arteries are patent bilaterally without significant proximal stenosis. A left posterior communicating artery is present. A right posterior communicating artery is not definitively identified. Venous sinuses: Within limitations of contrast timing, no convincing thrombus. Anatomic variants: As described. Review of the MIP images confirms the above findings These results were called by telephone at the time of interpretation on 01/27/2020 at 10:20 pm to provider Arther DamesSUSHANTH AROOR , who verbally acknowledged these results. IMPRESSION: CTA neck: 1. The bilateral common carotid, internal carotid and vertebral arteries are patent within the neck without significant stenosis. Mild mixed plaque within the proximal left ICA. 2. Pulmonary emphysema. CTA head: 1. No intracranial large vessel occlusion. 2. Atherosclerotic disease with multifocal stenoses, most notably as  follows. 3. Moderate to moderately advanced stenosis within the distal M1 right MCA. 4. Apparent high-grade focal stenosis within an A3-A4 left ACA branch vessel. 5. Sites of up to moderate atherosclerotic narrowing within the intracranial right vertebral artery. Electronically Signed   By: Jackey LogeKyle  Golden DO   On: 01/27/2020 22:20   MR BRAIN WO CONTRAST  Result Date: 01/28/2020 CLINICAL DATA:  Left-sided weakness EXAM: MRI HEAD WITHOUT CONTRAST TECHNIQUE: Multiplanar, multiecho pulse sequences of the brain and surrounding structures were obtained without intravenous contrast. Coronal T2-weighted imaging and susceptibility weighted imaging were not acquired. COMPARISON:  CTA head 01/27/2020 FINDINGS: Brain: Multifocal abnormal diffusion restriction within the anterior right MCA territory, including along the right precentral gyrus and the right basal ganglia. No hemorrhage or mass effect. Multifocal white matter hyperintensity, most commonly due to chronic ischemic microangiopathy. Normal volume of CSF spaces. No chronic microhemorrhage. Normal midline structures. Vascular: Normal flow voids. Skull and upper cervical spine: Normal marrow signal. Sinuses/Orbits: Negative. Other: None. IMPRESSION: Multifocal acute/early subacute within the anterior right MCA territory. No hemorrhage or mass effect. Electronically Signed   By: Deatra RobinsonKevin  Herman M.D.   On: 01/28/2020 01:39   ECHOCARDIOGRAM COMPLETE  Result Date: 01/28/2020    ECHOCARDIOGRAM REPORT   Patient Name:   Cody Baird Date of Exam: 01/28/2020 Medical Rec #:  [id number]    Height:       72.0 in Accession #:  [id number]   Weight:       195.0 lb Date of Birth:  06-01-1961    BSA:          2.108 m Patient Age:    58 years     BP:           135/95 mmHg Patient Gender: M            HR:           83 bpm. Exam Location:  Inpatient Procedure: 2D Echo and Intracardiac Opacification Agent Indications:    TIA 435.9 / G45.9  History:        Patient has prior history of  Echocardiogram examinations, most                 recent 06/29/2013. PAD.  Sonographer:    Leta Jungling RDCS Referring Phys: [phone] [facility] GARBA  Sonographer Comments: Technically difficult study due to poor echo windows. IMPRESSIONS  1. Left ventricular ejection fraction, by estimation, is 60 to 65%. The left ventricle has normal function. The left ventricle has no regional wall motion abnormalities. Left ventricular diastolic function could not be evaluated.  2. Right ventricular systolic function was not well visualized. The right ventricular size is not well visualized.  3. The mitral valve is grossly normal. No evidence of mitral valve regurgitation.  4. The aortic valve was not well visualized. Aortic valve regurgitation is not visualized.  5. Very limited echo, however, LV function appears normal. FINDINGS  Left Ventricle: Left ventricular ejection fraction, by estimation, is 60 to 65%. The left ventricle has normal function. The left ventricle has no regional wall motion abnormalities. Definity contrast agent was given IV to delineate the left ventricular  endocardial borders. The left ventricular internal cavity size was normal in size. There is no left ventricular hypertrophy. Left ventricular diastolic function could not be evaluated. Right Ventricle: The right ventricular size is not well visualized. Right vetricular wall thickness was not assessed. Right ventricular systolic function was not well visualized. Left Atrium: Left atrial size was normal in size. Right Atrium: Right atrial size was normal in size. Pericardium: There is no evidence of pericardial effusion. Mitral Valve: The mitral valve is grossly normal. No evidence of mitral valve regurgitation. Tricuspid Valve: The tricuspid valve is not well visualized. Tricuspid valve regurgitation is not demonstrated. Aortic Valve: The aortic valve was not well visualized. Aortic valve regurgitation is not visualized. Pulmonic Valve: The pulmonic  valve was not well visualized. Pulmonic valve regurgitation is not visualized. Aorta: The aortic root was not well visualized. Venous: The inferior vena cava was not well visualized. IAS/Shunts: The interatrial septum was not well visualized.  LEFT VENTRICLE PLAX 2D LVIDd:         5.60 cm  Diastology LVIDs:         3.30 cm  LV e' lateral:   8.27 cm/s LV PW:         1.10 cm  LV E/e' lateral: 6.9 LV IVS:        1.10 cm  LV e' medial:    7.94 cm/s LVOT diam:     2.10 cm  LV E/e' medial:  7.2 LVOT Area:     3.46 cm  MITRAL VALVE MV Area (PHT): 5.02 cm    SHUNTS MV Decel Time: 151 msec    Systemic Diam: 2.10 cm MV E velocity: 56.80 cm/s MV A velocity: 39.90 cm/s MV E/A ratio:  1.42 Zoila Shutter MD Electronically signed  by Zoila Shutter MD Signature Date/Time: 01/28/2020/11:11:55 AM    Final    CT HEAD CODE STROKE WO CONTRAST  Result Date: 01/27/2020 CLINICAL DATA:  Code stroke. Facial droop; left arm weakness, last known well 2300 yesterday. EXAM: CT HEAD WITHOUT CONTRAST TECHNIQUE: Contiguous axial images were obtained from the base of the skull through the vertex without intravenous contrast. COMPARISON:  No pertinent prior studies available for comparison. FINDINGS: Brain: There is no evidence of acute intracranial hemorrhage, intracranial mass, midline shift or extra-axial fluid collection.No demarcated cortical infarction. Cerebral volume is normal for age. Vascular: No hyperdense vessel. Skull: Normal. Negative for fracture or focal lesion. Sinuses/Orbits: Visualized orbits demonstrate no acute abnormality. Mild ethmoid sinus mucosal thickening. No significant mastoid effusion. These results were called by telephone at the time of interpretation on 01/27/2020 at 9:36 pm to provider Dr. Laurence Slate, who verbally acknowledged these results. IMPRESSION: No CT evidence of acute intracranial abnormality. Electronically Signed   By: Jackey Loge DO   On: 01/27/2020 21:37     Assessment/Plan: Diagnosis: R MCA stroke  with L inattention, L hemiparesis, and impaired cognition 1. Does the need for close, 24 hr/day medical supervision in concert with the patient's rehab needs make it unreasonable for this patient to be served in a less intensive setting? Yes 2. Co-Morbidities requiring supervision/potential complications: tobacco 2 ppd; PVD, PAD, low back pain; HTN; sedated 3. Due to bladder management, bowel management, safety, skin/wound care, disease management, medication administration and patient education, does the patient require 24 hr/day rehab nursing? Yes 4. Does the patient require coordinated care of a physician, rehab nurse, therapy disciplines of PT, OT, SLP to address physical and functional deficits in the context of the above medical diagnosis(es)? Yes Addressing deficits in the following areas: balance, endurance, locomotion, strength, transferring, bathing, dressing, feeding, grooming, toileting, cognition, language and psychosocial support 5. Can the patient actively participate in an intensive therapy program of at least 3 hrs of therapy per day at least 5 days per week? Yes 6. The potential for patient to make measurable gains while on inpatient rehab is good 7. Anticipated functional outcomes upon discharge from inpatient rehab are modified independent and supervision  with PT, modified independent and supervision with OT, supervision with SLP. 8. Estimated rehab length of stay to reach the above functional goals is: 10-14 days 9. Anticipated discharge destination: Home 10. Overall Rehab/Functional Prognosis: good  RECOMMENDATIONS: This patient's condition is appropriate for continued rehabilitative care in the following setting: CIR Patient has agreed to participate in recommended program. Potentially Note that insurance prior authorization may be required for reimbursement for recommended care.  Comment:  1. Pt is an appropriate pt- his daughter notes he lives alone and her grandmother  cannot care for him physically, however could help financially- pt lives in 1st floor duplex in [place], [place].   2. At risk of developing spasticity since posturing/spasming with LUE already- will closely monitor on CIR.  3.  Please Set up room so pt has to look left to help overcome his L inattention- and encourage to use his LUE- has more strength than he realizes.   4. Pt said will stop smoking, but this needs to be strongly encouraged- smoked 2ppd.   Jacquelynn Cree, PA-C 01/28/2020   I have personally performed a face to face diagnostic evaluation of this patient and formulated the key components of the plan.  Additionally, I have personally reviewed laboratory data, imaging studies, as well as relevant notes and concur  with the physician assistant's documentation above.   The patient's status has not changed from the original consult.  Any changes in documentation from the acute care chart have been noted above.

## 2020-01-28 NOTE — Progress Notes (Signed)
  Echocardiogram 2D Echocardiogram has been performed.  Leta Jungling M 01/28/2020, 9:33 AM

## 2020-01-28 NOTE — Progress Notes (Signed)
STROKE TEAM PROGRESS NOTE   INTERVAL HISTORY Pt lying in bed, awake alert and orientated. 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 But now he is willing to quit. He denies heart palpitation or racing heart. He also refused TEE. Will do TCD bubble study.   Vitals:   01/28/20 0045 01/28/20 0246 01/28/20 0500 01/28/20 0741  BP: (!) 147/112 (!) 148/84 (!) 147/91 (!) 135/95  Pulse: 68 64 69 83  Resp: 20 17 16 18   Temp:  (!) 97.5 F (36.4 C) 98.6 F (37 C) 98.3 F (36.8 C)  TempSrc:  Oral Oral Oral  SpO2: 95% 96% 95% 94%  Weight:      Height:        CBC:  Recent Labs  Lab 01/27/20 2126 01/27/20 2133  WBC 11.1*  --   NEUTROABS 8.3*  --   HGB 17.3* 18.4*  HCT 54.7* 54.0*  MCV 94.6  --   PLT 223  --     Basic Metabolic Panel:  Recent Labs  Lab 01/27/20 2126 01/27/20 2133  NA 140 140  K 4.3 3.9  CL 106 106  CO2 22  --   GLUCOSE 134* 131*  BUN 9 11  CREATININE 1.03 1.00  CALCIUM 9.4  --    Lipid Panel:     Component Value Date/Time   CHOL 206 (H) 01/28/2020 0612   TRIG 154 (H) 01/28/2020 0612   HDL 31 (L) 01/28/2020 0612   CHOLHDL 6.6 01/28/2020 0612   VLDL 31 01/28/2020 0612   LDLCALC 144 (H) 01/28/2020 0612   HgbA1c:  Lab Results  Component Value Date   HGBA1C 5.7 (H) 01/28/2020   Urine Drug Screen: No results found for: LABOPIA, COCAINSCRNUR, LABBENZ, AMPHETMU, THCU, LABBARB  Alcohol Level No results found for: ETH  IMAGING past 24 hours CT ANGIO HEAD W OR WO CONTRAST  Result Date: 01/27/2020 CLINICAL DATA:  Stroke, follow-up. Additional history provided: Facial droop, left arm weakness. EXAM: CT ANGIOGRAPHY HEAD AND NECK TECHNIQUE: Multidetector CT imaging of the head and neck was performed using the standard protocol during bolus administration of intravenous contrast. Multiplanar CT image reconstructions and MIPs were obtained to evaluate the vascular anatomy. Carotid stenosis  measurements (when applicable) are obtained utilizing NASCET criteria, using the distal internal carotid diameter as the denominator. CONTRAST:  12mL OMNIPAQUE IOHEXOL 350 MG/ML SOLN COMPARISON:  Noncontrast head CT performed earlier the same day 01/27/2020. FINDINGS: CTA NECK FINDINGS Aortic arch: Standard aortic branching. The visualized aortic arch is unremarkable. No significant innominate or proximal subclavian artery stenosis. Right carotid system: CCA and ICA patent within the neck without stenosis. Tortuosity of the cervical ICA. Left carotid system: CCA and ICA patent within the neck without stenosis. Mild mixed plaque within the proximal ICA. Vertebral arteries: Left vertebral artery dominant. The vertebral arteries are patent within the neck bilaterally without significant stenosis. Skeleton: No acute bony abnormality or aggressive osseous lesion. Cervical spondylosis. Most notably there is a C5-C6 disc bulge with associated endplate spurring and osteophyte ridge. Bilateral neural foraminal narrowing at least mild bony spinal canal stenosis at this level. Other neck: No neck mass or cervical lymphadenopathy. Upper chest: Centrilobular emphysema. Review of the MIP images confirms the above findings CTA HEAD FINDINGS Anterior circulation: The intracranial internal carotid arteries are patent without significant stenosis. There is a moderate to moderately advanced stenosis within the distal M1 right MCA (series 11,  image 58) (series 6, image 289). No right M2 proximal branch occlusion is identified. The M1 left middle cerebral artery is patent without significant stenosis. No left M2 proximal branch occlusion is identified. The anterior cerebral arteries are patent without high-grade proximal stenosis. The A1 left ACA is dominant. There is an apparent high-grade stenosis within the A3-A4 left ACA (series 10, image 30). No intracranial aneurysm is identified. Posterior circulation: The intracranial internal  carotid arteries are patent. Mixed plaque results in sites of up to moderate stenosis within the intracranial right vertebral artery. No more than mild atherosclerotic narrowing of the intracranial left vertebral artery. The basilar artery is patent with mild atherosclerotic irregularity. The posterior cerebral arteries are patent bilaterally without significant proximal stenosis. A left posterior communicating artery is present. A right posterior communicating artery is not definitively identified. Venous sinuses: Within limitations of contrast timing, no convincing thrombus. Anatomic variants: As described. Review of the MIP images confirms the above findings These results were called by telephone at the time of interpretation on 01/27/2020 at 10:20 pm to provider Arther Dames , who verbally acknowledged these results. IMPRESSION: CTA neck: 1. The bilateral common carotid, internal carotid and vertebral arteries are patent within the neck without significant stenosis. Mild mixed plaque within the proximal left ICA. 2. Pulmonary emphysema. CTA head: 1. No intracranial large vessel occlusion. 2. Atherosclerotic disease with multifocal stenoses, most notably as follows. 3. Moderate to moderately advanced stenosis within the distal M1 right MCA. 4. Apparent high-grade focal stenosis within an A3-A4 left ACA branch vessel. 5. Sites of up to moderate atherosclerotic narrowing within the intracranial right vertebral artery. Electronically Signed   By: Jackey Loge DO   On: 01/27/2020 22:20   CT ANGIO NECK W OR WO CONTRAST  Result Date: 01/27/2020 CLINICAL DATA:  Stroke, follow-up. Additional history provided: Facial droop, left arm weakness. EXAM: CT ANGIOGRAPHY HEAD AND NECK TECHNIQUE: Multidetector CT imaging of the head and neck was performed using the standard protocol during bolus administration of intravenous contrast. Multiplanar CT image reconstructions and MIPs were obtained to evaluate the vascular  anatomy. Carotid stenosis measurements (when applicable) are obtained utilizing NASCET criteria, using the distal internal carotid diameter as the denominator. CONTRAST:  23mL OMNIPAQUE IOHEXOL 350 MG/ML SOLN COMPARISON:  Noncontrast head CT performed earlier the same day 01/27/2020. FINDINGS: CTA NECK FINDINGS Aortic arch: Standard aortic branching. The visualized aortic arch is unremarkable. No significant innominate or proximal subclavian artery stenosis. Right carotid system: CCA and ICA patent within the neck without stenosis. Tortuosity of the cervical ICA. Left carotid system: CCA and ICA patent within the neck without stenosis. Mild mixed plaque within the proximal ICA. Vertebral arteries: Left vertebral artery dominant. The vertebral arteries are patent within the neck bilaterally without significant stenosis. Skeleton: No acute bony abnormality or aggressive osseous lesion. Cervical spondylosis. Most notably there is a C5-C6 disc bulge with associated endplate spurring and osteophyte ridge. Bilateral neural foraminal narrowing at least mild bony spinal canal stenosis at this level. Other neck: No neck mass or cervical lymphadenopathy. Upper chest: Centrilobular emphysema. Review of the MIP images confirms the above findings CTA HEAD FINDINGS Anterior circulation: The intracranial internal carotid arteries are patent without significant stenosis. There is a moderate to moderately advanced stenosis within the distal M1 right MCA (series 11, image 58) (series 6, image 289). No right M2 proximal branch occlusion is identified. The M1 left middle cerebral artery is patent without significant stenosis. No left M2 proximal branch occlusion is  identified. The anterior cerebral arteries are patent without high-grade proximal stenosis. The A1 left ACA is dominant. There is an apparent high-grade stenosis within the A3-A4 left ACA (series 10, image 30). No intracranial aneurysm is identified. Posterior circulation:  The intracranial internal carotid arteries are patent. Mixed plaque results in sites of up to moderate stenosis within the intracranial right vertebral artery. No more than mild atherosclerotic narrowing of the intracranial left vertebral artery. The basilar artery is patent with mild atherosclerotic irregularity. The posterior cerebral arteries are patent bilaterally without significant proximal stenosis. A left posterior communicating artery is present. A right posterior communicating artery is not definitively identified. Venous sinuses: Within limitations of contrast timing, no convincing thrombus. Anatomic variants: As described. Review of the MIP images confirms the above findings These results were called by telephone at the time of interpretation on 01/27/2020 at 10:20 pm to provider Arther Dames , who verbally acknowledged these results. IMPRESSION: CTA neck: 1. The bilateral common carotid, internal carotid and vertebral arteries are patent within the neck without significant stenosis. Mild mixed plaque within the proximal left ICA. 2. Pulmonary emphysema. CTA head: 1. No intracranial large vessel occlusion. 2. Atherosclerotic disease with multifocal stenoses, most notably as follows. 3. Moderate to moderately advanced stenosis within the distal M1 right MCA. 4. Apparent high-grade focal stenosis within an A3-A4 left ACA branch vessel. 5. Sites of up to moderate atherosclerotic narrowing within the intracranial right vertebral artery. Electronically Signed   By: Jackey Loge DO   On: 01/27/2020 22:20   MR BRAIN WO CONTRAST  Result Date: 01/28/2020 CLINICAL DATA:  Left-sided weakness EXAM: MRI HEAD WITHOUT CONTRAST TECHNIQUE: Multiplanar, multiecho pulse sequences of the brain and surrounding structures were obtained without intravenous contrast. Coronal T2-weighted imaging and susceptibility weighted imaging were not acquired. COMPARISON:  CTA head 01/27/2020 FINDINGS: Brain: Multifocal abnormal  diffusion restriction within the anterior right MCA territory, including along the right precentral gyrus and the right basal ganglia. No hemorrhage or mass effect. Multifocal white matter hyperintensity, most commonly due to chronic ischemic microangiopathy. Normal volume of CSF spaces. No chronic microhemorrhage. Normal midline structures. Vascular: Normal flow voids. Skull and upper cervical spine: Normal marrow signal. Sinuses/Orbits: Negative. Other: None. IMPRESSION: Multifocal acute/early subacute within the anterior right MCA territory. No hemorrhage or mass effect. Electronically Signed   By: Deatra Robinson M.D.   On: 01/28/2020 01:39   ECHOCARDIOGRAM COMPLETE  Result Date: 01/28/2020    ECHOCARDIOGRAM REPORT   Patient Name:   Cody Baird Date of Exam: 01/28/2020 Medical Rec #:  [id number]    Height:       72.0 in Accession #:    [id number]   Weight:       195.0 lb Date of Birth:  1960/11/16    BSA:          2.108 m Patient Age:    58 years     BP:           135/95 mmHg Patient Gender: M            HR:           83 bpm. Exam Location:  Inpatient Procedure: 2D Echo and Intracardiac Opacification Agent Indications:    TIA 435.9 / G45.9  History:        Patient has prior history of Echocardiogram examinations, most                 recent 06/29/2013. PAD.  Sonographer:  Leta Junglingiffany Cooper RDCS Referring Phys: [id number] [facility] Jerelyn CharlesL GARBA  Sonographer Comments: Technically difficult study due to poor echo windows. IMPRESSIONS  1. Left ventricular ejection fraction, by estimation, is 60 to 65%. The left ventricle has normal function. The left ventricle has no regional wall motion abnormalities. Left ventricular diastolic function could not be evaluated.  2. Right ventricular systolic function was not well visualized. The right ventricular size is not well visualized.  3. The mitral valve is grossly normal. No evidence of mitral valve regurgitation.  4. The aortic valve was not well visualized. Aortic valve regurgitation  is not visualized.  5. Very limited echo, however, LV function appears normal. FINDINGS  Left Ventricle: Left ventricular ejection fraction, by estimation, is 60 to 65%. The left ventricle has normal function. The left ventricle has no regional wall motion abnormalities. Definity contrast agent was given IV to delineate the left ventricular  endocardial borders. The left ventricular internal cavity size was normal in size. There is no left ventricular hypertrophy. Left ventricular diastolic function could not be evaluated. Right Ventricle: The right ventricular size is not well visualized. Right vetricular wall thickness was not assessed. Right ventricular systolic function was not well visualized. Left Atrium: Left atrial size was normal in size. Right Atrium: Right atrial size was normal in size. Pericardium: There is no evidence of pericardial effusion. Mitral Valve: The mitral valve is grossly normal. No evidence of mitral valve regurgitation. Tricuspid Valve: The tricuspid valve is not well visualized. Tricuspid valve regurgitation is not demonstrated. Aortic Valve: The aortic valve was not well visualized. Aortic valve regurgitation is not visualized. Pulmonic Valve: The pulmonic valve was not well visualized. Pulmonic valve regurgitation is not visualized. Aorta: The aortic root was not well visualized. Venous: The inferior vena cava was not well visualized. IAS/Shunts: The interatrial septum was not well visualized.  LEFT VENTRICLE PLAX 2D LVIDd:         5.60 cm  Diastology LVIDs:         3.30 cm  LV e' lateral:   8.27 cm/s LV PW:         1.10 cm  LV E/e' lateral: 6.9 LV IVS:        1.10 cm  LV e' medial:    7.94 cm/s LVOT diam:     2.10 cm  LV E/e' medial:  7.2 LVOT Area:     3.46 cm  MITRAL VALVE MV Area (PHT): 5.02 cm    SHUNTS MV Decel Time: 151 msec    Systemic Diam: 2.10 cm MV E velocity: 56.80 cm/s MV A velocity: 39.90 cm/s MV E/A ratio:  1.42 Zoila ShutterKenneth Hilty MD Electronically signed by Zoila ShutterKenneth Hilty  MD Signature Date/Time: 01/28/2020/11:11:55 AM    Final    CT HEAD CODE STROKE WO CONTRAST  Result Date: 01/27/2020 CLINICAL DATA:  Code stroke. Facial droop; left arm weakness, last known well 2300 yesterday. EXAM: CT HEAD WITHOUT CONTRAST TECHNIQUE: Contiguous axial images were obtained from the base of the skull through the vertex without intravenous contrast. COMPARISON:  No pertinent prior studies available for comparison. FINDINGS: Brain: There is no evidence of acute intracranial hemorrhage, intracranial mass, midline shift or extra-axial fluid collection.No demarcated cortical infarction. Cerebral volume is normal for age. Vascular: No hyperdense vessel. Skull: Normal. Negative for fracture or focal lesion. Sinuses/Orbits: Visualized orbits demonstrate no acute abnormality. Mild ethmoid sinus mucosal thickening. No significant mastoid effusion. These results were called by telephone at the time of interpretation on 01/27/2020 at 9:36 pm  to provider Dr. Laurence Slate, who verbally acknowledged these results. IMPRESSION: No CT evidence of acute intracranial abnormality. Electronically Signed   By: Jackey Loge DO   On: 01/27/2020 21:37   VAS [place] LOWER EXTREMITY VENOUS (DVT)  Result Date: 01/28/2020  Lower Venous DVTStudy Indications: Stroke.  Comparison Study: No prior exam. Performing Technologist: Kennedy Bucker ARDMS, RVT  Examination Guidelines: A complete evaluation includes B-mode imaging, spectral Doppler, color Doppler, and power Doppler as needed of all accessible portions of each vessel. Bilateral testing is considered an integral part of a complete examination. Limited examinations for reoccurring indications may be performed as noted. The reflux portion of the exam is performed with the patient in reverse Trendelenburg.  +---------+---------------+---------+-----------+----------+--------------+ RIGHT    CompressibilityPhasicitySpontaneityPropertiesThrombus Aging  +---------+---------------+---------+-----------+----------+--------------+ CFV      Full           Yes      Yes                                 +---------+---------------+---------+-----------+----------+--------------+ SFJ      Full                                                        +---------+---------------+---------+-----------+----------+--------------+ FV Prox  Full                                                        +---------+---------------+---------+-----------+----------+--------------+ FV Mid   Full                                                        +---------+---------------+---------+-----------+----------+--------------+ FV DistalFull                                                        +---------+---------------+---------+-----------+----------+--------------+ PFV      Full                                                        +---------+---------------+---------+-----------+----------+--------------+ POP      Full           Yes      Yes                                 +---------+---------------+---------+-----------+----------+--------------+ PTV      Full                                                        +---------+---------------+---------+-----------+----------+--------------+  PERO     Full                                                        +---------+---------------+---------+-----------+----------+--------------+   +---------+---------------+---------+-----------+----------+--------------+ LEFT     CompressibilityPhasicitySpontaneityPropertiesThrombus Aging +---------+---------------+---------+-----------+----------+--------------+ CFV      Full           Yes      Yes                                 +---------+---------------+---------+-----------+----------+--------------+ SFJ      Full                                                         +---------+---------------+---------+-----------+----------+--------------+ FV Prox  Full                                                        +---------+---------------+---------+-----------+----------+--------------+ FV Mid   Full                                                        +---------+---------------+---------+-----------+----------+--------------+ FV DistalFull                                                        +---------+---------------+---------+-----------+----------+--------------+ PFV      Full                                                        +---------+---------------+---------+-----------+----------+--------------+ POP      Full           Yes      Yes                                 +---------+---------------+---------+-----------+----------+--------------+ PTV      Full                                                        +---------+---------------+---------+-----------+----------+--------------+ PERO     Full                                                        +---------+---------------+---------+-----------+----------+--------------+  Summary: BILATERAL: - No evidence of deep vein thrombosis seen in the lower extremities, bilaterally.  RIGHT: - No cystic structure found in the popliteal fossa.  LEFT: - No cystic structure found in the popliteal fossa.  *See table(s) above for measurements and observations.    Preliminary     PHYSICAL EXAM  Temp:  [97.5 F (36.4 C)-99 F (37.2 C)] 99 F (37.2 C) (03/16 1733) Pulse Rate:  [61-101] 71 (03/16 1733) Resp:  [16-22] 18 (03/16 1733) BP: (122-156)/(72-112) 122/90 (03/16 1733) SpO2:  [91 %-100 %] 96 % (03/16 1733) Weight:  [88.5 kg] 88.5 kg (03/15 2206)  General - Well nourished, well developed, in no apparent distress.  Ophthalmologic - fundi not visualized due to noncooperation.  Cardiovascular - Regular rhythm and rate.  Mental Status -  Level of arousal and  orientation to time, place, and person were intact. Language including expression, naming, repetition, comprehension was assessed and found intact. Fund of Knowledge was assessed and was intact.  Cranial Nerves II - XII - II - Visual field intact OU. III, IV, VI - Extraocular movements intact. V - Facial sensation intact bilaterally. VII - mild left facial droop. VIII - Hearing & vestibular intact bilaterally. X - Palate elevates symmetrically. XI - Chin turning & shoulder shrug intact bilaterally. XII - Tongue protrusion intact.  Motor Strength - The patient's strength was normal in RUE and RLE. However, LUE 3+/5 proximal and distal 3-/5, LLE 3+/5.  Bulk was normal and fasciculations were absent.   Motor Tone - Muscle tone was assessed at the neck and appendages and was normal.  Reflexes - The patient's reflexes were symmetrical in all extremities and he had no pathological reflexes.  Sensory - Light touch, temperature/pinprick were assessed and were symmetrical.    Coordination - The patient had normal movements in the right hand with no ataxia or dysmetria. Left FTN ataxia but not out of proportion of the weakness. Tremor was absent.  Gait and Station - deferred.   ASSESSMENT/PLAN Cody Baird is a 59 y.o. male with history of PVD s/p fem pop with multiple toe amputations, heavy smoker and temporary colostomy for prolapsed rectum presenting with 1-day hx L sided weakness and slurred speech.   Stroke: scattered R MCA territory infarct, embolic pattern, etiology uncertain, concerning for large vessel source  Code Stroke CT head No acute abnormality.   CTA head no LVO. IC atherosclerosis: moderate distal R M1, high-grade L A3-4, R VA stenosis  CTA neck mixed plaque proximal L ICA. pulm emphysema  MRI  Multifocal R MCA territory infarcts   LE Doppler  No DVT  2D Echo EF 60-65%. No source of embolus   Pt refused TEE  TCD bubble study pending  Will consider 30 day  cardiac event monitoring if work up neg   Hypercoagulable labs pending   UDS positive for THC  LDL 144  HgbA1c 5.7  Lovenox 40 mg sq daily for VTE prophylaxis  No antithrombotic prior to admission, now on aspirin 325 mg daily and clopidogrel 75 mg daily. Given high-grade intracranial stenosis, continue DAPT x 3 months then aspirin alone.  Therapy recommendations:  CIR   Disposition:  pending   PVD   s/p fem pop, and multiple toe amputations  Supporting the current stroke likely large vessel disease  On DAPT now  Tobacco abuse  Current heavy smoker  Smoking cessation counseling provided  Pt is willing to quit  polycythemia since 2015, current Hb 18.4  Hypertension  Home meds:  None, no hx HTN  Stable 140s . Permissive hypertension (OK if < 220/120) but gradually normalize in 5-7 days . Long-term BP goal normotensive  Hyperlipidemia  Home meds:  No statin  Now on lipitor 40  LDL 144, goal < 70  Continue statin at discharge  Other Stroke Risk Factors    Other Active Problems  Polycythemia, likely d/t smoking  Hospital day # 0  Rosalin Hawking, MD PhD Stroke Neurology 01/28/2020 7:17 PM    To contact Stroke Continuity provider, please refer to [email or web address] After hours, contact General Neurology

## 2020-01-28 NOTE — Evaluation (Signed)
Physical Therapy Evaluation Patient Details Name: Cody Baird MRN: [id number] DOB: 1961-08-22 Today's Date: 01/28/2020   History of Present Illness  Cody Baird is a 59 y.o. male with peripheral artery disease who does not seek medical care presents to the emergency department for approximately 1 day history of left-sided weakness.  PMH positive for 2 back surgeries, PVD, tobacco use, R LE femoral endarterectomy/thrombectomy/bypass and 3 toes ampuated.  Clinical Impression  Patient presents with decreased mobility due to L side weakness, inattention, decreased balance and decreased deficit awareness.  Currently min to mod A for short distance ambulation in the room with difficulty maintaining sitting balance due to inattention and L side weakness.  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  States he does have someone who can stay with him at d/c.  Recommend CIR level rehab prior to d/c home for maximized safety, independence and cognition.      Follow Up Recommendations CIR    Equipment Recommendations  Rolling walker with 5" wheels    Recommendations for Other Services Rehab consult     Precautions / Restrictions Precautions Precautions: Fall Precaution Comments: L side weakness      Mobility  Bed Mobility Overal bed mobility: Needs Assistance Bed Mobility: Supine to Sit     Supine to sit: HOB elevated;Min assist     General bed mobility comments: cues for technique, scooting to EOB, assist for stability due to L lateral lean  Transfers Overall transfer level: Needs assistance Equipment used: Rolling walker (2 wheeled) Transfers: Sit to/from Stand Sit to Stand: From elevated surface;Mod assist         General transfer comment: some assist for balance/lifting  Ambulation/Gait Ambulation/Gait assistance: Mod assist Gait Distance (Feet): 25 Feet Assistive device: Rolling walker (2 wheeled) Gait Pattern/deviations: Step-to pattern;Step-through  pattern;Decreased stride length;Decreased dorsiflexion - left;Wide base of support     General Gait Details: assist to keep L hand on walker, assist for walker management and balance due to L lateral lean  Stairs            Wheelchair Mobility    Modified Rankin (Stroke Patients Only) Modified Rankin (Stroke Patients Only) Pre-Morbid Rankin Score: No symptoms Modified Rankin: Moderately severe disability     Balance Overall balance assessment: Needs assistance Sitting-balance support: Feet supported Sitting balance-Leahy Scale: Poor Sitting balance - Comments: L lateral lean min A for balance Postural control: Left lateral lean Standing balance support: Bilateral upper extremity supported Standing balance-Leahy Scale: Poor Standing balance comment: min to mod A for balance with UE support                             Pertinent Vitals/Pain Pain Assessment: No/denies pain    Home Living Family/patient expects to be discharged to:: Private residence Living Arrangements: Alone Available Help at Discharge: Friend(s);Available PRN/intermittently Type of Home: Apartment Home Access: Stairs to enter Entrance Stairs-Rails: Right Entrance Stairs-Number of Steps: 2-3 Home Layout: One level Home Equipment: None      Prior Function Level of Independence: Independent         Comments: works Immunologist        Extremity/Trunk Assessment   Upper Extremity Assessment Upper Extremity Assessment: LUE deficits/detail LUE Deficits / Details: AAROM WFL, strength shoulder flexion 3-/5, elbow extension/flexion 3-/5, grip minimal LUE Sensation: decreased light touch    Lower Extremity Assessment Lower Extremity Assessment: LLE deficits/detail;RLE deficits/detail RLE  Deficits / Details: AROM/strength WFL, numbness/burning/tingling in foot h/o neuropathy RLE Sensation: decreased light touch;history of peripheral neuropathy LLE Deficits  / Details: AROM WFL, strength hip flexion 4-/5, knee extension 4/5, ankle DF 2/5    Cervical / Trunk Assessment Cervical / Trunk Assessment: Other exceptions Cervical / Trunk Exceptions: elongation of L trunk and L lateral trunk lean  Communication   Communication: No difficulties  Cognition Arousal/Alertness: Awake/alert Behavior During Therapy: WFL for tasks assessed/performed Overall Cognitive Status: Impaired/Different from baseline Area of Impairment: Attention;Safety/judgement;Following commands                   Current Attention Level: Sustained   Following Commands: Follows one step commands consistently;Follows one step commands with increased time Safety/Judgement: Decreased awareness of safety;Decreased awareness of deficits            General Comments General comments (skin integrity, edema, etc.): R head turn in sitting with decreased L side awareness    Exercises     Assessment/Plan    PT Assessment Patient needs continued PT services  PT Problem List Decreased strength;Decreased activity tolerance;Decreased mobility;Decreased cognition;Decreased safety awareness;Decreased knowledge of use of DME;Decreased balance       PT Treatment Interventions DME instruction;Stair training;Therapeutic activities;Balance training;Patient/family education;Therapeutic exercise;Functional mobility training;Gait training;Neuromuscular re-education;Cognitive remediation    PT Goals (Current goals can be found in the Care Plan section)  Acute Rehab PT Goals Patient Stated Goal: to return to independent PT Goal Formulation: With patient Time For Goal Achievement: 02/11/20 Potential to Achieve Goals: Good    Frequency Min 4X/week   Barriers to discharge Decreased caregiver support      Co-evaluation               AM-PAC PT "6 Clicks" Mobility  Outcome Measure Help needed turning from your back to your side while in a flat bed without using bedrails?: A  Little Help needed moving from lying on your back to sitting on the side of a flat bed without using bedrails?: A Little Help needed moving to and from a bed to a chair (including a wheelchair)?: A Lot Help needed standing up from a chair using your arms (e.g., wheelchair or bedside chair)?: A Lot Help needed to walk in hospital room?: A Lot Help needed climbing 3-5 steps with a railing? : Total 6 Click Score: 13    End of Session   Activity Tolerance: Patient limited by fatigue Patient left: in bed;with bed alarm set   PT Visit Diagnosis: Other abnormalities of gait and mobility (R26.89);Other symptoms and signs involving the nervous system (R29.898);Hemiplegia and hemiparesis Hemiplegia - Right/Left: Left Hemiplegia - dominant/non-dominant: Non-dominant Hemiplegia - caused by: Cerebral infarction    Time: 2585-2778 PT Time Calculation (min) (ACUTE ONLY): 26 min   Charges:   PT Evaluation $PT Eval Moderate Complexity: 1 Mod PT Treatments $Gait Training: 8-22 mins        Sheran Lawless, [facility] [phone] 01/28/2020   Elray Mcgregor 01/28/2020, 10:33 AM

## 2020-01-28 NOTE — Progress Notes (Signed)
Rehab Admissions Coordinator Note:  Patient was screened by Clois Dupes for appropriateness for an Inpatient Acute Rehab Consult per therapy recs.   At this time, we are recommending Inpatient Rehab consult. I will place order per protocol.  Clois Dupes RN MSN 01/28/2020, 12:01 PM  I can be reached at [phone].

## 2020-01-28 NOTE — ED Notes (Signed)
Provider bedside updating pt and family.  RN answered all other questions.  MRI called, on the way to get pt.  Pt states he should not have problems in the MRI  Daughter leaving, Armstead Heiland - [phone].

## 2020-01-29 ENCOUNTER — Other Ambulatory Visit (HOSPITAL_COMMUNITY): Payer: Self-pay

## 2020-01-29 ENCOUNTER — Inpatient Hospital Stay (HOSPITAL_COMMUNITY): Payer: Self-pay

## 2020-01-29 DIAGNOSIS — D751 Secondary polycythemia: Secondary | ICD-10-CM

## 2020-01-29 DIAGNOSIS — I63411 Cerebral infarction due to embolism of right middle cerebral artery: Secondary | ICD-10-CM

## 2020-01-29 DIAGNOSIS — R001 Bradycardia, unspecified: Secondary | ICD-10-CM | POA: Diagnosis present

## 2020-01-29 DIAGNOSIS — E538 Deficiency of other specified B group vitamins: Secondary | ICD-10-CM

## 2020-01-29 LAB — CARDIOLIPIN ANTIBODIES, IGG, IGM, IGA
Anticardiolipin IgA: <9 APL U/mL (ref 0–11)
Anticardiolipin IgG: <9 GPL U/mL (ref 0–14)
Anticardiolipin IgM: <9 MPL U/mL (ref 0–12)

## 2020-01-29 LAB — BETA-2-GLYCOPROTEIN I ABS, IGG/M/A
Beta-2 Glyco I IgG: <9 GPI IgG units (ref 0–20)
Beta-2-Glycoprotein I IgA: <9 GPI IgA units (ref 0–25)
Beta-2-Glycoprotein I IgM: <9 GPI IgM units (ref 0–32)

## 2020-01-29 LAB — LUPUS ANTICOAGULANT PANEL
DRVVT: 39.9 s (ref 0.0–47.0)
PTT Lupus Anticoagulant: 35.5 s (ref 0.0–51.9)

## 2020-01-29 LAB — RPR: RPR Ser Ql: NONREACTIVE

## 2020-01-29 LAB — HOMOCYSTEINE: Homocysteine: 13 umol/L (ref 0.0–14.5)

## 2020-01-29 LAB — ANTINUCLEAR ANTIBODIES, IFA: ANA Ab, IFA: NEGATIVE

## 2020-01-29 MED ORDER — CYANOCOBALAMIN 1000 MCG/ML IJ SOLN
1000.0000 ug | Freq: Once | INTRAMUSCULAR | Status: AC
  Administered 2020-01-29: 1000 ug via INTRAMUSCULAR
  Filled 2020-01-29: qty 1

## 2020-01-29 MED ORDER — LORAZEPAM 0.5 MG PO TABS
0.5000 mg | ORAL_TABLET | Freq: Once | ORAL | Status: AC
  Administered 2020-01-29: 0.5 mg via ORAL
  Filled 2020-01-29: qty 1

## 2020-01-29 MED ORDER — VITAMIN B-12 1000 MCG PO TABS
1000.0000 ug | ORAL_TABLET | Freq: Every day | ORAL | Status: DC
  Administered 2020-01-30: 1000 ug via ORAL
  Filled 2020-01-29: qty 1

## 2020-01-29 NOTE — Progress Notes (Signed)
STROKE TEAM PROGRESS NOTE   INTERVAL HISTORY Pt lying in bed comfortably. Left sided weakness continues to improve. Had TCD bubble study at bedside. No PFO but possible pulmonary small shunt. Pt educated again on smoking cessation and risk factor modification.   Vitals:   01/28/20 1733 01/28/20 2244 01/29/20 0441 01/29/20 0701  BP: 122/90 (!) 140/98 (!) 142/117 (!) 146/88  Pulse: 71 68 63 (!) 58  Resp: 18 18 17 18   Temp: 99 F (37.2 C) 99 F (37.2 C) 98.7 F (37.1 C) 98.3 F (36.8 C)  TempSrc: Oral Oral Oral Oral  SpO2: 96% 98% 94% 94%  Weight:      Height:        CBC:  Recent Labs  Lab 01/27/20 2126 01/27/20 2133  WBC 11.1*  --   NEUTROABS 8.3*  --   HGB 17.3* 18.4*  HCT 54.7* 54.0*  MCV 94.6  --   PLT 223  --     Basic Metabolic Panel:  Recent Labs  Lab 01/27/20 2126 01/27/20 2133  NA 140 140  K 4.3 3.9  CL 106 106  CO2 22  --   GLUCOSE 134* 131*  BUN 9 11  CREATININE 1.03 1.00  CALCIUM 9.4  --    Lipid Panel:     Component Value Date/Time   CHOL 206 (H) 01/28/2020 0612   TRIG 154 (H) 01/28/2020 0612   HDL 31 (L) 01/28/2020 0612   CHOLHDL 6.6 01/28/2020 0612   VLDL 31 01/28/2020 0612   LDLCALC 144 (H) 01/28/2020 0612   HgbA1c:  Lab Results  Component Value Date   HGBA1C 5.7 (H) 01/28/2020   Urine Drug Screen:     Component Value Date/Time   LABOPIA NONE DETECTED 01/28/2020 1540   COCAINSCRNUR NONE DETECTED 01/28/2020 1540   LABBENZ NONE DETECTED 01/28/2020 1540   AMPHETMU NONE DETECTED 01/28/2020 1540   THCU POSITIVE (A) 01/28/2020 1540   LABBARB NONE DETECTED 01/28/2020 1540    Alcohol Level No results found for: ETH  IMAGING past 24 hours VAS 01/30/2020 LOWER EXTREMITY VENOUS (DVT)  Result Date: 01/28/2020  Lower Venous DVTStudy Indications: Stroke.  Comparison Study: No prior exam. Performing Technologist: 01/30/2020 ARDMS, RVT  Examination Guidelines: A complete evaluation includes B-mode imaging, spectral Doppler, color Doppler, and  power Doppler as needed of all accessible portions of each vessel. Bilateral testing is considered an integral part of a complete examination. Limited examinations for reoccurring indications may be performed as noted. The reflux portion of the exam is performed with the patient in reverse Trendelenburg.  +---------+---------------+---------+-----------+----------+--------------+ RIGHT    CompressibilityPhasicitySpontaneityPropertiesThrombus Aging +---------+---------------+---------+-----------+----------+--------------+ CFV      Full           Yes      Yes                                 +---------+---------------+---------+-----------+----------+--------------+ SFJ      Full                                                        +---------+---------------+---------+-----------+----------+--------------+ FV Prox  Full                                                        +---------+---------------+---------+-----------+----------+--------------+  FV Mid   Full                                                        +---------+---------------+---------+-----------+----------+--------------+ FV DistalFull                                                        +---------+---------------+---------+-----------+----------+--------------+ PFV      Full                                                        +---------+---------------+---------+-----------+----------+--------------+ POP      Full           Yes      Yes                                 +---------+---------------+---------+-----------+----------+--------------+ PTV      Full                                                        +---------+---------------+---------+-----------+----------+--------------+ PERO     Full                                                        +---------+---------------+---------+-----------+----------+--------------+    +---------+---------------+---------+-----------+----------+--------------+ LEFT     CompressibilityPhasicitySpontaneityPropertiesThrombus Aging +---------+---------------+---------+-----------+----------+--------------+ CFV      Full           Yes      Yes                                 +---------+---------------+---------+-----------+----------+--------------+ SFJ      Full                                                        +---------+---------------+---------+-----------+----------+--------------+ FV Prox  Full                                                        +---------+---------------+---------+-----------+----------+--------------+ FV Mid   Full                                                        +---------+---------------+---------+-----------+----------+--------------+   FV DistalFull                                                        +---------+---------------+---------+-----------+----------+--------------+ PFV      Full                                                        +---------+---------------+---------+-----------+----------+--------------+ POP      Full           Yes      Yes                                 +---------+---------------+---------+-----------+----------+--------------+ PTV      Full                                                        +---------+---------------+---------+-----------+----------+--------------+ PERO     Full                                                        +---------+---------------+---------+-----------+----------+--------------+     Summary: BILATERAL: - No evidence of deep vein thrombosis seen in the lower extremities, bilaterally.  RIGHT: - No cystic structure found in the popliteal fossa.  LEFT: - No cystic structure found in the popliteal fossa.  *See table(s) above for measurements and observations. Electronically signed by Harold Barban MD on 01/28/2020 at 9:42:45 PM.     Final     PHYSICAL EXAM    Temp:  [97.9 F (36.6 C)-99 F (37.2 C)] 98.3 F (36.8 C) (03/17 0701) Pulse Rate:  [58-71] 58 (03/17 0701) Resp:  [17-18] 18 (03/17 0701) BP: (122-146)/(72-117) 146/88 (03/17 0701) SpO2:  [94 %-98 %] 94 % (03/17 0701)  General - Well nourished, well developed, in no apparent distress.  Ophthalmologic - fundi not visualized due to noncooperation.  Cardiovascular - Regular rhythm and rate.  Mental Status -  Level of arousal and orientation to time, place, and person were intact. Language including expression, naming, repetition, comprehension was assessed and found intact. Fund of Knowledge was assessed and was intact.  Cranial Nerves II - XII - II - Visual field intact OU. III, IV, VI - Extraocular movements intact. V - Facial sensation intact bilaterally. VII - mild left facial droop. VIII - Hearing & vestibular intact bilaterally. X - Palate elevates symmetrically. XI - Chin turning & shoulder shrug intact bilaterally. XII - Tongue protrusion intact.  Motor Strength - The patient's strength was normal in RUE and RLE. However, LUE 4/5 proximal and distal 3/5, LLE 4/5.  Bulk was normal and fasciculations were absent.   Motor Tone - Muscle tone was assessed at the neck and appendages and was normal.  Reflexes - The patient's reflexes were symmetrical in all extremities and he  had no pathological reflexes.  Sensory - Light touch, temperature/pinprick were assessed and were symmetrical.    Coordination - The patient had normal movements in the right hand with no ataxia or dysmetria. Left FTN ataxia but not out of proportion of the weakness. Tremor was absent.  Gait and Station - deferred.   ASSESSMENT/PLAN [name] is a 59 y.o. male with history of PVD s/p fem pop with multiple toe amputations, heavy smoker and temporary colostomy for prolapsed rectum presenting with 1-day hx L sided weakness and slurred speech.   Stroke: scattered R  MCA territory infarct, embolic pattern, etiology uncertain, concerning for large vessel source  Code Stroke CT head No acute abnormality.   CTA head no LVO. IC atherosclerosis: moderate distal R M1, high-grade L A3-4, R VA stenosis  CTA neck mixed plaque proximal L ICA. pulm emphysema  MRI  Multifocal R MCA territory infarcts   LE Doppler  No DVT  2D Echo EF 60-65%. No source of embolus   Pt refused TEE  TCD bubble study negative for PFO, but possible insignificant small pulmonary shunt  Will consider 30 day cardiac event monitoring as outpt to rule out afib   Hypercoagulable labs neg   UDS positive for THC  LDL 144  HgbA1c 5.7  Lovenox 40 mg sq daily for VTE prophylaxis  No antithrombotic prior to admission, now on aspirin 325 mg daily and clopidogrel 75 mg daily. Given high-grade intracranial stenosis, continue DAPT x 3 months then aspirin alone.  Therapy recommendations:  CIR   Disposition:  pending   PVD   s/p fem pop, and multiple toe amputations  Supporting the current stroke likely large vessel disease  On DAPT now  Tobacco abuse  Current heavy smoker  Smoking cessation counseling provided  Pt is willing to quit  polycythemia since 2015, current Hb 18.4  B12 deficiency  B12 = 156  Supplement ordered  Homocysteine 13  Hypertension  Home meds:  None, no hx HTN  Stable 140s . Permissive hypertension (OK if < 220/120) but gradually normalize in 5-7 days . Long-term BP goal normotensive  Hyperlipidemia  Home meds:  No statin  Now on lipitor 40  LDL 144, goal < 70  Continue statin at discharge  Other Stroke Risk Factors    Other Active Problems  Polycythemia, likely d/t smoking  Sinus brady in 30s w/ 3.5 sec pause - 30 day outpt heart monitoring  Hospital day # 1  Neurology will sign off. Please call with questions. Pt will follow up with stroke clinic NP at [facility] in about 4 weeks. Thanks for the consult.   Marvel Plan, MD  PhD Stroke Neurology 01/29/2020 12:15 PM    To contact Stroke Continuity provider, please refer to [email or web address]. After hours, contact General Neurology

## 2020-01-29 NOTE — Progress Notes (Signed)
Progress Note    Cody Baird  ZOX:[id number] DOB: [birth year]  DOA: 01/27/2020 PCP: Patient, No Pcp Per    Brief Narrative:   Chief complaint: Left-sided weakness  Medical records reviewed and are as summarized below:  Cody Baird is an 59 y.o. male with a past medical history that includes PVD status post femoropopliteal past, heavy tobacco use, hypertension presented to the emergency department March 15 chief complaint left-sided weakness.  Work-up in the emergency department included an MRI revealing multifocal acute/early subacute anterior right MCA.  Stroke work-up being completed recommendations were for CIR.  Currently awaiting evaluation/authorization for CIR.  Telemetry also notes early this morning at 3.5-second sinus pause and bradycardia with a heart rate in the 30s nonsustained.  Assessment/Plan:   Principal Problem:   Stroke [facility]) Active Problems:   Essential hypertension   PVD (peripheral vascular disease) (HCC)   Tobacco abuse   Bradycardia  #1.  Stroke.  Patient with left-sided weakness and slurred speech.  CT of the head did not show acute findings and CT angiogram was negative for large vessel occlusion.  Patient was outside the window for IV TPA.  MRI reveals multifocal acute/early subacute anterior right MCA infarct. evaluated by neurology who opine scattered right MCA territory infarct, embolic pattern, etiology uncertain, concerning for large vessel source.  Echocardiogram with an ejection fraction of 60 to 65%, lipid panel with cholesterol 206, triglycerides 154, HDL 31, LDL 144.  Hemoglobin A1c 5.7.  Plavix and statin and aspirin started.  Evaluated by physical therapy/Occupational Therapy/speech therapy who all recommends CIR.  Evaluated by CIR and deemed a good candidate and now is awaiting authorization. -Stroke team recommendations include aspirin 325 mg and Plavix 75 mg daily.  This to be continued for 3 months then aspirin alone -May need 30-day cardiac  event monitoring depending on results of completed work-up per neurology note  #2.  Hypertension.  Blood pressure elevated on presentation and remained on the high end of normal.  Has been noncompliant with antihypertensives in the past. -Monitor -Recommendations per neuro  #3.  Peripheral vascular disease.  Stable at baseline.  Chart review indicates patient lost any follow-up.  Continues to smoke  #4.  Tobacco use.  Cessation counseling offered.  Nicotine patch declined  #5.  Bradycardia/sinus pause.  When patient sleeping heart rate dips.  When awake heart rate range 55-73.  Asymptomatic.  TSH within the limits of normal.  EKG with sinus rhythm no acute abnormalities.  Not on any rate controlling medications -Monitor -See #1    Family Communication/Anticipated D/C date and plan/Code Status   DVT prophylaxis: Lovenox ordered. Code Status: Full Code.  Family Communication: patient at bedside Disposition Plan: to be determined but he would benefit from CIR and is in process of being evaluated for that.    Medical Consultants:    Roda Shutters. neurology   Anti-Infectives:    None  Subjective:   Awake alert.  Denies pain or discomfort.  Continues to complain of "feeling foggy and tired".  Objective:    Vitals:   01/28/20 2244 01/29/20 0441 01/29/20 0701 01/29/20 1217  BP: (!) 140/98 (!) 142/117 (!) 146/88 (!) 145/83  Pulse: 68 63 (!) 58 73  Resp: Temp: 99 F (37.2 C) 98.7 F (37.1 C) 98.3 F (36.8 C) 98 F (36.7 C)  TempSrc: Oral Oral Oral Oral  SpO2: 98% 94% 94% 96%  Weight:      Height:  Intake/Output Summary (Last 24 hours) at 01/29/2020 1449 Last data filed at 01/29/2020 0700 Gross per 24 hour  Intake 2459.1 ml  Output 400 ml  Net 2059.1 ml   Filed Weights   01/27/20 2206  Weight: 88.5 kg    Exam General: Awake alert speech is slow but clear no acute distress CV: Regular rate and rhythm no murmur gallop or rub no lower extremity  edema Respiratory: No increased work of breathing breath sounds are distant with fair air movement I hear no wheeze no rhonchi Abdomen: Soft positive bowel sounds throughout nontender to palpation no mass organomegaly noted Musculoskeletal: Joints without swelling/erythema full range of motion on the right Neuro alert and oriented x3 speech is slow but clear gross motor on left arm and hand improved from yesterday  Data Reviewed:   I have personally reviewed following labs and imaging studies:  Labs: Labs show the following:   Basic Metabolic Panel: Recent Labs  Lab 01/27/20 2126 01/27/20 2133  NA 140 140  K 4.3 3.9  CL 106 106  CO2 22  --   GLUCOSE 134* 131*  BUN 9 11  CREATININE 1.03 1.00  CALCIUM 9.4  --    GFR Estimated Creatinine Clearance: 89.5 mL/min (by C-G formula based on SCr of 1 mg/dL). Liver Function Tests: Recent Labs  Lab 01/27/20 2126  AST 18  ALT 17  ALKPHOS 64  BILITOT 0.5  PROT 7.7  ALBUMIN 4.1   No results for input(s): LIPASE, AMYLASE in the last 168 hours. No results for input(s): AMMONIA in the last 168 hours. Coagulation profile Recent Labs  Lab 01/27/20 2126  INR 1.0    CBC: Recent Labs  Lab 01/27/20 2126 01/27/20 2133  WBC 11.1*  --   NEUTROABS 8.3*  --   HGB 17.3* 18.4*  HCT 54.7* 54.0*  MCV 94.6  --   PLT 223  --    Cardiac Enzymes: No results for input(s): CKTOTAL, CKMB, CKMBINDEX, TROPONINI in the last 168 hours. BNP (last 3 results) No results for input(s): PROBNP in the last 8760 hours. CBG: Recent Labs  Lab 01/27/20 2123  GLUCAP 128*   D-Dimer: No results for input(s): DDIMER in the last 72 hours. Hgb A1c: Recent Labs    01/28/20 0612  HGBA1C 5.7*   Lipid Profile: Recent Labs    01/28/20 0612  CHOL 206*  HDL 31*  LDLCALC 144*  TRIG 154*  CHOLHDL 6.6   Thyroid function studies: Recent Labs    01/28/20 1041  TSH 1.784   Anemia work up: Recent Labs    01/28/20 1041  VITAMINB12 156*    Sepsis Labs: Recent Labs  Lab 01/27/20 2126  WBC 11.1*    Microbiology Recent Results (from the past 240 hour(s))  SARS Coronavirus 2 by RT PCR     Status: None   Collection Time: 01/28/20  2:18 AM  Result Value Ref Range Status   SARS Coronavirus 2 NEGATIVE NEGATIVE Final    Comment: (NOTE) Result indicates the ABSENCE of SARS-CoV-2 RNA in the patient specimen.  The lowest concentration of SARS-CoV-2 viral copies this assay can detect in nasopharyngeal swab specimens is 500 copies / mL.  A negative result does not preclude SARS-CoV-2 infection and should not be used as the sole basis for patient management decisions. A negative result may occur with improper specimen collection / handling, submission of a specimen other than nasopharyngeal swab, presence of viral mutation(s) within the areas targeted by this assay, and inadequate  number of viral copies (<500 copies / mL) present.  Negative results must be combined with clinical observations, patient history, and epidemiological information.  The expected result is NEGATIVE.  Patient Fact Sheet:  https://wong-henderson.biz/   Provider Fact Sheet:  CheapJackpot.at   This test is not yet approved or cleared by the [place] FDA and  has been authorized for  detection and/or diagnosis of SARS-CoV-2 by FDA under an Emergency Use Authorization (EUA).  This EUA will remain in effect (meaning this test can be used) for the duration of  the COVID-19 declaration under Section 564(b)(1) of the Act, 21 U.S.C. section 360bbb-3(b)(1), unless the authorization is terminated or revoked sooner Performed at [facility], [address] [place], [place] [address]     Procedures and diagnostic studies:  CT ANGIO HEAD W OR WO CONTRAST  Result Date: 01/27/2020 CLINICAL DATA:  Stroke, follow-up. Additional history provided: Facial droop, left arm weakness. EXAM: CT ANGIOGRAPHY HEAD AND  NECK TECHNIQUE: Multidetector CT imaging of the head and neck was performed using the standard protocol during bolus administration of intravenous contrast. Multiplanar CT image reconstructions and MIPs were obtained to evaluate the vascular anatomy. Carotid stenosis measurements (when applicable) are obtained utilizing NASCET criteria, using the distal internal carotid diameter as the denominator. CONTRAST:  75mL OMNIPAQUE IOHEXOL 350 MG/ML SOLN COMPARISON:  Noncontrast head CT performed earlier the same day 01/27/2020. FINDINGS: CTA NECK FINDINGS Aortic arch: Standard aortic branching. The visualized aortic arch is unremarkable. No significant innominate or proximal subclavian artery stenosis. Right carotid system: CCA and ICA patent within the neck without stenosis. Tortuosity of the cervical ICA. Left carotid system: CCA and ICA patent within the neck without stenosis. Mild mixed plaque within the proximal ICA. Vertebral arteries: Left vertebral artery dominant. The vertebral arteries are patent within the neck bilaterally without significant stenosis. Skeleton: No acute bony abnormality or aggressive osseous lesion. Cervical spondylosis. Most notably there is a C5-C6 disc bulge with associated endplate spurring and osteophyte ridge. Bilateral neural foraminal narrowing at least mild bony spinal canal stenosis at this level. Other neck: No neck mass or cervical lymphadenopathy. Upper chest: Centrilobular emphysema. Review of the MIP images confirms the above findings CTA HEAD FINDINGS Anterior circulation: The intracranial internal carotid arteries are patent without significant stenosis. There is a moderate to moderately advanced stenosis within the distal M1 right MCA (series 11, image 58) (series 6, image 289). No right M2 proximal branch occlusion is identified. The M1 left middle cerebral artery is patent without significant stenosis. No left M2 proximal branch occlusion is identified. The anterior cerebral  arteries are patent without high-grade proximal stenosis. The A1 left ACA is dominant. There is an apparent high-grade stenosis within the A3-A4 left ACA (series 10, image 30). No intracranial aneurysm is identified. Posterior circulation: The intracranial internal carotid arteries are patent. Mixed plaque results in sites of up to moderate stenosis within the intracranial right vertebral artery. No more than mild atherosclerotic narrowing of the intracranial left vertebral artery. The basilar artery is patent with mild atherosclerotic irregularity. The posterior cerebral arteries are patent bilaterally without significant proximal stenosis. A left posterior communicating artery is present. A right posterior communicating artery is not definitively identified. Venous sinuses: Within limitations of contrast timing, no convincing thrombus. Anatomic variants: As described. Review of the MIP images confirms the above findings These results were called by telephone at the time of interpretation on 01/27/2020 at 10:20 pm to provider Arther Dames , who verbally acknowledged  these results. IMPRESSION: CTA neck: 1. The bilateral common carotid, internal carotid and vertebral arteries are patent within the neck without significant stenosis. Mild mixed plaque within the proximal left ICA. 2. Pulmonary emphysema. CTA head: 1. No intracranial large vessel occlusion. 2. Atherosclerotic disease with multifocal stenoses, most notably as follows. 3. Moderate to moderately advanced stenosis within the distal M1 right MCA. 4. Apparent high-grade focal stenosis within an A3-A4 left ACA branch vessel. 5. Sites of up to moderate atherosclerotic narrowing within the intracranial right vertebral artery. Electronically Signed   By: Jackey Loge DO   On: 01/27/2020 22:20   CT ANGIO NECK W OR WO CONTRAST  Result Date: 01/27/2020 CLINICAL DATA:  Stroke, follow-up. Additional history provided: Facial droop, left arm weakness. EXAM: CT  ANGIOGRAPHY HEAD AND NECK TECHNIQUE: Multidetector CT imaging of the head and neck was performed using the standard protocol during bolus administration of intravenous contrast. Multiplanar CT image reconstructions and MIPs were obtained to evaluate the vascular anatomy. Carotid stenosis measurements (when applicable) are obtained utilizing NASCET criteria, using the distal internal carotid diameter as the denominator. CONTRAST:  75mL OMNIPAQUE IOHEXOL 350 MG/ML SOLN COMPARISON:  Noncontrast head CT performed earlier the same day 01/27/2020. FINDINGS: CTA NECK FINDINGS Aortic arch: Standard aortic branching. The visualized aortic arch is unremarkable. No significant innominate or proximal subclavian artery stenosis. Right carotid system: CCA and ICA patent within the neck without stenosis. Tortuosity of the cervical ICA. Left carotid system: CCA and ICA patent within the neck without stenosis. Mild mixed plaque within the proximal ICA. Vertebral arteries: Left vertebral artery dominant. The vertebral arteries are patent within the neck bilaterally without significant stenosis. Skeleton: No acute bony abnormality or aggressive osseous lesion. Cervical spondylosis. Most notably there is a C5-C6 disc bulge with associated endplate spurring and osteophyte ridge. Bilateral neural foraminal narrowing at least mild bony spinal canal stenosis at this level. Other neck: No neck mass or cervical lymphadenopathy. Upper chest: Centrilobular emphysema. Review of the MIP images confirms the above findings CTA HEAD FINDINGS Anterior circulation: The intracranial internal carotid arteries are patent without significant stenosis. There is a moderate to moderately advanced stenosis within the distal M1 right MCA (series 11, image 58) (series 6, image 289). No right M2 proximal branch occlusion is identified. The M1 left middle cerebral artery is patent without significant stenosis. No left M2 proximal branch occlusion is identified.  The anterior cerebral arteries are patent without high-grade proximal stenosis. The A1 left ACA is dominant. There is an apparent high-grade stenosis within the A3-A4 left ACA (series 10, image 30). No intracranial aneurysm is identified. Posterior circulation: The intracranial internal carotid arteries are patent. Mixed plaque results in sites of up to moderate stenosis within the intracranial right vertebral artery. No more than mild atherosclerotic narrowing of the intracranial left vertebral artery. The basilar artery is patent with mild atherosclerotic irregularity. The posterior cerebral arteries are patent bilaterally without significant proximal stenosis. A left posterior communicating artery is present. A right posterior communicating artery is not definitively identified. Venous sinuses: Within limitations of contrast timing, no convincing thrombus. Anatomic variants: As described. Review of the MIP images confirms the above findings These results were called by telephone at the time of interpretation on 01/27/2020 at 10:20 pm to provider Arther Dames , who verbally acknowledged these results. IMPRESSION: CTA neck: 1. The bilateral common carotid, internal carotid and vertebral arteries are patent within the neck without significant stenosis. Mild mixed plaque within the proximal left ICA. 2.  Pulmonary emphysema. CTA head: 1. No intracranial large vessel occlusion. 2. Atherosclerotic disease with multifocal stenoses, most notably as follows. 3. Moderate to moderately advanced stenosis within the distal M1 right MCA. 4. Apparent high-grade focal stenosis within an A3-A4 left ACA branch vessel. 5. Sites of up to moderate atherosclerotic narrowing within the intracranial right vertebral artery. Electronically Signed   By: Kellie Simmering DO   On: 01/27/2020 22:20   MR BRAIN WO CONTRAST  Result Date: 01/28/2020 CLINICAL DATA:  Left-sided weakness EXAM: MRI HEAD WITHOUT CONTRAST TECHNIQUE: Multiplanar,  multiecho pulse sequences of the brain and surrounding structures were obtained without intravenous contrast. Coronal T2-weighted imaging and susceptibility weighted imaging were not acquired. COMPARISON:  CTA head 01/27/2020 FINDINGS: Brain: Multifocal abnormal diffusion restriction within the anterior right MCA territory, including along the right precentral gyrus and the right basal ganglia. No hemorrhage or mass effect. Multifocal white matter hyperintensity, most commonly due to chronic ischemic microangiopathy. Normal volume of CSF spaces. No chronic microhemorrhage. Normal midline structures. Vascular: Normal flow voids. Skull and upper cervical spine: Normal marrow signal. Sinuses/Orbits: Negative. Other: None. IMPRESSION: Multifocal acute/early subacute within the anterior right MCA territory. No hemorrhage or mass effect. Electronically Signed   By: Ulyses Jarred M.D.   On: 01/28/2020 01:39   ECHOCARDIOGRAM COMPLETE  Result Date: 01/28/2020    ECHOCARDIOGRAM REPORT   Patient Name:   FINIS A Slotnick Date of Exam: 01/28/2020 Medical Rec #:  [id number]    Height:       72.0 in Accession #:    [id number]   Weight:       195.0 lb Date of Birth:  23-Sep-1961    BSA:          2.108 m Patient Age:    9 years     BP:           135/95 mmHg Patient Gender: M            HR:           83 bpm. Exam Location:  Inpatient Procedure: 2D Echo and Intracardiac Opacification Agent Indications:    TIA 435.9 / G45.9  History:        Patient has prior history of Echocardiogram examinations, most                 recent 06/29/2013. PAD.  Sonographer:    Darlina Sicilian RDCS Referring Phys: [phone] [facility]  Sonographer Comments: Technically difficult study due to poor echo windows. IMPRESSIONS  1. Left ventricular ejection fraction, by estimation, is 60 to 65%. The left ventricle has normal function. The left ventricle has no regional wall motion abnormalities. Left ventricular diastolic function could not be evaluated.  2. Right  ventricular systolic function was not well visualized. The right ventricular size is not well visualized.  3. The mitral valve is grossly normal. No evidence of mitral valve regurgitation.  4. The aortic valve was not well visualized. Aortic valve regurgitation is not visualized.  5. Very limited echo, however, LV function appears normal. FINDINGS  Left Ventricle: Left ventricular ejection fraction, by estimation, is 60 to 65%. The left ventricle has normal function. The left ventricle has no regional wall motion abnormalities. Definity contrast agent was given IV to delineate the left ventricular  endocardial borders. The left ventricular internal cavity size was normal in size. There is no left ventricular hypertrophy. Left ventricular diastolic function could not be evaluated. Right Ventricle: The right ventricular size is not well  visualized. Right vetricular wall thickness was not assessed. Right ventricular systolic function was not well visualized. Left Atrium: Left atrial size was normal in size. Right Atrium: Right atrial size was normal in size. Pericardium: There is no evidence of pericardial effusion. Mitral Valve: The mitral valve is grossly normal. No evidence of mitral valve regurgitation. Tricuspid Valve: The tricuspid valve is not well visualized. Tricuspid valve regurgitation is not demonstrated. Aortic Valve: The aortic valve was not well visualized. Aortic valve regurgitation is not visualized. Pulmonic Valve: The pulmonic valve was not well visualized. Pulmonic valve regurgitation is not visualized. Aorta: The aortic root was not well visualized. Venous: The inferior vena cava was not well visualized. IAS/Shunts: The interatrial septum was not well visualized.  LEFT VENTRICLE PLAX 2D LVIDd:         5.60 cm  Diastology LVIDs:         3.30 cm  LV e' lateral:   8.27 cm/s LV PW:         1.10 cm  LV E/e' lateral: 6.9 LV IVS:        1.10 cm  LV e' medial:    7.94 cm/s LVOT diam:     2.10 cm  LV E/e'  medial:  7.2 LVOT Area:     3.46 cm  MITRAL VALVE MV Area (PHT): 5.02 cm    SHUNTS MV Decel Time: 151 msec    Systemic Diam: 2.10 cm MV E velocity: 56.80 cm/s MV A velocity: 39.90 cm/s MV E/A ratio:  1.42 Zoila Shutter MD Electronically signed by Zoila Shutter MD Signature Date/Time: 01/28/2020/11:11:55 AM    Final    CT HEAD CODE STROKE WO CONTRAST  Result Date: 01/27/2020 CLINICAL DATA:  Code stroke. Facial droop; left arm weakness, last known well 2300 yesterday. EXAM: CT HEAD WITHOUT CONTRAST TECHNIQUE: Contiguous axial images were obtained from the base of the skull through the vertex without intravenous contrast. COMPARISON:  No pertinent prior studies available for comparison. FINDINGS: Brain: There is no evidence of acute intracranial hemorrhage, intracranial mass, midline shift or extra-axial fluid collection.No demarcated cortical infarction. Cerebral volume is normal for age. Vascular: No hyperdense vessel. Skull: Normal. Negative for fracture or focal lesion. Sinuses/Orbits: Visualized orbits demonstrate no acute abnormality. Mild ethmoid sinus mucosal thickening. No significant mastoid effusion. These results were called by telephone at the time of interpretation on 01/27/2020 at 9:36 pm to provider Dr. Laurence Slate, who verbally acknowledged these results. IMPRESSION: No CT evidence of acute intracranial abnormality. Electronically Signed   By: Jackey Loge DO   On: 01/27/2020 21:37   VAS [place] LOWER EXTREMITY VENOUS (DVT)  Result Date: 01/28/2020  Lower Venous DVTStudy Indications: Stroke.  Comparison Study: No prior exam. Performing Technologist: Kennedy Bucker ARDMS, RVT  Examination Guidelines: A complete evaluation includes B-mode imaging, spectral Doppler, color Doppler, and power Doppler as needed of all accessible portions of each vessel. Bilateral testing is considered an integral part of a complete examination. Limited examinations for reoccurring indications may be performed as noted. The  reflux portion of the exam is performed with the patient in reverse Trendelenburg.  +---------+---------------+---------+-----------+----------+--------------+ RIGHT    CompressibilityPhasicitySpontaneityPropertiesThrombus Aging +---------+---------------+---------+-----------+----------+--------------+ CFV      Full           Yes      Yes                                 +---------+---------------+---------+-----------+----------+--------------+ SFJ  Full                                                        +---------+---------------+---------+-----------+----------+--------------+ FV Prox  Full                                                        +---------+---------------+---------+-----------+----------+--------------+ FV Mid   Full                                                        +---------+---------------+---------+-----------+----------+--------------+ FV DistalFull                                                        +---------+---------------+---------+-----------+----------+--------------+ PFV      Full                                                        +---------+---------------+---------+-----------+----------+--------------+ POP      Full           Yes      Yes                                 +---------+---------------+---------+-----------+----------+--------------+ PTV      Full                                                        +---------+---------------+---------+-----------+----------+--------------+ PERO     Full                                                        +---------+---------------+---------+-----------+----------+--------------+   +---------+---------------+---------+-----------+----------+--------------+ LEFT     CompressibilityPhasicitySpontaneityPropertiesThrombus Aging +---------+---------------+---------+-----------+----------+--------------+ CFV      Full           Yes       Yes                                 +---------+---------------+---------+-----------+----------+--------------+ SFJ      Full                                                        +---------+---------------+---------+-----------+----------+--------------+  FV Prox  Full                                                        +---------+---------------+---------+-----------+----------+--------------+ FV Mid   Full                                                        +---------+---------------+---------+-----------+----------+--------------+ FV DistalFull                                                        +---------+---------------+---------+-----------+----------+--------------+ PFV      Full                                                        +---------+---------------+---------+-----------+----------+--------------+ POP      Full           Yes      Yes                                 +---------+---------------+---------+-----------+----------+--------------+ PTV      Full                                                        +---------+---------------+---------+-----------+----------+--------------+ PERO     Full                                                        +---------+---------------+---------+-----------+----------+--------------+     Summary: BILATERAL: - No evidence of deep vein thrombosis seen in the lower extremities, bilaterally.  RIGHT: - No cystic structure found in the popliteal fossa.  LEFT: - No cystic structure found in the popliteal fossa.  *See table(s) above for measurements and observations. Electronically signed by Coral ElseVance Brabham MD on 01/28/2020 at 9:42:45 PM.    Final     Medications:   .  stroke: mapping our early stages of recovery book   Does not apply Once  . aspirin EC  325 mg Oral Daily  . atorvastatin  40 mg Oral q1800  . clopidogrel  75 mg Oral Daily  . enoxaparin (LOVENOX) injection  40 mg Subcutaneous  Q24H  . sodium chloride flush  3 mL Intravenous Once   Continuous Infusions: . sodium chloride 100 mL/hr at 01/28/20 0509     LOS: 1 day   Gwenyth BenderBLACK,Jaskarn Schweer M NP [facility]   How to contact the [facility] Attending or Consulting provider 7A - 7P or covering provider  during after hours 7P -7A, for this patient?  1. Check the care team in [facility] and look for a) attending/consulting TRH provider listed and b) the [facility] team listed 2. Log into [email or web address] and use [place]'s universal password to access. If you do not have the password, please contact the hospital operator. 3. Locate the [facility] provider you are looking for under [facility] and page to a number that you can be directly reached. 4. If you still have difficulty reaching the provider, please page the [facility] (Director on Call) for the Hospitalists listed on amion for assistance.  01/29/2020, 2:49 PM

## 2020-01-29 NOTE — Progress Notes (Signed)
Inpatient Rehabilitation Admissions Coordinator  I met with patient at bedside for rehab assessment. We discussed goals an expectations of an inpt rehab admit. He would prefer CIR admit prior to d/c home alone. I have contacted Development worker, community, Merrily Brittle, to request disability and medicaid applications. I will follow u p tomorrow to clarify when bed available on CIR for the acute team. Please call me with any questions.  Danne Baxter, RN, MSN Rehab Admissions Coordinator [phone] 01/29/2020 5:39 PM

## 2020-01-29 NOTE — Progress Notes (Signed)
Central monitoring called and stated pt had sinus pause with HR 33 (please see flow sheets). Upon assessment, RA lead off patient. Lead placed on patient for accurate HR reading. Pt denies any complaints. Pulse irregular HR mostly sustained in low 50s.

## 2020-01-29 NOTE — Progress Notes (Signed)
Provider paged and made aware pt had a 3.5 sec sinus pause on monitor w/ HR as low as 30s non-sustain. HR usually in 50s, upper 40s. Acute change in MEWS score to yellow.

## 2020-01-29 NOTE — Progress Notes (Signed)
Physical Therapy Treatment Patient Details Name: Cody Baird MRN: [id number] DOB: 1961-06-12 Today's Date: 01/29/2020    History of Present Illness Cody Baird is a 59 y.o. male with peripheral artery disease who does not seek medical care presents to the emergency department for approximately 1 day history of left-sided weakness.  PMH positive for 2 back surgeries, PVD, tobacco use, R LE femoral endarterectomy/thrombectomy/bypass and 3 toes ampuated.    PT Comments    Pt motivated to participate in PT this session, although states he feels centrally fatigued and has since admission. Pt ambulated 25 ft with RW x2, requiring seated rest break to recover. PT emphasized upright posture, good placement of L foot especially in midstance as pt with preference for eversion (new since CVA). Pt also with improving static and dynamic sitting balance and tolerance today. Pt states he feels "emotional or something" since having the stroke, and repeatedly put himself down for mobility difficulties. PT encouraged pt throughout session and stated how important intensive therapy is post-CVA. PT also educated pt on BE FAST in case of future CVA. PT to continue to follow acutely.    Follow Up Recommendations  CIR     Equipment Recommendations  Rolling walker with 5" wheels    Recommendations for Other Services Rehab consult     Precautions / Restrictions Precautions Precautions: Fall Precaution Comments: L side weakness Restrictions Weight Bearing Restrictions: No    Mobility  Bed Mobility Overal bed mobility: Needs Assistance Bed Mobility: Supine to Sit;Sit to Supine     Supine to sit: HOB elevated;Min guard Sit to supine: Min assist;HOB elevated   General bed mobility comments: Min guard for supine to sit for safety, increased time and effort to perform especially scoot to and from EOB. Min assist for return to supine for LLE lifting into bed, shoulder positioning once in  supine.  Transfers Overall transfer level: Needs assistance Equipment used: Rolling walker (2 wheeled) Transfers: Sit to/from Stand Sit to Stand: From elevated surface;Min assist         General transfer comment: Min assist for power up, steadying. Verbal cuing for hand placement, with tactile facilitation for L hand placement on RW.  Ambulation/Gait Ambulation/Gait assistance: Mod assist Gait Distance (Feet): 25 Feet(2x25 ft) Assistive device: Rolling walker (2 wheeled) Gait Pattern/deviations: Step-to pattern;Step-through pattern;Decreased stride length;Decreased dorsiflexion - left;Wide base of support;Shuffle Gait velocity: decr   General Gait Details: Mod assist for directing pt and RW as pt with L lateral leaning and preference, steadying. Verbal cuing for placement in RW, L foot flat during stance as pt with preference for WB through lateral portion of foot in signficant inversion. Seated rest break x2 minutes   Stairs             Wheelchair Mobility    Modified Rankin (Stroke Patients Only)       Balance Overall balance assessment: Needs assistance Sitting-balance support: Feet supported Sitting balance-Leahy Scale: Fair Sitting balance - Comments: No L lateral leaning in sitting this session, practiced L lateral scooting at EOB, static sitting. Sat EOB both static and dynamic x10 minutes   Standing balance support: Bilateral upper extremity supported Standing balance-Leahy Scale: Poor Standing balance comment: reliant on external support, L lateral leaning in dynamic standing                            Cognition Arousal/Alertness: Awake/alert Behavior During Therapy: WFL for tasks assessed/performed Overall Cognitive Status:  Impaired/Different from baseline Area of Impairment: Attention;Memory;Following commands                   Current Attention Level: Sustained Memory: Decreased short-term memory Following Commands: Follows one  step commands consistently;Follows one step commands with increased time Safety/Judgement: Decreased awareness of safety;Decreased awareness of deficits     General Comments: needs PT cuing to call attention to deficits, especially during ambulation      Exercises General Exercises - Lower Extremity Ankle Circles/Pumps: AROM;Both;5 reps;Supine(with emphasis on DF/eversion, PF/inversion coupling) Straight Leg Raises: AROM;Left;5 reps;Supine    General Comments        Pertinent Vitals/Pain Pain Assessment: No/denies pain    Home Living                      Prior Function            PT Goals (current goals can now be found in the care plan section) Acute Rehab PT Goals Patient Stated Goal: to return to independence PT Goal Formulation: With patient Time For Goal Achievement: 02/11/20 Potential to Achieve Goals: Good Progress towards PT goals: Progressing toward goals    Frequency    Min 4X/week      PT Plan Current plan remains appropriate    Co-evaluation              AM-PAC PT "6 Clicks" Mobility   Outcome Measure  Help needed turning from your back to your side while in a flat bed without using bedrails?: A Little Help needed moving from lying on your back to sitting on the side of a flat bed without using bedrails?: A Little Help needed moving to and from a bed to a chair (including a wheelchair)?: A Little Help needed standing up from a chair using your arms (e.g., wheelchair or bedside chair)?: A Little Help needed to walk in hospital room?: A Lot Help needed climbing 3-5 steps with a railing? : Total 6 Click Score: 15    End of Session   Activity Tolerance: Patient limited by fatigue Patient left: in bed;with bed alarm set;with call bell/phone within reach Nurse Communication: Mobility status PT Visit Diagnosis: Other abnormalities of gait and mobility (R26.89);Other symptoms and signs involving the nervous system (R29.898);Hemiplegia  and hemiparesis Hemiplegia - Right/Left: Left Hemiplegia - dominant/non-dominant: Non-dominant Hemiplegia - caused by: Cerebral infarction     Time: 1227-1250 PT Time Calculation (min) (ACUTE ONLY): 23 min  Charges:  $Gait Training: 8-22 mins $Neuromuscular Re-education: 8-22 mins                     Mallory Enriques E, [place] Pager [phone]  Office [phone]    Kerrigan Glendening D Elonda Husky 01/29/2020, 3:46 PM

## 2020-01-29 NOTE — Progress Notes (Signed)
TCD bubble study completed. Refer to "CV Proc" under chart review to view preliminary results.  01/29/2020 4:19 PM Eula Fried., MHA, RVT, RDCS, RDMS

## 2020-01-29 NOTE — Evaluation (Signed)
Speech Language Pathology Evaluation Patient Details Name: Cody Baird MRN: [id number] DOB: [birth year] Today's Date: 01/29/2020 Time: 0867-6195 SLP Time Calculation (min) (ACUTE ONLY): 30 min  Problem List:  Patient Active Problem List   Diagnosis Date Noted  . Tobacco abuse 01/28/2020  . Essential hypertension 01/28/2020  . TIA (transient ischemic attack) 01/28/2020  . Stroke ([place]) 01/28/2020  . CVA (cerebral vascular accident) ([place]) 01/28/2020  . Aftercare following surgery of the circulatory system, [place] 01/14/2014  . Ischemic pain of foot 12/24/2013  . Atherosclerotic peripheral vascular disease with rest pain ([place]) 10/30/2013  . Post-op pain 09/17/2013  . PVD (peripheral vascular disease) ([place]) 09/17/2013  . Post-operative pain 07/16/2013  . 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 Peripheral vascular disease, unspecified ([place]) 06/25/2013  . Pain in limb 06/25/2013   Past Medical History:  Past Medical History:  Diagnosis Date  . Arthritis    low back and left hip  . Chronic back pain   . Headache(784.0)    occasionally and sinus related   . History of bronchitis    last time about 2-68yrs ago  . PAD (peripheral artery disease) ([place])   . Peripheral vascular disease ([place])   . Prolapsed, anus    hx of   Past Surgical History:  Past Surgical History:  Procedure Laterality Date  . ABDOMINAL AORTAGRAM N/A 06/26/2013   Procedure: ABDOMINAL AORTAGRAM;  Surgeon: Rosetta Posner, MD;  Location: [facility] CATH LAB;  Service: Cardiovascular;  Laterality: N/A;  . AMPUTATION Right 12/04/2013   Procedure: AMPUTATION DIGIT- 3RD TOE RIGHT FOOT ;  Surgeon: Rosetta Posner, MD;  Location: [place];  Service: Vascular;  Laterality: Right;  . AMPUTATION Right 01/15/2014   Procedure: AMPUTATION OF RIGHT 4TH and 5TH TOES;  Surgeon: Rosetta Posner, MD;  Location: [place];  Service: Vascular;  Laterality: Right;  . BACK SURGERY     L1-2  . BACK SURGERY     L1-2  . BACK SURGERY     thinks L1- 2  . COLON SURGERY     removal of colon due to proplase rectum  . COLOSTOMY    . COLOSTOMY CLOSURE  2006ish  . ESOPHAGOGASTRODUODENOSCOPY    . FEMORAL-TIBIAL BYPASS GRAFT Right 06/28/2013   Procedure: BYPASS GRAFT FEMORAL TO TIBIAL PERONEAL TRUNK ARTERY;  Surgeon: Rosetta Posner, MD;  Location[facility] OR;  Service: Vascular;  Laterality: Right;   HPI:  59 yo male adm to [facility] with left sided weakness.  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  His PMH includes smoking for 40 years.  Speech/cog eval ordered.   Assessment / Plan / Recommendation Clinical Impression  SLP administered MOCA 7.2 to pt resulting in score of 25/30 - one point below normal.  Areas of strength included language, abstract thought and orientation. Pt without dysarthria or language deficits fortuantely. He does appear with minimal left labial and nasofold asymmetry.  Pt admits to decrease in attention and feeling "foggy".  Given pt was independent prior to admission, SLP follow up would be beneficial to maximzie his attention and thus functional memory to decrease caregiver burden and maximize his independence. Pt agreeable to plan and was educated to results of testing.    SLP Assessment  SLP Recommendation/Assessment: All further Speech Lanaguage Pathology  needs can be addressed in the next venue of care SLP Visit Diagnosis: Cognitive communication deficit (R41.841)    Follow Up  Recommendations  Inpatient Rehab    Frequency and Duration     N/A      SLP Evaluation Cognition  Overall Cognitive Status: Impaired/Different from baseline Orientation Level: Oriented X4 Attention: Sustained;Selective Sustained Attention: Impaired Sustained Attention Impairment: Verbal complex Memory: Impaired Memory Impairment: Retrieval deficit(4/5 words recalled independently, 1/5 with multiple choice cue) Awareness: Impaired Awareness  Impairment: Intellectual impairment(decreased awareness to leaning to the left until made aware) Problem Solving: Appears intact       Comprehension  Auditory Comprehension Overall Auditory Comprehension: Appears within functional limits for tasks assessed Commands: Within Functional Limits Conversation: Complex Visual Recognition/Discrimination Discrimination: Within Function Limits Reading Comprehension Reading Status: Not tested    Expression Expression Primary Mode of Expression: Verbal Verbal Expression Overall Verbal Expression: Appears within functional limits for tasks assessed Initiation: No impairment Repetition: No impairment Naming: No impairment Pragmatics: No impairment(? mild flat affect but could be baseline and pt admits he is "tired") Written Expression Dominant Hand: Right   Oral / Motor  Oral Motor/Sensory Function Overall Oral Motor/Sensory Function: Mild impairment Facial ROM: Within Functional Limits Facial Symmetry: Abnormal symmetry left Facial Strength: Reduced left Facial Sensation: Within Functional Limits Lingual ROM: Within Functional Limits Lingual Symmetry: Within Functional Limits Lingual Strength: Within Functional Limits Lingual Sensation: Within Functional Limits Velum: Within Functional Limits Motor Speech Overall Motor Speech: Appears within functional limits for tasks assessed Respiration: Within functional limits Phonation: Normal Articulation: Within functional limitis Intelligibility: Intelligible Motor Planning: Witnin functional limits Motor Speech Errors: Not applicable   GO                    Chales Abrahams 01/29/2020, 9:55 AM   Rolena Infante, MS [facility] SLP Acute Rehab Services Office [phone]

## 2020-01-30 ENCOUNTER — Encounter (HOSPITAL_COMMUNITY): Payer: Self-pay | Admitting: Internal Medicine

## 2020-01-30 ENCOUNTER — Inpatient Hospital Stay (HOSPITAL_COMMUNITY)
Admission: RE | Admit: 2020-01-30 | Discharge: 2020-02-06 | DRG: 057 | Disposition: A | Discharge status: Home / Self Care | Payer: MEDICAID | Source: Intra-hospital | Attending: Physical Medicine & Rehabilitation | Admitting: Physical Medicine & Rehabilitation

## 2020-01-30 DIAGNOSIS — I10 Essential (primary) hypertension: Secondary | ICD-10-CM | POA: Diagnosis present

## 2020-01-30 DIAGNOSIS — D751 Secondary polycythemia: Secondary | ICD-10-CM | POA: Diagnosis present

## 2020-01-30 DIAGNOSIS — Z8249 Family history of ischemic heart disease and other diseases of the circulatory system: Secondary | ICD-10-CM

## 2020-01-30 DIAGNOSIS — Z833 Family history of diabetes mellitus: Secondary | ICD-10-CM

## 2020-01-30 DIAGNOSIS — Z89421 Acquired absence of other right toe(s): Secondary | ICD-10-CM

## 2020-01-30 DIAGNOSIS — M25571 Pain in right ankle and joints of right foot: Secondary | ICD-10-CM | POA: Diagnosis present

## 2020-01-30 DIAGNOSIS — M25512 Pain in left shoulder: Secondary | ICD-10-CM | POA: Diagnosis not present

## 2020-01-30 DIAGNOSIS — I69322 Dysarthria following cerebral infarction: Secondary | ICD-10-CM

## 2020-01-30 DIAGNOSIS — J439 Emphysema, unspecified: Secondary | ICD-10-CM | POA: Diagnosis present

## 2020-01-30 DIAGNOSIS — Z720 Tobacco use: Secondary | ICD-10-CM | POA: Diagnosis present

## 2020-01-30 DIAGNOSIS — R197 Diarrhea, unspecified: Secondary | ICD-10-CM | POA: Diagnosis present

## 2020-01-30 DIAGNOSIS — Z23 Encounter for immunization: Secondary | ICD-10-CM

## 2020-01-30 DIAGNOSIS — I63511 Cerebral infarction due to unspecified occlusion or stenosis of right middle cerebral artery: Secondary | ICD-10-CM

## 2020-01-30 DIAGNOSIS — F17210 Nicotine dependence, cigarettes, uncomplicated: Secondary | ICD-10-CM | POA: Diagnosis present

## 2020-01-30 DIAGNOSIS — R414 Neurologic neglect syndrome: Secondary | ICD-10-CM

## 2020-01-30 DIAGNOSIS — M1612 Unilateral primary osteoarthritis, left hip: Secondary | ICD-10-CM | POA: Diagnosis present

## 2020-01-30 DIAGNOSIS — R001 Bradycardia, unspecified: Secondary | ICD-10-CM | POA: Diagnosis present

## 2020-01-30 DIAGNOSIS — E538 Deficiency of other specified B group vitamins: Secondary | ICD-10-CM | POA: Diagnosis present

## 2020-01-30 DIAGNOSIS — E876 Hypokalemia: Secondary | ICD-10-CM | POA: Diagnosis not present

## 2020-01-30 DIAGNOSIS — I69354 Hemiplegia and hemiparesis following cerebral infarction affecting left non-dominant side: Principal | ICD-10-CM

## 2020-01-30 DIAGNOSIS — Z79899 Other long term (current) drug therapy: Secondary | ICD-10-CM

## 2020-01-30 DIAGNOSIS — Z880 Allergy status to penicillin: Secondary | ICD-10-CM

## 2020-01-30 DIAGNOSIS — M549 Dorsalgia, unspecified: Secondary | ICD-10-CM | POA: Diagnosis present

## 2020-01-30 DIAGNOSIS — Z8349 Family history of other endocrine, nutritional and metabolic diseases: Secondary | ICD-10-CM

## 2020-01-30 DIAGNOSIS — F419 Anxiety disorder, unspecified: Secondary | ICD-10-CM | POA: Diagnosis present

## 2020-01-30 DIAGNOSIS — F482 Pseudobulbar affect: Secondary | ICD-10-CM | POA: Diagnosis present

## 2020-01-30 DIAGNOSIS — D72829 Elevated white blood cell count, unspecified: Secondary | ICD-10-CM | POA: Diagnosis present

## 2020-01-30 DIAGNOSIS — G8929 Other chronic pain: Secondary | ICD-10-CM | POA: Diagnosis present

## 2020-01-30 DIAGNOSIS — I69392 Facial weakness following cerebral infarction: Secondary | ICD-10-CM

## 2020-01-30 DIAGNOSIS — I69311 Memory deficit following cerebral infarction: Secondary | ICD-10-CM

## 2020-01-30 DIAGNOSIS — I739 Peripheral vascular disease, unspecified: Secondary | ICD-10-CM | POA: Diagnosis present

## 2020-01-30 HISTORY — DX: Cerebral infarction due to unspecified occlusion or stenosis of right middle cerebral artery: I63.511

## 2020-01-30 MED ORDER — TRAZODONE HCL 50 MG PO TABS
25.0000 mg | ORAL_TABLET | Freq: Every evening | ORAL | Status: DC | PRN
  Administered 2020-01-30: 50 mg via ORAL
  Filled 2020-01-30: qty 1

## 2020-01-30 MED ORDER — CLOPIDOGREL BISULFATE 75 MG PO TABS: 75.0000 mg | ORAL_TABLET | Freq: Every day | ORAL | 0 refills | Status: DC

## 2020-01-30 MED ORDER — ASPIRIN EC 325 MG PO TBEC
325.0000 mg | DELAYED_RELEASE_TABLET | Freq: Every day | ORAL | Status: DC
  Administered 2020-01-31 – 2020-02-06 (×7): 325 mg via ORAL
  Filled 2020-01-30 (×7): qty 1

## 2020-01-30 MED ORDER — ENOXAPARIN SODIUM 40 MG/0.4ML ~~LOC~~ SOLN
40.0000 mg | SUBCUTANEOUS | Status: DC
  Administered 2020-01-31 – 2020-02-06 (×7): 40 mg via SUBCUTANEOUS
  Filled 2020-01-30 (×7): qty 0.4

## 2020-01-30 MED ORDER — CYANOCOBALAMIN 1000 MCG PO TABS: 1000.0000 ug | ORAL_TABLET | Freq: Every day | ORAL | 0 refills | Status: DC

## 2020-01-30 MED ORDER — CLOPIDOGREL BISULFATE 75 MG PO TABS
75.0000 mg | ORAL_TABLET | Freq: Every day | ORAL | Status: DC
  Administered 2020-01-31 – 2020-02-06 (×7): 75 mg via ORAL
  Filled 2020-01-30 (×7): qty 1

## 2020-01-30 MED ORDER — ACETAMINOPHEN 325 MG PO TABS
325.0000 mg | ORAL_TABLET | ORAL | Status: DC | PRN
  Administered 2020-02-01 – 2020-02-03 (×3): 650 mg via ORAL
  Filled 2020-01-30 (×3): qty 2

## 2020-01-30 MED ORDER — PROCHLORPERAZINE 25 MG RE SUPP: 12.5000 mg | Freq: Four times a day (QID) | RECTAL | Status: DC | PRN

## 2020-01-30 MED ORDER — PROCHLORPERAZINE EDISYLATE 10 MG/2ML IJ SOLN: 5.0000 mg | Freq: Four times a day (QID) | INTRAMUSCULAR | Status: DC | PRN

## 2020-01-30 MED ORDER — PROCHLORPERAZINE MALEATE 5 MG PO TABS: 5.0000 mg | ORAL_TABLET | Freq: Four times a day (QID) | ORAL | Status: DC | PRN

## 2020-01-30 MED ORDER — ASPIRIN 325 MG PO TBEC: 325.0000 mg | DELAYED_RELEASE_TABLET | Freq: Every day | ORAL | 0 refills | Status: DC

## 2020-01-30 MED ORDER — ATORVASTATIN CALCIUM 40 MG PO TABS
40.0000 mg | ORAL_TABLET | Freq: Every day | ORAL | Status: DC
  Administered 2020-01-30 – 2020-02-05 (×7): 40 mg via ORAL
  Filled 2020-01-30 (×7): qty 1

## 2020-01-30 MED ORDER — BISACODYL 10 MG RE SUPP: 10.0000 mg | Freq: Every day | RECTAL | Status: DC | PRN

## 2020-01-30 MED ORDER — ALUM & MAG HYDROXIDE-SIMETH 200-200-20 MG/5ML PO SUSP
30.0000 mL | ORAL | Status: DC | PRN
  Administered 2020-02-02: 30 mL via ORAL
  Filled 2020-01-30: qty 30

## 2020-01-30 MED ORDER — ATORVASTATIN CALCIUM 40 MG PO TABS: 40.0000 mg | ORAL_TABLET | Freq: Every day | ORAL | 0 refills | Status: DC

## 2020-01-30 MED ORDER — SENNOSIDES-DOCUSATE SODIUM 8.6-50 MG PO TABS: 1.0000 | ORAL_TABLET | Freq: Every evening | ORAL | Status: DC | PRN

## 2020-01-30 MED ORDER — FLEET ENEMA 7-19 GM/118ML RE ENEM: 1.0000 | ENEMA | Freq: Once | RECTAL | Status: DC | PRN

## 2020-01-30 MED ORDER — VITAMIN B-12 1000 MCG PO TABS
1000.0000 ug | ORAL_TABLET | Freq: Every day | ORAL | Status: DC
  Administered 2020-01-31 – 2020-02-06 (×7): 1000 ug via ORAL
  Filled 2020-01-30 (×7): qty 1

## 2020-01-30 MED ORDER — PNEUMOCOCCAL VAC POLYVALENT 25 MCG/0.5ML IJ INJ
0.5000 mL | INJECTION | INTRAMUSCULAR | Status: AC
  Administered 2020-01-31: 0.5 mL via INTRAMUSCULAR
  Filled 2020-01-30: qty 0.5

## 2020-01-30 MED ORDER — NICOTINE POLACRILEX 2 MG MT GUM
2.0000 mg | CHEWING_GUM | OROMUCOSAL | Status: DC | PRN
  Filled 2020-01-30: qty 1

## 2020-01-30 MED ORDER — INFLUENZA VAC SPLIT QUAD 0.5 ML IM SUSY
0.5000 mL | PREFILLED_SYRINGE | INTRAMUSCULAR | Status: AC
  Administered 2020-01-31: 0.5 mL via INTRAMUSCULAR
  Filled 2020-01-30: qty 0.5

## 2020-01-30 MED ORDER — POLYETHYLENE GLYCOL 3350 17 G PO PACK: 17.0000 g | PACK | Freq: Every day | ORAL | Status: DC | PRN

## 2020-01-30 MED ORDER — DIPHENHYDRAMINE HCL 12.5 MG/5ML PO ELIX: 12.5000 mg | ORAL_SOLUTION | Freq: Four times a day (QID) | ORAL | Status: DC | PRN

## 2020-01-30 MED ORDER — GUAIFENESIN-DM 100-10 MG/5ML PO SYRP: 5.0000 mL | ORAL_SOLUTION | Freq: Four times a day (QID) | ORAL | Status: DC | PRN

## 2020-01-30 MED ORDER — STROKE: EARLY STAGES OF RECOVERY BOOK: 1.0000 | Freq: Once | Status: DC

## 2020-01-30 NOTE — Plan of Care (Signed)
  Problem: Consults Goal: RH STROKE PATIENT EDUCATION Description: See Patient Education module for education specifics  Outcome: Progressing Goal: Nutrition Consult-if indicated Outcome: Progressing   Problem: RH SKIN INTEGRITY Goal: RH STG SKIN FREE OF INFECTION/BREAKDOWN Description: Patient will not have skin breakdown at discharge. Outcome: Progressing Goal: RH STG MAINTAIN SKIN INTEGRITY WITH ASSISTANCE Description: STG Maintain Skin Integrity With  mod I  Outcome: Progressing   Problem: RH SAFETY Goal: RH STG ADHERE TO SAFETY PRECAUTIONS W/ASSISTANCE/DEVICE Description: STG Adhere to Safety Precautions With  min Assistance/Device. Outcome: Progressing   Problem: RH PAIN MANAGEMENT Goal: RH STG PAIN MANAGED AT OR BELOW PT'S PAIN GOAL Description: Less than 3 Outcome: Progressing   Problem: RH KNOWLEDGE DEFICIT Goal: RH STG INCREASE KNOWLEDGE OF HYPERTENSION Description: Patient will verbalize understanding of how to manage hypertension with medications Outcome: Progressing Goal: RH STG INCREASE KNOWLEGDE OF HYPERLIPIDEMIA Description: Patent will verbalize understanding of how to manage hyperlipidemia with medications prior to discharge. Outcome: Progressing   

## 2020-01-30 NOTE — Progress Notes (Signed)
Patient is currently resting in room, watching TV. Patient denies any pains. Will continue to monitor. Leane Para, LPN

## 2020-01-30 NOTE — Plan of Care (Signed)
Plan of care reviewed with pt at bedside. Discharge instructions complete. Pt verbalizes understanding. Pt will go to CIR. Ambulates with walker. Safety measures in place, call bell in reach. Will continue to monitor until dc from floor.  Problem: Education: Goal: Knowledge of General Education information will improve Description: Including pain rating scale, medication(s)/side effects and non-pharmacologic comfort measures Outcome: Adequate for Discharge   Problem: Health Behavior/Discharge Planning: Goal: Ability to manage health-related needs will improve Outcome: Adequate for Discharge   Problem: Clinical Measurements: Goal: Ability to maintain clinical measurements within normal limits will improve Outcome: Adequate for Discharge Goal: Will remain free from infection Outcome: Adequate for Discharge Goal: Diagnostic test results will improve Outcome: Adequate for Discharge Goal: Respiratory complications will improve Outcome: Adequate for Discharge Goal: Cardiovascular complication will be avoided Outcome: Adequate for Discharge   Problem: Activity: Goal: Risk for activity intolerance will decrease Outcome: Adequate for Discharge   Problem: Nutrition: Goal: Adequate nutrition will be maintained Outcome: Adequate for Discharge   Problem: Coping: Goal: Level of anxiety will decrease Outcome: Adequate for Discharge   Problem: Elimination: Goal: Will not experience complications related to bowel motility Outcome: Adequate for Discharge Goal: Will not experience complications related to urinary retention Outcome: Adequate for Discharge   Problem: Pain Managment: Goal: General experience of comfort will improve Outcome: Adequate for Discharge   Problem: Safety: Goal: Ability to remain free from injury will improve Outcome: Adequate for Discharge   Problem: Skin Integrity: Goal: Risk for impaired skin integrity will decrease Outcome: Adequate for Discharge    Problem: Education: Goal: Knowledge of disease or condition will improve Outcome: Adequate for Discharge Goal: Knowledge of secondary prevention will improve Outcome: Adequate for Discharge   Problem: Coping: Goal: Will verbalize positive feelings about self Outcome: Adequate for Discharge Goal: Will identify appropriate support needs Outcome: Adequate for Discharge   Problem: Health Behavior/Discharge Planning: Goal: Ability to manage health-related needs will improve Outcome: Adequate for Discharge   Problem: Self-Care: Goal: Ability to participate in self-care as condition permits will improve Outcome: Adequate for Discharge Goal: Verbalization of feelings and concerns over difficulty with self-care will improve Outcome: Adequate for Discharge Goal: Ability to communicate needs accurately will improve Outcome: Adequate for Discharge   Problem: Nutrition: Goal: Risk of aspiration will decrease Outcome: Adequate for Discharge Goal: Dietary intake will improve Outcome: Adequate for Discharge   Problem: Acute Rehab PT Goals(only PT should resolve) Goal: Pt will Roll Supine to Side Outcome: Adequate for Discharge Goal: Pt Will Go Supine/Side To Sit Outcome: Adequate for Discharge Goal: Patient Will Perform Sitting Balance Outcome: Adequate for Discharge Goal: Patient Will Transfer Sit To/From Stand Outcome: Adequate for Discharge Goal: Pt Will Perform Standing Balance Or Pre-Gait Outcome: Adequate for Discharge Goal: Pt Will Ambulate Outcome: Adequate for Discharge Goal: Pt Will Go Up/Down Stairs Outcome: Adequate for Discharge   Problem: Acute Rehab OT Goals (only OT should resolve) Goal: Pt. Will Perform Grooming Outcome: Adequate for Discharge Goal: Pt. Will Perform Upper Body Bathing Outcome: Adequate for Discharge Goal: Pt. Will Perform Lower Body Bathing Outcome: Adequate for Discharge Goal: Pt. Will Transfer To Toilet Outcome: Adequate for Discharge Goal:  OT Additional ADL Goal #1 Outcome: Adequate for Discharge Goal: OT Additional ADL Goal #2 Outcome: Adequate for Discharge

## 2020-01-30 NOTE — H&P (Signed)
Physical Medicine and Rehabilitation Admission H&P    Chief Complaint  Patient presents with  . Stroke with functional deficits.     HPI: Cody Baird is a 59 year old male with history of PAD/PVD, ongoing tobacco use 2 PPD, chronic back pain who was admitted on 01/27/20 with progressive left sided weakness and slurred speech. CTA head/neck showed moderate to moderately advanced stenosis distal M1 right MCA and high grade focal stenosis A3-A4 left ACA branch as well as pulmonary emphysema. MRI brain showed multifocal acute/subacute infarcts within anterior right MCA territory. 2D echo showed EF 60-65% without wall abnormality. Workup also revealed pesistent polycythemia and low vitamin B12 levels. TEE was negative for PFO but showed small insignificant pulmonary shunt.  BLE dopplers negative for DVT.  He has had asymptomatic bradycardia with 3.5 second pause and stroke felt to be embolic due to uncertain etiology. 30 day cardiac monitor recommended as outpatient to rule out A fib. Dr. Roda Shutters recommends DAPT x 3 months followed by ASA alone. Therapy ongoing and patient continues to have limitations in mobility and ADLs due to left sided weakness. CIR recommended due to functional decline.    Pt reports needs to have BM- LBM 3-4 days ago- is NORMAL for him to go days without BM since reversal of colostomy.   Admits brain is still foggy and words "crooked".    Review of Systems  Constitutional: Negative for chills and fever.  HENT: Negative for hearing loss.   Eyes: Negative for double vision.  Respiratory: Negative for cough and shortness of breath.   Cardiovascular: Negative for chest pain and palpitations.  Gastrointestinal: Negative for abdominal pain and constipation.  Genitourinary: Negative for hematuria.  Neurological: Positive for sensory change, focal weakness and weakness. Negative for dizziness and headaches.  Psychiatric/Behavioral: The patient is not nervous/anxious.        Past Medical History:  Diagnosis Date  . Arthritis    low back and left hip  . B12 deficiency   . Bradycardia   . Chronic back pain   . Headache(784.0)    occasionally and sinus related   . History of bronchitis    last time about 2-16yrs ago  . PAD (peripheral artery disease) (HCC)   . Peripheral vascular disease (HCC)   . Prolapsed, anus    hx of  . Stroke [facility]) 01/2020   Past Surgical History:  Procedure Laterality Date  . ABDOMINAL AORTAGRAM N/A 06/26/2013   Procedure: ABDOMINAL AORTAGRAM;  Surgeon: Larina Earthly, MD;  Location: [facility] CATH LAB;  Service: Cardiovascular;  Laterality: N/A;  . AMPUTATION Right 12/04/2013   Procedure: AMPUTATION DIGIT- 3RD TOE RIGHT FOOT ;  Surgeon: Larina Earthly, MD;  Location: [facility] OR;  Service: Vascular;  Laterality: Right;  . AMPUTATION Right 01/15/2014   Procedure: AMPUTATION OF RIGHT 4TH and 5TH TOES;  Surgeon: Larina Earthly, MD;  Location: [facility] OR;  Service: Vascular;  Laterality: Right;  . BACK SURGERY     L1-2  . BACK SURGERY     L1-2  . BACK SURGERY     thinks L1- 2  . COLON SURGERY     removal of colon due to proplase rectum  . COLOSTOMY    . COLOSTOMY CLOSURE  2006ish  . ESOPHAGOGASTRODUODENOSCOPY    . FEMORAL-TIBIAL BYPASS GRAFT Right 06/28/2013   Procedure: BYPASS GRAFT FEMORAL TO TIBIAL PERONEAL TRUNK ARTERY;  Surgeon: Larina Earthly, MD;  Location: [facility] OR;  Service: Vascular;  Laterality: Right;  Family History  Problem Relation Age of Onset  . Cancer Mother   . Diabetes Mother   . Diabetes Father   . Hyperlipidemia Father   . Hypertension Father   . Heart attack Father    Social History:  reports that he has been smoking cigarettes. He has a 18.00 pack-year smoking history. He has never used smokeless tobacco. He reports that he does not drink alcohol or use drugs. Allergies:  Allergies  Allergen Reactions  . Penicillins Other (See Comments)    Childhood allergy not sure what the reaction is  Did it involve swelling of the  face/tongue/throat, SOB, or low BP? Unknown Did it involve sudden or severe rash/hives, skin peeling, or any reaction on the inside of your mouth or nose? Unknown Did you need to seek medical attention at a hospital or doctor's office? Unknown When did it last happen?childhood If all above answers are "NO", may proceed with cephalosporin use.    Medications Prior to Admission  Medication Sig Dispense Refill  .  stroke: mapping our early stages of recovery book MISC 1 each by Does not apply route once for 1 dose.    Derrill Memo ON 01/31/2020] aspirin EC 325 MG EC tablet Take 1 tablet (325 mg total) by mouth daily. 30 tablet 0  . atorvastatin (LIPITOR) 40 MG tablet Take 1 tablet (40 mg total) by mouth daily at 6 PM. 30 tablet 0  . [START ON 01/31/2020] clopidogrel (PLAVIX) 75 MG tablet Take 1 tablet (75 mg total) by mouth daily. Take one tab daily for 90 days then stop 90 tablet 0  . senna-docusate (SENOKOT-S) 8.6-50 MG tablet Take 1 tablet by mouth at bedtime as needed for moderate constipation.    Derrill Memo ON 01/31/2020] vitamin B-12 1000 MCG tablet Take 1 tablet (1,000 mcg total) by mouth daily. 30 tablet 0    Drug Regimen Review  Drug regimen was reviewed and remains appropriate with no significant issues identified  Home:     Functional History:    Functional Status:  Mobility:          ADL:    Cognition:       Height 6\' 1"  (1.854 m), weight 104.9 kg. Physical Exam  Nursing note and vitals reviewed. Constitutional: He is oriented to person, place, and time. He appears well-developed and well-nourished.  HENT:  Head: Normocephalic and atraumatic.  Nose: Nose normal.  Mouth/Throat: Oropharynx is clear and moist. No oropharyngeal exudate.  L facial droop noted- no facial sensation changes Tongue midline- is coated of note.   Eyes: Conjunctivae are normal. Right eye exhibits no discharge. Left eye exhibits no discharge.  EOMI B/L- no nystagmus seen  Neck: No  tracheal deviation present.  Cardiovascular:  RRR; no M/R/G  Respiratory: No stridor.  CTA B/L- no W/R/R/ good air movement  GI: He exhibits no distension.  Soft, NT, ND, (+) hypoactive BS  Musculoskeletal:     Cervical back: Normal range of motion and neck supple.     Comments: LUE- deltoid 3+/5, biceps 3+/5, triceps 3+/5, WE, grip and finger abd 3+/5- of note, was 2/5 2-3 days ago   RUE- 5/5 in all same muscles RLE- 5/5 LLE- 5-/5 in HF, KE, KF, DF and PF   Neurological: He is alert and oriented to person, place, and time.  Minimal dysarthria--has poor safety awareness. Walked to bathroom independently without walker. Left sensory deficits with left inattention.   Still has L inattention- didn't even notice L hand  was stuck between bed and mattress 2x while in room- told me "had no strength in L side" while testing shows differently.   Sensation - hard to tell- says it's intact, however, pt didn't even notice me touching LUE- thinks it's decrease No increased tone, clonus or Hoffman's so far  Skin: Skin is warm and dry.  Psychiatric:  Flattened affect; sometimes inappropriate    No results found for this or any previous visit (from the past 48 hour(s)). VAS [place] TRANSCRANIAL DOPPLER W BUBBLES  Result Date: 01/29/2020  Transcranial Doppler with Bubble Indications: Stroke. Comparison Study: No prior study Performing Technologist: Gertie Fey MHA, RDMS, RVT, RDCS  Examination Guidelines: A complete evaluation includes B-mode imaging, spectral Doppler, color Doppler, and power Doppler as needed of all accessible portions of each vessel. Bilateral testing is considered an integral part of a complete examination. Limited examinations for reoccurring indications may be performed as noted.  Summary:  A vascular evaluation was performed. The right middle cerebral artery was studied. An IV was inserted into the patient's left forearm. Verbal informed consent was obtained.  No HITs (high  intensity transient signals) were observed within 2-3 cardiac cycles at rest or with valsalva maneuver, indicating no evidence of intracardiac right to left shunt. However, there was evidence of delayed HITS at rest and with valsalva maneuver, with significant longer duration (up to 9 min), suggestive of possible intrapulmonary shunt. Further imaging like CTA chest may be warranted if clinically indicated. *See table(s) above for TCD measurements and observations.  Diagnosing physician: Marvel Plan MD Electronically signed by Marvel Plan MD on 01/29/2020 at 7:30:08 PM.    Final        Medical Problem List and Plan: 1.  Impaired function secondary to R MCA CVA  -patient may  shower  -ELOS/Goals: 10-14 days; has to be Mod I- going home alone; daughter has 3 children and cannot help much 2.  Antithrombotics: -DVT/anticoagulation:  Pharmaceutical: Lovenox  -antiplatelet therapy: DAPT X 3 months followed by ASA alone.  3. Pain Management: N/A 4. Mood: LCSW to follow for evaluation and support.   -antipsychotic agents: N/A 5. Neuropsych: This patient is capable of making decisions on his own behalf. 6. Skin/Wound Care: Routine pressure relief measures.  7. Fluids/Electrolytes/Nutrition:  Monitor I/O. Check lytes in am. 8. PVD s/p FPBG/multiple toe amp: Smokes 1-2 PPD. He has agreed to stop smoking/quit tobacco use. 9. Polycythemia: has been in 18,000 range since 2014 per records. Likely due to tobacco use. Continue to monitor.  10. HTN: Monitor BP tid--permissive HTN for a couple more days.  11. B12 deficiency: IM supplement on 03/17 and now on oral supplement.  12. Intermittent bradycardia: Reported to have HR in 30's on telemetry --recs for 30 day outpatient cardiac monitor.  13. Leucocytosis: Monitor for signs of infection. Recheck CBC in am.  14. L inattention- will see if possible to set room up to help 15. Dispo  -pt initially agreed to semi-private room; now says has to have private room-  will attempt to change tomorrow.     Genice Rouge, MD 01/30/2020

## 2020-01-30 NOTE — Progress Notes (Signed)
Inpatient Rehabilitation Admissions Coordinator  Asked by Nursing to speak with patient concerning his complaints of sharing this semiprivate room due to fecal odor. I assured patient that we do not have another bed to offer at this time and he can not return to previous acute bed. Offered patient mask and more EVS spray. He says he has no other option, but to go home. I explained that this would be him discharging AMA. I notified Dr. Berline Chough and Assistant Director of patient request.  Ottie Glazier, RN, MSN Rehab Admissions Coordinator [phone] 01/30/2020 3:25 PM

## 2020-01-30 NOTE — Progress Notes (Signed)
Patient arrived to unit A/O x4. Upon arrival patient voiced concerns about his semi-private room. Patient's roommate had a bowel movement and patient stated "This smell is unpleasant, If I can not be moved into a private room, I will call my daughter so I can leave". Staff sprayed deodorizer to eliminate smell.  Alerted the rehab admission coordinator, whom also went to speak with patient. Explained to patient that leaving would be considered AMA. Explained to patient that there are no other room available at this time. Patient still stated " I want to be moved into a private room". Spoke with patients daughter whom received a phone call from patient. Explained to daughter hat there are no other available rooms at this time. Daughter agreed that it was in the patients best interest to stay. Leane Para, LPN

## 2020-01-30 NOTE — Progress Notes (Signed)
Inpatient Rehabilitation Medication Review by a Pharmacist  A complete drug regimen review was completed for this patient to identify any potential clinically significant medication issues.  Clinically significant medication issues were identified:  no  Pharmacist comments: n/a  Time spent performing this drug regimen review (minutes):  10 minutes  Charlett Nose, PharmD  PGY1 Acute Care Pharmacy Resident 01/30/2020 5:22 PM

## 2020-01-30 NOTE — Discharge Summary (Signed)
Physician Discharge Summary  Cody Baird ZOX:[id number] DOB: Sep 16, 1961 DOA: 01/27/2020  PCP: Patient, No Pcp Per  Admit date: 01/27/2020 Discharge date: 01/30/2020  Admitted From: home Discharge disposition: CIR   Recommendations for Outpatient Follow-Up:   1. Patient being discharge to CIR 2. Follow up with neurology in 4 weeks as instructed   Discharge Diagnosis:   Principal Problem:   Stroke [facility]) Active Problems:   Essential hypertension   PVD (peripheral vascular disease) (HCC)   Tobacco abuse   Bradycardia   B12 deficiency    Discharge Condition: Improved.  Diet recommendation: Low sodium, heart healthy.    Wound care: None.  Code status: Full.   History of Present Illness:   Cody Baird is a 59 y.o. male with medical history significant of peripheral vascular disease, tobacco abuse, history of previous vascular disease, chronic low back pain and bronchitis who came to the ER 3/15 with left-sided weakness lasting about a month.  Patient said he went to bed around 9 PM without any problem.  He woke up that morning with left-sided weakness associated with slurred speech.  He also had worsening progressively through the day.  Was later brought to the ER where he was seen and evaluated.  He was seen to have facial droop and left arm weakness.  A code stroke was called.  Patient was outside the window for TPA administration.  Initial evaluation including head CT without contrast  negative for CVA.  Patient admitted to the hospital for full CVA work-up.     Hospital Course by Problem:   #1. Stroke. Patient with left-sided weakness and slurred speech. CT of the head did not show acute findings and CT angiogram was negative for large vessel occlusion. Patient was outside the window for IV TPA. MRI revealed multifocal acute/early subacute anterior right MCA infarct.evaluated by neurology who opine scattered right MCA territory infarct, embolic pattern,  etiology uncertain, concerning for large vessel source. Echocardiogram with an ejection fraction of 60 to 65%,lipid panel with cholesterol 206, triglycerides 154, HDL 31, LDL 144. Hemoglobin A1c 5.7. Plavix and statin and aspirin started.  Evaluated by physical therapy/Occupational Therapy/speech therapy who all recommends CIR.  Evaluated by CIR and deemed a good candidate and is being discharged for admission to CIR. -Stroke team recommendations include aspirin 325 mg and Plavix 75 mg daily for 3 months then aspirin alone. Also recommend 30-day cardiac event monitoring at discharge.   #2. Hypertension. Blood pressure elevated on presentation and remained on the high end of normal.  Has been noncompliant with antihypertensives in the past.  #3. Peripheral vascular disease. Stable at baseline. S/p 2014 endarterectomy of common femoral and right femoral to tbioperoneal trunk bypass. Multiple toe amputations as well.  Chart review indicates patient lost any follow-up. Continues to smoke  #4. Tobacco use. Cessation counseling offered.Nicotine patch declined  #5.  Bradycardia/sinus pause.  Asymptomatic.  TSH within the limits of normal.  EKG with sinus rhythm no acute abnormalities.  Not on any rate controlling medications. 30 day monitoring OP     Medical Consultants:   [facility]    Discharge Exam:   Vitals:   01/29/20 2346 01/30/20 0344  BP: (!) 153/85 (!) 156/95  Pulse: (!) 58 73  Resp:    Temp: (!) 97.2 F (36.2 C) (!) 97.5 F (36.4 C)  SpO2: 94% 97%   Vitals:   01/29/20 1807 01/29/20 1953 01/29/20 2346 01/30/20 0344  BP: (!) 145/91 (!) 160/95 Marland Kitchen)  153/85 (!) 156/95  Pulse: 64 67 (!) 58 73  Resp: 18     Temp: 98.3 F (36.8 C) (!) 97.5 F (36.4 C) (!) 97.2 F (36.2 C) (!) 97.5 F (36.4 C)  TempSrc: Oral Oral Oral Oral  SpO2: 96% 96% 94% 97%  Weight:      Height:        General exam: Appears calm and comfortable. No acute distress Respiratory system:  Clear to auscultation. Respiratory effort normal. Cardiovascular system: S1 & S2 heard, RRR. No JVD,  rubs, gallops or clicks. No murmurs. Multiple toe amputations, well healed Gastrointestinal system: Abdomen is nondistended, soft and nontender. No organomegaly or masses felt. Normal bowel sounds heard. Central nervous system: Alert and oriented. Improved left arm/hand movement/strenght. Speech clear.  Extremities: No clubbing,  or cyanosis. No edema. Skin: No rashes, lesions or ulcers. Psychiatry: Judgement and insight appear normal. Mood & affect appropriate.    The results of significant diagnostics from this hospitalization (including imaging, microbiology, ancillary and laboratory) are listed below for reference.     Procedures and Diagnostic Studies:   CT ANGIO HEAD W OR WO CONTRAST  Result Date: 01/27/2020 CLINICAL DATA:  Stroke, follow-up. Additional history provided: Facial droop, left arm weakness. EXAM: CT ANGIOGRAPHY HEAD AND NECK TECHNIQUE: Multidetector CT imaging of the head and neck was performed using the standard protocol during bolus administration of intravenous contrast. Multiplanar CT image reconstructions and MIPs were obtained to evaluate the vascular anatomy. Carotid stenosis measurements (when applicable) are obtained utilizing NASCET criteria, using the distal internal carotid diameter as the denominator. CONTRAST:  75mL OMNIPAQUE IOHEXOL 350 MG/ML SOLN COMPARISON:  Noncontrast head CT performed earlier the same day 01/27/2020. FINDINGS: CTA NECK FINDINGS Aortic arch: Standard aortic branching. The visualized aortic arch is unremarkable. No significant innominate or proximal subclavian artery stenosis. Right carotid system: CCA and ICA patent within the neck without stenosis. Tortuosity of the cervical ICA. Left carotid system: CCA and ICA patent within the neck without stenosis. Mild mixed plaque within the proximal ICA. Vertebral arteries: Left vertebral artery dominant.  The vertebral arteries are patent within the neck bilaterally without significant stenosis. Skeleton: No acute bony abnormality or aggressive osseous lesion. Cervical spondylosis. Most notably there is a C5-C6 disc bulge with associated endplate spurring and osteophyte ridge. Bilateral neural foraminal narrowing at least mild bony spinal canal stenosis at this level. Other neck: No neck mass or cervical lymphadenopathy. Upper chest: Centrilobular emphysema. Review of the MIP images confirms the above findings CTA HEAD FINDINGS Anterior circulation: The intracranial internal carotid arteries are patent without significant stenosis. There is a moderate to moderately advanced stenosis within the distal M1 right MCA (series 11, image 58) (series 6, image 289). No right M2 proximal branch occlusion is identified. The M1 left middle cerebral artery is patent without significant stenosis. No left M2 proximal branch occlusion is identified. The anterior cerebral arteries are patent without high-grade proximal stenosis. The A1 left ACA is dominant. There is an apparent high-grade stenosis within the A3-A4 left ACA (series 10, image 30). No intracranial aneurysm is identified. Posterior circulation: The intracranial internal carotid arteries are patent. Mixed plaque results in sites of up to moderate stenosis within the intracranial right vertebral artery. No more than mild atherosclerotic narrowing of the intracranial left vertebral artery. The basilar artery is patent with mild atherosclerotic irregularity. The posterior cerebral arteries are patent bilaterally without significant proximal stenosis. A left posterior communicating artery is present. A right posterior communicating artery is  not definitively identified. Venous sinuses: Within limitations of contrast timing, no convincing thrombus. Anatomic variants: As described. Review of the MIP images confirms the above findings These results were called by telephone at  the time of interpretation on 01/27/2020 at 10:20 pm to provider Arther Dames , who verbally acknowledged these results. IMPRESSION: CTA neck: 1. The bilateral common carotid, internal carotid and vertebral arteries are patent within the neck without significant stenosis. Mild mixed plaque within the proximal left ICA. 2. Pulmonary emphysema. CTA head: 1. No intracranial large vessel occlusion. 2. Atherosclerotic disease with multifocal stenoses, most notably as follows. 3. Moderate to moderately advanced stenosis within the distal M1 right MCA. 4. Apparent high-grade focal stenosis within an A3-A4 left ACA branch vessel. 5. Sites of up to moderate atherosclerotic narrowing within the intracranial right vertebral artery. Electronically Signed   By: Jackey Loge DO   On: 01/27/2020 22:20   CT ANGIO NECK W OR WO CONTRAST  Result Date: 01/27/2020 CLINICAL DATA:  Stroke, follow-up. Additional history provided: Facial droop, left arm weakness. EXAM: CT ANGIOGRAPHY HEAD AND NECK TECHNIQUE: Multidetector CT imaging of the head and neck was performed using the standard protocol during bolus administration of intravenous contrast. Multiplanar CT image reconstructions and MIPs were obtained to evaluate the vascular anatomy. Carotid stenosis measurements (when applicable) are obtained utilizing NASCET criteria, using the distal internal carotid diameter as the denominator. CONTRAST:  74mL OMNIPAQUE IOHEXOL 350 MG/ML SOLN COMPARISON:  Noncontrast head CT performed earlier the same day 01/27/2020. FINDINGS: CTA NECK FINDINGS Aortic arch: Standard aortic branching. The visualized aortic arch is unremarkable. No significant innominate or proximal subclavian artery stenosis. Right carotid system: CCA and ICA patent within the neck without stenosis. Tortuosity of the cervical ICA. Left carotid system: CCA and ICA patent within the neck without stenosis. Mild mixed plaque within the proximal ICA. Vertebral arteries: Left  vertebral artery dominant. The vertebral arteries are patent within the neck bilaterally without significant stenosis. Skeleton: No acute bony abnormality or aggressive osseous lesion. Cervical spondylosis. Most notably there is a C5-C6 disc bulge with associated endplate spurring and osteophyte ridge. Bilateral neural foraminal narrowing at least mild bony spinal canal stenosis at this level. Other neck: No neck mass or cervical lymphadenopathy. Upper chest: Centrilobular emphysema. Review of the MIP images confirms the above findings CTA HEAD FINDINGS Anterior circulation: The intracranial internal carotid arteries are patent without significant stenosis. There is a moderate to moderately advanced stenosis within the distal M1 right MCA (series 11, image 58) (series 6, image 289). No right M2 proximal branch occlusion is identified. The M1 left middle cerebral artery is patent without significant stenosis. No left M2 proximal branch occlusion is identified. The anterior cerebral arteries are patent without high-grade proximal stenosis. The A1 left ACA is dominant. There is an apparent high-grade stenosis within the A3-A4 left ACA (series 10, image 30). No intracranial aneurysm is identified. Posterior circulation: The intracranial internal carotid arteries are patent. Mixed plaque results in sites of up to moderate stenosis within the intracranial right vertebral artery. No more than mild atherosclerotic narrowing of the intracranial left vertebral artery. The basilar artery is patent with mild atherosclerotic irregularity. The posterior cerebral arteries are patent bilaterally without significant proximal stenosis. A left posterior communicating artery is present. A right posterior communicating artery is not definitively identified. Venous sinuses: Within limitations of contrast timing, no convincing thrombus. Anatomic variants: As described. Review of the MIP images confirms the above findings These results  were called by  telephone at the time of interpretation on 01/27/2020 at 10:20 pm to provider [facility] , who verbally acknowledged these results. IMPRESSION: CTA neck: 1. The bilateral common carotid, internal carotid and vertebral arteries are patent within the neck without significant stenosis. Mild mixed plaque within the proximal left ICA. 2. Pulmonary emphysema. CTA head: 1. No intracranial large vessel occlusion. 2. Atherosclerotic disease with multifocal stenoses, most notably as follows. 3. Moderate to moderately advanced stenosis within the distal M1 right MCA. 4. Apparent high-grade focal stenosis within an A3-A4 left ACA branch vessel. 5. Sites of up to moderate atherosclerotic narrowing within the intracranial right vertebral artery. Electronically Signed   By: Kellie Simmering DO   On: 01/27/2020 22:20   MR BRAIN WO CONTRAST  Result Date: 01/28/2020 CLINICAL DATA:  Left-sided weakness EXAM: MRI HEAD WITHOUT CONTRAST TECHNIQUE: Multiplanar, multiecho pulse sequences of the brain and surrounding structures were obtained without intravenous contrast. Coronal T2-weighted imaging and susceptibility weighted imaging were not acquired. COMPARISON:  CTA head 01/27/2020 FINDINGS: Brain: Multifocal abnormal diffusion restriction within the anterior right MCA territory, including along the right precentral gyrus and the right basal ganglia. No hemorrhage or mass effect. Multifocal white matter hyperintensity, most commonly due to chronic ischemic microangiopathy. Normal volume of CSF spaces. No chronic microhemorrhage. Normal midline structures. Vascular: Normal flow voids. Skull and upper cervical spine: Normal marrow signal. Sinuses/Orbits: Negative. Other: None. IMPRESSION: Multifocal acute/early subacute within the anterior right MCA territory. No hemorrhage or mass effect. Electronically Signed   By: Ulyses Jarred M.D.   On: 01/28/2020 01:39   ECHOCARDIOGRAM COMPLETE  Result Date: 01/28/2020     ECHOCARDIOGRAM REPORT   Patient Name:   ALDOUS A Langbehn Date of Exam: 01/28/2020 Medical Rec #:  [id number]    Height:       72.0 in Accession #:    [id number]   Weight:       195.0 lb Date of Birth:  1961/08/29    BSA:          2.108 m Patient Age:    64 years     BP:           135/95 mmHg Patient Gender: M            HR:           83 bpm. Exam Location:  Inpatient Procedure: 2D Echo and Intracardiac Opacification Agent Indications:    TIA 435.9 / G45.9  History:        Patient has prior history of Echocardiogram examinations, most                 recent 06/29/2013. PAD.  Sonographer:    Darlina Sicilian RDCS Referring Phys: [phone] [facility] G[facility]  Sonographer Comments: Technically difficult study due to poor echo windows. IMPRESSIONS  1. Left ventricular ejection fraction, by estimation, is 60 to 65%. The left ventricle has normal function. The left ventricle has no regional wall motion abnormalities. Left ventricular diastolic function could not be evaluated.  2. Right ventricular systolic function was not well visualized. The right ventricular size is not well visualized.  3. The mitral valve is grossly normal. No evidence of mitral valve regurgitation.  4. The aortic valve was not well visualized. Aortic valve regurgitation is not visualized.  5. Very limited echo, however, LV function appears normal. FINDINGS  Left Ventricle: Left ventricular ejection fraction, by estimation, is 60 to 65%. The left ventricle has normal function. The left ventricle has no regional  wall motion abnormalities. Definity contrast agent was given IV to delineate the left ventricular  endocardial borders. The left ventricular internal cavity size was normal in size. There is no left ventricular hypertrophy. Left ventricular diastolic function could not be evaluated. Right Ventricle: The right ventricular size is not well visualized. Right vetricular wall thickness was not assessed. Right ventricular systolic function was not well visualized.  Left Atrium: Left atrial size was normal in size. Right Atrium: Right atrial size was normal in size. Pericardium: There is no evidence of pericardial effusion. Mitral Valve: The mitral valve is grossly normal. No evidence of mitral valve regurgitation. Tricuspid Valve: The tricuspid valve is not well visualized. Tricuspid valve regurgitation is not demonstrated. Aortic Valve: The aortic valve was not well visualized. Aortic valve regurgitation is not visualized. Pulmonic Valve: The pulmonic valve was not well visualized. Pulmonic valve regurgitation is not visualized. Aorta: The aortic root was not well visualized. Venous: The inferior vena cava was not well visualized. IAS/Shunts: The interatrial septum was not well visualized.  LEFT VENTRICLE PLAX 2D LVIDd:         5.60 cm  Diastology LVIDs:         3.30 cm  LV e' lateral:   8.27 cm/s LV PW:         1.10 cm  LV E/e' lateral: 6.9 LV IVS:        1.10 cm  LV e' medial:    7.94 cm/s LVOT diam:     2.10 cm  LV E/e' medial:  7.2 LVOT Area:     3.46 cm  MITRAL VALVE MV Area (PHT): 5.02 cm    SHUNTS MV Decel Time: 151 msec    Systemic Diam: 2.10 cm MV E velocity: 56.80 cm/s MV A velocity: 39.90 cm/s MV E/A ratio:  1.42 Zoila Shutter MD Electronically signed by Zoila Shutter MD Signature Date/Time: 01/28/2020/11:11:55 AM    Final    CT HEAD CODE STROKE WO CONTRAST  Result Date: 01/27/2020 CLINICAL DATA:  Code stroke. Facial droop; left arm weakness, last known well 2300 yesterday. EXAM: CT HEAD WITHOUT CONTRAST TECHNIQUE: Contiguous axial images were obtained from the base of the skull through the vertex without intravenous contrast. COMPARISON:  No pertinent prior studies available for comparison. FINDINGS: Brain: There is no evidence of acute intracranial hemorrhage, intracranial mass, midline shift or extra-axial fluid collection.No demarcated cortical infarction. Cerebral volume is normal for age. Vascular: No hyperdense vessel. Skull: Normal. Negative for  fracture or focal lesion. Sinuses/Orbits: Visualized orbits demonstrate no acute abnormality. Mild ethmoid sinus mucosal thickening. No significant mastoid effusion. These results were called by telephone at the time of interpretation on 01/27/2020 at 9:36 pm to provider Dr. Laurence Slate, who verbally acknowledged these results. IMPRESSION: No CT evidence of acute intracranial abnormality. Electronically Signed   By: Jackey Loge DO   On: 01/27/2020 21:37   VAS [place] LOWER EXTREMITY VENOUS (DVT)  Result Date: 01/28/2020  Lower Venous DVTStudy Indications: Stroke.  Comparison Study: No prior exam. Performing Technologist: Kennedy Bucker ARDMS, RVT  Examination Guidelines: A complete evaluation includes B-mode imaging, spectral Doppler, color Doppler, and power Doppler as needed of all accessible portions of each vessel. Bilateral testing is considered an integral part of a complete examination. Limited examinations for reoccurring indications may be performed as noted. The reflux portion of the exam is performed with the patient in reverse Trendelenburg.  +---------+---------------+---------+-----------+----------+--------------+ RIGHT    CompressibilityPhasicitySpontaneityPropertiesThrombus Aging +---------+---------------+---------+-----------+----------+--------------+ CFV      Full  Yes      Yes                                 +---------+---------------+---------+-----------+----------+--------------+ SFJ      Full                                                        +---------+---------------+---------+-----------+----------+--------------+ FV Prox  Full                                                        +---------+---------------+---------+-----------+----------+--------------+ FV Mid   Full                                                        +---------+---------------+---------+-----------+----------+--------------+ FV DistalFull                                                         +---------+---------------+---------+-----------+----------+--------------+ PFV      Full                                                        +---------+---------------+---------+-----------+----------+--------------+ POP      Full           Yes      Yes                                 +---------+---------------+---------+-----------+----------+--------------+ PTV      Full                                                        +---------+---------------+---------+-----------+----------+--------------+ PERO     Full                                                        +---------+---------------+---------+-----------+----------+--------------+   +---------+---------------+---------+-----------+----------+--------------+ LEFT     CompressibilityPhasicitySpontaneityPropertiesThrombus Aging +---------+---------------+---------+-----------+----------+--------------+ CFV      Full           Yes      Yes                                 +---------+---------------+---------+-----------+----------+--------------+ SFJ  Full                                                        +---------+---------------+---------+-----------+----------+--------------+ FV Prox  Full                                                        +---------+---------------+---------+-----------+----------+--------------+ FV Mid   Full                                                        +---------+---------------+---------+-----------+----------+--------------+ FV DistalFull                                                        +---------+---------------+---------+-----------+----------+--------------+ PFV      Full                                                        +---------+---------------+---------+-----------+----------+--------------+ POP      Full           Yes      Yes                                  +---------+---------------+---------+-----------+----------+--------------+ PTV      Full                                                        +---------+---------------+---------+-----------+----------+--------------+ PERO     Full                                                        +---------+---------------+---------+-----------+----------+--------------+     Summary: BILATERAL: - No evidence of deep vein thrombosis seen in the lower extremities, bilaterally.  RIGHT: - No cystic structure found in the popliteal fossa.  LEFT: - No cystic structure found in the popliteal fossa.  *See table(s) above for measurements and observations. Electronically signed by Coral Else MD on 01/28/2020 at 9:42:45 PM.    Final      Labs:   Basic Metabolic Panel: Recent Labs  Lab 01/27/20 2126 01/27/20 2133  NA 140 140  K 4.3 3.9  CL 106 106  CO2 22  --   GLUCOSE 134* 131*  BUN 9 11  CREATININE 1.03 1.00  CALCIUM 9.4  --  GFR Estimated Creatinine Clearance: 89.5 mL/min (by C-G formula based on SCr of 1 mg/dL). Liver Function Tests: Recent Labs  Lab 01/27/20 2126  AST 18  ALT 17  ALKPHOS 64  BILITOT 0.5  PROT 7.7  ALBUMIN 4.1   No results for input(s): LIPASE, AMYLASE in the last 168 hours. No results for input(s): AMMONIA in the last 168 hours. Coagulation profile Recent Labs  Lab 01/27/20 2126  INR 1.0    CBC: Recent Labs  Lab 01/27/20 2126 01/27/20 2133  WBC 11.1*  --   NEUTROABS 8.3*  --   HGB 17.3* 18.4*  HCT 54.7* 54.0*  MCV 94.6  --   PLT 223  --    Cardiac Enzymes: No results for input(s): CKTOTAL, CKMB, CKMBINDEX, TROPONINI in the last 168 hours. BNP: Invalid input(s): POCBNP CBG: Recent Labs  Lab 01/27/20 2123  GLUCAP 128*   D-Dimer No results for input(s): DDIMER in the last 72 hours. Hgb A1c Recent Labs    01/28/20 0612  HGBA1C 5.7*   Lipid Profile Recent Labs    01/28/20 0612  CHOL 206*  HDL 31*  LDLCALC 144*  TRIG 154*    CHOLHDL 6.6   Thyroid function studies Recent Labs    01/28/20 1041  TSH 1.784   Anemia work up Recent Labs    01/28/20 1041  VITAMINB12 156*   Microbiology Recent Results (from the past 240 hour(s))  SARS Coronavirus 2 by RT PCR     Status: None   Collection Time: 01/28/20  2:18 AM  Result Value Ref Range Status   SARS Coronavirus 2 NEGATIVE NEGATIVE Final    Comment: (NOTE) Result indicates the ABSENCE of SARS-CoV-2 RNA in the patient specimen.  The lowest concentration of SARS-CoV-2 viral copies this assay can detect in nasopharyngeal swab specimens is 500 copies / mL.  A negative result does not preclude SARS-CoV-2 infection and should not be used as the sole basis for patient management decisions. A negative result may occur with improper specimen collection / handling, submission of a specimen other than nasopharyngeal swab, presence of viral mutation(s) within the areas targeted by this assay, and inadequate number of viral copies (<500 copies / mL) present.  Negative results must be combined with clinical observations, patient history, and epidemiological information.  The expected result is NEGATIVE.  Patient Fact Sheet:  https://wong-henderson.biz/https://www.fda.gov/media/136287/download   Provider Fact Sheet:  CheapJackpot.athttps://www.fda.gov/media/136285/download   This test is not yet approved or cleared by the [place] FDA and  has been authorized for  detection and/or diagnosis of SARS-CoV-2 by FDA under an Emergency Use Authorization (EUA).  This EUA will remain in effect (meaning this test can be used) for the duration of  the COVID-19 declaration under Section 564(b)(1) of the Act, 21 U.S.C. section 360bbb-3(b)(1), unless the authorization is terminated or revoked sooner Performed at [facility], [address] [place], [place] [address]      Discharge Instructions:   Discharge Instructions    Ambulatory referral to Neurology   Complete by: As directed    Follow up  with stroke clinic NP (Jessica Vanschaick or Darrol Angelarolyn Martin, if both not available, consider Manson AllanSethi, Penumali, or Ahern) at [facility] in about 4 weeks. Thanks.   Diet - low sodium heart healthy   Complete by: As directed    Discharge instructions   Complete by: As directed    Take medications as prescribed Follow up with neurology as instructed   Increase activity slowly   Complete  by: As directed      Allergies as of 01/30/2020      Reactions   Penicillins Other (See Comments)   Childhood allergy not sure what the reaction is  Did it involve swelling of the face/tongue/throat, SOB, or low BP? Unknown Did it involve sudden or severe rash/hives, skin peeling, or any reaction on the inside of your mouth or nose? Unknown Did you need to seek medical attention at a hospital or doctor's office? Unknown When did it last happen?childhood If all above answers are "NO", may proceed with cephalosporin use.      Medication List    TAKE these medications    stroke: mapping our early stages of recovery book Misc 1 each by Does not apply route once for 1 dose.   aspirin 325 MG EC tablet Take 1 tablet (325 mg total) by mouth daily. Start taking on: January 31, 2020   atorvastatin 40 MG tablet Commonly known as: LIPITOR Take 1 tablet (40 mg total) by mouth daily at 6 PM.   clopidogrel 75 MG tablet Commonly known as: PLAVIX Take 1 tablet (75 mg total) by mouth daily. Take one tab daily for 90 days then stop Start taking on: January 31, 2020   cyanocobalamin 1000 MCG tablet Take 1 tablet (1,000 mcg total) by mouth daily. Start taking on: January 31, 2020   senna-docusate 8.6-50 MG tablet Commonly known as: Senokot-S Take 1 tablet by mouth at bedtime as needed for moderate constipation.      Follow-up Information    [facility]. Schedule an appointment as soon as possible for a visit in 4 week(s).   Specialty: Neurology Contact information: [address] [place] [address] [phone]           Time coordinating discharge: 40 minutes  Signed:  Gwenyth Bender NP  [facility] 01/30/2020, 11:29 AM

## 2020-01-30 NOTE — Progress Notes (Signed)
Occupational Therapy Treatment Patient Details Name: Cody Baird MRN: [id number] DOB: [birth year] Today's Date: 01/30/2020    History of present illness Cody Baird is a 59 y.o. male with peripheral artery disease who does not seek medical care presents to the emergency department for approximately 1 day history of left-sided weakness.  PMH positive for 2 back surgeries, PVD, tobacco use, R LE femoral endarterectomy/thrombectomy/bypass and 3 toes ampuated.   OT comments  Pt with decreased activity tolerance, was a heavy smoker PTA. Pt needing cues to incorporate use of L UE to open toothpaste, deodorant and to squeeze out wash cloth during ADL training. Stood to urinate with RW straddling toilet with one hand support on walker and min guard assist. Pt is highly motivated with good potential to return to modified independence with intensive rehab.   Follow Up Recommendations  CIR    Equipment Recommendations  3 in 1 bedside commode    Recommendations for Other Services      Precautions / Restrictions Precautions Precautions: Fall Precaution Comments: L side weakness Restrictions Weight Bearing Restrictions: No       Mobility Bed Mobility Overal bed mobility: Needs Assistance Bed Mobility: Supine to Sit;Sit to Supine     Supine to sit: Supervision Sit to supine: Supervision   General bed mobility comments: increased time  Transfers Overall transfer level: Needs assistance Equipment used: Rolling walker (2 wheeled) Transfers: Sit to/from Stand Sit to Stand: Min guard;From elevated surface         General transfer comment: good technique    Balance Overall balance assessment: Needs assistance   Sitting balance-Leahy Scale: Fair       Standing balance-Leahy Scale: Poor Standing balance comment: reliant on at least one hand support in static standing                           ADL either performed or assessed with clinical judgement   ADL Overall  ADL's : Needs assistance/impaired     Grooming: Sitting;Oral care;Wash/dry hands;Wash/dry face;Minimal assistance   Upper Body Bathing: Moderate assistance;Sitting Upper Body Bathing Details (indicate cue type and reason): assisted to wash back     Upper Body Dressing : Minimal assistance;Sitting       Toilet Transfer: Ambulation;RW;Min guard Toilet Transfer Details (indicate cue type and reason): stood to urinate Toileting- Architect and Hygiene: Min guard       Functional mobility during ADLs: Minimal assistance;Rolling walker;Cueing for safety       Vision       Perception     Praxis      Cognition Arousal/Alertness: Awake/alert Behavior During Therapy: WFL for tasks assessed/performed Overall Cognitive Status: Impaired/Different from baseline Area of Impairment: Safety/judgement                         Safety/Judgement: Decreased awareness of safety;Decreased awareness of deficits              Exercises     Shoulder Instructions       General Comments      Pertinent Vitals/ Pain       Pain Assessment: No/denies pain  Home Living                                          Prior Functioning/Environment  Frequency  Min 2X/week        Progress Toward Goals  OT Goals(current goals can now be found in the care plan section)  Progress towards OT goals: Progressing toward goals  Acute Rehab OT Goals Patient Stated Goal: to return to independence OT Goal Formulation: With patient Time For Goal Achievement: 02/11/20 Potential to Achieve Goals: Good  Plan Discharge plan remains appropriate    Co-evaluation                 AM-PAC OT "6 Clicks" Daily Activity     Outcome Measure   Help from another person eating meals?: A Little Help from another person taking care of personal grooming?: A Little Help from another person toileting, which includes using toliet, bedpan, or  urinal?: A Little Help from another person bathing (including washing, rinsing, drying)?: A Lot Help from another person to put on and taking off regular upper body clothing?: A Little Help from another person to put on and taking off regular lower body clothing?: A Lot 6 Click Score: 16    End of Session Equipment Utilized During Treatment: Gait belt;Rolling walker  OT Visit Diagnosis: Unsteadiness on feet (R26.81);Other abnormalities of gait and mobility (R26.89);Muscle weakness (generalized) (M62.81);Other symptoms and signs involving cognitive function;Hemiplegia and hemiparesis Hemiplegia - Right/Left: Left Hemiplegia - dominant/non-dominant: Non-Dominant Hemiplegia - caused by: Cerebral infarction   Activity Tolerance Patient limited by fatigue   Patient Left in bed;with call bell/phone within reach   Nurse Communication          Time: 1607-3710 OT Time Calculation (min): 35 min  Charges: OT General Charges $OT Visit: 1 Visit OT Treatments $Self Care/Home Management : 23-37 mins  Nestor Lewandowsky, OTR/L Acute Rehabilitation Services Pager: [phone] Office: [phone]   Malka So 01/30/2020, 9:52 AM

## 2020-01-30 NOTE — Progress Notes (Signed)
Inpatient Rehabilitation Admissions Coordinator  I have a CIR bed available today . I met with patient and he is in agreement to admit to semiprivate room today. I contacted Dyanne Carrel NP and she is in agreement to d/c I will make the arrangements to admit today. I have alerted SW, [place] and RN [employer].   Danne Baxter, RN, MSN Rehab Admissions Coordinator [phone] 01/30/2020 11:05 AM

## 2020-01-30 NOTE — Progress Notes (Signed)
Standley Brooking, RN  Rehab Admission Coordinator  Physical Medicine and Rehabilitation  PMR Pre-admission  Signed  Date of Service:  01/30/2020 12:34 PM      Related encounter: ED to Hosp-Admission (Discharged) from 01/27/2020 in [facility] 5C OBSERVATION UNIT      Signed        Show:Clear all [x] Manual[x] Template[x] Copied  Added by: [x] , RN  [] Hover for details PMR Admission Coordinator Pre-Admission Assessment   Patient: Cody Baird is an 59 y.o., male MRN: DOB: 02/12/1961 Height: 6' (182.9 cm) Weight: 88.5 kg                                                                                                                                                  Insurance Information HMO:     PPO:      PCP:      IPA:      80/20:      OTHER:  PRIMARY: uninsured       I spoke with financial counselor, 59 at [phone] and she is assessing for long term disability   Medicaid Application Date:       Case Manager:  Disability Application Date:       Case Worker:    The Data Collection Information Summary for patients in Inpatient Rehabilitation Facilities with attached Privacy Act Statement-Health Care Records was provided and verbally reviewed with: N/A   Emergency Contact Information Contact Information     Name Relation Home Work Mobile    Potteiger,Myrtle Mother [phone]        Ural, Acree Daughter     [phone]       Current Medical History  Patient Admitting Diagnosis: CVA  History of Present Illness Cody Baird is a 59 year old male with history of PAD/PVD, ongoing tobacco use 2 PPD, chronic back pain who was admitted on 01/27/20 with progressive left sided weakness and slurred speech. CTA head/neck showed moderate to moderately advanced stenosis distal M1 right MCA and high grade focal stenosis A3-A4 left ACA branch as well as pulmonary emphysema. MRI brain showed multifocal acute/subacute infarcts within anterior right  MCA territory. 2D echo showed EF 60-65% without wall abnormality. Workup also revealed persistent polycythemia and low vitamin B12 levels. TEE was negative for PFO but showed small insignificant pulmonary shunt.  BLE dopplers negative for DVT.  He has had asymptomatic bradycardia with 3.5 second pause and stroke felt to be embolic due to uncertain etiology. 30 day cardiac monitor recommended as outpatient to rule out A fib. Dr. [name] recommends DAPT x 3 months followed by ASA alone. Therapy ongoing and patient continues to have limitations in mobility and ADLs due to left sided weakness.    Complete NIHSS TOTAL: 1 Glasgow Coma Scale Score: 15   Past Medical History  Past Medical History:  Diagnosis Date   Arthritis      low back and left hip   B12 deficiency     Bradycardia     Chronic back pain     Headache(784.0)      occasionally and sinus related    History of bronchitis      last time about 2-5yrs ago   PAD (peripheral artery disease) (HCC)     Peripheral vascular disease (HCC)     Prolapsed, anus      hx of   Stroke ([place]) 01/2020      Family History  family history includes Cancer in his mother; Diabetes in his father and mother; Heart attack in his father; Hyperlipidemia in his father; Hypertension in his father.   Prior Rehab/Hospitalizations:  Has the patient had prior rehab or hospitalizations prior to admission? Yes   Has the patient had major surgery during 100 days prior to admission? No   Current Medications    Current Facility-Administered Medications:     stroke: mapping our early stages of recovery book, , Does not apply, Once, Garba, Mohammad L, MD   0.9 %  sodium chloride infusion, , Intravenous, Continuous, Elwyn Reach, MD, Stopped at 01/30/20 1106   acetaminophen (TYLENOL) tablet 650 mg, 650 mg, Oral, Q4H PRN **OR** acetaminophen (TYLENOL) 160 MG/5ML solution 650 mg, 650 mg, Per Tube, Q4H PRN **OR** acetaminophen (TYLENOL) suppository 650  mg, 650 mg, Rectal, Q4H PRN, Jonelle Sidle, Mohammad L, MD   aspirin EC tablet 325 mg, 325 mg, Oral, Daily, Garba, Mohammad L, MD, 325 mg at 01/30/20 0834   atorvastatin (LIPITOR) tablet 40 mg, 40 mg, Oral, q1800, Jonelle Sidle, Mohammad L, MD, 40 mg at 01/29/20 1624   clopidogrel (PLAVIX) tablet 75 mg, 75 mg, Oral, Daily, Rosalin Hawking, MD, 75 mg at 01/30/20 0834   enoxaparin (LOVENOX) injection 40 mg, 40 mg, Subcutaneous, Q24H, Garba, Mohammad L, MD, 40 mg at 01/30/20 0834   senna-docusate (Senokot-S) tablet 1 tablet, 1 tablet, Oral, QHS PRN, Jonelle Sidle, Mohammad L, MD   sodium chloride flush (NS) 0.9 % injection 3 mL, 3 mL, Intravenous, Once, Isla Pence, MD   vitamin B-12 (CYANOCOBALAMIN) tablet 1,000 mcg, 1,000 mcg, Oral, Daily, Rosalin Hawking, MD, 1,000 mcg at 01/30/20 6144   Patients Current Diet:     Diet Order                      Diet - low sodium heart healthy           Diet Heart Room service appropriate? Yes; Fluid consistency: Thin  Diet effective now                   Precautions / Restrictions Precautions Precautions: Fall Precaution Comments: L side weakness Restrictions Weight Bearing Restrictions: No    Has the patient had 2 or more falls or a fall with injury in the past year?No   Prior Activity Level Community (5-7x/wk): Independent   Prior Functional Level Prior Function Level of Independence: Independent Comments: independent with ADLs/selfcare, IADLs, home mgt, was working, does not drive   Self Care: Did the patient need help bathing, dressing, using the toilet or eating?  Independent   Indoor Mobility: Did the patient need assistance with walking from room to room (with or without device)? Independent   Stairs: Did the patient need assistance with internal or external stairs (with or without device)? Independent   Functional Cognition: Did the patient need help  planning regular tasks such as shopping or remembering to take medications? Independent   Home  Assistive Devices / Equipment Home Assistive Devices/Equipment: None Home Equipment: None   Prior Device Use: Indicate devices/aids used by the patient prior to current illness, exacerbation or injury? cane   Current Functional Level Cognition   Overall Cognitive Status: Impaired/Different from baseline Current Attention Level: Sustained Orientation Level: Oriented X4 Following Commands: Follows one step commands consistently, Follows one step commands with increased time Safety/Judgement: Decreased awareness of safety, Decreased awareness of deficits General Comments: needs PT cuing to call attention to deficits, especially during ambulation Attention: Sustained, Selective Sustained Attention: Impaired Sustained Attention Impairment: Verbal complex Memory: Impaired Memory Impairment: Retrieval deficit(4/5 words recalled independently, 1/5 with multiple choice cue) Awareness: Impaired Awareness Impairment: Intellectual impairment(decreased awareness to leaning to the left until made aware) Problem Solving: Appears intact    Extremity Assessment (includes Sensation/Coordination)   Upper Extremity Assessment: LUE deficits/detail LUE Deficits / Details: AROM impaired, AAROM WFL, strength shoulder flexion 3-/5, elbow extension/flexion 3-/5, zero grip LUE Sensation: decreased light touch  Lower Extremity Assessment: LLE deficits/detail, RLE deficits/detail RLE Deficits / Details: AROM/strength WFL, numbness/burning/tingling in foot h/o neuropathy RLE Sensation: decreased light touch, history of peripheral neuropathy LLE Deficits / Details: AROM WFL, strength hip flexion 4-/5, knee extension 4/5, ankle DF 2/5     ADLs   Overall ADL's : Needs assistance/impaired Eating/Feeding: Set up, Sitting Grooming: Sitting, Oral care, Wash/dry hands, Wash/dry face, Minimal assistance Upper Body Bathing: Moderate assistance, Sitting Upper Body Bathing Details (indicate cue type and reason):  assisted to wash back Lower Body Bathing: Total assistance Upper Body Dressing : Minimal assistance, Sitting Lower Body Dressing: Total assistance Toilet Transfer: Ambulation, RW, Min guard Toilet Transfer Details (indicate cue type and reason): stood to urinate Toileting- Architect and Hygiene: Min guard Functional mobility during ADLs: Minimal assistance, Rolling walker, Cueing for safety General ADL Comments: pt sat EOB x 10 minutes. Pt with Poor sitting balance and required min A for balance/support     Mobility   Overal bed mobility: Needs Assistance Bed Mobility: Supine to Sit, Sit to Supine Supine to sit: Supervision Sit to supine: Supervision General bed mobility comments: increased time     Transfers   Overall transfer level: Needs assistance Equipment used: Rolling walker (2 wheeled) Transfers: Sit to/from Stand Sit to Stand: Min guard, From elevated surface General transfer comment: good technique     Ambulation / Gait / Stairs / Wheelchair Mobility   Ambulation/Gait Ambulation/Gait assistance: Mod assist Gait Distance (Feet): 25 Feet(2x25 ft) Assistive device: Rolling walker (2 wheeled) Gait Pattern/deviations: Step-to pattern, Step-through pattern, Decreased stride length, Decreased dorsiflexion - left, Wide base of support, Shuffle General Gait Details: Mod assist for directing pt and RW as pt with L lateral leaning and preference, steadying. Verbal cuing for placement in RW, L foot flat during stance as pt with preference for WB through lateral portion of foot in signficant inversion. Seated rest break x2 minutes Gait velocity: decr     Posture / Balance Dynamic Sitting Balance Sitting balance - Comments: No L lateral leaning in sitting this session, practiced L lateral scooting at EOB, static sitting. Sat EOB both static and dynamic x10 minutes Balance Overall balance assessment: Needs assistance Sitting-balance support: Feet supported Sitting  balance-Leahy Scale: Fair Sitting balance - Comments: No L lateral leaning in sitting this session, practiced L lateral scooting at EOB, static sitting. Sat EOB both static and dynamic x10 minutes Postural control: Left  lateral lean Standing balance support: Bilateral upper extremity supported Standing balance-Leahy Scale: Poor Standing balance comment: reliant on at least one hand support in static standing     Special needs/care consideration BiPAP/CPAP n/a CPM n/a Continuous Drip IV n/a Dialysis n/a Life Vest n/a Oxygen n/a Special Bed n/a Trach Size n/a Wound Vac n/a Skin intact Bowel mgmt: continent Bladder mgmt: continent Diabetic mgmt Hgb A1c 5.7 Behavioral consideration  N/a Chemo/radiation  N/a Designated visitor is daughter, Morrie Sheldon    Previous Home Environment  Living Arrangements: Alone  Lives With: Alone Available Help at Discharge: Friend(s), Available PRN/intermittently Type of Home: Apartment Home Layout: One level Home Access: Stairs to enter Entrance Stairs-Rails: Right Entrance Stairs-Number of Steps: 2-3 Bathroom Shower/Tub: Engineer, manufacturing systems: Standard Bathroom Accessibility: Yes How Accessible: Accessible via walker Home Care Services: No   Discharge Living Setting Plans for Discharge Living Setting: Patient's home, Apartment, Alone Type of Home at Discharge: Apartment Discharge Home Layout: One level Discharge Home Access: Stairs to enter Entrance Stairs-Rails: Right Entrance Stairs-Number of Steps: 2 to 3 Discharge Bathroom Shower/Tub: Tub/shower unit Discharge Bathroom Toilet: Standard Discharge Bathroom Accessibility: Yes How Accessible: Accessible via walker Does the patient have any problems obtaining your medications?: Yes (Describe)(uninsured)   Social/Family/Support Systems Patient Roles: Parent Contact Information: daughter, Morrie Sheldon Anticipated Caregiver: friends prn Anticipated Caregiver's Contact Information: see  above Caregiver Availability: Intermittent Discharge Plan Discussed with Primary Caregiver: Yes Is Caregiver In Agreement with Plan?: Yes Does Caregiver/Family have Issues with Lodging/Transportation while Pt is in Rehab?: No   Goals/Additional Needs Patient/Family Goal for Rehab: Mod I with PT, OT, and SLP Expected length of stay: ELOS 10 to 14 days Pt/Family Agrees to Admission and willing to participate: Yes Program Orientation Provided & Reviewed with Pt/Caregiver Including Roles  & Responsibilities: Yes   Decrease burden of Care through IP rehab admission: n/a   Possible need for SNF placement upon discharge: n/a   Patient Condition: This patient's condition remains as documented in the consult dated 01/28/2020, in which the Rehabilitation Physician determined and documented that the patient's condition is appropriate for intensive rehabilitative care in an inpatient rehabilitation facility. Will admit to inpatient rehab today.   Preadmission Screen Completed By:  Clois Dupes, RN, 01/30/2020 12:34 PM ______________________________________________________________________   Discussed status with Dr. Berline Chough on 01/20/2020 at  1240 and received approval for admission today.   Admission Coordinator:  Clois Dupes, time 1240 on 01/20/2020         Cosigned by: Genice Rouge, MD at 01/30/2020 12:41 PM  Revision History

## 2020-01-30 NOTE — Progress Notes (Signed)
Genice Rouge, MD  Physician  Physical Medicine and Rehabilitation  Consult Note      Signed  Date of Service:  01/28/2020 12:06 PM      Related encounter: ED to Hosp-Admission (Discharged) from 01/27/2020 in [facility] OBSERVATION UNIT      Signed      Expand AllCollapse All   Show:Clear all [x] Manual[x] Template[] Copied  Added by: [x] Love, , PA-C[x] , MD  [] Hover for details          Physical Medicine and Rehabilitation Consult     Reason for Consult: Stroke with functional deficits.  Referring Physician: Dr. Evlyn Kanner     HPI: Cody Baird is a 59 y.o. male with history of PVD, PAD, chronic low back pain, ongoing tobacco use--2 PPD;  who was admitted on last night with reports of progressive left-sided weakness and slurred speech throughout the day.  CT head was negative for acute findings.  CTA head/neck showed moderate to moderately advanced stenosis within the distal M1 right MCA and high-grade focal stenosis A3-A4 left ACA branch with mixed plaque within proximal left ICA.  Incidental note made of pulmonary emphysema.  MRI brain showed multifocal acute/subacute within anterior right MCA territory.  2D echo showed EF of 60 to 65% without wall abnormality.  Labs showed evidence of polycythemia and vitamin B12 levels low. Stroke felt to embolic due to unknown source and work up underway. DAPT X 3 months followed by ASA recommended given high grade intracranial stenosis.   Pt reports very sedated and cannot stay awake.  Does note "LUE won't do right" and it will shake when he stretches. Pt also notes his colostomy that he had previously was reversed.    Per his daughter, he doesn't drive- and his mother is in poor health- pt's daughter works full time and lives in [place], pt in [place], 59 and daughter reports hx of frequent falls due to poor balance, R toes removal.        Review of Systems  Constitutional: Negative for chills and fever.  HENT:  Negative for hearing loss and tinnitus.   Eyes: Negative for blurred vision and double vision.  Respiratory: Negative for cough and shortness of breath.   Cardiovascular: Negative for chest pain and palpitations.  Gastrointestinal: Negative for abdominal pain, heartburn and nausea.  Genitourinary: Negative for dysuria and urgency.  Musculoskeletal: Negative for back pain and myalgias.  Skin: Negative for itching and rash.  Neurological: Positive for sensory change (sensory deficits), focal weakness and weakness. Negative for dizziness and headaches.  Psychiatric/Behavioral: The patient is not nervous/anxious and does not have insomnia.           Past Medical History:  Diagnosis Date  . Arthritis      low back and left hip  . Chronic back pain    . Headache(784.0)      occasionally and sinus related   . History of bronchitis      last time about 2-65yrs ago  . PAD (peripheral artery disease) (HCC)    . Peripheral vascular disease (HCC)    . Prolapsed, anus      hx of           Past Surgical History:  Procedure Laterality Date  . ABDOMINAL AORTAGRAM N/A 06/26/2013    Procedure: ABDOMINAL AORTAGRAM;  Surgeon: [place], MD;  Location: [facility] CATH LAB;  Service: Cardiovascular;  Laterality: N/A;  . AMPUTATION Right 12/04/2013    Procedure: AMPUTATION DIGIT- 3RD TOE  RIGHT FOOT ;  Surgeon: Larina Earthly, MD;  Location: [facility] OR;  Service: Vascular;  Laterality: Right;  . AMPUTATION Right 01/15/2014    Procedure: AMPUTATION OF RIGHT 4TH and 5TH TOES;  Surgeon: Larina Earthly, MD;  Location: [facility] OR;  Service: Vascular;  Laterality: Right;  . BACK SURGERY        L1-2  . BACK SURGERY        L1-2  . BACK SURGERY        thinks L1- 2  . COLON SURGERY        removal of colon due to proplase rectum  . COLOSTOMY      . COLOSTOMY CLOSURE   2006ish  . ESOPHAGOGASTRODUODENOSCOPY      . FEMORAL-TIBIAL BYPASS GRAFT Right 06/28/2013    Procedure: BYPASS GRAFT FEMORAL TO TIBIAL PERONEAL TRUNK ARTERY;   Surgeon: Larina Earthly, MD;  Location: [facility] OR;  Service: Vascular;  Laterality: Right;           Family History  Problem Relation Age of Onset  . Cancer Mother    . Diabetes Mother    . Diabetes Father    . Hyperlipidemia Father    . Hypertension Father    . Heart attack Father        Social History:  Lives alone--used to work in [employer]. Has not worked for past 3-4 years and reports that he "does nothing" during the day.   He reports that he has been smoking cigarettes 2PPD currently. Has smoked for 40 years.  He has never used smokeless tobacco. He reports that he does not drink alcohol or use drugs.           Allergies  Allergen Reactions  . Penicillins Other (See Comments)      Childhood allergy not sure what the reaction is  Did it involve swelling of the face/tongue/throat, SOB, or low BP? Unknown Did it involve sudden or severe rash/hives, skin peeling, or any reaction on the inside of your mouth or nose? Unknown Did you need to seek medical attention at a hospital or doctor's office? Unknown When did it last happen?    childhood   If all above answers are "NO", may proceed with cephalosporin use.      No medications prior to admission.      Home: Home Living Family/patient expects to be discharged to:: Private residence Living Arrangements: Alone Available Help at Discharge: Friend(s), Available PRN/intermittently Type of Home: Apartment Home Access: Stairs to enter [employer] of Steps: 2-3 Entrance Stairs-Rails: Right Home Layout: One level Bathroom Shower/Tub: Engineer, manufacturing systems: (comfort) Home Equipment: None  Functional History: Prior Function Level of Independence: Independent Comments: works Dance movement psychotherapist Status:  Mobility: Bed Mobility Overal bed mobility: Needs Assistance Bed Mobility: Supine to Sit Supine to sit: HOB elevated, Min assist General bed mobility comments: cues for technique, scooting to  EOB, assist for stability due to L lateral lean Transfers Overall transfer level: Needs assistance Equipment used: Rolling walker (2 wheeled) Transfers: Sit to/from Stand Sit to Stand: From elevated surface, Mod assist General transfer comment: some assist for balance/lifting Ambulation/Gait Ambulation/Gait assistance: Mod assist Gait Distance (Feet): 25 Feet Assistive device: Rolling walker (2 wheeled) Gait Pattern/deviations: Step-to pattern, Step-through pattern, Decreased stride length, Decreased dorsiflexion - left, Wide base of support General Gait Details: assist to keep L hand on walker, assist for walker management and balance due to L lateral lean   ADL:   Cognition:  Cognition Overall Cognitive Status: Impaired/Different from baseline Orientation Level: Oriented X4 Cognition Arousal/Alertness: Awake/alert Behavior During Therapy: WFL for tasks assessed/performed Overall Cognitive Status: Impaired/Different from baseline Area of Impairment: Attention, Safety/judgement, Following commands Current Attention Level: Sustained Following Commands: Follows one step commands consistently, Follows one step commands with increased time Safety/Judgement: Decreased awareness of safety, Decreased awareness of deficits     Blood pressure (!) 147/88, pulse 73, temperature 98 F (36.7 C), temperature source Oral, resp. rate 17, height 6' (1.829 m), weight 88.5 kg, SpO2 91 %. Physical Exam  Nursing note and vitals reviewed. Constitutional: He appears well-developed and well-nourished.  Sitting up in bed; eyes closed off and on- then fell completely asleep at end of exam/interview after 10-15 minutes of fighting sleep. No acute distress  Daughter right outside room- spoke with her as well,   HENT:  Head: Normocephalic and atraumatic.  Nose: Nose normal.  Mouth/Throat: Oropharynx is clear and moist. No oropharyngeal exudate.  Pt has L facial droop- tongue appears midline, but won't  stick out all the way.  Says facial sensation intact, but falling asleep.   Eyes:  EOMI B/L no nystagmus seen  Neck: No tracheal deviation present.  Cardiovascular:  RRR- no M/R/G  Respiratory: No stridor. He has rhonchi.  CTA B/L- except a little coarse at bases B/L- no W/R/R  GI:  Soft, NT, ND, hyperactive BS  Musculoskeletal:     Cervical back: Normal range of motion and neck supple.     Comments: Right 3-5th toe amputation sites well healed.   RUE and RLE 5/5 in deltoid, biceps, triceps, WE, grip and finger abd; HF, KE, KF, DF and PF  LUE- hard ot get pt to try- says "nothing there", however strength 2/5 in deltoids, biceps, triceps, and WE; 2-/5 grip and 0/5 finger abd- hand already in fist.   LLE- HF, KE, KF, DF and PF 5-/5  Neurological: He is alert.  Pt very muzzy headed- said he knew in hospital and month but didn't know day/date.   L inattention to entire L side.  Also when yawned or stretched, L arm would posture upwards and shake like a Palsy type arm.   Per daughter, also speech is impaired- not speaking like he normally does- was using very spare language- only a few words- no descriptors.    Skin: Skin is warm and dry.  Psychiatric:  Very sleepy- hard to arouse; no significant spontaneous speech.       Lab Results Last 24 Hours       Results for orders placed or performed during the hospital encounter of 01/27/20 (from the past 24 hour(s))  CBG monitoring, ED     Status: Abnormal    Collection Time: 01/27/20  9:23 PM  Result Value Ref Range    Glucose-Capillary 128 (H) 70 - 99 mg/dL  Protime-INR     Status: None    Collection Time: 01/27/20  9:26 PM  Result Value Ref Range    Prothrombin Time 12.5 11.4 - 15.2 seconds    INR 1.0 0.8 - 1.2  APTT     Status: None    Collection Time: 01/27/20  9:26 PM  Result Value Ref Range    aPTT 27 24 - 36 seconds  CBC     Status: Abnormal    Collection Time: 01/27/20  9:26 PM  Result Value Ref Range    WBC 11.1 (H)  4.0 - 10.5 K/uL    RBC 5.78 4.22 - 5.81 MIL/uL  Hemoglobin 17.3 (H) 13.0 - 17.0 g/dL    HCT 16.1 (H) 09.6 - 52.0 %    MCV 94.6 80.0 - 100.0 fL    MCH 29.9 26.0 - 34.0 pg    MCHC 31.6 30.0 - 36.0 g/dL    RDW 04.5 40.9 - 81.1 %    Platelets 223 150 - 400 K/uL    nRBC 0.0 0.0 - 0.2 %  Differential     Status: Abnormal    Collection Time: 01/27/20  9:26 PM  Result Value Ref Range    Neutrophils Relative % 75 %    Neutro Abs 8.3 (H) 1.7 - 7.7 K/uL    Lymphocytes Relative 17 %    Lymphs Abs 1.9 0.7 - 4.0 K/uL    Monocytes Relative 7 %    Monocytes Absolute 0.8 0.1 - 1.0 K/uL    Eosinophils Relative 0 %    Eosinophils Absolute 0.0 0.0 - 0.5 K/uL    Basophils Relative 1 %    Basophils Absolute 0.1 0.0 - 0.1 K/uL    Immature Granulocytes 0 %    Abs Immature Granulocytes 0.04 0.00 - 0.07 K/uL  Comprehensive metabolic panel     Status: Abnormal    Collection Time: 01/27/20  9:26 PM  Result Value Ref Range    Sodium 140 135 - 145 mmol/L    Potassium 4.3 3.5 - 5.1 mmol/L    Chloride 106 98 - 111 mmol/L    CO2 22 22 - 32 mmol/L    Glucose, Bld 134 (H) 70 - 99 mg/dL    BUN 9 6 - 20 mg/dL    Creatinine, Ser 9.14 0.61 - 1.24 mg/dL    Calcium 9.4 8.9 - 78.2 mg/dL    Total Protein 7.7 6.5 - 8.1 g/dL    Albumin 4.1 3.5 - 5.0 g/dL    AST 18 15 - 41 U/L    ALT 17 0 - 44 U/L    Alkaline Phosphatase 64 38 - 126 U/L    Total Bilirubin 0.5 0.3 - 1.2 mg/dL    GFR calc non Af Amer >60 >60 mL/min    GFR calc Af Amer >60 >60 mL/min    Anion gap 12 5 - 15  I-stat chem 8, ED     Status: Abnormal    Collection Time: 01/27/20  9:33 PM  Result Value Ref Range    Sodium 140 135 - 145 mmol/L    Potassium 3.9 3.5 - 5.1 mmol/L    Chloride 106 98 - 111 mmol/L    BUN 11 6 - 20 mg/dL    Creatinine, Ser 9.56 0.61 - 1.24 mg/dL    Glucose, Bld 213 (H) 70 - 99 mg/dL    Calcium, Ion 0.86 (L) 1.15 - 1.40 mmol/L    TCO2 23 22 - 32 mmol/L    Hemoglobin 18.4 (H) 13.0 - 17.0 g/dL    HCT 57.8 (H) 46.9 - 52.0 %   SARS Coronavirus 2 by RT PCR     Status: None    Collection Time: 01/28/20  2:18 AM  Result Value Ref Range    SARS Coronavirus 2 NEGATIVE NEGATIVE  Hemoglobin A1c     Status: Abnormal    Collection Time: 01/28/20  6:12 AM  Result Value Ref Range    Hgb A1c MFr Bld 5.7 (H) 4.8 - 5.6 %    Mean Plasma Glucose 116.89 mg/dL  Lipid panel     Status: Abnormal    Collection Time:  01/28/20  6:12 AM  Result Value Ref Range    Cholesterol 206 (H) 0 - 200 mg/dL    Triglycerides 272 (H) <150 mg/dL    HDL 31 (L) >53 mg/dL    Total CHOL/HDL Ratio 6.6 RATIO    VLDL 31 0 - 40 mg/dL    LDL Cholesterol 664 (H) 0 - 99 mg/dL  TSH     Status: None    Collection Time: 01/28/20 10:41 AM  Result Value Ref Range    TSH 1.784 0.350 - 4.500 uIU/mL  Vitamin B12     Status: Abnormal    Collection Time: 01/28/20 10:41 AM  Result Value Ref Range    Vitamin B-12 156 (L) 180 - 914 pg/mL       Imaging Results (Last 48 hours)  CT ANGIO HEAD W OR WO CONTRAST   Result Date: 01/27/2020 CLINICAL DATA:  Stroke, follow-up. Additional history provided: Facial droop, left arm weakness. EXAM: CT ANGIOGRAPHY HEAD AND NECK TECHNIQUE: Multidetector CT imaging of the head and neck was performed using the standard protocol during bolus administration of intravenous contrast. Multiplanar CT image reconstructions and MIPs were obtained to evaluate the vascular anatomy. Carotid stenosis measurements (when applicable) are obtained utilizing NASCET criteria, using the distal internal carotid diameter as the denominator. CONTRAST:  75mL OMNIPAQUE IOHEXOL 350 MG/ML SOLN COMPARISON:  Noncontrast head CT performed earlier the same day 01/27/2020. FINDINGS: CTA NECK FINDINGS Aortic arch: Standard aortic branching. The visualized aortic arch is unremarkable. No significant innominate or proximal subclavian artery stenosis. Right carotid system: CCA and ICA patent within the neck without stenosis. Tortuosity of the cervical ICA. Left carotid  system: CCA and ICA patent within the neck without stenosis. Mild mixed plaque within the proximal ICA. Vertebral arteries: Left vertebral artery dominant. The vertebral arteries are patent within the neck bilaterally without significant stenosis. Skeleton: No acute bony abnormality or aggressive osseous lesion. Cervical spondylosis. Most notably there is a C5-C6 disc bulge with associated endplate spurring and osteophyte ridge. Bilateral neural foraminal narrowing at least mild bony spinal canal stenosis at this level. Other neck: No neck mass or cervical lymphadenopathy. Upper chest: Centrilobular emphysema. Review of the MIP images confirms the above findings CTA HEAD FINDINGS Anterior circulation: The intracranial internal carotid arteries are patent without significant stenosis. There is a moderate to moderately advanced stenosis within the distal M1 right MCA (series 11, image 58) (series 6, image 289). No right M2 proximal branch occlusion is identified. The M1 left middle cerebral artery is patent without significant stenosis. No left M2 proximal branch occlusion is identified. The anterior cerebral arteries are patent without high-grade proximal stenosis. The A1 left ACA is dominant. There is an apparent high-grade stenosis within the A3-A4 left ACA (series 10, image 30). No intracranial aneurysm is identified. Posterior circulation: The intracranial internal carotid arteries are patent. Mixed plaque results in sites of up to moderate stenosis within the intracranial right vertebral artery. No more than mild atherosclerotic narrowing of the intracranial left vertebral artery. The basilar artery is patent with mild atherosclerotic irregularity. The posterior cerebral arteries are patent bilaterally without significant proximal stenosis. A left posterior communicating artery is present. A right posterior communicating artery is not definitively identified. Venous sinuses: Within limitations of contrast  timing, no convincing thrombus. Anatomic variants: As described. Review of the MIP images confirms the above findings These results were called by telephone at the time of interpretation on 01/27/2020 at 10:20 pm to provider Arther Dames , who verbally  acknowledged these results. IMPRESSION: CTA neck: 1. The bilateral common carotid, internal carotid and vertebral arteries are patent within the neck without significant stenosis. Mild mixed plaque within the proximal left ICA. 2. Pulmonary emphysema. CTA head: 1. No intracranial large vessel occlusion. 2. Atherosclerotic disease with multifocal stenoses, most notably as follows. 3. Moderate to moderately advanced stenosis within the distal M1 right MCA. 4. Apparent high-grade focal stenosis within an A3-A4 left ACA branch vessel. 5. Sites of up to moderate atherosclerotic narrowing within the intracranial right vertebral artery. Electronically Signed   By: Jackey Loge DO   On: 01/27/2020 22:20    CT ANGIO NECK W OR WO CONTRAST   Result Date: 01/27/2020 CLINICAL DATA:  Stroke, follow-up. Additional history provided: Facial droop, left arm weakness. EXAM: CT ANGIOGRAPHY HEAD AND NECK TECHNIQUE: Multidetector CT imaging of the head and neck was performed using the standard protocol during bolus administration of intravenous contrast. Multiplanar CT image reconstructions and MIPs were obtained to evaluate the vascular anatomy. Carotid stenosis measurements (when applicable) are obtained utilizing NASCET criteria, using the distal internal carotid diameter as the denominator. CONTRAST:  38mL OMNIPAQUE IOHEXOL 350 MG/ML SOLN COMPARISON:  Noncontrast head CT performed earlier the same day 01/27/2020. FINDINGS: CTA NECK FINDINGS Aortic arch: Standard aortic branching. The visualized aortic arch is unremarkable. No significant innominate or proximal subclavian artery stenosis. Right carotid system: CCA and ICA patent within the neck without stenosis. Tortuosity of the  cervical ICA. Left carotid system: CCA and ICA patent within the neck without stenosis. Mild mixed plaque within the proximal ICA. Vertebral arteries: Left vertebral artery dominant. The vertebral arteries are patent within the neck bilaterally without significant stenosis. Skeleton: No acute bony abnormality or aggressive osseous lesion. Cervical spondylosis. Most notably there is a C5-C6 disc bulge with associated endplate spurring and osteophyte ridge. Bilateral neural foraminal narrowing at least mild bony spinal canal stenosis at this level. Other neck: No neck mass or cervical lymphadenopathy. Upper chest: Centrilobular emphysema. Review of the MIP images confirms the above findings CTA HEAD FINDINGS Anterior circulation: The intracranial internal carotid arteries are patent without significant stenosis. There is a moderate to moderately advanced stenosis within the distal M1 right MCA (series 11, image 58) (series 6, image 289). No right M2 proximal branch occlusion is identified. The M1 left middle cerebral artery is patent without significant stenosis. No left M2 proximal branch occlusion is identified. The anterior cerebral arteries are patent without high-grade proximal stenosis. The A1 left ACA is dominant. There is an apparent high-grade stenosis within the A3-A4 left ACA (series 10, image 30). No intracranial aneurysm is identified. Posterior circulation: The intracranial internal carotid arteries are patent. Mixed plaque results in sites of up to moderate stenosis within the intracranial right vertebral artery. No more than mild atherosclerotic narrowing of the intracranial left vertebral artery. The basilar artery is patent with mild atherosclerotic irregularity. The posterior cerebral arteries are patent bilaterally without significant proximal stenosis. A left posterior communicating artery is present. A right posterior communicating artery is not definitively identified. Venous sinuses: Within  limitations of contrast timing, no convincing thrombus. Anatomic variants: As described. Review of the MIP images confirms the above findings These results were called by telephone at the time of interpretation on 01/27/2020 at 10:20 pm to provider Arther Dames , who verbally acknowledged these results. IMPRESSION: CTA neck: 1. The bilateral common carotid, internal carotid and vertebral arteries are patent within the neck without significant stenosis. Mild mixed plaque within the proximal  left ICA. 2. Pulmonary emphysema. CTA head: 1. No intracranial large vessel occlusion. 2. Atherosclerotic disease with multifocal stenoses, most notably as follows. 3. Moderate to moderately advanced stenosis within the distal M1 right MCA. 4. Apparent high-grade focal stenosis within an A3-A4 left ACA branch vessel. 5. Sites of up to moderate atherosclerotic narrowing within the intracranial right vertebral artery. Electronically Signed   By: Kellie Simmering DO   On: 01/27/2020 22:20    MR BRAIN WO CONTRAST   Result Date: 01/28/2020 CLINICAL DATA:  Left-sided weakness EXAM: MRI HEAD WITHOUT CONTRAST TECHNIQUE: Multiplanar, multiecho pulse sequences of the brain and surrounding structures were obtained without intravenous contrast. Coronal T2-weighted imaging and susceptibility weighted imaging were not acquired. COMPARISON:  CTA head 01/27/2020 FINDINGS: Brain: Multifocal abnormal diffusion restriction within the anterior right MCA territory, including along the right precentral gyrus and the right basal ganglia. No hemorrhage or mass effect. Multifocal white matter hyperintensity, most commonly due to chronic ischemic microangiopathy. Normal volume of CSF spaces. No chronic microhemorrhage. Normal midline structures. Vascular: Normal flow voids. Skull and upper cervical spine: Normal marrow signal. Sinuses/Orbits: Negative. Other: None. IMPRESSION: Multifocal acute/early subacute within the anterior right MCA territory. No  hemorrhage or mass effect. Electronically Signed   By: Ulyses Jarred M.D.   On: 01/28/2020 01:39    ECHOCARDIOGRAM COMPLETE   Result Date: 01/28/2020    ECHOCARDIOGRAM REPORT   Patient Name:   ISAMI A Dirocco Date of Exam: 01/28/2020 Medical Rec #:  [id number]    Height:       72.0 in Accession #:    [id number]   Weight:       195.0 lb Date of Birth:  [birth year]    BSA:          2.108 m Patient Age:    74 years     BP:           135/95 mmHg Patient Gender: M            HR:           83 bpm. Exam Location:  Inpatient Procedure: 2D Echo and Intracardiac Opacification Agent Indications:    TIA 435.9 / G45.9  History:        Patient has prior history of Echocardiogram examinations, most                 recent 06/29/2013. PAD.  Sonographer:    Darlina Sicilian RDCS Referring Phys: [phone] [facility] L GARBA  Sonographer Comments: Technically difficult study due to poor echo windows. IMPRESSIONS  1. Left ventricular ejection fraction, by estimation, is 60 to 65%. The left ventricle has normal function. The left ventricle has no regional wall motion abnormalities. Left ventricular diastolic function could not be evaluated.  2. Right ventricular systolic function was not well visualized. The right ventricular size is not well visualized.  3. The mitral valve is grossly normal. No evidence of mitral valve regurgitation.  4. The aortic valve was not well visualized. Aortic valve regurgitation is not visualized.  5. Very limited echo, however, LV function appears normal. FINDINGS  Left Ventricle: Left ventricular ejection fraction, by estimation, is 60 to 65%. The left ventricle has normal function. The left ventricle has no regional wall motion abnormalities. Definity contrast agent was given IV to delineate the left ventricular  endocardial borders. The left ventricular internal cavity size was normal in size. There is no left ventricular hypertrophy. Left ventricular diastolic function could not be evaluated. Right Ventricle: The  right ventricular size is not well visualized. Right vetricular wall thickness was not assessed. Right ventricular systolic function was not well visualized. Left Atrium: Left atrial size was normal in size. Right Atrium: Right atrial size was normal in size. Pericardium: There is no evidence of pericardial effusion. Mitral Valve: The mitral valve is grossly normal. No evidence of mitral valve regurgitation. Tricuspid Valve: The tricuspid valve is not well visualized. Tricuspid valve regurgitation is not demonstrated. Aortic Valve: The aortic valve was not well visualized. Aortic valve regurgitation is not visualized. Pulmonic Valve: The pulmonic valve was not well visualized. Pulmonic valve regurgitation is not visualized. Aorta: The aortic root was not well visualized. Venous: The inferior vena cava was not well visualized. IAS/Shunts: The interatrial septum was not well visualized.  LEFT VENTRICLE PLAX 2D LVIDd:         5.60 cm  Diastology LVIDs:         3.30 cm  LV e' lateral:   8.27 cm/s LV PW:         1.10 cm  LV E/e' lateral: 6.9 LV IVS:        1.10 cm  LV e' medial:    7.94 cm/s LVOT diam:     2.10 cm  LV E/e' medial:  7.2 LVOT Area:     3.46 cm  MITRAL VALVE MV Area (PHT): 5.02 cm    SHUNTS MV Decel Time: 151 msec    Systemic Diam: 2.10 cm MV E velocity: 56.80 cm/s MV A velocity: 39.90 cm/s MV E/A ratio:  1.42 Zoila Shutter MD Electronically signed by Zoila Shutter MD Signature Date/Time: 01/28/2020/11:11:55 AM    Final     CT HEAD CODE STROKE WO CONTRAST   Result Date: 01/27/2020 CLINICAL DATA:  Code stroke. Facial droop; left arm weakness, last known well 2300 yesterday. EXAM: CT HEAD WITHOUT CONTRAST TECHNIQUE: Contiguous axial images were obtained from the base of the skull through the vertex without intravenous contrast. COMPARISON:  No pertinent prior studies available for comparison. FINDINGS: Brain: There is no evidence of acute intracranial hemorrhage, intracranial mass, midline shift or  extra-axial fluid collection.No demarcated cortical infarction. Cerebral volume is normal for age. Vascular: No hyperdense vessel. Skull: Normal. Negative for fracture or focal lesion. Sinuses/Orbits: Visualized orbits demonstrate no acute abnormality. Mild ethmoid sinus mucosal thickening. No significant mastoid effusion. These results were called by telephone at the time of interpretation on 01/27/2020 at 9:36 pm to provider Dr. Laurence Slate, who verbally acknowledged these results. IMPRESSION: No CT evidence of acute intracranial abnormality. Electronically Signed   By: Jackey Loge DO   On: 01/27/2020 21:37         Assessment/Plan: Diagnosis: R MCA stroke with L inattention, L hemiparesis, and impaired cognition 1. Does the need for close, 24 hr/day medical supervision in concert with the patient's rehab needs make it unreasonable for this patient to be served in a less intensive setting? Yes 2. Co-Morbidities requiring supervision/potential complications: tobacco 2 ppd; PVD, PAD, low back pain; HTN; sedated 3. Due to bladder management, bowel management, safety, skin/wound care, disease management, medication administration and patient education, does the patient require 24 hr/day rehab nursing? Yes 4. Does the patient require coordinated care of a physician, rehab nurse, therapy disciplines of PT, OT, SLP to address physical and functional deficits in the context of the above medical diagnosis(es)? Yes Addressing deficits in the following areas: balance, endurance, locomotion, strength, transferring, bathing, dressing, feeding, grooming, toileting, cognition, language and psychosocial support 5. Can the  patient actively participate in an intensive therapy program of at least 3 hrs of therapy per day at least 5 days per week? Yes 6. The potential for patient to make measurable gains while on inpatient rehab is good 7. Anticipated functional outcomes upon discharge from inpatient rehab are modified  independent and supervision  with PT, modified independent and supervision with OT, supervision with SLP. 8. Estimated rehab length of stay to reach the above functional goals is: 10-14 days 9. Anticipated discharge destination: Home 10. Overall Rehab/Functional Prognosis: good   RECOMMENDATIONS: This patient's condition is appropriate for continued rehabilitative care in the following setting: CIR Patient has agreed to participate in recommended program. Potentially Note that insurance prior authorization may be required for reimbursement for recommended care.   Comment:  1. Pt is an appropriate pt- his daughter notes he lives alone and her grandmother cannot care for him physically, however could help financially- pt lives in 1st floor duplex in [place], [place].    2. At risk of developing spasticity since posturing/spasming with LUE already- will closely monitor on CIR.   3.  Please Set up room so pt has to look left to help overcome his L inattention- and encourage to use his LUE- has more strength than he realizes.    4. Pt said will stop smoking, but this needs to be strongly encouraged- smoked 2ppd.    Jacquelynn Cree, PA-C 01/28/2020    I have personally performed a face to face diagnostic evaluation of this patient and formulated the key components of the plan.  Additionally, I have personally reviewed laboratory data, imaging studies, as well as relevant notes and concur with the physician assistant's documentation above.   The patient's status has not changed from the original consult.  Any changes in documentation from the acute care chart have been noted above.            Revision History                          Routing History

## 2020-01-30 NOTE — PMR Pre-admission (Signed)
PMR Admission Coordinator Pre-Admission Assessment  Patient: Cody Baird is an 59 y.o., male MRN: [id number] DOB: 07/16/1961 Height: 6' (182.9 cm) Weight: 88.5 kg              Insurance Information HMO:     PPO:      PCP:      IPA:      80/20:      OTHER:  PRIMARY: uninsured      I spoke with financial counselor, Mervyn Skeeters at [phone] and she is assessing for long term disability  Medicaid Application Date:       Case Manager:  Disability Application Date:       Case Worker:   The "Data Collection Information Summary" for patients in Inpatient Rehabilitation Facilities with attached "Privacy Act Statement-Health Care Records" was provided and verbally reviewed with: N/A  Emergency Contact Information Contact Information    Name Relation Home Work Mobile   Shaffer,Myrtle Mother [phone]     Darnell, Jeschke Daughter   [phone]     Current Medical History  Patient Admitting Diagnosis: CVA  History of Present Illness Cody Baird is a 59 year old male with history of PAD/PVD, ongoing tobacco use 2 PPD, chronic back pain who was admitted on 01/27/20 with progressive left sided weakness and slurred speech. CTA head/neck showed moderate to moderately advanced stenosis distal M1 right MCA and high grade focal stenosis A3-A4 left ACA branch as well as pulmonary emphysema. MRI brain showed multifocal acute/subacute infarcts within anterior right MCA territory. 2D echo showed EF 60-65% without wall abnormality. Workup also revealed persistent polycythemia and low vitamin B12 levels. TEE was negative for PFO but showed small insignificant pulmonary shunt.  BLE dopplers negative for DVT.  He has had asymptomatic bradycardia with 3.5 second pause and stroke felt to be embolic due to uncertain etiology. 30 day cardiac monitor recommended as outpatient to rule out A fib. Dr. Roda Shutters recommends DAPT x 3 months followed by ASA alone. Therapy ongoing and patient continues to have limitations in mobility and  ADLs due to left sided weakness.   Complete NIHSS TOTAL: 1 Glasgow Coma Scale Score: 15  Past Medical History  Past Medical History:  Diagnosis Date  . Arthritis    low back and left hip  . B12 deficiency   . Bradycardia   . Chronic back pain   . Headache(784.0)    occasionally and sinus related   . History of bronchitis    last time about 2-58yrs ago  . PAD (peripheral artery disease) (HCC)   . Peripheral vascular disease (HCC)   . Prolapsed, anus    hx of  . Stroke [facility]) 01/2020    Family History  family history includes Cancer in his mother; Diabetes in his father and mother; Heart attack in his father; Hyperlipidemia in his father; Hypertension in his father.  Prior Rehab/Hospitalizations:  Has the patient had prior rehab or hospitalizations prior to admission? Yes  Has the patient had major surgery during 100 days prior to admission? No  Current Medications   Current Facility-Administered Medications:  .   stroke: mapping our early stages of recovery book, , Does not apply, Once, Garba, Mohammad L, MD .  0.9 %  sodium chloride infusion, , Intravenous, Continuous, Mikeal Hawthorne, Mohammad L, MD, Stopped at 01/30/20 1106 .  acetaminophen (TYLENOL) tablet 650 mg, 650 mg, Oral, Q4H PRN **OR** acetaminophen (TYLENOL) 160 MG/5ML solution 650 mg, 650 mg, Per Tube, Q4H PRN **OR** acetaminophen (TYLENOL)  suppository 650 mg, 650 mg, Rectal, Q4H PRN, Mikeal Hawthorne, Mohammad L, MD .  aspirin EC tablet 325 mg, 325 mg, Oral, Daily, Earlie Lou L, MD, 325 mg at 01/30/20 0834 .  atorvastatin (LIPITOR) tablet 40 mg, 40 mg, Oral, q1800, Earlie Lou L, MD, 40 mg at 01/29/20 1624 .  clopidogrel (PLAVIX) tablet 75 mg, 75 mg, Oral, Daily, Marvel Plan, MD, 75 mg at 01/30/20 0834 .  enoxaparin (LOVENOX) injection 40 mg, 40 mg, Subcutaneous, Q24H, Garba, Mohammad L, MD, 40 mg at 01/30/20 0834 .  senna-docusate (Senokot-S) tablet 1 tablet, 1 tablet, Oral, QHS PRN, Mikeal Hawthorne, Mohammad L, MD .  sodium chloride  flush (NS) 0.9 % injection 3 mL, 3 mL, Intravenous, Once, Jacalyn Lefevre, MD .  vitamin B-12 (CYANOCOBALAMIN) tablet 1,000 mcg, 1,000 mcg, Oral, Daily, Marvel Plan, MD, 1,000 mcg at 01/30/20 2229  Patients Current Diet:  Diet Order            Diet - low sodium heart healthy        Diet Heart Room service appropriate? Yes; Fluid consistency: Thin  Diet effective now              Precautions / Restrictions Precautions Precautions: Fall Precaution Comments: L side weakness Restrictions Weight Bearing Restrictions: No   Has the patient had 2 or more falls or a fall with injury in the past year?No  Prior Activity Level Community (5-7x/wk): Independent  Prior Functional Level Prior Function Level of Independence: Independent Comments: independent with ADLs/selfcare, IADLs, home mgt, was working, does not drive  Self Care: Did the patient need help bathing, dressing, using the toilet or eating?  Independent  Indoor Mobility: Did the patient need assistance with walking from room to room (with or without device)? Independent  Stairs: Did the patient need assistance with internal or external stairs (with or without device)? Independent  Functional Cognition: Did the patient need help planning regular tasks such as shopping or remembering to take medications? Independent  Home Assistive Devices / Equipment Home Assistive Devices/Equipment: None Home Equipment: None  Prior Device Use: Indicate devices/aids used by the patient prior to current illness, exacerbation or injury? cane  Current Functional Level Cognition  Overall Cognitive Status: Impaired/Different from baseline Current Attention Level: Sustained Orientation Level: Oriented X4 Following Commands: Follows one step commands consistently, Follows one step commands with increased time Safety/Judgement: Decreased awareness of safety, Decreased awareness of deficits General Comments: needs PT cuing to call attention  to deficits, especially during ambulation Attention: Sustained, Selective Sustained Attention: Impaired Sustained Attention Impairment: Verbal complex Memory: Impaired Memory Impairment: Retrieval deficit(4/5 words recalled independently, 1/5 with multiple choice cue) Awareness: Impaired Awareness Impairment: Intellectual impairment(decreased awareness to leaning to the left until made aware) Problem Solving: Appears intact    Extremity Assessment (includes Sensation/Coordination)  Upper Extremity Assessment: LUE deficits/detail LUE Deficits / Details: AROM impaired, AAROM WFL, strength shoulder flexion 3-/5, elbow extension/flexion 3-/5, zero grip LUE Sensation: decreased light touch  Lower Extremity Assessment: LLE deficits/detail, RLE deficits/detail RLE Deficits / Details: AROM/strength WFL, numbness/burning/tingling in foot h/o neuropathy RLE Sensation: decreased light touch, history of peripheral neuropathy LLE Deficits / Details: AROM WFL, strength hip flexion 4-/5, knee extension 4/5, ankle DF 2/5    ADLs  Overall ADL's : Needs assistance/impaired Eating/Feeding: Set up, Sitting Grooming: Sitting, Oral care, Wash/dry hands, Wash/dry face, Minimal assistance Upper Body Bathing: Moderate assistance, Sitting Upper Body Bathing Details (indicate cue type and reason): assisted to wash back Lower Body Bathing: Total assistance Upper  Body Dressing : Minimal assistance, Sitting Lower Body Dressing: Total assistance Toilet Transfer: Ambulation, RW, Min guard Toilet Transfer Details (indicate cue type and reason): stood to urinate Toileting- Architect and Hygiene: Min guard Functional mobility during ADLs: Minimal assistance, Rolling walker, Cueing for safety General ADL Comments: pt sat EOB x 10 minutes. Pt with Poor sitting balance and required min A for balance/support    Mobility  Overal bed mobility: Needs Assistance Bed Mobility: Supine to Sit, Sit to  Supine Supine to sit: Supervision Sit to supine: Supervision General bed mobility comments: increased time    Transfers  Overall transfer level: Needs assistance Equipment used: Rolling walker (2 wheeled) Transfers: Sit to/from Stand Sit to Stand: Min guard, From elevated surface General transfer comment: good technique    Ambulation / Gait / Stairs / Wheelchair Mobility  Ambulation/Gait Ambulation/Gait assistance: Mod assist Gait Distance (Feet): 25 Feet(2x25 ft) Assistive device: Rolling walker (2 wheeled) Gait Pattern/deviations: Step-to pattern, Step-through pattern, Decreased stride length, Decreased dorsiflexion - left, Wide base of support, Shuffle General Gait Details: Mod assist for directing pt and RW as pt with L lateral leaning and preference, steadying. Verbal cuing for placement in RW, L foot flat during stance as pt with preference for WB through lateral portion of foot in signficant inversion. Seated rest break x2 minutes Gait velocity: decr    Posture / Balance Dynamic Sitting Balance Sitting balance - Comments: No L lateral leaning in sitting this session, practiced L lateral scooting at EOB, static sitting. Sat EOB both static and dynamic x10 minutes Balance Overall balance assessment: Needs assistance Sitting-balance support: Feet supported Sitting balance-Leahy Scale: Fair Sitting balance - Comments: No L lateral leaning in sitting this session, practiced L lateral scooting at EOB, static sitting. Sat EOB both static and dynamic x10 minutes Postural control: Left lateral lean Standing balance support: Bilateral upper extremity supported Standing balance-Leahy Scale: Poor Standing balance comment: reliant on at least one hand support in static standing    Special needs/care consideration BiPAP/CPAP n/a CPM n/a Continuous Drip IV n/a Dialysis n/a Life Vest n/a Oxygen n/a Special Bed n/a Trach Size n/a Wound Vac n/a Skin intact Bowel mgmt:  continent Bladder mgmt: continent Diabetic mgmt Hgb A1c 5.7 Behavioral consideration  N/a Chemo/radiation  N/a Designated visitor is daughter, Morrie Sheldon   Previous Home Environment  Living Arrangements: Alone  Lives With: Alone Available Help at Discharge: Friend(s), Available PRN/intermittently Type of Home: Apartment Home Layout: One level Home Access: Stairs to enter Entrance Stairs-Rails: Right Entrance Stairs-Number of Steps: 2-3 Bathroom Shower/Tub: Engineer, manufacturing systems: Standard Bathroom Accessibility: Yes How Accessible: Accessible via walker Home Care Services: No  Discharge Living Setting Plans for Discharge Living Setting: Patient's home, Apartment, Alone Type of Home at Discharge: Apartment Discharge Home Layout: One level Discharge Home Access: Stairs to enter Entrance Stairs-Rails: Right Entrance Stairs-Number of Steps: 2 to 3 Discharge Bathroom Shower/Tub: Tub/shower unit Discharge Bathroom Toilet: Standard Discharge Bathroom Accessibility: Yes How Accessible: Accessible via walker Does the patient have any problems obtaining your medications?: Yes (Describe)(uninsured)  Social/Family/Support Systems Patient Roles: Parent Contact Information: daughter, Morrie Sheldon Anticipated Caregiver: friends prn Anticipated Caregiver's Contact Information: see above Caregiver Availability: Intermittent Discharge Plan Discussed with Primary Caregiver: Yes Is Caregiver In Agreement with Plan?: Yes Does Caregiver/Family have Issues with Lodging/Transportation while Pt is in Rehab?: No  Goals/Additional Needs Patient/Family Goal for Rehab: Mod I with PT, OT, and SLP Expected length of stay: ELOS 10 to 14 days Pt/Family Agrees to Admission  and willing to participate: Yes Program Orientation Provided & Reviewed with Pt/Caregiver Including Roles  & Responsibilities: Yes  Decrease burden of Care through IP rehab admission: n/a  Possible need for SNF placement upon  discharge: n/a  Patient Condition: This patient's condition remains as documented in the consult dated 01/28/2020, in which the Rehabilitation Physician determined and documented that the patient's condition is appropriate for intensive rehabilitative care in an inpatient rehabilitation facility. Will admit to inpatient rehab today.  Preadmission Screen Completed By:  Cleatrice Burke, RN, 01/30/2020 12:34 PM ______________________________________________________________________   Discussed status with Dr. Dagoberto Ligas on 01/20/2020 at  49 and received approval for admission today.  Admission Coordinator:  Cleatrice Burke, time 8546 on 01/20/2020

## 2020-01-31 ENCOUNTER — Inpatient Hospital Stay (HOSPITAL_COMMUNITY): Payer: Self-pay | Admitting: Speech Pathology

## 2020-01-31 ENCOUNTER — Inpatient Hospital Stay (HOSPITAL_COMMUNITY): Payer: Self-pay | Admitting: Occupational Therapy

## 2020-01-31 ENCOUNTER — Inpatient Hospital Stay (HOSPITAL_COMMUNITY): Payer: Self-pay | Admitting: Physical Therapy

## 2020-01-31 LAB — COMPREHENSIVE METABOLIC PANEL
ALT: 17 U/L (ref 0–44)
AST: 19 U/L (ref 15–41)
Albumin: 3.4 g/dL — ABNORMAL LOW (ref 3.5–5.0)
Alkaline Phosphatase: 51 U/L (ref 38–126)
Anion gap: 10 (ref 5–15)
BUN: 13 mg/dL (ref 6–20)
CO2: 23 mmol/L (ref 22–32)
Calcium: 8.8 mg/dL — ABNORMAL LOW (ref 8.9–10.3)
Chloride: 105 mmol/L (ref 98–111)
Creatinine, Ser: 0.94 mg/dL (ref 0.61–1.24)
GFR calc Af Amer: >60 mL/min (ref >60)
GFR calc non Af Amer: >60 mL/min (ref >60)
Glucose, Bld: 99 mg/dL (ref 70–99)
Potassium: 3.4 mmol/L — ABNORMAL LOW (ref 3.5–5.1)
Sodium: 138 mmol/L (ref 135–145)
Total Bilirubin: 0.9 mg/dL (ref 0.3–1.2)
Total Protein: 6.6 g/dL (ref 6.5–8.1)

## 2020-01-31 LAB — CBC WITH DIFFERENTIAL/PLATELET
Abs Immature Granulocytes: 0.04 10*3/uL (ref 0.00–0.07)
Basophils Absolute: 0 10*3/uL (ref 0.0–0.1)
Basophils Relative: 1 %
Eosinophils Absolute: 0.3 10*3/uL (ref 0.0–0.5)
Eosinophils Relative: 4 %
HCT: 46.9 % (ref 39.0–52.0)
Hemoglobin: 15.7 g/dL (ref 13.0–17.0)
Immature Granulocytes: 1 %
Lymphocytes Relative: 25 %
Lymphs Abs: 2 10*3/uL (ref 0.7–4.0)
MCH: 30.7 pg (ref 26.0–34.0)
MCHC: 33.5 g/dL (ref 30.0–36.0)
MCV: 91.6 fL (ref 80.0–100.0)
Monocytes Absolute: 0.9 10*3/uL (ref 0.1–1.0)
Monocytes Relative: 11 %
Neutro Abs: 4.8 10*3/uL (ref 1.7–7.7)
Neutrophils Relative %: 58 %
Platelets: 190 10*3/uL (ref 150–400)
RBC: 5.12 MIL/uL (ref 4.22–5.81)
RDW: 12.5 % (ref 11.5–15.5)
WBC: 8.1 10*3/uL (ref 4.0–10.5)
nRBC: 0 % (ref 0.0–0.2)

## 2020-01-31 MED ORDER — ZOLPIDEM TARTRATE 5 MG PO TABS
5.0000 mg | ORAL_TABLET | Freq: Every evening | ORAL | Status: DC | PRN
  Administered 2020-01-31: 5 mg via ORAL
  Filled 2020-01-31 (×2): qty 1

## 2020-01-31 NOTE — Care Management (Signed)
Patient Details  Name: Cody Baird MRN: [id number] Date of Birth: 08-Apr-1961  Today's Date: 01/31/2020  Problem List:  Patient Active Problem List   Diagnosis Date Noted  . Acute right MCA stroke (HCC) 01/30/2020  . B12 deficiency   . Bradycardia 01/29/2020  . Tobacco abuse 01/28/2020  . Essential hypertension 01/28/2020  . TIA (transient ischemic attack) 01/28/2020  . Stroke (HCC) 01/28/2020  . CVA (cerebral vascular accident) (HCC) 01/28/2020  . Aftercare following surgery of the circulatory system, NEC 01/14/2014  . Ischemic pain of foot 12/24/2013  . Atherosclerotic peripheral vascular disease with rest pain (HCC) 10/30/2013  . Post-op pain 09/17/2013  . PVD (peripheral vascular disease) (HCC) 09/17/2013  . Post-operative pain 07/16/2013  . 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 Peripheral vascular disease, unspecified (HCC) 06/25/2013  . Pain in limb 06/25/2013   Past Medical History:  Past Medical History:  Diagnosis Date  . Arthritis    low back and left hip  . B12 deficiency   . Bradycardia   . Chronic back pain   . Headache(784.0)    occasionally and sinus related   . History of bronchitis    last time about 2-2yrs ago  . PAD (peripheral artery disease) (HCC)   . Peripheral vascular disease (HCC)   . Prolapsed, anus    hx of  . Stroke [facility]) 01/2020   Past Surgical History:  Past Surgical History:  Procedure Laterality Date  . ABDOMINAL AORTAGRAM N/A 06/26/2013   Procedure: ABDOMINAL AORTAGRAM;  Surgeon: Larina Earthly, MD;  Location: [facility] CATH LAB;  Service: Cardiovascular;  Laterality: N/A;  . AMPUTATION Right 12/04/2013   Procedure: AMPUTATION DIGIT- 3RD TOE RIGHT FOOT ;  Surgeon: Larina Earthly, MD;  Location: [facility] OR;  Service: Vascular;  Laterality: Right;  . AMPUTATION Right 01/15/2014   Procedure: AMPUTATION OF RIGHT 4TH and 5TH TOES;  Surgeon: Larina Earthly, MD;  Location: [facility] OR;  Service: Vascular;   Laterality: Right;  . BACK SURGERY     L1-2  . BACK SURGERY     L1-2  . BACK SURGERY     thinks L1- 2  . COLON SURGERY     removal of colon due to proplase rectum  . COLOSTOMY    . COLOSTOMY CLOSURE  2006ish  . ESOPHAGOGASTRODUODENOSCOPY    . FEMORAL-TIBIAL BYPASS GRAFT Right 06/28/2013   Procedure: BYPASS GRAFT FEMORAL TO TIBIAL PERONEAL TRUNK ARTERY;  Surgeon: Larina Earthly, MD;  Location: [facility] OR;  Service: Vascular;  Laterality: Right;   Social History:  reports that he has been smoking cigarettes. He has a 18.00 pack-year smoking history. He has never used smokeless tobacco. He reports that he does not drink alcohol or use drugs.  Family / Support Systems Patient Roles: Parent Children: Daughter: Cody Baird Anticipated Caregiver: friends prn Ability/Limitations of Caregiver: None Caregiver Availability: Intermittent  Social History Preferred language: English Religion: Methodist Read: Yes Write: Yes   Abuse/Neglect Abuse/Neglect Assessment Can Be Completed: Yes Physical Abuse: Denies Verbal Abuse: Denies Sexual Abuse: Denies Exploitation of patient/patient's resources: Denies Self-Neglect: Denies  Emotional Status Pt's affect, behavior and adjustment status: Restricted affect, normal behavior and mood Substance Abuse History: Tobacco 2PPD  Patient / Family Perceptions, Expectations & Goals Pt/Family understanding of illness & functional limitations: Patient appears to have a good understanding of health and functional limitations Premorbid pt/family roles/activities: Independent PTA Anticipated changes in roles/activities/participation: May need assistance with home management and shopping Pt/family  expectations/goals: Patient expects to be able to return home solo and manage self care  [employer] available at discharge: Daughter or friends can provide transport at discharge  Discharge Planning Living Arrangements: [place]:  Children Type of Residence: Private residence Insurance Resources: Teacher, adult education Screen Referred: Yes Money Management: Patient Does the patient have any problems obtaining your medications?: Yes (Describe) Home Management: Daughter will help manage the home Patient/Family Preliminary Plans: Return to home with daughter assisting prn [place] Barriers to Discharge: Decreased caregiver support Sw Barriers to Discharge Comments: 2 step entry to apt Social Work Anticipated Follow Up Needs: [place] Additional Notes/Comments: Patient will need [facility] discount card and PCP Expected length of stay: ELOS 7 days  Clinical Impression The patient noted he is "done with cigarettes" and noted his mother detected heart rate irregularity years ago and MD told him that it "was the good kind, what ever that meant". He also acknowledged understanding that "I am at risk for having another stroke since I had this one". Reported he needed to work on staying healthy. Discussed need for follow up with PM+R MD and Stroke MD. Also noted he would need a PCP in [place] area if possible although he is willing to travel to [place] to see the MD if it is not on a frequent basis.Noted he was getting return in his left arm but still had some sensory limits in that arm and previous neck injury limited mobility of right arm. Thumb and pinky finger on right hand are numb.  [name] 01/31/2020, 10:47 PM

## 2020-01-31 NOTE — Plan of Care (Signed)
  Problem: RH Balance Goal: LTG Patient will maintain dynamic standing with ADLs (OT) Description: LTG:  Patient will maintain dynamic standing balance with assist during activities of daily living (OT)  Flowsheets (Taken 01/31/2020 1636) LTG: Pt will maintain dynamic standing balance during ADLs with: Independent with assistive device   Problem: Sit to Stand Goal: LTG:  Patient will perform sit to stand in prep for activites of daily living with assistance level (OT) Description: LTG:  Patient will perform sit to stand in prep for activites of daily living with assistance level (OT) Flowsheets (Taken 01/31/2020 1636) LTG: PT will perform sit to stand in prep for activites of daily living with assistance level: Independent with assistive device   Problem: RH Grooming Goal: LTG Patient will perform grooming w/assist,cues/equip (OT) Description: LTG: Patient will perform grooming with assist, with/without cues using equipment (OT) Flowsheets (Taken 01/31/2020 1636) LTG: Pt will perform grooming with assistance level of: Independent with assistive device    Problem: RH Bathing Goal: LTG Patient will bathe all body parts with assist levels (OT) Description: LTG: Patient will bathe all body parts with assist levels (OT) Flowsheets (Taken 01/31/2020 1636) LTG: Pt will perform bathing with assistance level/cueing: Independent with assistive device    Problem: RH Dressing Goal: LTG Patient will perform upper body dressing (OT) Description: LTG Patient will perform upper body dressing with assist, with/without cues (OT). Flowsheets (Taken 01/31/2020 1636) LTG: Pt will perform upper body dressing with assistance level of: Independent with assistive device   Problem: RH Toileting Goal: LTG Patient will perform toileting task (3/3 steps) with assistance level (OT) Description: LTG: Patient will perform toileting task (3/3 steps) with assistance level (OT)  Flowsheets (Taken 01/31/2020 1636) LTG: Pt  will perform toileting task (3/3 steps) with assistance level: Independent with assistive device   Problem: RH Toilet Transfers Goal: LTG Patient will perform toilet transfers w/assist (OT) Description: LTG: Patient will perform toilet transfers with assist, with/without cues using equipment (OT) Flowsheets (Taken 01/31/2020 1636) LTG: Pt will perform toilet transfers with assistance level of: Independent with assistive device   Problem: RH Tub/Shower Transfers Goal: LTG Patient will perform tub/shower transfers w/assist (OT) Description: LTG: Patient will perform tub/shower transfers with assist, with/without cues using equipment (OT) Flowsheets (Taken 01/31/2020 1636) LTG: Pt will perform tub/shower stall transfers with assistance level of: Independent with assistive device

## 2020-01-31 NOTE — Evaluation (Signed)
Occupational Therapy Assessment and Plan  Patient Details  Name: Cody Baird MRN: [id number] Date of Birth: [birth year]  OT Diagnosis: abnormal posture, cognitive deficits, hemiplegia affecting non-dominant side and muscle weakness (generalized) Rehab Potential: Rehab Potential (ACUTE ONLY): Excellent ELOS: 7-10 days   Today's Date: 01/31/2020 OT Individual Time: 0945-1100 OT Individual Time Calculation (min): 75 min     Problem List:  Patient Active Problem List   Diagnosis Date Noted  . Acute right MCA stroke ([place]) 01/30/2020  . B12 deficiency   . Bradycardia 01/29/2020  . Tobacco abuse 01/28/2020  . Essential hypertension 01/28/2020  . TIA (transient ischemic attack) 01/28/2020  . Stroke ([place]) 01/28/2020  . CVA (cerebral vascular accident) ([place]) 01/28/2020  . Aftercare following surgery of the circulatory system, [place] 01/14/2014  . Ischemic pain of foot 12/24/2013  . Atherosclerotic peripheral vascular disease with rest pain ([place]) 10/30/2013  . Post-op pain 09/17/2013  . PVD (peripheral vascular disease) ([place]) 09/17/2013  . Post-operative pain 07/16/2013  . 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 Peripheral vascular disease, unspecified ([place]) 06/25/2013  . Pain in limb 06/25/2013    Past Medical History:  Past Medical History:  Diagnosis Date  . Arthritis    low back and left hip  . B12 deficiency   . Bradycardia   . Chronic back pain   . Headache(784.0)    occasionally and sinus related   . History of bronchitis    last time about 2-30yr ago  . P[place]ipheral artery disease) [place]   . Peripheral vascular disease ([place]   . Prolapsed, anus    hx of  . Stroke ([facility] 01/2020   Past Surgical History:  Past Surgical History:  Procedure Laterality Date  . ABDOMINAL AORTAGRAM N/A 06/26/2013   Procedure: ABDOMINAL AORTAGRAM;  Surgeon: TRosetta Posner MD;  [facility];  Service: Cardiovascular;  Laterality:  N/A;  . AMPUTATION Right 12/04/2013   Procedure: AMPUTATION DIGIT- 3RD TOE RIGHT FOOT ;  Surgeon: TRosetta Posner MD;  [place] [facility]  Service: Vascular;  Laterality: Right;  . AMPUTATION Right 01/15/2014   Procedure: AMPUTATION OF RIGHT 4TH and 5TH TOES;  Surgeon: TRosetta Posner MD;  [place] [facility]  Service: Vascular;  Laterality: Right;  . BACK SURGERY     L1-2  . BACK SURGERY     L1-2  . BACK SURGERY     thinks L1- 2  . COLON SURGERY     removal of colon due to proplase rectum  . COLOSTOMY    . COLOSTOMY CLOSURE  2006ish  . ESOPHAGOGASTRODUODENOSCOPY    . FEMORAL-TIBIAL BYPASS GRAFT Right 06/28/2013   Procedure: BYPASS GRAFT FEMORAL TO TIBIAL PERONEAL TRUNK ARTERY;  Surgeon: TRosetta Posner MD;  [facility];  Service: Vascular;  Laterality: Right;    Assessment & Plan Clinical Impression: Cody LUSSIERis a 59year old male with history of PAD/PVD, ongoing tobacco use 2 PPD, chronic back pain who was admitted on 01/27/20 with progressive left sided weakness and slurred speech. CTA head/neck showed moderate to moderately advanced stenosis distal M1 right MCA and high grade focal stenosis A3-A4 left ACA branch as well as pulmonary emphysema. MRI brain showed multifocal acute/subacute infarcts within anterior right MCA territory. 2D echo showed EF 60-65% without wall abnormality. Workup also revealed pesistent polycythemia and low vitamin B12 levels. TEE was negative for PFO but showed small insignificant pulmonary shunt.  BLE dopplers negative for DVT.  He has had asymptomatic bradycardia with 3.5 second pause and stroke felt to be embolic due to uncertain etiology. 30 day cardiac monitor recommended as outpatient to rule out A fib. Dr. Erlinda Hong recommends DAPT x 3 months followed by ASA alone. Therapy ongoing and patient continues to have limitations in mobility and ADLs due to left sided weakness. CIR recommended due to functional decline.   Patient currently requires min with basic self-care  skills secondary to muscle weakness, decreased cardiorespiratoy endurance, unbalanced muscle activation, decreased attention to left and decreased coordination, decreased attention and decreased memory and decreased standing balance, decreased postural control and hemiplegia.  Prior to hospitalization, patient could complete BADLs with independent .  Patient will benefit from skilled intervention to increase independence with basic self-care skills prior to discharge home with PRN supervision from friends/family.  Anticipate patient will require intermittent supervision and follow up home health.  OT - End of Session Endurance Deficit: Yes(pt needed a few seated rest breaks during self care completion with pt verbalizing increased fatigue) OT Assessment Rehab Potential (ACUTE ONLY): Excellent OT Barriers to Discharge: Lack of/limited family support OT Patient demonstrates impairments in the following area(s): Balance;Perception;Cognition;Endurance;Motor;Pain OT Basic ADL's Functional Problem(s): Grooming;Bathing;Dressing;Toileting OT Advanced ADL's Functional Problem(s): Simple Meal Preparation;Laundry OT Transfers Functional Problem(s): Toilet;Tub/Shower OT Additional Impairment(s): Fuctional Use of Upper Extremity OT Plan OT Intensity: Minimum of 1-2 x/day, 45 to 90 minutes OT Frequency: 5 out of 7 days OT Duration/Estimated Length of Stay: 7-10 days OT Treatment/Interventions: Balance/vestibular training;Discharge planning;Pain management;Self Care/advanced ADL retraining;Therapeutic Activities;UE/LE Coordination activities;Visual/perceptual remediation/compensation;Therapeutic Exercise;Patient/family education;Functional mobility training;Disease mangement/prevention;Cognitive remediation/compensation;Community reintegration;DME/adaptive equipment instruction;Neuromuscular re-education;Psychosocial support;UE/LE Strength taining/ROM;Wheelchair propulsion/positioning OT Self Feeding Anticipated  Outcome(s): No goal OT Basic Self-Care Anticipated Outcome(s): Mod I OT Toileting Anticipated Outcome(s): Mod I OT Bathroom Transfers Anticipated Outcome(s): Mod I OT Recommendation Recommendations for Other Services: Neuropsych consult Patient destination: Home Follow Up Recommendations: Home health OT Equipment Recommended: To be determined  Skilled Therapeutic Intervention Skilled OT session completed with focus on initial evaluation, education on OT role/POC, and establishment of patient-centered goals.   Pt greeted in bed with no c/o pain at rest, agreeable to shower. Ambulatory transfer to TTB completed using RW with CGA. Pt reported feeling claustrophobic in cramped shower space, opting to sponge bathe EOB instead. He returned to the bed and then completed bathing/dressing tasks at sit<stand level. Pt with mild Lt inattention, had him retrieve ADL items using Lt hand, reaching towards Lt side and exhibiting some [place] deficits but able to retrieve wash cloth, shirt, grooming items, and shorts without dropping them. Supervision for dynamic sitting balance when washing and dressing feet. CGA for standing balance during LB hygiene and dressing tasks. Note that his Lt hand did fall off of the walker handle at times during dynamic standing with pt needing vcs to recognize and correct for safety. He needed a few short rest breaks due to fatigue, discussed that this was a common symptom post CVA. Pt then ambulated to the sink using RW with CGA, had a tough time meeting FM demands of opening toothbrush package and managing his toothpaste but able to do so himself with increased time. He needed vcs to remember to turn off faucet before leaving the sink afterwards. Ambulatory transfer to toilet completed without AD and Min A, pt voided bladder in standing and then returned to bed. He sanitized hands with sanitizer at [place] level. Left him with all needs within reach and bed alarm set.   Noted some mild ST  memory deficits throughout session, pt forgetting whether or not he already completed an ADL task and also if he had picked out his clothes beforehand (which he did).   OT Evaluation Precautions/Restrictions  Precautions Precautions: Fall Precaution Comments: Lt hemi, Lt inattention Pain: intermittently in Lt shoulder. Pt did not want anything medicinally from RN to address Pain Assessment Pain Scale: 0-10 Pain Score: 0-No pain Home Living/Prior Functioning Home Living Available Help at Discharge: Friend(s), Available PRN/intermittently Type of Home: Apartment Home Layout: One level Bathroom Shower/Tub: Optometrist: Yes  Lives With: Alone IADL History Homemaking Responsibilities: Yes(Pt reported having help from friends and family for grocery shopping at times) Meal Prep Responsibility: Primary Laundry Responsibility: Primary Cleaning Responsibility: Primary Bill Paying/Finance Responsibility: Primary Occupation: Retired Type of Occupation: Ran a bar, also used to work Architect Leisure and Hobbies: Golf Prior Function Level of Independence: Independent with basic ADLs, Independent with homemaking with ambulation, Independent with transfers Driving: Yes ADL ADL Eating: Not assessed Grooming: Contact guard Where Assessed-Grooming: Standing at sink Upper Body Bathing: Supervision/safety Where Assessed-Upper Body Bathing: Edge of bed Lower Body Bathing: Contact guard Where Assessed-Lower Body Bathing: Edge of bed Upper Body Dressing: Setup Where Assessed-Upper Body Dressing: Edge of bed Lower Body Dressing: Minimal assistance Where Assessed-Lower Body Dressing: Edge of bed Toileting: Contact guard Where Assessed-Toileting: Glass blower/designer: Psychiatric nurse Method: Ambulating(without RW) Insurance underwriter: Facilities manager: Curator  Method: Ambulating(with RW) Vision Baseline Vision/History: Wears glasses Wears Glasses: Reading only Perception  Perception: Impaired Inattention/Neglect: Does not attend to left visual field Praxis Praxis: Intact Cognition Overall Cognitive Status: Impaired/Different from baseline Arousal/Alertness: Awake/alert Orientation Level: Person;Place;Situation Person: Oriented Place: Oriented Situation: Oriented Year: 2021 Month: March Day of Week: Correct Memory: Impaired Memory Impairment: Decreased short term memory Immediate Memory Recall: Sock;Blue;Bed Memory Recall Sock: Without Cue Memory Recall Blue: Without Cue Memory Recall Bed: Without Cue Attention: Sustained Sustained Attention: Impaired Safety/Judgment: Appears intact Sensation Sensation Light Touch: Appears Intact Coordination Gross Motor Movements are Fluid and Coordinated: No Fine Motor Movements are Fluid and Coordinated: No Coordination and Movement Description: Mild Lt hemi, impaired fine motor control on the Lt side Motor  Motor Motor: Hemiplegia;Abnormal postural alignment and control Mobility    Min A ambulatory toilet transfer without device, CGA ambulatory shower transfer with RW Trunk/Postural Assessment  Postural Control Postural Control: Deficits on evaluation(mild impairments noted during functional transfers without device)  Balance Balance Balance Assessed: Yes Dynamic Sitting Balance Dynamic Sitting - Level of Assistance: 5: Stand by assistance(donning gripper socks EOB) Dynamic Standing Balance Dynamic Standing - Level of Assistance: 4: Min assist(Pericare completion with unilateral support on RW) Extremity/Trunk Assessment RUE Assessment RUE Assessment: Within Functional Limits Active Range of Motion (AROM) Comments: WNL LUE Assessment LUE Assessment: Exceptions to WFL(impaired [facility], [facility], [facility]) Active Range of Motion (AROM) Comments: WNL     Refer to Care Plan for Long Term  Goals  Recommendations for other services: Neuropsych   Discharge Criteria: Patient will be discharged from OT if patient refuses treatment 3 consecutive times without medical reason, if treatment goals not met, if there is a change in medical status, if patient makes no progress towards goals or if patient is discharged from hospital.  The above assessment, treatment plan, treatment alternatives and goals were discussed and mutually agreed upon: by patient  Skeet Simmer 01/31/2020, 12:36 PM

## 2020-01-31 NOTE — Progress Notes (Signed)
Patient information reviewed and entered into [facility] System by Becky Alexya Mcdaris, PPS coordinator. Information including medical coding, function ability, and quality indicators will be reviewed and updated through discharge.   

## 2020-01-31 NOTE — Evaluation (Signed)
Physical Therapy Assessment and Plan  Patient Details  Name: Cody Baird MRN: [id number] Date of Birth: 09/22/1961  PT Diagnosis: Ataxia, Coordination disorder, Hemiplegia non-dominant, Muscle spasms and Muscle weakness Rehab Potential: Excellent ELOS: 5-7 days   Today's Date: 01/31/2020 PT Individual Time: 1300-1400 PT Individual Time Calculation (min): 60 min    Problem List:  Patient Active Problem List   Diagnosis Date Noted  . Acute right MCA stroke ([place]) 01/30/2020  . B12 deficiency   . Bradycardia 01/29/2020  . Tobacco abuse 01/28/2020  . Essential hypertension 01/28/2020  . TIA (transient ischemic attack) 01/28/2020  . Stroke ([place]) 01/28/2020  . CVA (cerebral vascular accident) ([place]) 01/28/2020  . Aftercare following surgery of the circulatory system, [place] 01/14/2014  . Ischemic pain of foot 12/24/2013  . Atherosclerotic peripheral vascular disease with rest pain ([place]) 10/30/2013  . Post-op pain 09/17/2013  . PVD (peripheral vascular disease) ([place]) 09/17/2013  . Post-operative pain 07/16/2013  . 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 Peripheral vascular disease, unspecified ([place]) 06/25/2013  . Pain in limb 06/25/2013    Past Medical History:  Past Medical History:  Diagnosis Date  . Arthritis    low back and left hip  . B12 deficiency   . Bradycardia   . Chronic back pain   . Headache(784.0)    occasionally and sinus related   . History of bronchitis    last time about 2-52yr ago  . PAD (peripheral artery disease) ([place]   . Peripheral vascular disease ([place]   . Prolapsed, anus    hx of  . Stroke ([facility] 01/2020   Past Surgical History:  Past Surgical History:  Procedure Laterality Date  . ABDOMINAL AORTAGRAM N/A 06/26/2013   Procedure: ABDOMINAL AORTAGRAM;  Surgeon: TRosetta Posner MD;  [facility];  Service: Cardiovascular;  Laterality: N/A;  . AMPUTATION Right 12/04/2013   Procedure:  AMPUTATION DIGIT- 3RD TOE RIGHT FOOT ;  Surgeon: TRosetta Posner MD;  [place] [facility]  Service: Vascular;  Laterality: Right;  . AMPUTATION Right 01/15/2014   Procedure: AMPUTATION OF RIGHT 4TH and 5TH TOES;  Surgeon: TRosetta Posner MD;  [place]  Service: Vascular;  Laterality: Right;  . BACK SURGERY     L1-2  . BACK SURGERY     L1-2  . BACK SURGERY     thinks L1- 2  . COLON SURGERY     removal of colon due to proplase rectum  . COLOSTOMY    . COLOSTOMY CLOSURE  2006ish  . ESOPHAGOGASTRODUODENOSCOPY    . FEMORAL-TIBIAL BYPASS GRAFT Right 06/28/2013   Procedure: BYPASS GRAFT FEMORAL TO TIBIAL PERONEAL TRUNK ARTERY;  Surgeon: TRosetta Posner MD;  [facility];  Service: Vascular;  Laterality: Right;    Assessment & Plan Clinical Impression: Patient is a 59year old male with history of PAD/PVD, ongoing tobacco use 2 PPD, chronic back pain who was admitted on 01/27/20 with progressive left sided weakness and slurred speech. CTA head/neck showed moderate to moderately advanced stenosis distal M1 right MCA and high grade focal stenosis A3-A4 left ACA branch as well as pulmonary emphysema. MRI brain showed multifocal acute/subacute infarcts within anterior right MCA territory. 2D echo showed EF 60-65% without wall abnormality. Workup also revealed pesistent polycythemia and low vitamin B12 levels. TEE was negative for PFO but showed small insignificant pulmonary shunt.  BLE dopplers negative for DVT.  He has had asymptomatic bradycardia with 3.5 second  pause and stroke felt to be embolic due to uncertain etiology. 30 day cardiac monitor recommended as outpatient to rule out A fib. Dr. Erlinda Hong recommends DAPT x 3 months followed by ASA alone. Therapy ongoing and patient continues to have limitations in mobility and ADLs due to left sided weakness.   Patient transferred to CIR on 01/30/2020 .   Patient currently requires min with mobility secondary to muscle weakness and muscle joint tightness, decreased  cardiorespiratoy endurance, ataxia and decreased coordination and decreased sitting balance, decreased standing balance, hemiplegia and decreased balance strategies.  Prior to hospitalization, patient was modified independent  with mobility and lived with Alone in a [place] home.  Home access is 3Stairs to enter.  Patient will benefit from skilled PT intervention to maximize safe functional mobility, minimize fall risk and decrease caregiver burden for planned discharge home alone.  Anticipate patient will benefit from follow up [place] at discharge.  PT - End of Session Activity Tolerance: Tolerates 30+ min activity with multiple rests Endurance Deficit: Yes PT Assessment Rehab Potential (ACUTE/IP ONLY): Excellent PT Barriers to Discharge: Decreased caregiver support PT Patient demonstrates impairments in the following area(s): Balance;Behavior;Edema;Endurance;Motor;Sensory;Skin Integrity PT Transfers Functional Problem(s): Bed Mobility;Bed to Chair;Car;Furniture;Floor PT Locomotion Functional Problem(s): Ambulation;Wheelchair Mobility;Stairs PT Plan PT Intensity: Minimum of 1-2 x/day ,45 to 90 minutes PT Frequency: 5 out of 7 days PT Duration Estimated Length of Stay: 5-7 days PT Treatment/Interventions: Ambulation/gait training;Balance/vestibular training;Community reintegration;Cognitive remediation/compensation;Discharge planning;Disease management/prevention;DME/adaptive equipment instruction;Functional electrical stimulation;Functional mobility training;Neuromuscular re-education;Pain management;Patient/family education;Psychosocial support;Skin care/wound management;Stair training;Splinting/orthotics;Therapeutic Exercise;Therapeutic Activities;UE/LE Coordination activities;Wheelchair propulsion/positioning;Visual/perceptual remediation/compensation;UE/LE Strength taining/ROM PT Transfers Anticipated Outcome(s): mod I with LRAD PT Locomotion Anticipated Outcome(s): Mod I ambulatory with  LRAD PT Recommendation Follow Up Recommendations: Home health PT Patient destination: Home Equipment Recommended: To be determined  Skilled Therapeutic Intervention Pt received supine in bed and agreeable to PT. Supine>sit transfer with supevision assist and min cues for decreased use of bed features. PT instructed patient in PT Evaluation and initiated treatment intervention; see below for results. PT educated patient in [place], rehab potential, rehab goals, and discharge recommendations. Patient demonstrates increased fall risk as noted by score of   42/56 on Berg Balance Scale.  (<36= high risk for falls, close to 100%; 37-45 significant >80%; 46-51 moderate >50%; 52-55 lower >25%).  Gait training without AD x 196f with min assist, mild ataxia which increased with distractions. Stair management training with min assist as listed below. Car training training with min assist and min cues for safety. [facility] to [facility] and all needs met.        PT Evaluation Precautions/Restrictions Precautions Precautions: Fall Precaution Comments: Lt hemi, Lt inattention Pain Pain Assessment Pain Scale: 0-10 Pain Score: 0-No pain Home Living/Prior Functioning Home Living Available Help at Discharge: Friend(s);Available PRN/intermittently Type of Home: Apartment Home Access: Stairs to enter Entrance Stairs-Number of Steps: 3 Entrance Stairs-Rails: Right Home Layout: One level Bathroom Shower/Tub: TChiropodist Standard Bathroom Accessibility: Yes  Lives With: Alone Prior Function Level of Independence: Independent with basic ADLs;Independent with homemaking with ambulation;Independent with transfers  Able to Take Stairs?: Yes Driving: No Vision/Perception  Perception Perception: Impaired Inattention/Neglect: Does not attend to left visual field Praxis Praxis: Intact  Cognition Overall Cognitive Status:  Impaired/Different from baseline Arousal/Alertness: Awake/alert Orientation Level: Oriented X4 Attention: Sustained Sustained Attention: Impaired Sustained Attention Impairment: Verbal complex;Functional basic Selective Attention: Impaired Selective Attention Impairment: Functional basic;Verbal complex Memory: Impaired Memory Impairment:  Decreased short term memory Decreased Short Term Memory: Verbal basic Immediate Memory Recall: Sock;Blue;Bed Memory Recall Sock: Without Cue Memory Recall Blue: Without Cue Memory Recall Bed: Without Cue Awareness: Impaired Awareness Impairment: Emergent impairment Problem Solving: Impaired Problem Solving Impairment: Functional complex Executive Function: Decision Making Decision Making: Appears intact Safety/Judgment: Appears intact Sensation Sensation Light Touch: Appears Intact Coordination Gross Motor Movements are Fluid and Coordinated: No Fine Motor Movements are Fluid and Coordinated: No Coordination and Movement Description: Mild Lt hemi, impaired fine motor control on the Lt side Heel Shin Test: mild ataxia on the L Motor  Motor Motor: Hemiplegia;Abnormal postural alignment and control;Ataxia Motor - Skilled Clinical Observations: mild L sided hemiplegia and ataxia in the LE  Mobility Bed Mobility Bed Mobility: Rolling Left;Rolling Right;Sit to Supine;Supine to Sit Rolling Right: Supervision/verbal cueing Rolling Left: Supervision/Verbal cueing Supine to Sit: Supervision/Verbal cueing Sit to Supine: Supervision/Verbal cueing Transfers Transfers: Sit to Stand;Stand Pivot Transfers Sit to Stand: Minimal Assistance - Patient > 75% Stand Pivot Transfers: Minimal Assistance - Patient > 75% Transfer (Assistive device): None Locomotion  Gait Ambulation: Yes Gait Assistance: Minimal Assistance - Patient > 75% Gait Distance (Feet): 100 Feet Assistive device: None Gait Gait: Yes Gait Pattern: Impaired Gait Pattern: Ataxic;Wide  base of support Stairs / Additional Locomotion Stairs: Yes Stairs Assistance: Minimal Assistance - Patient > 75% Stair Management Technique: Two rails Number of Stairs: 4 Height of Stairs: 6 Wheelchair Mobility Wheelchair Mobility: No  Trunk/Postural Assessment  Postural Control Postural Control: Deficits on evaluation(mild impairments noted during functional transfers without device)  Balance Balance Balance Assessed: Yes Standardized Balance Assessment Standardized Balance Assessment: Berg Balance Test Berg Balance Test Sit to Stand: Able to stand without using hands and stabilize independently Standing Unsupported: Able to stand safely 2 minutes Sitting with Back Unsupported but Feet Supported on Floor or Stool: Able to sit safely and securely 2 minutes Stand to Sit: Sits safely with minimal use of hands Transfers: Able to transfer safely, definite need of hands Standing Unsupported with Eyes Closed: Able to stand 10 seconds with supervision Standing Ubsupported with Feet Together: Able to place feet together independently and stand for 1 minute with supervision From Standing, Reach Forward with Outstretched Arm: Can reach confidently >25 cm (10") From Standing Position, Pick up Object from Floor: Able to pick up shoe, needs supervision From Standing Position, Turn to Look Behind Over each Shoulder: Looks behind from both sides and weight shifts well Turn 360 Degrees: Able to turn 360 degrees safely but slowly Standing Unsupported, Alternately Place Feet on Step/Stool: Able to complete >2 steps/needs minimal assist Standing Unsupported, One Foot in Front: Able to take small step independently and hold 30 seconds Standing on One Leg: Tries to lift leg/unable to hold 3 seconds but remains standing independently Total Score: 42 Static Sitting Balance Static Sitting - Level of Assistance: 6: Modified independent (Device/Increase time) Dynamic Sitting Balance Dynamic Sitting - Level  of Assistance: 5: Stand by assistance Static Standing Balance Static Standing - Level of Assistance: 5: Stand by assistance Dynamic Standing Balance Dynamic Standing - Level of Assistance: 4: Min assist Extremity Assessment  RUE Assessment RUE Assessment: Within Functional Limits Active Range of Motion (AROM) Comments: WNL LUE Assessment LUE Assessment: Exceptions to WFL(impaired [facility]) Active Range of Motion (AROM) Comments: WNL RLE Assessment RLE Assessment: Within Functional Limits General Strength Comments: grosslt 4+/5 to 5/5 LLE Assessment LLE Assessment: Exceptions to [facility] General Strength Comments: grosslt 4+/5 proximal to distal    Refer to Care  Plan for Long Term Goals  Recommendations for other services: Therapeutic Recreation  Stress management and Outing/community reintegration  Discharge Criteria: Patient will be discharged from PT if patient refuses treatment 3 consecutive times without medical reason, if treatment goals not met, if there is a change in medical status, if patient makes no progress towards goals or if patient is discharged from hospital.  The above assessment, treatment plan, treatment alternatives and goals were discussed and mutually agreed upon: by patient  Lorie Phenix 01/31/2020, 3:19 PM

## 2020-01-31 NOTE — Progress Notes (Signed)
Patient has been moved from 4M08-1 to 4W10. Belongings have been packed with patient. Report given to [facility], Charity fundraiser. Leane Para, LPN

## 2020-01-31 NOTE — Plan of Care (Signed)
  Problem: Consults Goal: RH STROKE PATIENT EDUCATION Description: See Patient Education module for education specifics  Outcome: Progressing Goal: Nutrition Consult-if indicated Outcome: Progressing   Problem: RH SKIN INTEGRITY Goal: RH STG SKIN FREE OF INFECTION/BREAKDOWN Description: Patient will not have skin breakdown at discharge. Outcome: Progressing Goal: RH STG MAINTAIN SKIN INTEGRITY WITH ASSISTANCE Description: STG Maintain Skin Integrity With  mod I  Outcome: Progressing   Problem: RH SAFETY Goal: RH STG ADHERE TO SAFETY PRECAUTIONS W/ASSISTANCE/DEVICE Description: STG Adhere to Safety Precautions With  min Assistance/Device. Outcome: Progressing   Problem: RH PAIN MANAGEMENT Goal: RH STG PAIN MANAGED AT OR BELOW PT'S PAIN GOAL Description: Less than 3 Outcome: Progressing   Problem: RH KNOWLEDGE DEFICIT Goal: RH STG INCREASE KNOWLEDGE OF HYPERTENSION Description: Patient will verbalize understanding of how to manage hypertension with medications Outcome: Progressing Goal: RH STG INCREASE KNOWLEGDE OF HYPERLIPIDEMIA Description: Patent will verbalize understanding of how to manage hyperlipidemia with medications prior to discharge. Outcome: Progressing

## 2020-01-31 NOTE — Progress Notes (Signed)
[place] PHYSICAL MEDICINE & REHABILITATION PROGRESS NOTE   Subjective/Complaints:  No issues overnite   ROS- neg CP, SOB, N/V/D poor sleep   Objective:   VAS [place] TRANSCRANIAL DOPPLER W BUBBLES  Result Date: 01/29/2020  Transcranial Doppler with Bubble Indications: Stroke. Comparison Study: No prior study Performing Technologist: Gertie Fey MHA, RDMS, RVT, RDCS  Examination Guidelines: A complete evaluation includes B-mode imaging, spectral Doppler, color Doppler, and power Doppler as needed of all accessible portions of each vessel. Bilateral testing is considered an integral part of a complete examination. Limited examinations for reoccurring indications may be performed as noted.  Summary:  A vascular evaluation was performed. The right middle cerebral artery was studied. An IV was inserted into the patient's left forearm. Verbal informed consent was obtained.  No HITs (high intensity transient signals) were observed within 2-3 cardiac cycles at rest or with valsalva maneuver, indicating no evidence of intracardiac right to left shunt. However, there was evidence of delayed HITS at rest and with valsalva maneuver, with significant longer duration (up to 9 min), suggestive of possible intrapulmonary shunt. Further imaging like CTA chest may be warranted if clinically indicated. *See table(s) above for TCD measurements and observations.  Diagnosing physician: Marvel Plan MD Electronically signed by Marvel Plan MD on 01/29/2020 at 7:30:08 PM.    Final    Recent Labs    01/31/20 0504  WBC 8.1  HGB 15.7  HCT 46.9  PLT 190   Recent Labs    01/31/20 0504  NA 138  K 3.4*  CL 105  CO2 23  GLUCOSE 99  BUN 13  CREATININE 0.94  CALCIUM 8.8*    Intake/Output Summary (Last 24 hours) at 01/31/2020 0724 Last data filed at 01/31/2020 0150 Gross per 24 hour  Intake 120 ml  Output 650 ml  Net -530 ml     Physical Exam: Vital Signs Blood pressure 130/85, pulse (!) 55, temperature  97.6 F (36.4 C), temperature source Oral, resp. rate 16, height 6\' 1"  (1.854 m), weight 104.9 kg, SpO2 99 %.   General: No acute distress Mood and affect are appropriate Heart: Regular rate and rhythm no rubs murmurs or extra sounds Lungs: Clear to auscultation, breathing unlabored, no rales or wheezes Abdomen: Positive bowel sounds, soft nontender to palpation, nondistended Extremities: No clubbing, cyanosis, or edema Skin: No evidence of breakdown, no evidence of rash Neurologic: Cranial nerves II through XII intact, motor strength is 5/5 in RIght 4/5 left  deltoid, bicep, tricep, grip, hip flexor, knee extensors, ankle dorsiflexor and plantar flexor Sensory exam normal sensation to light touch and proprioception in bilateral upper and lower extremities Cerebellar exam normal finger to nose to finger as well as heel to shin in bilateral upper and lower extremities Musculoskeletal: Full range of motion in all 4 extremities. No joint swelling   Assessment/Plan: 1. 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  Physiatrist is providing close team supervision and 24 hour management of active medical problems listed below.  Physiatrist and rehab team continue to assess barriers to discharge/monitor patient progress toward functional and medical goals  Care Tool:  Bathing              Bathing assist       Upper Body Dressing/Undressing Upper body dressing   What is the patient wearing?: Hospital gown only    Upper body assist Assist Level: Supervision/Verbal cueing  Lower Body Dressing/Undressing Lower body dressing      What is the patient wearing?: Underwear/pull up     Lower body assist Assist for lower body dressing: Minimal Assistance - Patient > 75%     Toileting Toileting    Toileting assist Assist for toileting: Independent with assistive  device Assistive Device Comment: (urinal)   Transfers Chair/bed transfer  Transfers assist     Chair/bed transfer assist level: Contact Guard/Touching assist     Locomotion Ambulation   Ambulation assist              Walk 10 feet activity   Assist           Walk 50 feet activity   Assist           Walk 150 feet activity   Assist           Walk 10 feet on uneven surface  activity   Assist           Wheelchair     Assist               Wheelchair 50 feet with 2 turns activity    Assist            Wheelchair 150 feet activity     Assist          Blood pressure 130/85, pulse (!) 55, temperature 97.6 F (36.4 C), temperature source Oral, resp. rate 16, height 6\' 1"  (1.854 m), weight 104.9 kg, SpO2 99 %.  Medical Problem List and Plan: 1.  Impaired function secondary to R MCA CVA             -patient may  shower             -ELOS/Goals: 10-14 days; has to be Mod I- going home alone; daughter has 3 children and cannot help much Initial PT, OT evals today 2.  Antithrombotics: -DVT/anticoagulation:  Pharmaceutical: Lovenox             -antiplatelet therapy: DAPT X 3 months followed by ASA alone.  3. Pain Management: N/A 4. Mood: LCSW to follow for evaluation and support.              -antipsychotic agents: N/A 5. Neuropsych: This patient is capable of making decisions on his own behalf. 6. Skin/Wound Care: Routine pressure relief measures.  7. Fluids/Electrolytes/Nutrition:  Monitor I/O. Check lytes in am. 8. PVD s/p FPBG/multiple toe amp: Smokes 1-2 PPD. He has agreed to stop smoking/quit tobacco use. 9. Polycythemia: has been in 18,000 range since 2014 per records. Likely due to tobacco use. Continue to monitor.  10. HTN: Monitor BP tid--permissive HTN for a couple more days.  Vitals:   01/31/20 0519  BP: 130/85  Pulse: (!) 55  Resp: 16  Temp: 97.6 F (36.4 C)  SpO2: 99%   11. B12 deficiency: IM  supplement on 03/17 and now on oral supplement.  12. Intermittent bradycardia: Reported to have HR in 30's on telemetry --recs for 30 day outpatient cardiac monitor.  13. Leucocytosis: Monitor for signs of infection. Recheck CBC in am.  14. L inattention- will see if possible to set room up to help 15. Dispo             -pt initially agreed to semi-private room; now says has to have private room- will attempt to change tomorrow.   LOS: 1 days A FACE TO FACE EVALUATION WAS PERFORMED  Charlett Blake 01/31/2020, 7:24  AM    

## 2020-01-31 NOTE — Evaluation (Signed)
Speech Language Pathology Assessment and Plan  Patient Details  Name: Cody Baird MRN: [id number] Date of Birth: 1961-09-06  SLP Diagnosis: Cognitive Impairments  Rehab Potential: Excellent ELOS: 7-9 days    Today's Date: 01/31/2020 SLP Individual Time: 5993-5701 SLP Individual Time Calculation (min): 55 min   Problem List:  Patient Active Problem List   Diagnosis Date Noted  . Acute right MCA stroke ([place]) 01/30/2020  . B12 deficiency   . Bradycardia 01/29/2020  . Tobacco abuse 01/28/2020  . Essential hypertension 01/28/2020  . TIA (transient ischemic attack) 01/28/2020  . Stroke ([place]) 01/28/2020  . CVA (cerebral vascular accident) ([place]) 01/28/2020  . Aftercare following surgery of the circulatory system, [place] 01/14/2014  . Ischemic pain of foot 12/24/2013  . Atherosclerotic peripheral vascular disease with rest pain ([place]) 10/30/2013  . Post-op pain 09/17/2013  . PVD (peripheral vascular disease) ([place]) 09/17/2013  . Post-operative pain 07/16/2013  . 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 Peripheral vascular disease, unspecified ([place]) 06/25/2013  . Pain in limb 06/25/2013   Past Medical History:  Past Medical History:  Diagnosis Date  . Arthritis    low back and left hip  . B12 deficiency   . Bradycardia   . Chronic back pain   . Headache(784.0)    occasionally and sinus related   . History of bronchitis    last time about 2-34yr ago  . PAD (peripheral artery disease) ([place]   . [place]ipheral vascular disease ([place]   . Prolapsed, anus    hx of  . Stroke ([facility] 01/2020   Past Surgical History:  Past Surgical History:  Procedure Laterality Date  . ABDOMINAL AORTAGRAM N/A 06/26/2013   Procedure: ABDOMINAL AORTAGRAM;  Surgeon: TRosetta Posner MD;  [facility];  Service: Cardiovascular;  Laterality: N/A;  . AMPUTATION Right 12/04/2013   Procedure: AMPUTATION DIGIT- 3RD TOE RIGHT FOOT ;  Surgeon: TRosetta Posner  MD;  [place] [facility]  Service: Vascular;  Laterality: Right;  . AMPUTATION Right 01/15/2014   Procedure: AMPUTATION OF RIGHT 4TH and 5TH TOES;  Surgeon: TRosetta Posner MD;  [facility]  Service: Vascular;  Laterality: Right;  . BACK SURGERY     L1-2  . BACK SURGERY     L1-2  . BACK SURGERY     thinks L1- 2  . COLON SURGERY     removal of colon due to proplase rectum  . COLOSTOMY    . COLOSTOMY CLOSURE  2006ish  . ESOPHAGOGASTRODUODENOSCOPY    . FEMORAL-TIBIAL BYPASS GRAFT Right 06/28/2013   Procedure: BYPASS GRAFT FEMORAL TO TIBIAL PERONEAL TRUNK ARTERY;  Surgeon: TRosetta Posner MD;  [facility];  Service: Vascular;  Laterality: Right;    Assessment / Plan / Recommendation Clinical Impression   HPI: WKVON MCILHENNYis a 59year old male with history of PAD/PVD, ongoing tobacco use 2 PPD, chronic back pain who was admitted on 01/27/20 with progressive left sided weakness and slurred speech. CTA head/neck showed moderate to moderately advanced stenosis distal M1 right MCA and high grade focal stenosis A3-A4 left ACA branch as well as pulmonary emphysema. MRI brain showed multifocal acute/subacute infarcts within anterior right MCA territory. 2D echo showed EF 60-65% without wall abnormality. Workup also revealed pesistent polycythemia and low vitamin B12 levels. TEE was negative for PFO but showed small insignificant pulmonary shunt.  BLE dopplers negative for DVT.  He has had asymptomatic bradycardia with 3.5 second  pause and stroke felt to be embolic due to uncertain etiology. 30 day cardiac monitor recommended as outpatient to rule out A fib. Dr. Erlinda Hong recommends DAPT x 3 months followed by ASA alone. Therapy ongoing and patient continues to have limitations in mobility and ADLs due to left sided weakness. CIR recommended due to functional decline on 01/30/20 and SLP evaluation was completed 01/31/20 with results as follows:  Swallow screen conducted while pt consuming regular texture breakfast  and thin liquids - NO ST indicated for swallowing.  Cognitive-Linguistic Evaluation: Pt presents with mild cognitive deficits characterized by decreased selective attention to functional tasks, as well as deficits in short term memory and complex problem solving. Pt scored WFL on Cognistat on all subtests with the exception of visual construction subtest indicative of mild deficits and short term recall indicative of mild to moderate impairment. Pt required cueing to selectively attention to PO intake and conversational topic maintenance during breakfast. Pt's speech is 100% intelligible without presence of dysarthria and expressive/receptive language skills determine WNL.  Pt would benefit from skilled ST while in patient to address higher level cognitive deficits as described above in order to maximize his safety and functional independence at discharge, particularly since pt will be going home alone with only intermittent supervision from family. Pt in agreement.    Skilled Therapeutic Interventions          Swallow screen and cognitive-linguistic evaluations were completed and results were shared with pt (see above for details).   SLP Assessment  Patient will need skilled [place] [facility] during CIR admission    Recommendations  Oral Care Recommendations: Oral care BID Patient destination: Home Follow up Recommendations: None Equipment Recommended: None recommended by SLP    SLP Frequency 3 to 5 out of 7 days   SLP Duration  SLP Intensity  SLP Treatment/Interventions 7-9 days  Minumum of 1-2 x/day, 30 to 90 minutes  Cognitive remediation/compensation;Cueing hierarchy;Functional tasks;Internal/external aids;Patient/family education    Pain Pain Assessment Pain Scale: 0-10 Pain Score: 0-No pain  Prior Functioning Type of Home: Apartment  Lives With: Alone Available Help at Discharge: Friend(s);Available PRN/intermittently  SLP Evaluation Cognition Overall  Cognitive Status: Impaired/Different from baseline Arousal/Alertness: Awake/alert Orientation Level: Oriented X4 Attention: Sustained Sustained Attention: Impaired Sustained Attention Impairment: Verbal complex;Functional basic Memory: Impaired Memory Impairment: Decreased short term memory Decreased Short Term Memory: Verbal basic Immediate Memory Recall: Sock;Blue;Bed Memory Recall Sock: Without Cue Memory Recall Blue: Without Cue Memory Recall Bed: Without Cue Awareness: Impaired Awareness Impairment: Emergent impairment Problem Solving: Impaired Problem Solving Impairment: Functional complex Executive Function: Decision Making Decision Making: Appears intact Safety/Judgment: Appears intact  Comprehension Auditory Comprehension Overall Auditory Comprehension: Appears within functional limits for tasks assessed Commands: Within Functional Limits Conversation: Complex Visual Recognition/Discrimination Discrimination: Within Function Limits Reading Comprehension Reading Status: Not tested Expression Expression Primary Mode of Expression: Verbal Verbal Expression Overall Verbal Expression: Appears within functional limits for tasks assessed Initiation: No impairment Repetition: No impairment Naming: No impairment Pragmatics: No impairment Non-Verbal Means of Communication: Not applicable Written Expression Dominant Hand: Right Written Expression: Not tested Oral Motor Oral Motor/Sensory Function Overall Oral Motor/Sensory Function: Mild impairment Facial ROM: Within Functional Limits Facial Symmetry: Abnormal symmetry left Facial Strength: Within Functional Limits Facial Sensation: Within Functional Limits Lingual ROM: Within Functional Limits Lingual Symmetry: Within Functional Limits Lingual Strength: Within Functional Limits Lingual Sensation: Within Functional Limits Velum: Within Functional Limits Mandible: Within Functional Limits Motor Speech Overall  Motor Speech: Appears within functional limits for  tasks assessed Respiration: Within functional limits Phonation: Normal Resonance: Within functional limits Articulation: Within functional limitis Intelligibility: Intelligible Motor Planning: Witnin functional limits Motor Speech Errors: Not applicable   PMSV Trial Intelligibility: Intelligible      Short Term Goals: Week 1: SLP Short Term Goal 1 (Week 1): STG=LTG due to estimted short length of stay  Refer to Care Plan for Long Term Goals  Recommendations for other services: None   Discharge Criteria: Patient will be discharged from SLP if patient refuses treatment 3 consecutive times without medical reason, if treatment goals not met, if there is a change in medical status, if patient makes no progress towards goals or if patient is discharged from hospital.  The above assessment, treatment plan, treatment alternatives and goals were discussed and mutually agreed upon: by patient  Arbutus Leas 01/31/2020, 12:38 PM

## 2020-01-31 NOTE — Progress Notes (Signed)
Physical Therapy Session Note  Patient Details  Name: Cody Baird MRN: [id number] Date of Birth: [birth year]  Today's Date: 01/31/2020 PT Individual Time: 1700-1725 PT Individual Time Calculation (min): 25 min   Short Term Goals: Week 1:  PT Short Term Goal 1 (Week 1): STG=LTG due to ELOS  Skilled Therapeutic Interventions/Progress Updates:  Pt received supine in bed and agreeable to PT. Supine>sit transfer with supervision assist with cues for safety and decreased use of bed rail. PT instructed pt in standardized outcome assessments.   TUG without AD: 18 sec, average of 3 trials. (<13.5 sec indicates fall risk) 5xSTS:   16 average of 3 trials. ( >15 seconds indicates fall risk ) CGA from PT throughout balance assessments. No overt LOB noted, but pt reports feeling "unsteady"  Pt requesting to fix blankets on bed sit<>stand with supervision assist and ambulatory transfer around EOB to remove and replace sheet. Cues for improved ROM and attention to the LUE while adjusting blanket. Pt reports need for urination. Ambulatory transfer to toilet for urination with min cues for safety over threshold. Patient returned to room and left sitting EOB with RN present, call bell in reach and all needs met.      Therapy Documentation Precautions:  Precautions Precautions: Fall Precaution Comments: Lt hemi, Lt inattention Restrictions Weight Bearing Restrictions: No    Vital Signs: Therapy Vitals Temp: 98.6 F (37 C) Temp Source: Oral Pulse Rate: 69 Resp: 17 BP: (!) 148/92 Patient Position (if appropriate): Sitting Oxygen Therapy SpO2: 97 % O2 Device: Room Air Pain: denies  Therapy/Group: Individual Therapy  Lorie Phenix 01/31/2020, 6:08 PM

## 2020-02-01 ENCOUNTER — Inpatient Hospital Stay (HOSPITAL_COMMUNITY): Payer: Self-pay | Admitting: Physical Therapy

## 2020-02-01 NOTE — Progress Notes (Signed)
[place] PHYSICAL MEDICINE & REHABILITATION PROGRESS NOTE   Subjective/Complaints:  Patient had some stomach upset earlier gives a history of rectal prolapse in the past which required partial bowel resection and colostomy which was subsequently reversed.  Has had problems with intermittent diarrhea since that time  ROS- neg CP, SOB, N/V/D poor sleep   Objective:   No results found. Recent Labs    01/31/20 0504  WBC 8.1  HGB 15.7  HCT 46.9  PLT 190   Recent Labs    01/31/20 0504  NA 138  K 3.4*  CL 105  CO2 23  GLUCOSE 99  BUN 13  CREATININE 0.94  CALCIUM 8.8*    Intake/Output Summary (Last 24 hours) at 02/01/2020 1442 Last data filed at 02/01/2020 0755 Gross per 24 hour  Intake 702 ml  Output --  Net 702 ml     Physical Exam: Vital Signs Blood pressure (!) 143/88, pulse (!) 103, temperature (!) 97.5 F (36.4 C), temperature source Oral, resp. rate 19, height 6\' 1"  (1.854 m), weight 104.9 kg, SpO2 97 %.    General: No acute distress Mood and affect are appropriate Heart: Regular rate and rhythm no rubs murmurs or extra sounds Lungs: Clear to auscultation, breathing unlabored, no rales or wheezes Abdomen: Positive bowel sounds, soft nontender to palpation, nondistended Extremities: No clubbing, cyanosis, or edema Skin: No evidence of breakdown, no evidence of rash Neurologic: Cranial nerves II through XII intact, motor strength is 5/5 in RIght 4/5 Left  bilateral deltoid, bicep, tricep, grip, hip flexor, knee extensors, ankle dorsiflexor and plantar flexor Sensory exam normal sensation to light touch and proprioception in bilateral upper and lower extremities Cerebellar exam normal finger to nose to finger as well as heel to shin in bilateral upper and lower extremities Musculoskeletal: Full range of motion in all 4 extremities. No joint swelling   Assessment/Plan: 1. Functional deficits secondary to RI MCA infarct  which require 3+ hours per day of  interdisciplinary therapy in a comprehensive inpatient rehab setting.  Physiatrist is providing close team supervision and 24 hour management of active medical problems listed below.  Physiatrist and rehab team continue to assess barriers to discharge/monitor patient progress toward functional and medical goals  Care Tool:  Bathing    Body parts bathed by patient: Right arm, Left arm, Chest, Abdomen, Front perineal area, Buttocks, Right upper leg, Left upper leg, Right lower leg, Left lower leg, Face         Bathing assist Assist Level: Contact Guard/Touching assist     Upper Body Dressing/Undressing Upper body dressing   What is the patient wearing?: Pull over shirt    Upper body assist Assist Level: Set up assist    Lower Body Dressing/Undressing Lower body dressing      What is the patient wearing?: Pants     Lower body assist Assist for lower body dressing: Minimal Assistance - Patient > 75%     Toileting Toileting    Toileting assist Assist for toileting: Contact Guard/Touching assist Assistive Device Comment: urinal   Transfers Chair/bed transfer  Transfers assist     Chair/bed transfer assist level: Minimal Assistance - Patient > 75%     Locomotion Ambulation   Ambulation assist      Assist level: Minimal Assistance - Patient > 75% Assistive device: No Device Max distance: 100   Walk 10 feet activity   Assist     Assist level: Minimal Assistance - Patient > 75% Assistive device: No  Device   Walk 50 feet activity   Assist    Assist level: Minimal Assistance - Patient > 75% Assistive device: No Device    Walk 150 feet activity   Assist Walk 150 feet activity did not occur: Safety/medical concerns         Walk 10 feet on uneven surface  activity   Assist     Assist level: Minimal Assistance - Patient > 75%     Wheelchair     Assist Will patient use wheelchair at discharge?: No   Wheelchair activity did not  occur: N/A         Wheelchair 50 feet with 2 turns activity    Assist    Wheelchair 50 feet with 2 turns activity did not occur: N/A       Wheelchair 150 feet activity     Assist  Wheelchair 150 feet activity did not occur: N/A       Blood pressure (!) 143/88, pulse (!) 103, temperature (!) 97.5 F (36.4 C), temperature source Oral, resp. rate 19, height 6\' 1"  (1.854 m), weight 104.9 kg, SpO2 97 %.  Medical Problem List and Plan: 1.  Impaired function secondary to R MCA CVA             -patient may  shower             -ELOS/Goals: 10-14 days; has to be Mod I- going home alone; daughter has 3 children and cannot help much Continue CIR PT OT 2.  Antithrombotics: -DVT/anticoagulation:  Pharmaceutical: Lovenox             -antiplatelet therapy: DAPT X 3 months followed by ASA alone.  3. Pain Management: N/A 4. Mood: LCSW to follow for evaluation and support.              -antipsychotic agents: N/A 5. Neuropsych: This patient is capable of making decisions on his own behalf. 6. Skin/Wound Care: Routine pressure relief measures.  7. Fluids/Electrolytes/Nutrition:  Monitor I/O. Check lytes in am. 8. PVD s/p FPBG/multiple toe amp: Smokes 1-2 PPD. He has agreed to stop smoking/quit tobacco use. 9. Polycythemia: has been in 18,000 range since 2014 per records. Likely due to tobacco use. Continue to monitor.  10. HTN: Monitor BP tid--permissive HTN for a couple more days.  Vitals:   01/31/20 1921 02/01/20 0412  BP: (!) 142/90 (!) 143/88  Pulse: 66 (!) 103  Resp: 18 19  Temp: 97.7 F (36.5 C) (!) 97.5 F (36.4 C)  SpO2: 97% 97%   11. B12 deficiency: IM supplement on 03/17 and now on oral supplement.  12. Intermittent bradycardia: Reported to have HR in 30's on telemetry --recs for 30 day outpatient cardiac monitor.  13. Leucocytosis: Monitor for signs of infection. Recheck CBC in am.  14. L inattention- will see if possible to set room up to help            LOS: 2  days A FACE TO FACE EVALUATION WAS PERFORMED  Charlett Blake 02/01/2020, 2:42 PM

## 2020-02-01 NOTE — Progress Notes (Signed)
Physical Therapy Session Note  Patient Details  Name: Cody Baird MRN: [id number] Date of Birth: 12/06/1960  Today's Date: 02/01/2020 PT Individual Time: 1415-1530 PT Individual Time Calculation (min): 75 min   Short Term Goals: Week 1:  PT Short Term Goal 1 (Week 1): STG=LTG due to ELOS  Skilled Therapeutic Interventions/Progress Updates:   Pt received supine in bed and agreeable to PT. Supine>sit transfer with supervision assist and cues for safety. Pt reports need for urination ambulatory transfer with RW and supervision assist.   Dynamic gait training forward/reverse, side stepping R and L, weave through 6 cones. Each one completed x 4 each direction with supervision assist.  Diona Foley toss off trampoline, standing on airex pad 15 forward/ 15 R/ 15 L. Supervision assist throughout with min cues for improved ROM and attention to the LUE to improve symmetry.   Reciprocal foot tap on 6 inch step, x 10 with BUE support and x 10 without UE support. Cues for improved weight shift to the R to allow increased step height on the L.   Dual task  Gait training to sustain grasp on empty cup x 264f and cup full of figurines x 2044f Min cues for improved attention to grasp on the L and to sustain elbow flexion to prevent spilling contents of cup. supervision assist throughout for safety, no AD, and no LOB noted.   Nustep reciprocal movement training 5 min +3 min with prolonged break between bouts.  Min-mod cues for full ROM on the L and improved attention to the LUE to prevent losing grasp on L handle.   Patient returned to room and left sitting EOB with call bell in reach and all needs met.             Therapy Documentation Precautions:  Precautions Precautions: Fall Precaution Comments: Lt hemi, Lt inattention Restrictions Weight Bearing Restrictions: No   Pain: denies   Therapy/Group: Individual Therapy  AuLorie Phenix/20/2021, 3:49 PM

## 2020-02-01 NOTE — IPOC Note (Signed)
Overall Plan of Care [facility]) Patient Details Name: Cody Baird MRN: [id number] DOB: [birth year]  Admitting Diagnosis: <principal problem not specified>  Hospital Problems: Active Problems:   Acute right MCA stroke ([place])     Functional Problem List: Nursing Behavior, Medication Management, Nutrition, Pain, Safety  PT Balance, Behavior, Edema, Endurance, Motor, Sensory, Skin Integrity  OT Balance, Perception, Cognition, Endurance, Motor, Pain  SLP Cognition  TR         Basic ADL's: OT Grooming, Bathing, Dressing, Toileting     Advanced  ADL's: OT Simple Meal Preparation, Laundry     Transfers: PT Bed Mobility, Bed to Chair, Car, [employer], Futures trader, Metallurgist: PT Ambulation, Emergency planning/management officer, Stairs     Additional Impairments: OT Fuctional Use of Upper Extremity  SLP Social Cognition   Problem Solving, Memory, Attention, Awareness  TR      Anticipated Outcomes Item Anticipated Outcome  Self Feeding No goal  Swallowing      Basic self-care  Mod I  Toileting  Mod I   Bathroom Transfers Mod I  Bowel/Bladder  remain continent bowel and bladder  Transfers  mod I with LRAD  Locomotion  Mod I ambulatory with LRAD  Communication     Cognition  Mod I  Pain  less than 3  Safety/Judgment  mod I   Therapy Plan: PT Intensity: Minimum of 1-2 x/day ,45 to 90 minutes PT Frequency: 5 out of 7 days PT Duration Estimated Length of Stay: 5-7 days OT Intensity: Minimum of 1-2 x/day, 45 to 90 minutes OT Frequency: 5 out of 7 days OT Duration/Estimated Length of Stay: 7-10 days SLP Intensity: Minumum of 1-2 x/day, 30 to 90 minutes SLP Frequency: 3 to 5 out of 7 days SLP Duration/Estimated Length of Stay: 7-9 days   Due to the current state of emergency, patients may not be receiving their 3-hours of Medicare-mandated therapy.   Team Interventions: Nursing Interventions Patient/Family Education, Disease Management/Prevention, Pain  Management, Medication Management, Discharge Planning  PT interventions Ambulation/gait training, Training and development officer, Community reintegration, Cognitive remediation/compensation, Discharge planning, Disease management/prevention, DME/adaptive equipment instruction, Functional electrical stimulation, Functional mobility training, Neuromuscular re-education, Pain management, Patient/family education, Psychosocial support, Skin care/wound management, Stair training, Splinting/orthotics, Therapeutic Exercise, Therapeutic Activities, UE/LE Coordination activities, Wheelchair propulsion/positioning, Visual/perceptual remediation/compensation, UE/LE Strength taining/ROM  OT Interventions Balance/vestibular training, Discharge planning, Pain management, Self Care/advanced ADL retraining, Therapeutic Activities, UE/LE Coordination activities, Visual/perceptual remediation/compensation, Therapeutic Exercise, Patient/family education, Functional mobility training, Disease mangement/prevention, Cognitive remediation/compensation, Academic librarian, Engineer, drilling, Neuromuscular re-education, Psychosocial support, UE/LE Strength taining/ROM, Wheelchair propulsion/positioning  SLP Interventions Cognitive remediation/compensation, English as a second language teacher, Functional tasks, Internal/external aids, Patient/family education  TR Interventions    SW/CM Interventions Discharge Planning, Patient/Family Education, Psychosocial Support, Disease Management/Prevention   Barriers to Discharge MD  Medical stability  Nursing Medication compliance    PT Decreased caregiver support    OT Lack of/limited family support    SLP      SW Decreased caregiver support 2 step entry to apt   Team Discharge Planning: Destination: PT-Home ,OT- Home , SLP-Home Projected Follow-up: PT-Home health PT, OT-  Home health OT, SLP-None Projected Equipment Needs: PT-To be determined, OT- To be determined, SLP-None  recommended by SLP Equipment Details: PT- , OT-  Patient/family involved in discharge planning: PT- Patient,  OT-Patient, SLP-Patient  MD ELOS: 7-10d Medical Rehab Prognosis:  Good Assessment:   59 year old male with history of PAD/PVD, ongoing tobacco use 2 PPD, chronic back  pain who was admitted on 01/27/20 with progressive left sided weakness and slurred speech. CTA head/neck showed moderate to moderately advanced stenosis distal M1 right MCA and high grade focal stenosis A3-A4 left ACA branch as well as pulmonary emphysema. MRI brain showed multifocal acute/subacute infarcts within anterior right MCA territory. 2D echo showed EF 60-65% without wall abnormality. Workup also revealed pesistent polycythemia and low vitamin B12 levels. TEE was negative for PFO but showed small insignificant pulmonary shunt.  BLE dopplers negative for DVT.  He has had asymptomatic bradycardia with 3.5 second pause and stroke felt to be embolic due to uncertain etiology. 30 day cardiac monitor recommended as outpatient to rule out A fib. Dr. Roda Shutters recommends DAPT x 3 months followed by ASA alone. Therapy ongoing and patient continues to have limitations in mobility and ADLs due to left sided weakness. CIR recommended due to functional decline   Now requiring 24/7 Rehab RN,MD, as well as CIR level PT, OT and SLP.  Treatment team will focus on ADLs and mobility with goals set at Mod I See Team Conference Notes for weekly updates to the plan of care

## 2020-02-02 ENCOUNTER — Inpatient Hospital Stay (HOSPITAL_COMMUNITY): Payer: Self-pay | Admitting: Speech Pathology

## 2020-02-02 ENCOUNTER — Inpatient Hospital Stay (HOSPITAL_COMMUNITY): Payer: Self-pay | Admitting: Occupational Therapy

## 2020-02-02 MED ORDER — POTASSIUM CHLORIDE CRYS ER 20 MEQ PO TBCR
30.0000 meq | EXTENDED_RELEASE_TABLET | Freq: Once | ORAL | Status: AC
  Administered 2020-02-02: 30 meq via ORAL
  Filled 2020-02-02: qty 1

## 2020-02-02 MED ORDER — LOPERAMIDE HCL 2 MG PO CAPS
2.0000 mg | ORAL_CAPSULE | Freq: Once | ORAL | Status: AC
  Administered 2020-02-02: 2 mg via ORAL
  Filled 2020-02-02: qty 1

## 2020-02-02 NOTE — Progress Notes (Signed)
[facility] PHYSICAL MEDICINE & REHABILITATION PROGRESS NOTE   Subjective/Complaints:  Stomach upset has improved, patient was walking in the hall with the therapist using walker.  ROS- neg CP, SOB, N/V/D poor sleep   Objective:   No results found. Recent Labs    01/31/20 0504  WBC 8.1  HGB 15.7  HCT 46.9  PLT 190   Recent Labs    01/31/20 0504  NA 138  K 3.4*  CL 105  CO2 23  GLUCOSE 99  BUN 13  CREATININE 0.94  CALCIUM 8.8*    Intake/Output Summary (Last 24 hours) at 02/02/2020 1501 Last data filed at 02/02/2020 0700 Gross per 24 hour  Intake 822 ml  Output --  Net 822 ml     Physical Exam: Vital Signs Blood pressure (!) 138/94, pulse 67, temperature 97.9 F (36.6 C), temperature source Oral, resp. rate 17, height 6\' 1"  (1.854 m), weight 104.9 kg, SpO2 98 %.   General: No acute distress Mood and affect are appropriate Heart: Regular rate and rhythm no rubs murmurs or extra sounds Lungs: Clear to auscultation, breathing unlabored, no rales or wheezes Abdomen: Positive bowel sounds, soft nontender to palpation, nondistended Extremities: No clubbing, cyanosis, or edema Skin: No evidence of breakdown, no evidence of rash Neurologic:  motor strength is 5/5 in RIght 4/5 Left  bilateral deltoid, bicep, tricep, grip, hip flexor, knee extensors, ankle dorsiflexor and plantar flexor  Musculoskeletal: Full range of motion in all 4 extremities. No joint swelling   Assessment/Plan: 1. 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  Physiatrist is providing close team supervision and 24 hour management of active medical problems listed below.  Physiatrist and rehab team continue to assess barriers to discharge/monitor patient progress toward functional and medical goals  Care Tool:  Bathing    Body parts bathed by patient: Right arm, Left arm, Chest,  Abdomen, Buttocks, Front perineal area, Right upper leg, Left upper leg, Right lower leg, Left lower leg, Face         Bathing assist Assist Level: Supervision/Verbal cueing     Upper Body Dressing/Undressing Upper body dressing   What is the patient wearing?: Pull over shirt    Upper body assist Assist Level: Independent    Lower Body Dressing/Undressing Lower body dressing      What is the patient wearing?: Pants, Underwear/pull up     Lower body assist Assist for lower body dressing: Supervision/Verbal cueing     Toileting Toileting    Toileting assist Assist for toileting: Contact Guard/Touching assist Assistive Device Comment: urinal   Transfers Chair/bed transfer  Transfers assist     Chair/bed transfer assist level: Supervision/Verbal cueing     Locomotion Ambulation   Ambulation assist      Assist level: Supervision/Verbal cueing Assistive device: No Device Max distance: 200   Walk 10 feet activity   Assist     Assist level: Supervision/Verbal cueing Assistive device: No Device   Walk 50 feet activity   Assist    Assist level: Supervision/Verbal cueing Assistive device: No Device    Walk 150 feet activity   Assist Walk 150 feet activity did not occur: Safety/medical concerns  Assist level: Supervision/Verbal cueing Assistive device: No Device    Walk 10 feet on uneven surface  activity   Assist     Assist level: Supervision/Verbal cueing     Wheelchair  Assist Will patient use wheelchair at discharge?: No   Wheelchair activity did not occur: N/A         Wheelchair 50 feet with 2 turns activity    Assist    Wheelchair 50 feet with 2 turns activity did not occur: N/A       Wheelchair 150 feet activity     Assist  Wheelchair 150 feet activity did not occur: N/A       Blood pressure (!) 138/94, pulse 67, temperature 97.9 F (36.6 C), temperature source Oral, resp. rate 17, height 6\' 1"   (1.854 m), weight 104.9 kg, SpO2 98 %.  Medical Problem List and Plan: 1.  Impaired function secondary to R MCA CVA             -patient may  shower             -ELOS/Goals: 10-14 days; has to be Mod I- going home alone; daughter has 3 children and cannot help much Continue CIR PT OT 2.  Antithrombotics: -DVT/anticoagulation:  Pharmaceutical: Lovenox             -antiplatelet therapy: DAPT X 3 months followed by ASA alone.  3. Pain Management: N/A 4. Mood: LCSW to follow for evaluation and support.              -antipsychotic agents: N/A 5. Neuropsych: This patient is capable of making decisions on his own behalf. 6. Skin/Wound Care: Routine pressure relief measures.  7. Fluids/Electrolytes/Nutrition:  Monitor I/O. Check lytes in am. 8. PVD s/p FPBG/multiple toe amp: Smokes 1-2 PPD. He has agreed to stop smoking/quit tobacco use. 9. Polycythemia: has been in 18,000 range since 2014 per records. Likely due to tobacco use. Continue to monitor.  10. HTN: Monitor BP tid--permissive HTN for a couple more days.  Vitals:   02/01/20 2046 02/02/20 0352  BP: (!) 143/84 (!) 138/94  Pulse: 75 67  Resp: 17 17  Temp: 97.6 F (36.4 C) 97.9 F (36.6 C)  SpO2: 95% 98%  Will monitor before starting any antihypertensive medications, would consider ACE inhibitor 11. B12 deficiency: IM supplement on 03/17 and now on oral supplement.  12. Intermittent bradycardia: Reported to have HR in 30's on telemetry --recs for 30 day outpatient cardiac monitor.  13. Leucocytosis: Monitor for signs of infection. Recheck CBC in am.  14. L inattention- will see if possible to set room up to help #15.  Hypokalemia, mild we will give one-time dose of KCl and recheck tomorrow           LOS: 3 days A FACE TO FACE EVALUATION WAS PERFORMED  4/17 02/02/2020, 3:01 PM

## 2020-02-02 NOTE — Progress Notes (Signed)
+/-   sleep. Took PRN ambien Friday night, reports made  him feel groggy. Intermittent diarrhea and mushy stools since yesterday. PRN maalox given at 0450, for complaint of "sour stomach." PRN tylenol given at 2000, RUE sore from flu and pneumonia shots, erythema and very warm to touch. Calling for assistance to BR.Alfredo Martinez A

## 2020-02-02 NOTE — Progress Notes (Signed)
Occupational Therapy Session Note  Patient Details  Name: Cody Baird MRN: [id number] Date of Birth: February 13, 1961  Today's Date: 02/02/2020 OT Individual Time: 9242-6834 OT Individual Time Calculation (min): 88 min   Short Term Goals: Week 1:  OT Short Term Goal 1 (Week 1): STGs=LTGs due to ELOS  Skilled Therapeutic Interventions/Progress Updates:    Pt greeted in bed, reporting having multiple BMs and feeling generally unwell since receiving his flu + PNA shots. Encouraged pt to shower today, with pt verbalizing having "anxiety" over showering, possibly because he was having his CVA in the shower at home. Pt stated the "small space" still just made him feel overwhelmed and claustrophobic. We discussed taking a shower in the large tub shower room, as this would be more of an open space and more similar to his showering environment at home. With encouragement, pt agreeable. He gathered needed items in room without device and supervision assist while ambulating. OT escorted pt down to the tub shower room and then he engaged in bathing at shower level + dressing tasks sit<stand from supportive chair at supervision level. Pt throughout session very talkative, he stated this made him feel less nervous. We discussed having a family member or friend present during the first few showers at home until his feelings of anxiety lessened. Pt receptive to this. OT also recommended purchase of nonslip shower treads to increase his safety. He will need a TTB for home. Pt was then escorted back to his room. He ambulated through the dayroom and then into the large gym area without device and supervision. Worked on [facility] via tying knots and fastening/unfastening large and small buttons. Pt required increased times and vcs to decrease compensatory strategies with Rt but able to meet task demands unassisted. Discussed d/c this week with pt reporting feeling "nervous" and "anxious" at the thought of going home, afraid he will  restroke. Provided encouragement and education regarding CVA prevention strategies. Afterwards he returned to the room, left him with all needs within reach, reporting he would call for staff for OOB transfer needs.   Therapy Documentation Precautions:  Precautions Precautions: Fall Precaution Comments: Lt hemi, Lt inattention Restrictions Weight Bearing Restrictions: No Vital Signs: Therapy Vitals Temp: 98.1 F (36.7 C) Temp Source: Oral Pulse Rate: 69 Resp: 18 BP: 136/86 Patient Position (if appropriate): Sitting Oxygen Therapy SpO2: 97 % O2 Device: Room Air Pain: No c/o pain during tx   ADL: ADL Eating: Not assessed Grooming: Contact guard Where Assessed-Grooming: Standing at sink Upper Body Bathing: Supervision/safety Where Assessed-Upper Body Bathing: Edge of bed Lower Body Bathing: Contact guard Where Assessed-Lower Body Bathing: Edge of bed Upper Body Dressing: Setup Where Assessed-Upper Body Dressing: Edge of bed Lower Body Dressing: Minimal assistance Where Assessed-Lower Body Dressing: Edge of bed Toileting: Contact guard Where Assessed-Toileting: Teacher, adult education: Curator Method: (without RW) Tub/Shower Equipment: Insurance underwriter: Administrator, arts Method: Ambulating(RW)     Therapy/Group: Individual Therapy  Kayzen Kendzierski A Khoa Opdahl 02/02/2020, 4:22 PM

## 2020-02-02 NOTE — Progress Notes (Signed)
Speech Language Pathology Daily Session Note  Patient Details  Name: Cody Baird MRN: [id number] Date of Birth: 1961-07-08  Today's Date: 02/02/2020 SLP Individual Time: 0905-0950 SLP Individual Time Calculation (min): 45 min  Short Term Goals: Week 1: SLP Short Term Goal 1 (Week 1): STG=LTG due to estimted short length of stay  Skilled Therapeutic Interventions: Patient received skilled SLP services targeting cognitive goals. Patient read a medication label and responded to verbal problem solving scenarios regarding the medication label with 90% accuracy requiring verbal cue x1. Patient reported increased frustration with his memory stating "it's all foggy". Patient was provided a handout and education regarding external compensatory memory strategies. Patient identified 3 external compensatory memory strategies he can utilize in the home environment with min verbal cues. SLP facilitated use of list making as a memory strategy with patient creating a grocery list of 15 items he needs at the store with supervision for completion. At the end of therapy session patient was upright in bed, bed alarm activated, and all needs within reach.  Pain Pain Assessment Pain Scale: 0-10 Pain Score: 0-No pain  Therapy/Group: Individual Therapy  Arnette Schaumann 02/02/2020, 10:34 AM

## 2020-02-03 ENCOUNTER — Inpatient Hospital Stay (HOSPITAL_COMMUNITY): Payer: Self-pay | Admitting: Physical Therapy

## 2020-02-03 ENCOUNTER — Inpatient Hospital Stay (HOSPITAL_COMMUNITY): Payer: Self-pay | Admitting: Occupational Therapy

## 2020-02-03 ENCOUNTER — Inpatient Hospital Stay (HOSPITAL_COMMUNITY): Payer: Self-pay | Admitting: Speech Pathology

## 2020-02-03 LAB — CBC
HCT: 49.9 % (ref 39.0–52.0)
Hemoglobin: 16.3 g/dL (ref 13.0–17.0)
MCH: 30.4 pg (ref 26.0–34.0)
MCHC: 32.7 g/dL (ref 30.0–36.0)
MCV: 93.1 fL (ref 80.0–100.0)
Platelets: 196 10*3/uL (ref 150–400)
RBC: 5.36 MIL/uL (ref 4.22–5.81)
RDW: 12.9 % (ref 11.5–15.5)
WBC: 7.7 10*3/uL (ref 4.0–10.5)
nRBC: 0 % (ref 0.0–0.2)

## 2020-02-03 LAB — BASIC METABOLIC PANEL
Anion gap: 10 (ref 5–15)
BUN: 12 mg/dL (ref 6–20)
CO2: 26 mmol/L (ref 22–32)
Calcium: 9.1 mg/dL (ref 8.9–10.3)
Chloride: 102 mmol/L (ref 98–111)
Creatinine, Ser: 0.93 mg/dL (ref 0.61–1.24)
GFR calc Af Amer: >60 mL/min (ref >60)
GFR calc non Af Amer: >60 mL/min (ref >60)
Glucose, Bld: 102 mg/dL — ABNORMAL HIGH (ref 70–99)
Potassium: 3.6 mmol/L (ref 3.5–5.1)
Sodium: 138 mmol/L (ref 135–145)

## 2020-02-03 NOTE — Progress Notes (Signed)
Occupational Therapy Session Note  Patient Details  Name: Cody Baird MRN: [id number] Date of Birth: [birth year]  Today's Date: 02/03/2020 OT Individual Time: 1100-1128 OT Individual Time Calculation (min): 28 min   Short Term Goals: Week 1:  OT Short Term Goal 1 (Week 1): STGs=LTGs due to ELOS  Skilled Therapeutic Interventions/Progress Updates:    Pt greeted in bed with no c/o pain. Started session by discussing IADL responsibilities at home. Pt verbalized that he will have assist from dtr and friend for the first 2 days post d/c, but then he will mostly be on his own. He ambulated without device to the therapy apartment and we practiced swiffering the floor and also simulated meal prep. We discussed placing a chair in the kitchen for energy conservation and also changing location of certain items to increase ease of access and overall safety. Pt reports his dtr can supervise/assist with meal prep on his 1st day back and that the friend can help him reorganize the kitchen to maximize safety on day 2. Pt very receptive to education and open collaboration regarding d/c. All functional kitchen mobility completed without AD and supervision assist, including transporting items from counter<table<couch and stooping to drawer below oven to retrieve/put away baking sheet. He reports he will have a small table by his couch when he wants to eat in the living room. Min vcs for increasing functional use of Lt throughout for NMR. Pt then ambulated back to his room in manner as written above. Left him with all needs within reach.    Therapy Documentation Precautions:  Precautions Precautions: Fall Precaution Comments: Lt hemi, Lt inattention Restrictions Weight Bearing Restrictions: No Pain: Pain Assessment Pain Scale: Faces Faces Pain Scale: No hurt ADL: ADL Eating: Not assessed Grooming: Contact guard Where Assessed-Grooming: Standing at sink Upper Body Bathing: Supervision/safety Where  Assessed-Upper Body Bathing: Edge of bed Lower Body Bathing: Contact guard Where Assessed-Lower Body Bathing: Edge of bed Upper Body Dressing: Setup Where Assessed-Upper Body Dressing: Edge of bed Lower Body Dressing: Minimal assistance Where Assessed-Lower Body Dressing: Edge of bed Toileting: Contact guard Where Assessed-Toileting: Teacher, adult education: Curator Method: (without RW) Tub/Shower Equipment: Insurance underwriter: Administrator, arts Method: Ambulating(RW)      Therapy/Group: Individual Therapy  Jahzaria Vary A Bram Hottel 02/03/2020, 12:48 PM

## 2020-02-03 NOTE — Care Management (Addendum)
[facility]patient Rehabilitation Center Individual Statement of Services  Patient Name:  Cody Baird  Date:  02/03/2020  Welcome to the Inpatient Rehabilitation Center.  Our goal is to provide you with an individualized program based on your diagnosis and situation, designed to meet your specific needs.  With this comprehensive rehabilitation program, you will be expected to participate in at least 3 hours of rehabilitation therapies Monday-Friday, with modified therapy programming on the weekends.  Your rehabilitation program will include the following services:  Physical Therapy (PT), Occupational Therapy (OT), Speech Therapy (ST), 24 hour per day rehabilitation nursing, Therapeutic Recreaction (TR), Psychology, Neuropsychology, Case Management (Social Worker), Rehabilitation Medicine, Nutrition Services and Pharmacy Services  Weekly team conferences will be held on Wednesdays to discuss your progress.  Your Social Automotive engineer will talk with you frequently to get your input and to update you on team discussions.  Team conferences with you and your family in attendance may also be held.  Expected length of stay: 7  days Overall anticipated outcome: Modified independent  Depending on your progress and recovery, your program may change. Your Social Automotive engineer will coordinate services and will keep you informed of any changes. Your Social Worker's/Care Manager's name and contact numbers are listed  below.  The following services may also be recommended but are not provided by the Inpatient Rehabilitation Center:    Home Health Rehabiltiation Services  Outpatient Rehabilitation Services   Arrangements will be made to provide these services after discharge if needed.  Arrangements include referral to agencies that provide these services.  Your insurance has been verified to be:  Uninsured Your primary doctor is:  No PCP  Pertinent information will be shared with your doctor and your  insurance company.  Care Manager/Social Worker:  Chana Bode, RN [phone] or ([phone]  Information discussed with and copy given to patient by: Cody Baird, 02/03/2020, 4:33 PM

## 2020-02-03 NOTE — Progress Notes (Signed)
Speech Language Pathology Daily Session Note  Patient Details  Name: Cody Baird MRN: [id number] Date of Birth: 05/12/1961  Today's Date: 02/03/2020 SLP Individual Time: 2336-1224 SLP Individual Time Calculation (min): 41 min  Short Term Goals: Week 1: SLP Short Term Goal 1 (Week 1): STG=LTG due to estimted short length of stay  Skilled Therapeutic Interventions: Pt was seen for skilled ST targeting cognitive goals. SLP facilitated session with a functional complex medication management task targeting problem solving and recall. Pt recalled 50% functions current medications when provided name of current medications. He then used list to organize BID pill box according to his list of medications Mod I, self-detecting and correcting 1 error he made when talking. He selectively attended to tasks and maintained topic of conversation with Supervision A verbal cues for redirection. SLP also taught pt how to use recurrent alarm on phone to use as compensatory memory strategy to implement at home to remember when to take medications. During contextualize practice of using time, pt required Min A for problem solving. Pt left laying in bed with alarm set and needs within reach. Continue per current plan of care.       Pain Pain Assessment Pain Scale: Faces Faces Pain Scale: No hurt  Therapy/Group: Individual Therapy  Little Ishikawa 02/03/2020, 7:14 AM

## 2020-02-03 NOTE — Progress Notes (Signed)
Physical Therapy Session Note  Patient Details  Name: Cody Baird MRN: [id number] Date of Birth: 1961-10-24  Today's Date: 02/03/2020 PT Individual Time: 0915-1015 PT Individual Time Calculation (min): 60 min   Short Term Goals: Week 1:  PT Short Term Goal 1 (Week 1): STG=LTG due to ELOS  Skilled Therapeutic Interventions/Progress Updates: Pt presented in bed agreeable to therapy. Pt states feeling very fatigued but contributes it to recent flu shot. Performed bed mobility with supervision and use of bed features. Pt ambulated to rehab gym with RW CGA near close S. Pt participated in standing balance activities including toe taps no AD 2 x 10. Pt also participated in dynamic balance and forced use of LUE placing green and blue clothespins on basketball net. PT was able to perform with contralateral and ipsilateral LE on 6in step without LOB and able to demonstrate good safety reaching outside BOS and crossing midline. Pt also performed same activity while standing on Airex. Pt participated in agility ladder forward and side stepping demonstrating fair balance and good safety with activity. Pt ambulated approx 363f around unit without AD and CGA fading to close supervision with good safety negotiating obstacles. Pt ambulated back to room at end of session and left sitting EOB with bed alarm on, call bell within reach and needs met.      Therapy Documentation Precautions:  Precautions Precautions: Fall Precaution Comments: Lt hemi, Lt inattention Restrictions Weight Bearing Restrictions: No General:   Vital Signs:  Pain:   Mobility:   Locomotion :    Trunk/Postural Assessment :    Balance:   Exercises:   Other Treatments:      Therapy/Group: Individual Therapy  Ashlay Altieri 02/03/2020, 4:38 PM

## 2020-02-03 NOTE — Progress Notes (Signed)
Occupational Therapy Session Note  Patient Details  Name: Cody Baird MRN: [id number] Date of Birth: [birth year]  Today's Date: 02/03/2020 OT Individual Time: 1400-1445 OT Individual Time Calculation (min): 45 min    Short Term Goals: Week 1:  OT Short Term Goal 1 (Week 1): STGs=LTGs due to ELOS  Skilled Therapeutic Interventions/Progress Updates:    Upon entering the room, pt supine in bed with no c/o pain and agreeable to OT intervention. Pt requesting to use bathroom and ambulated without use of AD into bathroom for toileting needs at overall supervision level. Pt ambulating 200' in same manner to gym with close supervision. Pt standing at dynavision for 3 minutes x 2 for hand eye coordination task. Pt able to hit 107 targets on first attempt and challenge increased and pt standing on foam airex and able to maintain balance and hitting 110 targets. Pt taking seated rest break secondary to fatigue. Pt standing at table for card task with focus on using L UE to shuffle, deal cards, and palmar translation task. Pt returning back to room in same manner as above. Call bell and all needed items within reach upon exiting the room.   Therapy Documentation Precautions:  Precautions Precautions: Fall Precaution Comments: Lt hemi, Lt inattention Restrictions Weight Bearing Restrictions: No   Pain: Pain Assessment Pain Scale: Faces Faces Pain Scale: No hurt ADL: ADL Eating: Not assessed Grooming: Contact guard Where Assessed-Grooming: Standing at sink Upper Body Bathing: Supervision/safety Where Assessed-Upper Body Bathing: Edge of bed Lower Body Bathing: Contact guard Where Assessed-Lower Body Bathing: Edge of bed Upper Body Dressing: Setup Where Assessed-Upper Body Dressing: Edge of bed Lower Body Dressing: Minimal assistance Where Assessed-Lower Body Dressing: Edge of bed Toileting: Contact guard Where Assessed-Toileting: Teacher, adult education: Company secretary Method: (without RW) Tub/Shower Equipment: Insurance underwriter: Administrator, arts Method: Ambulating(RW)   Therapy/Group: Individual Therapy  Alen Bleacher 02/03/2020, 4:23 PM

## 2020-02-03 NOTE — Progress Notes (Signed)
[facility] PHYSICAL MEDICINE & REHABILITATION PROGRESS NOTE   Subjective/Complaints: Diarrhea resolved with imodium.  Sleeping well at night Has some left shoulder pain, improving. Labs stable this morning.   ROS- neg CP, SOB, N/V/D poor sleep   Objective:   No results found. Recent Labs    02/03/20 0559  WBC 7.7  HGB 16.3  HCT 49.9  PLT 196   Recent Labs    02/03/20 0559  NA 138  K 3.6  CL 102  CO2 26  GLUCOSE 102*  BUN 12  CREATININE 0.93  CALCIUM 9.1    Intake/Output Summary (Last 24 hours) at 02/03/2020 1237 Last data filed at 02/03/2020 0710 Gross per 24 hour  Intake 702 ml  Output --  Net 702 ml     Physical Exam: Vital Signs Blood pressure 117/69, pulse 61, temperature (!) 97.5 F (36.4 C), temperature source Oral, resp. rate 18, height 6\' 1"  (1.854 m), weight 104.9 kg, SpO2 99 %.  General: No acute distress, sitting up in bed working with SLP Mood and affect are appropriate Heart: Regular rate and rhythm no rubs murmurs or extra sounds Lungs: Clear to auscultation, breathing unlabored, no rales or wheezes Abdomen: Positive bowel sounds, soft nontender to palpation, nondistended Extremities: No clubbing, cyanosis, or edema Skin: No evidence of breakdown, no evidence of rash Neurologic:  motor strength is 5/5 in Right 4/5 Left  bilateral deltoid, bicep, tricep, grip, hip flexor, knee extensors, ankle dorsiflexor and plantar flexor Musculoskeletal: Full range of motion in all 4 extremities. No joint swelling  Assessment/Plan: 1. 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  Physiatrist is providing close team supervision and 24 hour management of active medical problems listed below.  Physiatrist and rehab team continue to assess barriers to discharge/monitor patient progress toward functional and medical goals  Care Tool:  Bathing     Body parts bathed by patient: Right arm, Left arm, Chest, Abdomen, Buttocks, Front perineal area, Right upper leg, Left upper leg, Right lower leg, Left lower leg, Face         Bathing assist Assist Level: Supervision/Verbal cueing     Upper Body Dressing/Undressing Upper body dressing   What is the patient wearing?: Pull over shirt    Upper body assist Assist Level: Independent    Lower Body Dressing/Undressing Lower body dressing      What is the patient wearing?: Pants, Underwear/pull up     Lower body assist Assist for lower body dressing: Supervision/Verbal cueing     Toileting Toileting    Toileting assist Assist for toileting: Contact Guard/Touching assist Assistive Device Comment: urinal   Transfers Chair/bed transfer  Transfers assist     Chair/bed transfer assist level: Supervision/Verbal cueing     Locomotion Ambulation   Ambulation assist      Assist level: Supervision/Verbal cueing Assistive device: No Device Max distance: 200   Walk 10 feet activity   Assist     Assist level: Supervision/Verbal cueing Assistive device: No Device   Walk 50 feet activity   Assist    Assist level: Supervision/Verbal cueing Assistive device: No Device    Walk 150 feet activity   Assist Walk 150 feet activity did not occur: Safety/medical concerns  Assist level: Supervision/Verbal cueing Assistive device: No Device    Walk 10 feet on uneven surface  activity   Assist     Assist level: Supervision/Verbal cueing  Wheelchair     Assist Will patient use wheelchair at discharge?: No   Wheelchair activity did not occur: N/A         Wheelchair 50 feet with 2 turns activity    Assist    Wheelchair 50 feet with 2 turns activity did not occur: N/A       Wheelchair 150 feet activity     Assist  Wheelchair 150 feet activity did not occur: N/A       Blood pressure 117/69, pulse 61, temperature (!) 97.5 F (36.4  C), temperature source Oral, resp. rate 18, height 6\' 1"  (1.854 m), weight 104.9 kg, SpO2 99 %.  Medical Problem List and Plan: 1.  Impaired function secondary to R MCA CVA             -patient may  shower             -ELOS/Goals: 10-14 days; has to be Mod I- going home alone; daughter has 3 children and cannot help much  Continue CIR PT OT  2.  Antithrombotics: -DVT/anticoagulation:  Pharmaceutical: Lovenox             -antiplatelet therapy: DAPT X 3 months followed by ASA alone.  3. Pain Management: Has some left shoulder pain, but improving.  4. Mood: LCSW to follow for evaluation and support.              -antipsychotic agents: N/A 5. Neuropsych: This patient is capable of making decisions on his own behalf. 6. Skin/Wound Care: Routine pressure relief measures.  7. Fluids/Electrolytes/Nutrition:  Monitor I/O. Check lytes in am. 8. PVD s/p FPBG/multiple toe amp: Smokes 1-2 PPD. He has agreed to stop smoking/quit tobacco use. 9. Polycythemia: has been in 18,000 range since 2014 per records. Likely due to tobacco use. Continue to monitor.  10. HTN: Monitor BP tid--permissive HTN for a couple more days.  Vitals:   02/02/20 2007 02/03/20 0346  BP: 133/85 117/69  Pulse: 72 61  Resp: 16 18  Temp: 97.7 F (36.5 C) (!) 97.5 F (36.4 C)  SpO2: 95% 99%  Will monitor before starting any antihypertensive medications, would consider ACE inhibitor.  Well controlled.  11. B12 deficiency: IM supplement on 03/17 and now on oral supplement.  12. Intermittent bradycardia: Reported to have HR in 30's on telemetry --recs for 30 day outpatient cardiac monitor.  13. Leucocytosis: Monitor for signs of infection. Recheck CBC in am.  14. L inattention- will see if possible to set room up to help #15.  Hypokalemia, mild we will give one-time dose of KCl and recheck tomorrow  322: stable.            LOS: 4 days A FACE TO FACE EVALUATION WAS PERFORMED  4/17 02/03/2020, 12:37 PM

## 2020-02-04 ENCOUNTER — Inpatient Hospital Stay (HOSPITAL_COMMUNITY): Payer: Self-pay | Admitting: Speech Pathology

## 2020-02-04 ENCOUNTER — Inpatient Hospital Stay (HOSPITAL_COMMUNITY): Payer: Self-pay | Admitting: Physical Therapy

## 2020-02-04 ENCOUNTER — Encounter (HOSPITAL_COMMUNITY): Payer: Self-pay | Admitting: Psychology

## 2020-02-04 ENCOUNTER — Inpatient Hospital Stay (HOSPITAL_COMMUNITY): Payer: Self-pay | Admitting: Occupational Therapy

## 2020-02-04 DIAGNOSIS — F482 Pseudobulbar affect: Secondary | ICD-10-CM

## 2020-02-04 NOTE — Progress Notes (Signed)
[facility] PHYSICAL MEDICINE & REHABILITATION PROGRESS NOTE   Subjective/Complaints:  Some neck pain hx of pain shooting down R arm with residual first dorsal webspace numbness since that time  ROS- neg CP, SOB, N/V/D poor sleep   Objective:   No results found. Recent Labs    02/03/20 0559  WBC 7.7  HGB 16.3  HCT 49.9  PLT 196   Recent Labs    02/03/20 0559  NA 138  K 3.6  CL 102  CO2 26  GLUCOSE 102*  BUN 12  CREATININE 0.93  CALCIUM 9.1    Intake/Output Summary (Last 24 hours) at 02/04/2020 0805 Last data filed at 02/04/2020 0500 Gross per 24 hour  Intake 804 ml  Output 250 ml  Net 554 ml     Physical Exam: Vital Signs Blood pressure 119/70, pulse 73, temperature 97.7 F (36.5 C), temperature source Oral, resp. rate 18, height 6\' 1"  (1.854 m), weight 104.9 kg, SpO2 98 %.   General: No acute distress Mood and affect are appropriate Heart: Regular rate and rhythm no rubs murmurs or extra sounds Lungs: Clear to auscultation, breathing unlabored, no rales or wheezes Abdomen: Positive bowel sounds, soft nontender to palpation, nondistended Extremities: No clubbing, cyanosis, or edema Skin: No evidence of breakdown, no evidence of rash Neurologic: Cranial nerves II through XII intact, motor strength is 4/5 in left 5/5 Right  deltoid, bicep, tricep, grip, hip flexor, knee extensors, ankle dorsiflexor and plantar flexor  Musculoskeletal: Full range of motion in all 4 extremities. No joint swelling  Assessment/Plan: 1. 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  Physiatrist is providing close team supervision and 24 hour management of active medical problems listed below.  Physiatrist and rehab team continue to assess barriers to discharge/monitor patient progress toward functional and medical goals  Care Tool:  Bathing    Body parts bathed by  patient: Right arm, Left arm, Chest, Abdomen, Buttocks, Front perineal area, Right upper leg, Left upper leg, Right lower leg, Left lower leg, Face         Bathing assist Assist Level: Supervision/Verbal cueing     Upper Body Dressing/Undressing Upper body dressing   What is the patient wearing?: Pull over shirt    Upper body assist Assist Level: Independent    Lower Body Dressing/Undressing Lower body dressing      What is the patient wearing?: Pants, Underwear/pull up     Lower body assist Assist for lower body dressing: Supervision/Verbal cueing     Toileting Toileting    Toileting assist Assist for toileting: Supervision/Verbal cueing Assistive Device Comment: urinal   Transfers Chair/bed transfer  Transfers assist     Chair/bed transfer assist level: Supervision/Verbal cueing     Locomotion Ambulation   Ambulation assist      Assist level: Supervision/Verbal cueing Assistive device: No Device Max distance: 200   Walk 10 feet activity   Assist     Assist level: Supervision/Verbal cueing Assistive device: No Device   Walk 50 feet activity   Assist    Assist level: Supervision/Verbal cueing Assistive device: No Device    Walk 150 feet activity   Assist Walk 150 feet activity did not occur: Safety/medical concerns  Assist level: Supervision/Verbal cueing Assistive device: No Device    Walk 10 feet on uneven surface  activity   Assist     Assist level: Supervision/Verbal cueing  Wheelchair     Assist Will patient use wheelchair at discharge?: No   Wheelchair activity did not occur: N/A         Wheelchair 50 feet with 2 turns activity    Assist    Wheelchair 50 feet with 2 turns activity did not occur: N/A       Wheelchair 150 feet activity     Assist  Wheelchair 150 feet activity did not occur: N/A       Blood pressure 119/70, pulse 73, temperature 97.7 F (36.5 C), temperature source Oral,  resp. rate 18, height 6\' 1"  (1.854 m), weight 104.9 kg, SpO2 98 %.  Medical Problem List and Plan: 1.  Impaired function secondary to R MCA CVA             -patient may  shower             -ELOS/Goals: 10-14 days; has to be Mod I- going home alone; daughter has 3 children and cannot help much- team conf in am   Continue CIR PT OT  2.  Antithrombotics: -DVT/anticoagulation:  Pharmaceutical: Lovenox             -antiplatelet therapy: DAPT X 3 months followed by ASA alone.  3. Pain Management: Has some left shoulder pain, but improving.  4. Mood: LCSW to follow for evaluation and support.              -antipsychotic agents: N/A 5. Neuropsych: This patient is capable of making decisions on his own behalf. 6. Skin/Wound Care: Routine pressure relief measures.  7. Fluids/Electrolytes/Nutrition:  Monitor I/O. Check lytes in am. 8. PVD s/p FPBG/multiple toe amp: Smokes 1-2 PPD. He has agreed to stop smoking/quit tobacco use. 9. Polycythemia: has been in 18,000 range since 2014 per records. Likely due to tobacco use. Continue to monitor.  10. HTN: Monitor BP tid--permissive HTN for a couple more days.  Vitals:   02/03/20 2300 02/04/20 0516  BP:  119/70  Pulse:  73  Resp:  18  Temp:  97.7 F (36.5 C)  SpO2: 96% 98%  Will monitor before starting any antihypertensive medications, would consider ACE inhibitor.  Well controlled.  11. B12 deficiency: IM supplement on 03/17 and now on oral supplement.  12. Intermittent bradycardia: Reported to have HR in 30's on telemetry --recs for 30 day outpatient cardiac monitor.  13. Leucocytosis: Monitor for signs of infection. Recheck CBC in am.  14. L inattention- will see if possible to set room up to help #15.  Hypokalemia, resolved recheck prior to d/c off supplement          LOS: 5 days A FACE TO FACE EVALUATION WAS PERFORMED  Charlett Blake 02/04/2020, 8:05 AM

## 2020-02-04 NOTE — Consult Note (Signed)
Neuropsychological Consultation   Patient:   DAVIDSON PALMIERI   DOB:   1961/10/24  MR Number:  [id number]  Location:  [facility] [id number] [place] [address] Dept: [facility]: [phone]           Date of Service:   02/04/2020  Start Time:   1 PM End Time:   2 PM  Provider/Observer:  Ilean Skill, Psy.D.       Clinical Neuropsychologist       Billing Code/Service: [phone]  Chief Complaint:    RAMAJ FRANGOS is a 59 year old male with a history of PAD/PVD, ongoing tobacco use, chronic back pain.  The patient was admitted on 01/27/2020 with progressive left-sided weakness and slurred speech.  CTA head/neck showed moderate to moderately advanced stenosis distal M1 right MCA and high-grade focal stenosis A2-A4 left ACA branch as well as pulmonary emphysema.  MRI brain showed multifocal acute/subacute infarcts within the right MCA territory.  Neurology felt stroke was embolic in nature of uncertain etiology.  The patient reports that he has not been having much physical activity due to left foot abnormalities and prior surgery/injury.  Reason for Service:  The patient was referred for neuropsychological consultation due to coping and adjustment issues and mood disturbance.  Below is the HPI for the current admission.  HPI: DONNELLE OLMEDA is a 59 year old male with history of PAD/PVD, ongoing tobacco use 2 PPD, chronic back pain who was admitted on 01/27/20 with progressive left sided weakness and slurred speech. CTA head/neck showed moderate to moderately advanced stenosis distal M1 right MCA and high grade focal stenosis A3-A4 left ACA branch as well as pulmonary emphysema. MRI brain showed multifocal acute/subacute infarcts within anterior right MCA territory. 2D echo showed EF 60-65% without wall abnormality. Workup also revealed pesistent polycythemia and low vitamin B12 levels. TEE was negative  for PFO but showed small insignificant pulmonary shunt. BLE dopplers negative for DVT. He has had asymptomatic bradycardia with 3.5 second pause and stroke felt to be embolic due to uncertain etiology. 30 A cardiac monitor recommended as outpatient to rule out A fib. Dr. Erlinda Hong recommends DAPT x 3 months followed by ASA alone. Therapy ongoing and patient continues to have limitations in mobility and ADLs due to left sided weakness. CIR recommended due to functional decline.    Current Status:  The patient was alert and oriented today and in good spirits.  The patient had a very upbeat mood and described being motivated to make significant life changes to reduce his risk of further strokes and improve his overall health.  He admits to difficulties over the past several years after essentially living alone and not paying attention to his diet or exercise and becoming increasingly frustrated with his prior loss of function due to injury and problems with his left foot.  The patient had been an active golfer and other activities which had significantly reduced.  The patient describes experiencing sudden onset significant emotional responses.  He reports that he will watch something on TV triggering a sudden crying spell and that even small stressors had created strong anger responses and frustration that he would difficulty managing.  He reports that these acute/sudden changes in mood will be quite short-lived and once they are over he is returned to baseline.  The patient reports that while this is improving some it continues to occur and he was unaware of what was  going on.  The patient has anxiety responses particularly when getting into a shower as the patient had taken a shower after he started having what he now knows where his stroke symptoms.  The patient has strange sensory experiences on his left side of his body and the warm water triggered a stress response and he has fears of falling and other issues that  may be associated to his worry and fear that he experienced when he was taking a shower prior to presenting to the emergency department.  Some of the sudden mood changes could be due to strictly experiences and reactions to his sudden change in loss of function.  However, we also cannot rule out at this point acute pseudobulbar affect which could possibly be playing a role and it's association with lenticulostriate artery and white matter involvement.  The patient also has some visual neglect involvement.  Anxiety and some stress responses are noted.  Anxiety around showering is reported.  Behavioral Observation: Tallen Schnorr Bolla  presents as a 59 y.o.-year-old Right Caucasian Male who appeared his stated age. his dress was Appropriate and he was Well Groomed and his manners were Appropriate to the situation.  his participation was indicative of Appropriate and Redirectable behaviors.  There were any physical disabilities noted.  he displayed an appropriate level of cooperation and motivation.     Interactions:    Active Appropriate and Redirectable  Attention:   abnormal and attention span appeared shorter than expected for age  Memory:   within normal limits; recent and remote memory intact  Visuo-spatial:  not examined but symptoms consistent with some visual neglect.  Speech (Volume):  normal  Speech:   normal; normal  Thought Process:  Coherent and Relevant  Though Content:  WNL; not suicidal and not homicidal  Orientation:   person, place, time/date and situation  Judgment:   Fair  Planning:   Fair  Affect:    Anxious  Mood:    Anxious  Insight:   Good  Intelligence:   normal  Medical History:   Past Medical History:  Diagnosis Date  . Arthritis    low back and left hip  . B12 deficiency   . Bradycardia   . Chronic back pain   . Headache(784.0)    occasionally and sinus related   . History of bronchitis    last time about 2-62yrs ago  . PAD (peripheral artery disease)  (HCC)   . Peripheral vascular disease (HCC)   . Prolapsed, anus    hx of  . Stroke [facility]) 01/2020       Abuse/Trauma History: Patient has some acute traumatic responses around stroke event.  Psychiatric History:  No prior psychiatric history but does admit that once his relationship ended that she spent most of his free time at home "drinking and smoking cigarettes".    Family Med/Psych History:  Family History  Problem Relation Age of Onset  . Cancer Mother   . Diabetes Mother   . Diabetes Father   . Hyperlipidemia Father   . Hypertension Father   . Heart attack Father     Risk of Suicide/Violence: low patient denies any suicidal homicidal ideation.  Impression/DX:  CALYB MCQUARRIE is a 59 year old male with a history of PAD/PVD, ongoing tobacco use, chronic back pain.  The patient was admitted on 01/27/2020 with progressive left-sided weakness and slurred speech.  CTA head/neck showed moderate to moderately advanced stenosis distal M1 right MCA and high-grade focal stenosis A2-A4 left  ACA branch as well as pulmonary emphysema.  MRI brain showed multifocal acute/subacute infarcts within the right MCA territory.  Neurology felt stroke was embolic in nature of uncertain etiology.  The patient reports that he has not been having much physical activity due to left foot abnormalities and prior surgery/injury.  The patient was alert and oriented today and in good spirits.  The patient had a very upbeat mood and described being motivated to make significant life changes to reduce his risk of further strokes and improve his overall health.  He admits to difficulties over the past several years after essentially living alone and not paying attention to his diet or exercise and becoming increasingly frustrated with his prior loss of function due to injury and problems with his left foot.  The patient had been an active golfer and other activities which had significantly reduced.  The patient describes  experiencing sudden onset significant emotional responses.  He reports that he will watch something on TV triggering a sudden crying spell and that even small stressors had created strong anger responses and frustration that he would difficulty managing.  He reports that these acute/sudden changes in mood will be quite short-lived and once they are over he is returned to baseline.  The patient reports that while this is improving some it continues to occur and he was unaware of what was going on.  The patient has anxiety responses particularly when getting into a shower as the patient had taken a shower after he started having what he now knows where his stroke symptoms.  The patient has strange sensory experiences on his left side of his body and the warm water triggered a stress response and he has fears of falling and other issues that may be associated to his worry and fear that he experienced when he was taking a shower prior to presenting to the emergency department.  Some of the sudden mood changes could be due to strictly experiences and reactions to his sudden change in loss of function.  However, we also cannot rule out at this point acute pseudobulbar affect which could possibly be playing a role and it's association with lenticulostriate artery and white matter involvement.  The patient also has some visual neglect involvement.  Anxiety and some stress responses are noted.  Anxiety around showering is reported.  Disposition/Plan:  The patient is being discharged on Thursday so I do not expect to be able to see him again unless there is time on Wednesday.  The patient has a lot of anxiety around taking showers and what it will be like once he is discharged.  He does expect his sister to be there to help once he goes home.  The patient may have some pseudobulbar affect symptoms impacting his acute and sudden change in emotion and inability to inhibit emotional responses.  If these symptoms persist we may  follow-up on an outpatient basis but in most cases the symptoms resolve on their own.  Diagnosis:    R/O PBA (pseudobulbar affect)        Electronically Signed   _______________________ Arley Phenix, Psy.D.

## 2020-02-04 NOTE — Progress Notes (Signed)
Occupational Therapy Session Note  Patient Details  Name: [name] MRN: [id number] Date of Birth: [birth year]  Today's Date: 02/04/2020 OT Individual Time: 0900-1000 OT Individual Time Calculation (min): 60 min    Short Term Goals: Week 1:  OT Short Term Goal 1 (Week 1): STGs=LTGs due to ELOS  Skilled Therapeutic Interventions/Progress Updates:    Upon entering the room, pt supine in bed and agreeable to OT intervention with no c/o pain. Pt declined shower but verbalized wishing to wash up at sink this session. Pt ambulating without use of AD to dresser to obtain clean clothing items with supervision overall. Pt engaged in bathing tasks while standing for UB at sink and sit <>stand for LB bathing and clothing management with supervision. Pt ambulating in room and obtaining all dirty clothing items and ambulating to laundry room to wash items with supervision overall. Pt returning to room at end of session in same manner. Call bell and all needed items within reach.   Therapy Documentation Precautions:  Precautions Precautions: Fall Precaution Comments: Lt hemi, Lt inattention Restrictions Weight Bearing Restrictions: No Pain: Pain Assessment Pain Scale: 0-10 Pain Score: 0-No pain ADL: ADL Eating: Not assessed Grooming: Contact guard Where Assessed-Grooming: Standing at sink Upper Body Bathing: Supervision/safety Where Assessed-Upper Body Bathing: Edge of bed Lower Body Bathing: Contact guard Where Assessed-Lower Body Bathing: Edge of bed Upper Body Dressing: Setup Where Assessed-Upper Body Dressing: Edge of bed Lower Body Dressing: Minimal assistance Where Assessed-Lower Body Dressing: Edge of bed Toileting: Contact guard Where Assessed-Toileting: Teacher, adult education: Curator Method: (without RW) Tub/Shower Equipment: Insurance underwriter: Administrator, arts Method:  Ambulating(RW)   Therapy/Group: Individual Therapy  Alen Bleacher 02/04/2020, 12:33 PM

## 2020-02-04 NOTE — Progress Notes (Signed)
Speech Language Pathology Daily Session Note  Patient Details  Name: Cody Baird MRN: [id number] Date of Birth: September 23, 1961  Today's Date: 02/04/2020 SLP Individual Time: 7227-7375 SLP Individual Time Calculation (min): 44 min  Short Term Goals: Week 1: SLP Short Term Goal 1 (Week 1): STG=LTG due to estimted short length of stay  Skilled Therapeutic Interventions: Pt was seen for skilled ST targeting cognitive goals. Pt independently used schedule/notes to recall need to put his clothes in the dryer during our session, as he planned with OT in earlier session. He then ambulated around unit, locating familiar places, including the laundry area and his room, Mod I. Pt recalled 3/4 medication names and functions with Supervision A question cues. SLP also provided Mod A to use a mnemonic device to use for future recall of medication names. Pt with excellent safety awareness and problem solving throughout tasks today, and he selectively attended to tasks with Supervision A for maintain topics of conversation. Continue per current plan of care.        Pain Pain Assessment Pain Scale: 0-10 Pain Score: 0-No pain  Therapy/Group: Individual Therapy  Little Ishikawa 02/04/2020, 9:18 AM

## 2020-02-04 NOTE — Progress Notes (Signed)
Physical Therapy Session Note  Patient Details  Name: Cody Baird MRN: [id number] Date of Birth: 02/15/1961  Today's Date: 02/04/2020 PT Individual Time: 0815-0845 and 1415-1530 PT Individual Time Calculation (min): 30 min and 75 min  Short Term Goals: Week 1:  PT Short Term Goal 1 (Week 1): STG=LTG due to ELOS  Skilled Therapeutic Interventions/Progress Updates: Pt presented sitting up on bed speaking with MD agreeable to therapy. Pt states some tension in shoulders and down arms. Per pt has past history of cervical disk issues. Pt did not request any additional intervention. Performed bed mobility with supervision and ambulated to rehab gym with supervision and no AD. PTA provided pt with fall prevention HEP and [place] HEP. Pt performed all fall prevention HEP x10 with min cues for technique. Pt ambulated back to room at end of session in same manner as prior and left seated at EOB with call bell within reach and needs met.    Tx2: Pt presented in bed agreeable to therapy. Pt stating that he feels better after speaking with Neuropsych. Performed bed mobility with supervision. Pt ambulated to rehab gym no AD with supervision and participated in stair training with R rail. Pt performed with supervision with step through pattern ascending and step to pattern descending. Pt then participated in Biodex LOS x 3 trial one with BUE support and 2 without on static setting. Pt required mod multimodal cues for increased hip and ankle strategy however demonstrated increased carryover with trials and received a score of 46% on third trial. Pt also participated in dual task activities of bouncing then throwing while ambulating approx 150ft x 2 as well as maintaining meaningful appropriate conversation. Pt required minA to don gloves then cleaned high low mat from lowered position in functional task at supervision level demonstrating good safety. Pt also participated in fall recovery x 2 with CGA with 1 bout and  supervision on second bout. Pt ambulated to day room and participated in Cybex Kinetron 30cm/sec marching 15cycles x 2 for increased use of LLE. Pt questioned use of treadmill upon d/c. Transferred over to threadmill and ambulated 2 min at 0.05-0.08mph for a total of 118ft. Pt noted to have decreased B foot clearance and increased difficulty maintaining good cadence when on threadmill vs over ground. Pt ambulated back to room at end of session and left sitting EOB with bed alarm on, call bell within reach and needs met.      Therapy Documentation Precautions:  Precautions Precautions: Fall Precaution Comments: Lt hemi, Lt inattention Restrictions Weight Bearing Restrictions: No General:   Vital Signs: Therapy Vitals Temp: 97.6 F (36.4 C) Pulse Rate: 85 Resp: 19 BP: (!) 141/92 Patient Position (if appropriate): Sitting Oxygen Therapy SpO2: 98 % O2 Device: Room Air Pain: Pain Assessment Pain Scale: 0-10 Pain Score: 0-No pain Mobility:   Locomotion :    Trunk/Postural Assessment :    Balance:   Exercises:   Other Treatments:      Therapy/Group: Individual Therapy  Rosita DeChalus 02/04/2020, 4:21 PM  

## 2020-02-05 ENCOUNTER — Inpatient Hospital Stay (HOSPITAL_COMMUNITY): Payer: Self-pay | Admitting: Speech Pathology

## 2020-02-05 ENCOUNTER — Inpatient Hospital Stay (HOSPITAL_COMMUNITY): Payer: Self-pay | Admitting: Physical Therapy

## 2020-02-05 ENCOUNTER — Inpatient Hospital Stay (HOSPITAL_COMMUNITY): Payer: Self-pay | Admitting: Occupational Therapy

## 2020-02-05 ENCOUNTER — Encounter (HOSPITAL_COMMUNITY): Payer: Self-pay | Admitting: Physical Medicine & Rehabilitation

## 2020-02-05 LAB — BASIC METABOLIC PANEL
Anion gap: 10 (ref 5–15)
BUN: 13 mg/dL (ref 6–20)
CO2: 25 mmol/L (ref 22–32)
Calcium: 9.5 mg/dL (ref 8.9–10.3)
Chloride: 102 mmol/L (ref 98–111)
Creatinine, Ser: 0.99 mg/dL (ref 0.61–1.24)
GFR calc Af Amer: >60 mL/min (ref >60)
GFR calc non Af Amer: >60 mL/min (ref >60)
Glucose, Bld: 109 mg/dL — ABNORMAL HIGH (ref 70–99)
Potassium: 3.7 mmol/L (ref 3.5–5.1)
Sodium: 137 mmol/L (ref 135–145)

## 2020-02-05 NOTE — Progress Notes (Signed)
Speech Language Pathology Discharge Summary  Patient Details  Name: Cody Baird MRN: [id number] Date of Birth: 1961-02-14  Today's Date: 02/05/2020 SLP Individual Time: 1131-1200 SLP Individual Time Calculation (min): 29 min   Skilled Therapeutic Interventions:  Pt was seen for skilled ST targeting cognitive goals. SLP administered MOCA version 7.1 to assess progress toward cognitive-linguistic goals while inpatient. Pt scored 28/30 (score of 26 or above is considered [facility]). He demonstrated significant improvements in short term recall and executive function, problem solving, and selective attention skills throughout testing and rest of session. He also recalled 4/4 current medication names and functions Mod I for use of a recall strategy. In conversation regarding d/c tomorrow, he anticipated d/c equipment Mod I. Pt left sitting edge of bed with needs met. Continue per current plan of care.       Patient has met 5 of 5 long term goals.  Patient to discharge at overall Modified Independent level.  Reasons goals not met: n/a   Clinical Impression/Discharge Summary:   Pt made functional gains and met 5 out of 5 long term goals this admission. Pt is Mod I for cognitive-linguistic skills and will only require intermittent supervision at d/c. Pt has demonstrated improved selective attention, complex problem solving, error awareness and ability to self-correct, as well as use of compensatory memory strategies for short term recall. No follow up ST is indicated at this time, and pt has intermittent supervision from family and friends at d/c which will be appropriate for cognitive-linguistic standpoint based on his progress this admission. His safety awareness is also excellent. No family has been present for ST, but education has been completed with pt.   Care Partner:  Caregiver Able to Provide Assistance: Yes(intermittent help from family and friends)  Type of Caregiver Assistance:  Cognitive  Recommendation:  None      Equipment: none   Reasons for discharge: Discharged from hospital   Patient/Family Agrees with Progress Made and Goals Achieved: Yes    Arbutus Leas 02/05/2020, 7:24 AM

## 2020-02-05 NOTE — Progress Notes (Signed)
Physical Therapy Session Note  Patient Details  Name: Cody Baird MRN: [id number] Date of Birth: [birth year]  Today's Date: 02/05/2020 PT Individual Time: 0800-0900 and 1030-1100 PT Individual Time Calculation (min): 60 min and 30 min  Short Term Goals: Week 1:  PT Short Term Goal 1 (Week 1): STG=LTG due to ELOS  Skilled Therapeutic Interventions/Progress Updates: Pt presented sitting EOB agreeable to therapy. Pt denies pain during session. Nsg arrived to administer meds prior to leave room. Pt requesting to use bathroom prior to leaving, ambulated to toilet and performed toilet transfers mod I. Pt ambulated throughout unit independent level performing functional activities in preparation for d/c. Pt performed car transfers, bed mobility in ADL apt and furniture transfers at independent level. Pt ambulated to rehab gym and participated in ascending/descending stairs x 12 using R rail. Pt also participated in obstacle course weaving through cones, stepping over threshold, and gait on uneven surfaces. Pt also used rebounder x 15 forward and with L/R rotation for dynamic balance maintaining good balance. Pt ambulated to day room and participated in threadmill 0.3-0.33mh x 3 min for 1641f Pt required intermittent cues for L foot clearance. Pt also participated in NuStep L4 x 6 min for global conditioning. Discussed with pt energy conservation upon d/c while on NuStep. Pt ambulated back to room and adv designated mod I in room. PTA notified nsg and left with current needs met.    Tx2: Pt presented in bed agreeable to therapy. Pt asking questions regarding nutrition and use of mobil meal, attempted to answer questions to best of PTA's ability. Pt then ambulated to day room and participated in [place] worksheet. Pt was able to perform all activities with minimal cues. Pt also participated [employer] Internationalssessment with improved score of 52/56 as noted below. Pt with most deficits regarding single leg stance  activities. Pt returned to room at end of session and left with all needs met.      Therapy Documentation Precautions:  Precautions Precautions: Fall Precaution Comments: Lt hemi, Lt inattention Restrictions Weight Bearing Restrictions: No General:   Vital Signs:  Pain: Pain Assessment Pain Scale: 0-10 Pain Score: 0-No pain  Balance: Berg Balance Test Sit to Stand: Able to stand without using hands and stabilize independently Standing Unsupported: Able to stand safely 2 minutes Sitting with Back Unsupported but Feet Supported on Floor or Stool: Able to sit safely and securely 2 minutes Stand to Sit: Sits safely with minimal use of hands Transfers: Able to transfer safely, minor use of hands Standing Unsupported with Eyes Closed: Able to stand 10 seconds safely Standing Ubsupported with Feet Together: Able to place feet together independently and stand 1 minute safely From Standing, Reach Forward with Outstretched Arm: Can reach confidently >25 cm (10") From Standing Position, Pick up Object from Floor: Able to pick up shoe safely and easily From Standing Position, Turn to Look Behind Over each Shoulder: Looks behind from both sides and weight shifts well Turn 360 Degrees: Able to turn 360 degrees safely in 4 seconds or less Standing Unsupported, Alternately Place Feet on Step/Stool: Able to stand independently and complete 8 steps >20 seconds Standing Unsupported, One Foot in Front: Able to plae foot ahead of the other independently and hold 30 seconds Standing on One Leg: Able to lift leg independently and hold equal to or more than 3 seconds Total Score: 52 Static Sitting Balance Static Sitting - Level of Assistance: 7: Independent Dynamic Sitting Balance Dynamic Sitting - Level of Assistance:  7: Independent Static Standing Balance Static Standing - Level of Assistance: 7: Independent Dynamic Standing Balance Dynamic Standing - Balance Support: No upper extremity  supported;During functional activity Dynamic Standing - Level of Assistance: 7: Independent Dynamic Standing - Balance Activities: Forward lean/weight shifting;[place];Reaching for objects;Reaching across midline;Compliant surfaces Exercises:   Other Treatments:      Therapy/Group: Individual Therapy  Yulitza Shorts 02/05/2020, 12:27 PM

## 2020-02-05 NOTE — Progress Notes (Signed)
[facility] PHYSICAL MEDICINE & REHABILITATION PROGRESS NOTE   Subjective/Complaints:  No pain in neck or arm today  ROS- neg CP, SOB, N/V/D poor sleep   Objective:   No results found. Recent Labs    02/03/20 0559  WBC 7.7  HGB 16.3  HCT 49.9  PLT 196   Recent Labs    02/03/20 0559  NA 138  K 3.6  CL 102  CO2 26  GLUCOSE 102*  BUN 12  CREATININE 0.93  CALCIUM 9.1    Intake/Output Summary (Last 24 hours) at 02/05/2020 1012 Last data filed at 02/05/2020 0730 Gross per 24 hour  Intake 720 ml  Output --  Net 720 ml     Physical Exam: Vital Signs Blood pressure (!) 118/92, pulse 60, temperature 97.6 F (36.4 C), temperature source Oral, resp. rate 16, height 6\' 1"  (1.854 m), weight 104.9 kg, SpO2 97 %.    General: No acute distress Mood and affect are appropriate Heart: Regular rate and rhythm no rubs murmurs or extra sounds Lungs: Clear to auscultation, breathing unlabored, no rales or wheezes Abdomen: Positive bowel sounds, soft nontender to palpation, nondistended Extremities: No clubbing, cyanosis, or edema Skin: No evidence of breakdown, no evidence of rash 4/5 strength on left  5/5 on Right side UE and LE   Musculoskeletal: Full range of motion in all 4 extremities. No joint swelling  Assessment/Plan: 1. 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  Physiatrist is providing close team supervision and 24 hour management of active medical problems listed below.  Physiatrist and rehab team continue to assess barriers to discharge/monitor patient progress toward functional and medical goals  Care Tool:  Bathing    Body parts bathed by patient: Right arm, Left arm, Chest, Abdomen, Buttocks, Front perineal area, Right upper leg, Left upper leg, Right lower leg, Left lower leg, Face         Bathing assist Assist Level: Supervision/Verbal  cueing     Upper Body Dressing/Undressing Upper body dressing   What is the patient wearing?: Pull over shirt    Upper body assist Assist Level: Independent    Lower Body Dressing/Undressing Lower body dressing      What is the patient wearing?: Underwear/pull up, Pants     Lower body assist Assist for lower body dressing: Supervision/Verbal cueing     Toileting Toileting    Toileting assist Assist for toileting: Supervision/Verbal cueing Assistive Device Comment: urinal   Transfers Chair/bed transfer  Transfers assist     Chair/bed transfer assist level: Independent     Locomotion Ambulation   Ambulation assist      Assist level: Independent Assistive device: No Device Max distance: 200   Walk 10 feet activity   Assist     Assist level: Independent Assistive device: No Device   Walk 50 feet activity   Assist    Assist level: Independent Assistive device: No Device    Walk 150 feet activity   Assist Walk 150 feet activity did not occur: Safety/medical concerns  Assist level: Independent Assistive device: No Device    Walk 10 feet on uneven surface  activity   Assist     Assist level: Independent     Wheelchair     Assist Will patient use wheelchair at discharge?: No   Wheelchair activity did not occur: N/A         Wheelchair 50 feet  with 2 turns activity    Assist    Wheelchair 50 feet with 2 turns activity did not occur: N/A       Wheelchair 150 feet activity     Assist  Wheelchair 150 feet activity did not occur: N/A       Blood pressure (!) 118/92, pulse 60, temperature 97.6 F (36.4 C), temperature source Oral, resp. rate 16, height 6\' 1"  (1.854 m), weight 104.9 kg, SpO2 97 %.  Medical Problem List and Plan: 1.  Impaired function secondary to R MCA CVA             -patient may  shower             -ELOS/Goals: 10-14 days; has to be Mod I- going home alone; daughter has 3 children and cannot  help much- team conf in am   Continue CIR PT OT  2.  Antithrombotics: -DVT/anticoagulation:  Pharmaceutical: Lovenox             -antiplatelet therapy: DAPT X 3 months followed by ASA alone.  3. Pain Management: Has some left shoulder pain, but improving.  4. Mood: LCSW to follow for evaluation and support.              -antipsychotic agents: N/A 5. Neuropsych: This patient is capable of making decisions on his own behalf. 6. Skin/Wound Care: Routine pressure relief measures.  7. Fluids/Electrolytes/Nutrition:  Monitor I/O. Check lytes in am. 8. PVD s/p FPBG/multiple toe amp: Smokes 1-2 PPD. He has agreed to stop smoking/quit tobacco use. 9. Polycythemia: has been in 18,000 range since 2014 per records. Likely due to tobacco use. Continue to monitor.  10. HTN: Monitor BP tid--permissive HTN for a couple more days.  Vitals:   02/04/20 2041 02/05/20 0536  BP: (!) 140/92 (!) 118/92  Pulse: 63 60  Resp: 18 16  Temp: 98.8 F (37.1 C) 97.6 F (36.4 C)  SpO2: 96% 97%    Well controlled.  11. B12 deficiency: IM supplement on 03/17 and now on oral supplement.  12. Intermittent bradycardia: Reported to have HR in 30's on telemetry --recs for 30 day outpatient cardiac monitor.  13. Leucocytosis: Monitor for signs of infection. Recheck CBC in am.  14. L inattention- will see if possible to set room up to help #15.  Hypokalemia, resolved recheck prior to d/c off supplement          LOS: 6 days A FACE TO [place] E Aaralynn Shepheard 02/05/2020, 10:12 AM

## 2020-02-05 NOTE — Patient Care Conference (Signed)
Inpatient RehabilitationTeam Conference and Plan of Care Update Date: 02/05/2020   Time: 10:05 AM    Patient Name: Cody Baird      Medical Record Number: [id number]  Date of Birth: 1961-06-26 Sex: Male         Room/Bed: 4W10C/4W10C-01 Payor Info: Payor: MEDICAID POTENTIAL / Plan: MEDICAID POTENTIAL / Product Type: *No Product type* /    Admit Date/Time:  01/30/2020  2:22 PM  Primary Diagnosis:  <principal problem not specified>  Patient Active Problem List   Diagnosis Date Noted  . PBA (pseudobulbar affect)   . Acute right MCA stroke (HCC) 01/30/2020  . B12 deficiency   . Bradycardia 01/29/2020  . Tobacco abuse 01/28/2020  . Essential hypertension 01/28/2020  . TIA (transient ischemic attack) 01/28/2020  . Stroke (HCC) 01/28/2020  . CVA (cerebral vascular accident) (HCC) 01/28/2020  . Aftercare following surgery of the circulatory system, NEC 01/14/2014  . Ischemic pain of foot 12/24/2013  . Atherosclerotic peripheral vascular disease with rest pain (HCC) 10/30/2013  . Post-op pain 09/17/2013  . PVD (peripheral vascular disease) (HCC) 09/17/2013  . Post-operative pain 07/16/2013  . 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 Peripheral vascular disease, unspecified (HCC) 06/25/2013  . Pain in limb 06/25/2013    Expected Discharge Date: Expected Discharge Date: 02/06/20  Team Members Present: Physician leading conference: Dr. Claudette Laws Care Coodinator Present: Roderic Palau, RN, MSN;Deborah Cedric Fishman, RN, BSN, CRRN Nurse Present: Vincente Poli, RN PT Present: Grier Rocher, PT OT Present: Jackquline Denmark, OT SLP Present: Suzzette Righter, CF-SLP PPS Coordinator present : Edson Snowball, Park Breed, SLP     Current Status/Progress Goal Weekly Team Focus  Bowel/Bladder   Pt continent of B/B, LBM 3/22  Remain continent  Assess toileting q shift and prn   Swallow/Nutrition/ Hydration             ADL's   Supervision  ambulatory bathroom transfers without device, supervision bathing/dressing tasks sit<stand at shower level  Mod I  NMR, d/c planning, dynamic balance   Mobility   supervision bed mobility, supervision transfers, supervision gait no AD  mod I  higher level balance, endurance, d/c planning   Communication             Safety/Cognition/ Behavioral Observations  Mod I selective attention, emergent awareness, supervision-Mod I recall with strategies and complex problem solving  Mod I  Finish education, complex problem solving and recall, ready for d/c   Pain   No c/o pain  Remain pain free  Assess pain q shift and prn   Skin   Skin rash/excoriation on upper back and chest  Prevent further skin breakdown  Assess skin q shift and prn    Rehab Goals Patient on target to meet rehab goals: Yes *See Care Plan and progress notes for long and short-term goals.     Barriers to Discharge  Current Status/Progress Possible Resolutions Date Resolved   Nursing                  PT                    OT                  SLP                SW Decreased caregiver support;Home environment access/layout 2 step entry to apt Going home with daughter coming in to assist prn  Discharge Planning/Teaching Needs:  Home with daughter assisting as needed  Medications, transfers, safety, toileting, etc.   Team Discussion: MD monitoring BP, HR okay, numbness R arm chronic issue, hypokalemia, K+ repleated.  RN mod I in the room.  OT at goal level.  PT at goal level mod I, has home exercise program.  SLP mod I goal level.   Revisions to Treatment Plan: N/A     Medical Summary Current Status: Some problem with right upper extremity chronic pain as well as neck pain.  No new trauma.  Progressing well with therapy Weekly Focus/Goal: Discharge planning  Barriers to Discharge: Medication compliance;Medical stability   Possible Resolutions to Barriers: May need neuropsych follow-up for lifestyle  modification   Continued Need for Acute Rehabilitation Level of Care: The patient requires daily medical management by a physician with specialized training in physical medicine and rehabilitation for the following reasons: Direction of a multidisciplinary physical rehabilitation program to maximize functional independence : Yes Medical management of patient stability for increased activity during participation in an intensive rehabilitation regime.: Yes Analysis of laboratory values and/or radiology reports with any subsequent need for medication adjustment and/or medical intervention. : Yes   I attest that I was present, lead the team conference, and concur with the assessment and plan of the team.   Retta Diones 02/05/2020, 4:10 PM   Team conference was held via web/ teleconference due to [place] - 19

## 2020-02-05 NOTE — Progress Notes (Signed)
Occupational Therapy Discharge Summary  Patient Details  Name: Cody Baird MRN: [id number] Date of Birth: 1961-11-13  Today's Date: 02/05/2020 OT Individual Time: 2440-1027 OT Individual Time Calculation (min): 55 min    Patient has met 9 of 9 long term goals due to improved activity tolerance, improved balance, postural control, ability to compensate for deficits, functional use of  LEFT upper extremity and improved coordination.  Patient to discharge at overall Modified Independent level.    Reasons goals not met: all goals met  Recommendation:  No follow up OT intervention recommended  Equipment: TTB  Reasons for discharge: treatment goals met  Patient/family agrees with progress made and goals achieved: Yes   OT Intervention: Upon entering the room, pt seated on EOB and is currently mod I in room. Pt is agreeable to shower this session. Pt had already gathered clothing items and placed them into bathroom. Pt ambulating in room to obtain towels and washcloth at mod I level without use of AD. Pt bathing at shower level with sit <>stand from shower seat at mod I level. Pt seated for LB dressing demonstrating good safety awareness. Pt exiting the bathroom and seated on EOB to complete dressing tasks at mod I level as well. Pt picking up dirty items from floor to clean up bathroom with squat needed and no LOB this session. Pt returning to seated position on EOB and OT provided pt with paper handout for L hand [facility] tasks and reviewed with him. Pt returning demonstrations with min cuing for technique. Pt remained seated on EOB at end of session with call bell and all needed items within reach upon exiting the room.   OT Discharge Precautions/Restrictions  Precautions Precautions: Fall General   Vital Signs Therapy Vitals Temp: 97.6 F (36.4 C) Pulse Rate: 78 Resp: 20 BP: (!) 130/91 Patient Position (if appropriate): Sitting Oxygen Therapy SpO2: 97 % O2 Device: Room  Air Pain Pain Assessment Pain Scale: 0-10 Pain Score: 0-No pain ADL ADL Eating: Not assessed Grooming: Contact guard Where Assessed-Grooming: Standing at sink Upper Body Bathing: Supervision/safety Where Assessed-Upper Body Bathing: Edge of bed Lower Body Bathing: Contact guard Where Assessed-Lower Body Bathing: Edge of bed Upper Body Dressing: Setup Where Assessed-Upper Body Dressing: Edge of bed Lower Body Dressing: Minimal assistance Where Assessed-Lower Body Dressing: Edge of bed Toileting: Contact guard Where Assessed-Toileting: Glass blower/designer: Psychiatric nurse Method: (without RW) Tub/Shower Equipment: Facilities manager: Curator Method: Ambulating(RW) Vision Baseline Vision/History: Wears glasses Wears Glasses: Reading only Patient Visual Report: No change from baseline Cognition Overall Cognitive Status: Impaired/Different from baseline Arousal/Alertness: Awake/alert Orientation Level: Oriented X4 Attention: Selective;Divided Sustained Attention: Appears intact Selective Attention: Appears intact Selective Attention Impairment: Verbal complex;Functional complex Divided Attention: Impaired Divided Attention Impairment: Functional complex Memory: Impaired Memory Impairment: Decreased short term memory Decreased Short Term Memory: Verbal complex Problem Solving: Impaired Problem Solving Impairment: Functional complex Sensation Sensation Light Touch: Appears Intact Hot/Cold: Appears Intact Proprioception: Appears Intact Stereognosis: Not tested Coordination Gross Motor Movements are Fluid and Coordinated: Yes Fine Motor Movements are Fluid and Coordinated: No Coordination and Movement Description: L impaired dexterity and fine motor coordination Motor  Motor Motor: Within Functional Limits Mobility  Bed Mobility Bed Mobility: Rolling Left;Rolling Right;Sit to Supine;Supine to  Sit Rolling Right: Independent Rolling Left: Independent Supine to Sit: Independent Sit to Supine: Independent Transfers Sit to Stand: Independent  Trunk/Postural Assessment  Cervical Assessment Cervical Assessment: Exceptions to WFL(forward head) Thoracic Assessment  Thoracic Assessment: Exceptions to WFL(rounded shoulders) Lumbar Assessment Lumbar Assessment: Within Functional Limits Postural Control Postural Control: Within Functional Limits  Balance Balance Balance Assessed: Yes Static Sitting Balance Static Sitting - Level of Assistance: 7: Independent Dynamic Sitting Balance Dynamic Sitting - Level of Assistance: 7: Independent Static Standing Balance Static Standing - Level of Assistance: 7: Independent Dynamic Standing Balance Dynamic Standing - Level of Assistance: 7: Independent Extremity/Trunk Assessment RUE Assessment RUE Assessment: Within Functional Limits Active Range of Motion (AROM) Comments: WNL LUE Assessment LUE Assessment: Within Functional Limits   Gypsy Decant 02/05/2020, 4:23 PM

## 2020-02-06 MED ORDER — NICOTINE POLACRILEX 2 MG MT GUM: 2.0000 mg | CHEWING_GUM | OROMUCOSAL | 0 refills | Status: DC | PRN

## 2020-02-06 MED ORDER — ATORVASTATIN CALCIUM 40 MG PO TABS: 40.0000 mg | ORAL_TABLET | Freq: Every day | ORAL | 0 refills | Status: DC

## 2020-02-06 MED ORDER — ASPIRIN 325 MG PO TBEC: 325.0000 mg | DELAYED_RELEASE_TABLET | Freq: Every day | ORAL | 0 refills | Status: DC

## 2020-02-06 MED ORDER — CLOPIDOGREL BISULFATE 75 MG PO TABS: 75.0000 mg | ORAL_TABLET | Freq: Every day | ORAL | 0 refills | Status: DC

## 2020-02-06 MED FILL — ASPIRIN EC 325 MG TABLET: 325 | 30 days supply | Qty: 30 | Fill #0

## 2020-02-06 MED FILL — ATORVASTATIN CALCIUM 40 MG: 40 | 30 days supply | Qty: 30 | Fill #0

## 2020-02-06 MED FILL — CLOPIDOGREL 75 MG TABLET: 75 | 30 days supply | Qty: 30 | Fill #0

## 2020-02-06 MED FILL — VITAMIN B-12 1000 MCG TABS: 1000 | 30 days supply | Qty: 30 | Fill #0

## 2020-02-06 NOTE — Care Management (Signed)
   The overall goal for the admission was met for:   Discharge location: Home alone with daughter checking in on him  Length of Stay: 7 days with discharge 02/06/20  Discharge activity level: Patient to discharge at overall Modified Independent level.    Home/community participation: Clinical research associate provided included: MD, RD, PT, OT, SLP, RN, CM, TR, Pharmacy, Neuropsych and SW  Financial Services: Other: Uninsured  Follow-up services arranged: DME: TTB and Patient/Family has no preference for HH/DME agencies  Comments (or additional information): Appointment at [facility] February 26, 2020 @ [place] discount card for medications  Patient/Family verbalized understanding of follow-up arrangements: Yes  Individual responsible for coordination of the follow-up plan: [phone] Daughter Caryl Pina [phone]  Confirmed correct DME delivered: Margarito Liner 02/06/2020    Margarito Liner

## 2020-02-06 NOTE — Progress Notes (Signed)
Team Conference Report to Patient/Family  Team Conference discussion was reviewed with the patient , including goals, any changes in plan of care and target discharge date.  Patient  expressed understanding and is in agreement.  The patient has a target discharge date of 02/06/20. TTB ordered from [employer] pending credit card payment. [facility] discount card for medications given to patient with a list of pharmacies from which to choose his preferred pharmacy. He will notify his daughter of the discharge plan.  Chana Bode B 02/06/2020, 11:25 AM

## 2020-02-06 NOTE — Discharge Summary (Signed)
Physician Discharge Summary  Patient ID: Cody Baird MRN: [id number] DOB/AGE: Mar 06, 1961 59 y.o.  Admit date: 01/30/2020 Discharge date: 02/06/2020  Discharge Diagnoses:  Principal Problem:   Acute right MCA stroke [facility]) Active Problems:   Peripheral vascular disease, unspecified (HCC)   Tobacco abuse   B12 deficiency   PBA (pseudobulbar affect)   Discharged Condition: stable   Significant Diagnostic Studies:  Labs:  Basic Metabolic Panel: Recent Labs  Lab 01/31/20 0504 02/03/20 0559 02/05/20 1028  NA 138 138 137  K 3.4* 3.6 3.7  CL 105 102 102  CO2 23 26 25   GLUCOSE 99 102* 109*  BUN 13 12 13   CREATININE 0.94 0.93 0.99  CALCIUM 8.8* 9.1 9.5    CBC: Recent Labs  Lab 01/31/20 0504 02/03/20 0559  WBC 8.1 7.7  NEUTROABS 4.8  --   HGB 15.7 16.3  HCT 46.9 49.9  MCV 91.6 93.1  PLT 190 196    CBG: No results for input(s): GLUCAP in the last 168 hours.  Brief HPI:   Cody Baird is a 59 y.o. male with history of PAD/PVD, ongoing 2 pack/day tobacco use, chronic back pain; was admitted on 01/27/2020 with progressive left-sided weakness and slurred speech.  MRI brain showed multifocal acute/subacute infarcts within anterior right MCA territory.  2D echo showed EF of 60 to 65% without wall abnormality.  TEE was negative for PFO but showed small insignificant pulmonary shunt.  BLE Dopplers were negative for DVT.  Stroke felt to be embolic due to unknown cause and 30-day cardiac monitor recommended to rule out A. fib. Vitamin B 12 supplement added due to B 12 deficiency.   Dr. 59 recommended DAPT x3 months followed by ASA alone.  Therapy ongoing and patient continued to have limitations in mobility and ADLs.  CIR was recommended for follow-up therapy   Hospital Course: Cody Baird was admitted to rehab 01/30/2020 for inpatient therapies to consist of PT and OT at least three hours five days a week. Past admission physiatrist, therapy team and rehab RN have worked together to  provide customized collaborative inpatient rehab.  He is tolerating DAPT without side effects and follow up CBC showed WBC/platelets to be stable.  His blood pressures were monitored on TID basis and have been controlled without medications.  Follow-up BMET showed renal status to be stable but transient hypokalemia which has resolved with supplementation. He is continent of bowel and bladder.  Dr. Lucianne Lei was consulted for evaluation of mood and support and felt that patient likely with pseudobulbar affect with anxiety which is resolving. He has been educated on tobacco cessation and has planned to quit. He has made gains during rehab stay and is modified independent at discharge. No follow up therapy recommended.    Rehab course: During patient's stay in rehab team conference was held to monitor patient's progress, set goals and discuss barriers to discharge. At admission, patient required min assist with ADL task and with mobility.  He exhibited mild cognitive deficits and mild to moderate impairments in short-term recall. He  has had improvement in activity tolerance, balance, postural control as well as ability to compensate for deficits. He/She has had improvement in functional use LUE  and LLE as well as improvement in awareness.  He is able to complete ADL tasks at modified independent level.  He is modified independent for transfers and is able to ambulate 200 feet without assistive device.  He has demonstrated significant improvement in short-term recall, problem solving  and selective attention with MoCA score 28 out of 30.  Patient is able to advocate for assistance as needed and no family education needed due to current level of independence.   Disposition: Home  Diet: Heart Healthy.  Special Instructions: 1. No driving or strenuous activity till cleared by MD.   Discharge Instructions    Ambulatory referral to Cardiology   Complete by: As directed    Needs to be set up for 30 day event  monitor     Allergies as of 02/06/2020      Reactions   Penicillins Other (See Comments)   Childhood allergy not sure what the reaction is  Did it involve swelling of the face/tongue/throat, SOB, or low BP? Unknown Did it involve sudden or severe rash/hives, skin peeling, or any reaction on the inside of your mouth or nose? Unknown Did you need to seek medical attention at a hospital or doctor's office? Unknown When did it last happen?childhood If all above answers are "NO", may proceed with cephalosporin use.      Medication List    STOP taking these medications    stroke: mapping our early stages of recovery book Misc   senna-docusate 8.6-50 MG tablet Commonly known as: Senokot-S     TAKE these medications   aspirin 325 MG EC tablet Take 1 tablet (325 mg total) by mouth daily.   atorvastatin 40 MG tablet Commonly known as: LIPITOR Take 1 tablet (40 mg total) by mouth daily at 6 PM.   clopidogrel 75 MG tablet Commonly known as: PLAVIX Take 1 tablet (75 mg total) by mouth daily. Take one tab daily for 90 days then stop   cyanocobalamin 1000 MCG tablet Take 1 tablet (1,000 mcg total) by mouth daily.   nicotine polacrilex 2 MG gum Commonly known as: NICORETTE Take 1 each (2 mg total) by mouth as needed for smoking cessation.      Follow-up Information    Kirsteins, Luanna Salk, MD Follow up.   Specialty: Physical Medicine and Rehabilitation Why: office will call you with follow up appointment Contact information: [place] [address] [phone]        [facility] Follow up.   Why: Office will call you with follow up appointment Contact information: [address]     [address] [place] [address] [phone]       Charlott Rakes, MD Follow up on 02/26/2020.   Specialty: Family Medicine Why: Appointment at 9:30 am Contact information: [place]  [address] [phone]        [facility] Follow up.   Specialty: Cardiology Why: Will call you with information about 30 day event monitor Contact information: [address], [place]          Signed: Bary Leriche 02/09/2020, 11:11 PM

## 2020-02-06 NOTE — Progress Notes (Signed)
Pt discharged home with family.Discharge instructions given by Elita Quick, PA. Denies pain or discomfort. Belongings sent with pt. Pt stable at time of discharge.    Marylu Lund, RN

## 2020-02-06 NOTE — Discharge Instructions (Signed)
Inpatient Rehab Discharge Instructions  Cody Baird Discharge date and time: 02/06/20   Activities/Precautions/ Functional Status: Activity: no lifting, driving, or strenuous exercise till cleared by MD Diet: cardiac diet Wound Care: none needed   Functional status:  ___ No restrictions     ___ Walk up steps independently ___ 24/7 supervision/assistance   ___ Walk up steps with assistance _X__ Intermittent supervision/assistance  _X__ Bathe/dress independently ___ Walk with walker     ___ Bathe/dress with assistance ___ Walk Independently    ___ Shower independently ___ Walk with assistance    ___ Shower with assistance _X__ No alcohol     ___ Return to work/school ________   Special Instructions:  STROKE/TIA DISCHARGE INSTRUCTIONS SMOKING Cigarette smoking nearly doubles your risk of having a stroke & is the single most alterable risk factor  If you smoke or have smoked in the last 12 months, you are advised to quit smoking for your health.  Most of the excess cardiovascular risk related to smoking disappears within a year of stopping.  Ask you doctor about anti-smoking medications  [place] Quit Line: 1-800-QUIT NOW  Free Smoking Cessation Classes [phone]  CHOLESTEROL Know your levels; limit fat & cholesterol in your diet  Lipid Panel     Component Value Date/Time   CHOL 206 (H) 01/28/2020 0612   TRIG 154 (H) 01/28/2020 0612   HDL 31 (L) 01/28/2020 0612   CHOLHDL 6.6 01/28/2020 0612   VLDL 31 01/28/2020 0612   LDLCALC 144 (H) 01/28/2020 0612      Many patients benefit from treatment even if their cholesterol is at goal.  Goal: Total Cholesterol (CHOL) less than 160  Goal:  Triglycerides (TRIG) less than 150  Goal:  HDL greater than 40  Goal:  LDL (LDLCALC) less than 100   BLOOD PRESSURE American Stroke Association blood pressure target is less that 120/80 mm/Hg  Your discharge blood pressure is:  BP: 128/83  Monitor your blood pressure  Limit your salt  and alcohol intake  Many individuals will require more than one medication for high blood pressure  DIABETES (A1c is a blood sugar average for last 3 months) Goal HGBA1c is under 7% (HBGA1c is blood sugar average for last 3 months)  Diabetes:     Lab Results  Component Value Date   HGBA1C 5.7 (H) 01/28/2020     Your HGBA1c can be lowered with medications, healthy diet, and exercise.  Check your blood sugar as directed by your physician  Call your physician if you experience unexplained or low blood sugars.  PHYSICAL ACTIVITY/REHABILITATION Goal is 30 minutes at least 4 days per week  Activity: No driving, Therapies: N/A Return to work: N/A  Activity decreases your risk of heart attack and stroke and makes your heart stronger.  It helps control your weight and blood pressure; helps you relax and can improve your mood.  Participate in a regular exercise program.  Talk with your doctor about the best form of exercise for you (dancing, walking, swimming, cycling).  DIET/WEIGHT Goal is to maintain a healthy weight  Your discharge diet is:  Diet Order            Diet Heart Room service appropriate? Yes; Fluid consistency: Thin  Diet effective now            liquids Your height is:  Height: 6\' 1"  (185.4 cm) Your current weight is: Weight: 104.9 kg Your Body Mass Index (BMI) is:  BMI (Calculated): 30.52  Following  the type of diet specifically designed for you will help prevent another stroke.  Your goal weight is: *  Your goal Body Mass Index (BMI) is 19-24.  Healthy food habits can help reduce 3 risk factors for stroke:  High cholesterol, hypertension, and excess weight.  RESOURCES Stroke/Support Group:  Call [phone]   STROKE EDUCATION PROVIDED/REVIEWED AND GIVEN TO PATIENT Stroke warning signs and symptoms How to activate emergency medical system (call 911). Medications prescribed at discharge. Need for follow-up after discharge. Personal risk factors for  stroke. Pneumonia vaccine given:  Flu vaccine given:  My questions have been answered, the writing is legible, and I understand these instructions.  I will adhere to these goals & educational materials that have been provided to me after my discharge from the hospital.     My questions have been answered and I understand these instructions. I will adhere to these goals and the provided educational materials after my discharge from the hospital.  Patient/Caregiver Signature _______________________________ Date __________  Clinician Signature _______________________________________ Date __________  Please bring this form and your medication list with you to all your follow-up doctor's appointments.

## 2020-02-06 NOTE — Plan of Care (Signed)
  Problem: Consults Goal: RH STROKE PATIENT EDUCATION Description: See Patient Education module for education specifics  Outcome: Completed/Met Goal: Nutrition Consult-if indicated Outcome: Completed/Met   Problem: RH SKIN INTEGRITY Goal: RH STG SKIN FREE OF INFECTION/BREAKDOWN Description: Patient will not have skin breakdown at discharge. Outcome: Completed/Met Goal: RH STG MAINTAIN SKIN INTEGRITY WITH ASSISTANCE Description: STG Maintain Skin Integrity With  mod I  Outcome: Completed/Met   Problem: RH SAFETY Goal: RH STG ADHERE TO SAFETY PRECAUTIONS W/ASSISTANCE/DEVICE Description: STG Adhere to Safety Precautions With  min Assistance/Device. Outcome: Completed/Met   Problem: RH PAIN MANAGEMENT Goal: RH STG PAIN MANAGED AT OR BELOW PT'S PAIN GOAL Description: Less than 3 Outcome: Completed/Met   Problem: RH KNOWLEDGE DEFICIT Goal: RH STG INCREASE KNOWLEDGE OF HYPERTENSION Description: Patient will verbalize understanding of how to manage hypertension with medications Outcome: Completed/Met Goal: RH STG INCREASE KNOWLEGDE OF HYPERLIPIDEMIA Description: Patent will verbalize understanding of how to manage hyperlipidemia with medications prior to discharge. Outcome: Completed/Met

## 2020-02-06 NOTE — Progress Notes (Signed)
[place] PHYSICAL MEDICINE & REHABILITATION PROGRESS NOTE   Subjective/Complaints:  Occasional right-sided neck pain.  Did not sleep well last night worrying about his discharge today.  We discussed driving.  He no longer drives because of right ankle pain We also discussed working but he no longer works ROS- neg CP, SOB, N/V/D poor sleep   Objective:   No results found. No results for input(s): WBC, HGB, HCT, PLT in the last 72 hours. Recent Labs    02/05/20 1028  NA 137  K 3.7  CL 102  CO2 25  GLUCOSE 109*  BUN 13  CREATININE 0.99  CALCIUM 9.5    Intake/Output Summary (Last 24 hours) at 02/06/2020 0946 Last data filed at 02/06/2020 0825 Gross per 24 hour  Intake 662 ml  Output --  Net 662 ml     Physical Exam: Vital Signs Blood pressure 128/83, pulse 62, temperature 98 F (36.7 C), temperature source Oral, resp. rate 19, height 6\' 1"  (1.854 m), weight 104.9 kg, SpO2 96 %.    General: No acute distress Mood and affect are appropriate Heart: Regular rate and rhythm no rubs murmurs or extra sounds Lungs: Clear to auscultation, breathing unlabored, no rales or wheezes Abdomen: Positive bowel sounds, soft nontender to palpation, nondistended Extremities: No clubbing, cyanosis, or edema Skin: No evidence of breakdown, no evidence of rash 4/5 strength on left  5/5 on Right side UE and LE   Musculoskeletal: Full range of motion in all 4 extremities. No joint swelling  Assessment/Plan: 1. 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  Physiatrist is providing close team supervision and 24 hour management of active medical problems listed below.  Physiatrist and rehab team continue to assess barriers to discharge/monitor patient progress toward functional and medical goals  Care Tool:  Bathing    Body parts bathed by patient: Right arm, Left arm, Chest,  Abdomen, Buttocks, Front perineal area, Right upper leg, Left upper leg, Right lower leg, Left lower leg, Face         Bathing assist Assist Level: Independent with assistive device Assistive Device Comment: shower chair   Upper Body Dressing/Undressing Upper body dressing   What is the patient wearing?: Pull over shirt    Upper body assist Assist Level: Independent    Lower Body Dressing/Undressing Lower body dressing      What is the patient wearing?: Underwear/pull up, Pants     Lower body assist Assist for lower body dressing: Independent     Toileting Toileting    Toileting assist Assist for toileting: Independent Assistive Device Comment: urinal   Transfers Chair/bed transfer  Transfers assist     Chair/bed transfer assist level: Independent     Locomotion Ambulation   Ambulation assist      Assist level: Independent Assistive device: No Device Max distance: 200   Walk 10 feet activity   Assist     Assist level: Independent Assistive device: No Device   Walk 50 feet activity   Assist    Assist level: Independent Assistive device: No Device    Walk 150 feet activity   Assist Walk 150 feet activity did not occur: Safety/medical concerns  Assist level: Independent Assistive device: No Device    Walk 10 feet on uneven surface  activity   Assist     Assist level: Independent     Wheelchair     Assist Will  patient use wheelchair at discharge?: No   Wheelchair activity did not occur: N/A         Wheelchair 50 feet with 2 turns activity    Assist    Wheelchair 50 feet with 2 turns activity did not occur: N/A       Wheelchair 150 feet activity     Assist  Wheelchair 150 feet activity did not occur: N/A       Blood pressure 128/83, pulse 62, temperature 98 F (36.7 C), temperature source Oral, resp. rate 19, height 6\' 1"  (1.854 m), weight 104.9 kg, SpO2 96 %.  Medical Problem List and Plan: 1.   Impaired function secondary to R MCA CVA             Discharged home today  Continue CIR PT OT  2.  Antithrombotics: -DVT/anticoagulation:  Pharmaceutical: Lovenox             -antiplatelet therapy: DAPT X 3 months followed by ASA alone.  3. Pain Management: Has some left shoulder pain, but improving.  4. Mood: LCSW to follow for evaluation and support.              -antipsychotic agents: N/A 5. Neuropsych: This patient is capable of making decisions on his own behalf. 6. Skin/Wound Care: Routine pressure relief measures.  7. Fluids/Electrolytes/Nutrition:  Monitor I/O. Check lytes in am. 8. PVD s/p FPBG/multiple toe amp: Smokes 1-2 PPD. He has agreed to stop smoking/quit tobacco use. 9. Polycythemia: has been in 18,000 range since 2014 per records. Likely due to tobacco use. Continue to monitor.  10. HTN: Monitor BP tid--permissive HTN for a couple more days.  Vitals:   02/05/20 1938 02/06/20 0445  BP: 137/76 128/83  Pulse: 71 62  Resp: 17 19  Temp: 97.8 F (36.6 C) 98 F (36.7 C)  SpO2: 96% 96%    Well controlled.  11. B12 deficiency: IM supplement on 03/17 and now on oral supplement.  12. Intermittent bradycardia: Reported to have HR in 30's on telemetry --recs for 30 day outpatient cardiac monitor.  13. Leucocytosis: Monitor for signs of infection. Recheck CBC in am.  14. L inattention- will see if possible to set room up to help   15.  Hypokalemia resolved potassium 3.7 on 3/24  LOS: 7 days A FACE TO FACE EVALUATION WAS PERFORMED  Charlett Blake 02/06/2020, 9:46 AM

## 2020-02-10 ENCOUNTER — Telehealth: Payer: Self-pay | Admitting: Registered Nurse

## 2020-02-10 NOTE — Telephone Encounter (Signed)
Transitional Care call  Patient name: Cody Baird  DOB: January 15, 1961 1. Are you/is patient experiencing any problems since coming home? Yes, left arm pain, he was unable to describe pain. Stated " it feels like a pinched nerve". He denises Chest pain or SOB, he was instructed to call 911 if he develops chest pain OR SOB, he verbalizes understanding. He reports he took a tylenol and pain eased off. Re-iterated if the left arm pain persists for him to go to ED for evaluation, he verbalizes understanding.  a. Are there any questions regarding any aspect of care? No 2. Are there any questions regarding medications administration/dosing? No a. Are meds being taken as prescribed? Yes b. "Patient should review meds with caller to confirm" Medication List Reviewed. 3. Have there been any falls? No 4. Has Home Health been to the house and/or have they contacted you? No Home Health Set Up/ Uninsured a. If not, have you tried to contact them? NA b. Can we help you contact them? NA c. Are bowels and bladder emptying properly? Yes Are there any unexpected incontinence issues? No d. If applicable, is patient following bowel/bladder programs? NA 5. Any fevers, problems with breathing, unexpected pain? No 6. Are there any skin problems or new areas of breakdown? No 7. Has the patient/family member arranged specialty MD follow up (ie cardiology/neurology/renal/surgical/etc.)?  He has HFU appointments except for [facility] Neurology, he was instructed to call to schedule HFU appointment, he verbalizes understanding.  a. Can we help arrange? No 8. Does the patient need any other services or support that we can help arrange? No 9. Are caregivers following through as expected in assisting the patient? Self-Care 10. Has the patient quit smoking, drinking alcohol, or using drugs as recommended? Mr. Kloepfer  States he's not smoking drinking alcohol or using illicit drugs.   Appointment date/time 02/17/2020  arrival time  2:00 for 2:15 appointment with Dr Wynn Banker. At [address] [employer] [address]

## 2020-02-17 ENCOUNTER — Encounter: Payer: Self-pay | Admitting: Physical Medicine & Rehabilitation

## 2020-02-17 ENCOUNTER — Encounter: Payer: Self-pay | Attending: Physical Medicine & Rehabilitation | Admitting: Physical Medicine & Rehabilitation

## 2020-02-17 ENCOUNTER — Other Ambulatory Visit: Payer: Self-pay

## 2020-02-17 VITALS — BP 123/80 | HR 86 | Temp 97.9°F | Ht 72.0 in | Wt 225.0 lb

## 2020-02-17 DIAGNOSIS — I83013 Varicose veins of right lower extremity with ulcer of ankle: Secondary | ICD-10-CM | POA: Insufficient documentation

## 2020-02-17 DIAGNOSIS — M25371 Other instability, right ankle: Secondary | ICD-10-CM | POA: Insufficient documentation

## 2020-02-17 DIAGNOSIS — L97319 Non-pressure chronic ulcer of right ankle with unspecified severity: Secondary | ICD-10-CM | POA: Insufficient documentation

## 2020-02-17 DIAGNOSIS — I63511 Cerebral infarction due to unspecified occlusion or stenosis of right middle cerebral artery: Secondary | ICD-10-CM | POA: Insufficient documentation

## 2020-02-17 DIAGNOSIS — M5412 Radiculopathy, cervical region: Secondary | ICD-10-CM | POA: Insufficient documentation

## 2020-02-17 DIAGNOSIS — I7025 Atherosclerosis of native arteries of other extremities with ulceration: Secondary | ICD-10-CM | POA: Insufficient documentation

## 2020-02-17 HISTORY — DX: Varicose veins of right lower extremity with ulcer of ankle: I83.013

## 2020-02-17 NOTE — Progress Notes (Signed)
Subjective:    Patient ID: Cody Baird, male    DOB: 09-Sep-1961, 59 y.o.   MRN: [id number]   Admit date: 01/30/2020 Discharge date: 02/06/2020  59 y.o. male with history of PAD/PVD, ongoing 2 pack/day tobacco use, chronic back pain; was admitted on 01/27/2020 with progressive left-sided weakness and slurred speech.  MRI brain showed multifocal acute/subacute infarcts within anterior right MCA territory.  2D echo showed EF of 60 to 65% without wall abnormality.  TEE was negative for PFO but showed small insignificant pulmonary shunt.  BLE Dopplers were negative for DVT.  Stroke felt to be embolic due to unknown cause and 30-day cardiac monitor recommended to rule out A. fib. Vitamin B 12 supplement added due to B 12 deficiency.   Dr. Roda Shutters recommended DAPT x3 months followed by ASA alone.  Therapy ongoing and patient continued to have limitations in mobility and ADLs.  CIR was recommended for follow-up therapy   HPI The patient has discharged home approximately 10 days ago and has been independent with all his self-care and mobility.  He does not use an assistive device.  His daughter does his shopping and cooking for him.  She also takes him to his appointments.  Patient did not drive prior to his stroke because of a chronic right foot and ankle injury.  He has decreased range of motion in the right foot with intermittent neuropathic pains. He has a past medical history of cervical radiculitis and has residual numbness of his right thumb. In regards to his CVA he still feels a little weak in his left arm.  He feels like he has some fatigue.  He has felt mentally a little bit slow and sometimes has difficulty remembering the right word to say.  Because of lack of funding he does not have any home health therapy.  Daughter notes that he has been anxious at home at times but no crying spells or depression Pain Inventory Average Pain 6 Pain Right Now 6 My pain is constant  In the last 24 hours, has  pain interfered with the following? General activity 2 Relation with others 0 Enjoyment of life 0 What TIME of day is your pain at its worst? morning Sleep (in general) NA  Pain is worse with: walking, bending and standing Pain improves with: no ans Relief from Meds: n/a  Mobility walk without assistance  Function not employed: date last employed .  Neuro/Psych weakness dizziness confusion anxiety  Prior Studies Any changes since last visit?  no  Physicians involved in your care Any changes since last visit?  no   Family History  Problem Relation Age of Onset  . Cancer Mother   . Diabetes Mother   . Diabetes Father   . Hyperlipidemia Father   . Hypertension Father   . Heart attack Father    Social History   Socioeconomic History  . Marital status: Divorced    Spouse name: Not on file  . Number of children: Not on file  . Years of education: Not on file  . Highest education level: Not on file  Occupational History  . Not on file  Tobacco Use  . Smoking status: Former Smoker    Packs/day: 0.50    Years: 36.00    Pack years: 18.00    Types: Cigarettes    Quit date: 01/27/2020    Years since quitting: 0.0  . Smokeless tobacco: Never Used  Substance and Sexual Activity  . Alcohol use: No  Alcohol/week: 0.0 standard drinks  . Drug use: No  . Sexual activity: Not on file  Other Topics Concern  . Not on file  Social History Narrative  . Not on file   Social Determinants of Health   Financial Resource Strain:   . Difficulty of Paying Living Expenses:   Food Insecurity:   . Worried About Programme researcher, broadcasting/film/video in the Last Year:   . Barista in the Last Year:   Transportation Needs:   . Freight forwarder (Medical):   Marland Kitchen Lack of Transportation (Non-Medical):   Physical Activity:   . Days of Exercise per Week:   . Minutes of Exercise per Session:   Stress:   . Feeling of Stress :   Social Connections:   . Frequency of Communication with  Friends and Family:   . Frequency of Social Gatherings with Friends and Family:   . Attends Religious Services:   . Active Member of Clubs or Organizations:   . Attends Banker Meetings:   Marland Kitchen Marital Status:    Past Surgical History:  Procedure Laterality Date  . ABDOMINAL AORTAGRAM N/A 06/26/2013   Procedure: ABDOMINAL AORTAGRAM;  Surgeon: Larina Earthly, MD;  Location: [facility] CATH LAB;  Service: Cardiovascular;  Laterality: N/A;  . AMPUTATION Right 12/04/2013   Procedure: AMPUTATION DIGIT- 3RD TOE RIGHT FOOT ;  Surgeon: Larina Earthly, MD;  Location: [facility] OR;  Service: Vascular;  Laterality: Right;  . AMPUTATION Right 01/15/2014   Procedure: AMPUTATION OF RIGHT 4TH and 5TH TOES;  Surgeon: Larina Earthly, MD;  Location: [facility] OR;  Service: Vascular;  Laterality: Right;  . BACK SURGERY     L1-2  . BACK SURGERY     L1-2  . BACK SURGERY     thinks L1- 2  . COLON SURGERY     removal of colon due to proplase rectum  . COLOSTOMY    . COLOSTOMY CLOSURE  2006ish  . ESOPHAGOGASTRODUODENOSCOPY    . FEMORAL-TIBIAL BYPASS GRAFT Right 06/28/2013   Procedure: BYPASS GRAFT FEMORAL TO TIBIAL PERONEAL TRUNK ARTERY;  Surgeon: Larina Earthly, MD;  Location: [facility] OR;  Service: Vascular;  Laterality: Right;   Past Medical History:  Diagnosis Date  . Arthritis    low back and left hip  . B12 deficiency   . Bradycardia   . Chronic back pain   . Headache(784.0)    occasionally and sinus related   . History of bronchitis    last time about 2-83yrs ago  . PAD (peripheral artery disease) (HCC)   . Peripheral vascular disease (HCC)   . Prolapsed, anus    hx of  . Stroke (HCC) 01/2020   BP 123/80   Pulse 86   Temp 97.9 F (36.6 C)   Ht 6' (1.829 m)   Wt 225 lb (102.1 kg)   SpO2 95%   BMI 30.52 kg/m   Opioid Risk Score:   Fall Risk Score:  `1  Depression screen PHQ 2/9  Depression screen PHQ 2/9 02/17/2020  Decreased Interest 1  Down, Depressed, Hopeless 0  PHQ - 2 Score 1  Altered sleeping 1    Tired, decreased energy 2  Change in appetite 0  Feeling bad or failure about yourself  0  Trouble concentrating 1  Moving slowly or fidgety/restless 1  Suicidal thoughts 0  PHQ-9 Score 6  Difficult doing work/chores Somewhat difficult    Review of Systems  Constitutional: Negative.   HENT:  Negative.   Eyes: Negative.   Respiratory: Negative.   Cardiovascular: Negative.   Gastrointestinal: Negative.   Genitourinary: Negative.   Musculoskeletal: Negative.   Skin: Negative.   Allergic/Immunologic: Negative.   Neurological: Positive for dizziness and weakness.  Hematological: Bruises/bleeds easily.       Plavix  Psychiatric/Behavioral: The patient is nervous/anxious.   All other systems reviewed and are negative.      Objective:   Physical Exam Vitals and nursing note reviewed.  Constitutional:      Appearance: Normal appearance. He is obese.  HENT:     Head: Normocephalic and atraumatic.  Eyes:     Extraocular Movements: Extraocular movements intact.     Conjunctiva/sclera: Conjunctivae normal.     Pupils: Pupils are equal, round, and reactive to light.  Cardiovascular:     Rate and Rhythm: Normal rate and regular rhythm.     Heart sounds: Normal heart sounds. No murmur.  Pulmonary:     Effort: Pulmonary effort is normal. No respiratory distress.     Breath sounds: Normal breath sounds. No stridor.  Abdominal:     General: Abdomen is flat. Bowel sounds are normal. There is no distension.     Palpations: Abdomen is soft. There is no mass.     Tenderness: There is no abdominal tenderness.  Skin:    General: Skin is warm and dry.  Neurological:     General: No focal deficit present.     Mental Status: He is alert and oriented to person, place, and time.     Comments: Motor strength is 5/5 in the right deltoid bicep tricep grip hip flexor knee extensor ankle dorsiflexor 4/5 in the left deltoid bicep tricep grip 4+ in left hip flexor knee extensor ankle  dorsiflexor Gait without assistive device no evidence of toe drag or knee instability.  He is unable to perform tandem gait but is able to toe walk and heel walk.  Patient without dysarthria or aphasia He is oriented x3.  Good historian  Psychiatric:        Mood and Affect: Mood normal.        Behavior: Behavior normal.        Thought Content: Thought content normal.        Judgment: Judgment normal.    Numbness in the first dorsal interosseous space on the right side  Left shoulder negative Hawkins, negative drop arm test negative crossed adduction test negative apprehension test       Assessment & Plan:  #1.  Right MCA distribution infarct he still has mild residual left-sided weakness.  He has subjective cognitive slowing although none discernible on examination today.  His problem with tandem gait may be in part related to his chronic right ankle instability.  Talked about recovery of function he is left with only mild deficits which will likely clear up in another 1 to 2 months.  He may not plateau in his cognitive recovery for up to 12 months from time of stroke.   2.  History of hyperlipidemia discussed DASH diet which is more or less what his daughter is making for him.  I printed out additional information on this.  He will continue on his Lipitor and follow-up with his new primary physician Dr. Odette Horns  #3.  Secondary stroke prophylaxis, congratulated patient on given up smoking.  We also discussed the need to continue with aspirin Plavix and follow-up with neurology this month.  #4.  Left shoulder pain resolved his left  shoulder examination was normal today.  #5.  Chronic cervical radiculitis right C6

## 2020-02-17 NOTE — Patient Instructions (Addendum)
Cognitive recovery is about twice as long as physical recovery  Rec exercise on treadmill  walking 5 days per week  Do home exercise program on a daily basis  Dr Odette Horns will renew your medicines DASH Eating Plan DASH stands for "Dietary Approaches to Stop Hypertension." The DASH eating plan is a healthy eating plan that has been shown to reduce high blood pressure (hypertension). It may also reduce your risk for type 2 diabetes, heart disease, and stroke. The DASH eating plan may also help with weight loss. What are tips for following this plan?  General guidelines  Avoid eating more than 2,300 mg (milligrams) of salt (sodium) a day. If you have hypertension, you may need to reduce your sodium intake to 1,500 mg a day.  Limit alcohol intake to no more than 1 drink a day for nonpregnant women and 2 drinks a day for men. One drink equals 12 oz of beer, 5 oz of wine, or 1 oz of hard liquor.  Work with your health care provider to maintain a healthy body weight or to lose weight. Ask what an ideal weight is for you.  Get at least 30 minutes of exercise that causes your heart to beat faster (aerobic exercise) most days of the week. Activities may include walking, swimming, or biking.  Work with your health care provider or diet and nutrition specialist (dietitian) to adjust your eating plan to your individual calorie needs. Reading food labels   Check food labels for the amount of sodium per serving. Choose foods with less than 5 percent of the Daily Value of sodium. Generally, foods with less than 300 mg of sodium per serving fit into this eating plan.  To find whole grains, look for the word "whole" as the first word in the ingredient list. Shopping  Buy products labeled as "low-sodium" or "no salt added."  Buy fresh foods. Avoid canned foods and premade or frozen meals. Cooking  Avoid adding salt when cooking. Use salt-free seasonings or herbs instead of table salt or sea  salt. Check with your health care provider or pharmacist before using salt substitutes.  Do not fry foods. Cook foods using healthy methods such as baking, boiling, grilling, and broiling instead.  Cook with heart-healthy oils, such as olive, canola, soybean, or sunflower oil. Meal planning  Eat a balanced diet that includes: ? 5 or more servings of fruits and vegetables each day. At each meal, try to fill half of your plate with fruits and vegetables. ? Up to 6-8 servings of whole grains each day. ? Less than 6 oz of lean meat, poultry, or fish each day. A 3-oz serving of meat is about the same size as a deck of cards. One egg equals 1 oz. ? 2 servings of low-fat dairy each day. ? A serving of nuts, seeds, or beans 5 times each week. ? Heart-healthy fats. Healthy fats called Omega-3 fatty acids are found in foods such as flaxseeds and coldwater fish, like sardines, salmon, and mackerel.  Limit how much you eat of the following: ? Canned or prepackaged foods. ? Food that is high in trans fat, such as fried foods. ? Food that is high in saturated fat, such as fatty meat. ? Sweets, desserts, sugary drinks, and other foods with added sugar. ? Full-fat dairy products.  Do not salt foods before eating.  Try to eat at least 2 vegetarian meals each week.  Eat more home-cooked food and less restaurant, buffet, and fast food.  When eating at a restaurant, ask that your food be prepared with less salt or no salt, if possible. What foods are recommended? The items listed may not be a complete list. Talk with your dietitian about what dietary choices are best for you. Grains Whole-grain or whole-wheat bread. Whole-grain or whole-wheat pasta. Brown rice. [place] Morrow. Bulgur. Whole-grain and low-sodium cereals. Pita bread. Low-fat, low-sodium crackers. Whole-wheat flour tortillas. Vegetables Fresh or frozen vegetables (raw, steamed, roasted, or grilled). Low-sodium or reduced-sodium tomato  and vegetable juice. Low-sodium or reduced-sodium tomato sauce and tomato paste. Low-sodium or reduced-sodium canned vegetables. Fruits All fresh, dried, or frozen fruit. Canned fruit in natural juice (without added sugar). Meat and other protein foods Skinless chicken or [place]. Ground chicken or [place]. Pork with fat trimmed off. Fish and seafood. Egg whites. Dried beans, peas, or lentils. Unsalted nuts, nut butters, and seeds. Unsalted canned beans. Lean cuts of beef with fat trimmed off. Low-sodium, lean deli meat. Dairy Low-fat (1%) or fat-free (skim) milk. Fat-free, low-fat, or reduced-fat cheeses. Nonfat, low-sodium ricotta or cottage cheese. Low-fat or nonfat yogurt. Low-fat, low-sodium cheese. Fats and oils Soft margarine without trans fats. Vegetable oil. Low-fat, reduced-fat, or light mayonnaise and salad dressings (reduced-sodium). Canola, safflower, olive, soybean, and sunflower oils. Avocado. Seasoning and other foods Herbs. Spices. Seasoning mixes without salt. Unsalted popcorn and pretzels. Fat-free sweets. What foods are not recommended? The items listed may not be a complete list. Talk with your dietitian about what dietary choices are best for you. Grains Baked goods made with fat, such as croissants, muffins, or some breads. Dry pasta or rice meal packs. Vegetables Creamed or fried vegetables. Vegetables in a cheese sauce. Regular canned vegetables (not low-sodium or reduced-sodium). Regular canned tomato sauce and paste (not low-sodium or reduced-sodium). Regular tomato and vegetable juice (not low-sodium or reduced-sodium). Angie Fava. Olives. Fruits Canned fruit in a light or heavy syrup. Fried fruit. Fruit in cream or butter sauce. Meat and other protein foods Fatty cuts of meat. Ribs. Fried meat. Berniece Salines. Sausage. Bologna and other processed lunch meats. Salami. Fatback. Hotdogs. Bratwurst. Salted nuts and seeds. Canned beans with added salt. Canned or smoked fish. Whole eggs  or egg yolks. Chicken or [place] with skin. Dairy Whole or 2% milk, cream, and half-and-half. Whole or full-fat cream cheese. Whole-fat or sweetened yogurt. Full-fat cheese. Nondairy creamers. Whipped toppings. Processed cheese and cheese spreads. Fats and oils Butter. Stick margarine. Lard. Shortening. Ghee. Bacon fat. Tropical oils, such as coconut, palm kernel, or palm oil. Seasoning and other foods Salted popcorn and pretzels. Onion salt, garlic salt, seasoned salt, table salt, and sea salt. Worcestershire sauce. Tartar sauce. Barbecue sauce. Teriyaki sauce. Soy sauce, including reduced-sodium. Steak sauce. Canned and packaged gravies. Fish sauce. Oyster sauce. Cocktail sauce. Horseradish that you find on the shelf. Ketchup. Mustard. Meat flavorings and tenderizers. Bouillon cubes. Hot sauce and Tabasco sauce. Premade or packaged marinades. Premade or packaged taco seasonings. Relishes. Regular salad dressings. Where to find more information:  National Heart, Lung, and [place]: https://wilson-eaton.com/  American Heart Association: www.heart.org Summary  The DASH eating plan is a healthy eating plan that has been shown to reduce high blood pressure (hypertension). It may also reduce your risk for type 2 diabetes, heart disease, and stroke.  With the DASH eating plan, you should limit salt (sodium) intake to 2,300 mg a day. If you have hypertension, you may need to reduce your sodium intake to 1,500 mg a day.  When on the DASH eating plan,  aim to eat more fresh fruits and vegetables, whole grains, lean proteins, low-fat dairy, and heart-healthy fats.  Work with your health care provider or diet and nutrition specialist (dietitian) to adjust your eating plan to your individual calorie needs. This information is not intended to replace advice given to you by your health care provider. Make sure you discuss any questions you have with your health care provider. Document Revised: 10/13/2017  Document Reviewed: 10/24/2016 Elsevier Patient Education  2020 [employer].

## 2020-02-26 ENCOUNTER — Encounter: Payer: Self-pay | Admitting: Family Medicine

## 2020-02-26 ENCOUNTER — Ambulatory Visit: Payer: Self-pay | Attending: Family Medicine | Admitting: Family Medicine

## 2020-02-26 ENCOUNTER — Other Ambulatory Visit: Payer: Self-pay

## 2020-02-26 VITALS — BP 112/73 | HR 71 | Ht 72.0 in | Wt 228.6 lb

## 2020-02-26 DIAGNOSIS — I63511 Cerebral infarction due to unspecified occlusion or stenosis of right middle cerebral artery: Secondary | ICD-10-CM

## 2020-02-26 DIAGNOSIS — I739 Peripheral vascular disease, unspecified: Secondary | ICD-10-CM

## 2020-02-26 DIAGNOSIS — E538 Deficiency of other specified B group vitamins: Secondary | ICD-10-CM

## 2020-02-26 MED ORDER — CYANOCOBALAMIN 1000 MCG PO TABS: 1000.0000 ug | ORAL_TABLET | Freq: Every day | ORAL | 2 refills | Status: DC

## 2020-02-26 MED ORDER — ATORVASTATIN CALCIUM 40 MG PO TABS: 40.0000 mg | ORAL_TABLET | Freq: Every day | ORAL | 6 refills | Status: DC

## 2020-02-26 MED ORDER — ASPIRIN 325 MG PO TBEC: 325.0000 mg | DELAYED_RELEASE_TABLET | Freq: Every day | ORAL | 1 refills | Status: DC

## 2020-02-26 MED ORDER — CLOPIDOGREL BISULFATE 75 MG PO TABS: 75.0000 mg | ORAL_TABLET | Freq: Every day | ORAL | 1 refills | Status: AC

## 2020-02-26 NOTE — Patient Instructions (Signed)

## 2020-02-26 NOTE — Progress Notes (Signed)
Subjective:  Patient ID: Cody Baird, male    DOB: [birth year]  Age: 59 y.o. MRN: [id number]  CC: Hospitalization Follow-up   HPI Cody Baird is a 59 year old male with a previous history of tobacco abuse recently diagnosed with right MCA stroke in 01/2020.  He presented outside the window for TPA. Echocardiogram revealed EF of 60 to 65%, no regional wall motion abnormality, TEE negative for patent foreman ovale but revealed small insignificant pulmonary shunt.  Seen by Neurology and commenced on dual antiplatelet therapy for 3 months and after that aspirin alone.  Also commenced on vitamin B12 for vitamin B12 deficiency.  He completed comprehensive inpatient rehab and was subsequently discharged. 30-day cardiac monitor recommended at discharge.  He saw Dr Wynn Banker on 02/17/20; never underwent PT due to lack of insurance,. His left hand is slightly weak he states but overall he feel okay and has stopped smoking- was smoking 3 pack/day mostly for the greater part of 41 years. He has no acute concerns today and is compliant with his medications. He follows up with cardiology and neurology on 4/27 and 4/28 respectively. Past Medical History:  Diagnosis Date  . Arthritis    low back and left hip  . B12 deficiency   . Bradycardia   . Chronic back pain   . Headache(784.0)    occasionally and sinus related   . History of bronchitis    last time about 2-80yrs ago  . PAD (peripheral artery disease) (HCC)   . Peripheral vascular disease (HCC)   . Prolapsed, anus    hx of  . Stroke [facility]) 01/2020    Past Surgical History:  Procedure Laterality Date  . ABDOMINAL AORTAGRAM N/A 06/26/2013   Procedure: ABDOMINAL AORTAGRAM;  Surgeon: Larina Earthly, MD;  Location: [facility] CATH LAB;  Service: Cardiovascular;  Laterality: N/A;  . AMPUTATION Right 12/04/2013   Procedure: AMPUTATION DIGIT- 3RD TOE RIGHT FOOT ;  Surgeon: Larina Earthly, MD;  Location: [facility] OR;  Service: Vascular;  Laterality: Right;  . AMPUTATION  Right 01/15/2014   Procedure: AMPUTATION OF RIGHT 4TH and 5TH TOES;  Surgeon: Larina Earthly, MD;  Location: [facility] OR;  Service: Vascular;  Laterality: Right;  . BACK SURGERY     L1-2  . BACK SURGERY     L1-2  . BACK SURGERY     thinks L1- 2  . COLON SURGERY     removal of colon due to proplase rectum  . COLOSTOMY    . COLOSTOMY CLOSURE  2006ish  . ESOPHAGOGASTRODUODENOSCOPY    . FEMORAL-TIBIAL BYPASS GRAFT Right 06/28/2013   Procedure: BYPASS GRAFT FEMORAL TO TIBIAL PERONEAL TRUNK ARTERY;  Surgeon: Larina Earthly, MD;  Location: [facility] OR;  Service: Vascular;  Laterality: Right;    Family History  Problem Relation Age of Onset  . Cancer Mother   . Diabetes Mother   . Diabetes Father   . Hyperlipidemia Father   . Hypertension Father   . Heart attack Father     Allergies  Allergen Reactions  . Penicillins Other (See Comments)    Childhood allergy not sure what the reaction is  Did it involve swelling of the face/tongue/throat, SOB, or low BP? Unknown Did it involve sudden or severe rash/hives, skin peeling, or any reaction on the inside of your mouth or nose? Unknown Did you need to seek medical attention at a hospital or doctor's office? Unknown When did it last happen?childhood If all above answers are "NO", may proceed  with cephalosporin use.     Outpatient Medications Prior to Visit  Medication Sig Dispense Refill  . aspirin 325 MG EC tablet Take 1 tablet (325 mg total) by mouth daily. 100 tablet 0  . atorvastatin (LIPITOR) 40 MG tablet Take 1 tablet (40 mg total) by mouth daily at 6 PM. 30 tablet 0  . clopidogrel (PLAVIX) 75 MG tablet Take 1 tablet (75 mg total) by mouth daily. Take one tab daily for 90 days then stop 30 tablet 0  . vitamin B-12 1000 MCG tablet Take 1 tablet (1,000 mcg total) by mouth daily. 30 tablet 0  . nicotine polacrilex (NICORETTE) 2 MG gum Take 1 each (2 mg total) by mouth as needed for smoking cessation. (Patient not taking: Reported on 02/17/2020) 100  tablet 0   No facility-administered medications prior to visit.     ROS Review of Systems  Constitutional: Negative for activity change and appetite change.  HENT: Negative for sinus pressure and sore throat.   Eyes: Negative for visual disturbance.  Respiratory: Negative for cough, chest tightness and shortness of breath.   Cardiovascular: Negative for chest pain and leg swelling.  Gastrointestinal: Negative for abdominal distention, abdominal pain, constipation and diarrhea.  Endocrine: Negative.   Genitourinary: Negative for dysuria.  Musculoskeletal: Negative for joint swelling and myalgias.  Skin: Negative for rash.  Allergic/Immunologic: Negative.   Neurological: Negative for weakness, light-headedness and numbness.  Psychiatric/Behavioral: Negative for dysphoric mood and suicidal ideas.    Objective:  BP 112/73   Pulse 71   Ht 6' (1.829 m)   Wt 228 lb 9.6 oz (103.7 kg)   SpO2 98%   BMI 31.00 kg/m   BP/Weight 02/26/2020 02/17/2020 2/87/8676  Systolic BP 720 947 096  Diastolic BP 73 80 83  Wt. (Lbs) 228.6 225 -  BMI 31 30.52 -      Physical Exam Constitutional:      Appearance: He is well-developed.  Neck:     Vascular: No JVD.  Cardiovascular:     Rate and Rhythm: Normal rate.     Heart sounds: Normal heart sounds. No murmur.  Pulmonary:     Effort: Pulmonary effort is normal.     Breath sounds: Normal breath sounds. No wheezing or rales.  Chest:     Chest wall: No tenderness.  Abdominal:     General: Bowel sounds are normal. There is no distension.     Palpations: Abdomen is soft. There is no mass.     Tenderness: There is no abdominal tenderness.  Musculoskeletal:        General: Normal range of motion.     Right lower leg: No edema.     Left lower leg: No edema.  Neurological:     Mental Status: He is alert and oriented to person, place, and time.     Cranial Nerves: No cranial nerve deficit.     Sensory: No sensory deficit.     Motor: No  weakness.     Coordination: Coordination normal.     Gait: Gait normal.  Psychiatric:        Mood and Affect: Mood normal.     CMP Latest Ref Rng & Units 02/05/2020 02/03/2020 01/31/2020  Glucose 70 - 99 mg/dL 109(H) 102(H) 99  BUN 6 - 20 mg/dL 13 12 13   Creatinine 0.61 - 1.24 mg/dL 0.99 0.93 0.94  Sodium 135 - 145 mmol/L 137 138 138  Potassium 3.5 - 5.1 mmol/L 3.7 3.6 3.4(L)  Chloride 98 -  111 mmol/L 102 102 105  CO2 22 - 32 mmol/L 25 26 23   Calcium 8.9 - 10.3 mg/dL 9.5 9.1 )  Total Protein 6.5 - 8.1 g/dL - - 6.6  Total Bilirubin 0.3 - 1.2 mg/dL - - 0.9  Alkaline Phos 38 - 126 U/L - - 51  AST 15 - 41 U/L - - 19  ALT 0 - 44 U/L - - 17    Lipid Panel     Component Value Date/Time   CHOL 206 (H) 01/28/2020 0612   TRIG 154 (H) 01/28/2020 0612   HDL 31 (L) 01/28/2020 0612   CHOLHDL 6.6 01/28/2020 0612   VLDL 31 01/28/2020 0612   LDLCALC 144 (H) 01/28/2020 0612    CBC    Component Value Date/Time   WBC 7.7 02/03/2020 0559   RBC 5.36 02/03/2020 0559   HGB 16.3 02/03/2020 0559   HCT 49.9 02/03/2020 0559   PLT 196 02/03/2020 0559   MCV 93.1 02/03/2020 0559   MCH 30.4 02/03/2020 0559   MCHC 32.7 02/03/2020 0559   RDW 12.9 02/03/2020 0559   LYMPHSABS 2.0 01/31/2020 0504   MONOABS 0.9 01/31/2020 0504   EOSABS 0.3 01/31/2020 0504   BASOSABS 0.0 01/31/2020 0504    Lab Results  Component Value Date   HGBA1C 5.7 (H) 01/28/2020    Assessment & Plan:  1. Acute right MCA stroke (HCC) Continue Plavix and aspirin till 04/2020 after that he will continue aspirin alone Risk factor modification Follow-up with neurology Keep appointment with cardiology for event monitor to evaluate for etiology of stroke. - clopidogrel (PLAVIX) 75 MG tablet; Take 1 tablet (75 mg total) by mouth daily. Take one tab daily for 90 days then stop  Dispense: 30 tablet; Refill: 1 - atorvastatin (LIPITOR) 40 MG tablet; Take 1 tablet (40 mg total) by mouth daily at 6 PM.  Dispense: 30 tablet; Refill:  6 - aspirin 325 MG EC tablet; Take 1 tablet (325 mg total) by mouth daily.  Dispense: 100 tablet; Refill: 1  2. Peripheral vascular disease, unspecified (HCC) Stable Risk of modification - atorvastatin (LIPITOR) 40 MG tablet; Take 1 tablet (40 mg total) by mouth daily at 6 PM.  Dispense: 30 tablet; Refill: 6  3. B12 deficiency - cyanocobalamin 1000 MCG tablet; Take 1 tablet (1,000 mcg total) by mouth daily.  Dispense: 30 tablet; Refill: 2   Health Care Maintenance: He will need low-dose CT lung cancer screen at his next visit due to history of tobacco use greater than 30 years.  Advised to apply for the [place] financial discount to facilitate this.   Advised to apply for the [place] financial discount; consider referral for low-dose CT scan for lung cancer screening next visit.  Return in about 3 months (around 05/27/2020) for Chronic disease management.       05/29/2020, MD, FAAFP. [facility] [place], [place]   02/26/2020, 9:28 AM

## 2020-03-09 NOTE — Progress Notes (Signed)
Cardiology Office Note:    Date:  03/10/2020   ID:  Cody Baird, DOB 05-23-1961, MRN [id number]  PCP:  Hoy Register, MD  Cardiologist:  Norman Herrlich, MD   Referring MD: Jacquelynn Cree, PA-C  ASSESSMENT:    1. Cerebrovascular accident (CVA), unspecified mechanism (HCC)   2. Essential hypertension   3. Mixed hyperlipidemia   4. Smoker    PLAN:    In order of problems listed above:  1. Continue current therapy dual antiplatelet and statin he will wear 2-week monitor at this time I do not think he requires a transesophageal echocardiogram recorder unless directed 2. Blood pressure  is at target not on antihypertensives at this time 3. Continue high intensity statin intolerance lifelong treatment 4. Strongly encourage not to start smoking again and given instructions for nicotine substitutes if he feels he is going to fail  Next appointment 6 weeks to review the results of the monitor   Medication Adjustments/Labs and Tests Ordered: Current medicines are reviewed at length with the patient today.  Concerns regarding medicines are outlined above.  Orders Placed This Encounter  Procedures  . LONG TERM MONITOR (3-14 DAYS)   No orders of the defined types were placed in this encounter.    Chief Complaint  Patient presents with  . Follow-up    Recent hospitalization with stroke    History of Present Illness:    Cody Baird is a 59 y.o. male who is being seen today for the evaluation of an event monitor at the request of Love, Evlyn Kanner, PA-C.  Background history of peripheral arterial disease with ongoing cigarette smoking.  He was admitted to [facility] 01/30/2020 to 02/06/2020 with acute right MCA stroke with multi focal stroke on MR.  His echocardiogram showed normal left ventricular ejection fraction and transesophageal echocardiogram was negative for PFO.  I independently reviewed his echocardiogram and agree with the report.  With stroke of embolic origin a  30-day monitor was recommended.  His EKG 02/03/2020 independently reviewed sinus rhythm normal no atrial abnormality.  TIA showed the bilateral common carotid and internal carotid vertebral arteries are patent with mild mixed plaque in the proximal left internal carotid artery.  He was seen by neurology during the hospitalization with recommendations for outpatient event monitor regarding atrial fibrillation.  With aspirin and clopidogrel.  He has found to have moderate distal right-sided M1 high-grade stenosis.  His daughter is present participated in the evaluation decision making.  Since discharge she is improved he said his emotions are little bit labile speech has recovered and is not having focal weakness.  Fortunately he has not resumed smoking.  Is compliant with his dual antiplatelet therapy and statin.  I reviewed with the patient the thought process behind looking for embolic stroke and applying a 2-week event monitor but I do not think he needs a transesophageal echocardiogram or an implanted loop recorder as he appears to have focal intracerebral carotid stenosis accounting for stroke.  He is scheduled to see neurology in follow-up he has no known heart disease and has had no angina dyspnea palpitations syncope tells me as a young man he had extra heartbeats and saw his family doctor told his mother not to worry about it.  He probably has a severe underlying lipid disorder as he has xanthelasma and I strongly encouraged him to follow medications including high intensity statin Past Medical History:  Diagnosis Date  . Acute right MCA stroke (HCC) 01/30/2020  .  Aftercare following surgery of the circulatory system, NEC 01/14/2014  . Arthritis    low back and left hip  . 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 Atherosclerotic peripheral vascular disease with rest pain (HCC) 10/30/2013  . B12 deficiency   . Bradycardia   . Chronic back pain   .  CVA (cerebral vascular accident) (HCC) 01/28/2020  . Essential hypertension 01/28/2020  . Headache(784.0)    occasionally and sinus related   . History of bronchitis    last time about 2-11yrs ago  . Ischemic pain of foot 12/24/2013  . PAD (peripheral artery disease) (HCC)   . Pain in limb 06/25/2013  . PBA (pseudobulbar affect)   . Peripheral vascular disease (HCC)   . Peripheral vascular disease, unspecified (HCC) 06/25/2013  . Post-op pain 09/17/2013  . Post-operative pain 07/16/2013  . Prolapsed, anus    hx of  . PVD (peripheral vascular disease) (HCC) 09/17/2013  . Stroke (HCC) 01/2020  . TIA (transient ischemic attack) 01/28/2020  . Tobacco abuse 01/28/2020  . Venous stasis ulcer of ankle, right (HCC) 02/17/2020    Past Surgical History:  Procedure Laterality Date  . ABDOMINAL AORTAGRAM N/A 06/26/2013   Procedure: ABDOMINAL AORTAGRAM;  Surgeon: Larina Earthly, MD;  Location: [facility] CATH LAB;  Service: Cardiovascular;  Laterality: N/A;  . AMPUTATION Right 12/04/2013   Procedure: AMPUTATION DIGIT- 3RD TOE RIGHT FOOT ;  Surgeon: Larina Earthly, MD;  Location: [facility] OR;  Service: Vascular;  Laterality: Right;  . AMPUTATION Right 01/15/2014   Procedure: AMPUTATION OF RIGHT 4TH and 5TH TOES;  Surgeon: Larina Earthly, MD;  Location: [facility] OR;  Service: Vascular;  Laterality: Right;  . BACK SURGERY     L1-2  . BACK SURGERY     L1-2  . BACK SURGERY     thinks L1- 2  . COLON SURGERY     removal of colon due to proplase rectum  . COLOSTOMY    . COLOSTOMY CLOSURE  2006ish  . ESOPHAGOGASTRODUODENOSCOPY    . FEMORAL-TIBIAL BYPASS GRAFT Right 06/28/2013   Procedure: BYPASS GRAFT FEMORAL TO TIBIAL PERONEAL TRUNK ARTERY;  Surgeon: Larina Earthly, MD;  Location: [facility] OR;  Service: Vascular;  Laterality: Right;    Current Medications: Current Meds  Medication Sig  . aspirin 325 MG EC tablet Take 1 tablet (325 mg total) by mouth daily.  Marland Kitchen atorvastatin (LIPITOR) 40 MG tablet Take 1 tablet (40 mg total) by mouth daily at  6 PM.  . clopidogrel (PLAVIX) 75 MG tablet Take 1 tablet (75 mg total) by mouth daily. Take one tab daily for 90 days then stop  . cyanocobalamin 1000 MCG tablet Take 1 tablet (1,000 mcg total) by mouth daily.     Allergies:   Penicillins   Social History   Socioeconomic History  . Marital status: Divorced    Spouse name: Not on file  . Number of children: Not on file  . Years of education: Not on file  . Highest education level: Not on file  Occupational History  . Not on file  Tobacco Use  . Smoking status: Former Smoker    Packs/day: 0.50    Years: 36.00    Pack years: 18.00    Types: Cigarettes    Quit date: 01/27/2020    Years since quitting: 0.1  . Smokeless tobacco: Never Used  Substance and Sexual Activity  . Alcohol use: No    Alcohol/week: 0.0 standard drinks  .  Drug use: No  . Sexual activity: Not on file  Other Topics Concern  . Not on file  Social History Narrative  . Not on file   Social Determinants of Health   Financial Resource Strain:   . Difficulty of Paying Living Expenses:   Food Insecurity:   . Worried About Charity fundraiser in the Last Year:   . Arboriculturist in the Last Year:   Transportation Needs:   . Film/video editor (Medical):   Marland Kitchen Lack of Transportation (Non-Medical):   Physical Activity:   . Days of Exercise per Week:   . Minutes of Exercise per Session:   Stress:   . Feeling of Stress :   Social Connections:   . Frequency of Communication with Friends and Family:   . Frequency of Social Gatherings with Friends and Family:   . Attends Religious Services:   . Active Member of Clubs or Organizations:   . Attends Archivist Meetings:   Marland Kitchen Marital Status:      Family History: The patient's family history includes Cancer in his mother; Diabetes in his father and mother; Heart attack in his father; Hyperlipidemia in his father; Hypertension in his father.  ROS:   Review of Systems  Constitution: Negative.    HENT: Negative.   Eyes: Negative.   Cardiovascular: Negative.   Respiratory: Negative.   Endocrine: Negative.   Hematologic/Lymphatic: Negative.   Skin: Negative.   Musculoskeletal: Negative.   Gastrointestinal: Negative.   Genitourinary: Negative.   Neurological: Positive for brief paralysis.  Psychiatric/Behavioral: Negative.   Allergic/Immunologic: Negative.    Please see the history of present illness.     All other systems reviewed and are negative.  EKGs/Labs/Other Studies Reviewed:    The following studies were reviewed today: I reviewed hospital records with the patient and his daughter    Recent Labs: 01/28/2020: TSH 1.784 01/31/2020: ALT 17 02/03/2020: Hemoglobin 16.3; Platelets 196 02/05/2020: BUN 13; Creatinine, Ser 0.99; Potassium 3.7; Sodium 137  Recent Lipid Panel    Component Value Date/Time   CHOL 206 (H) 01/28/2020 0612   TRIG 154 (H) 01/28/2020 0612   HDL 31 (L) 01/28/2020 0612   CHOLHDL 6.6 01/28/2020 0612   VLDL 31 01/28/2020 0612   LDLCALC 144 (H) 01/28/2020 0612    Physical Exam:    VS:  BP 114/80   Pulse 66   Temp (!) 97.4 F (36.3 C)   Ht 6' (1.829 m)   Wt 230 lb 12.8 oz (104.7 kg)   SpO2 96%   BMI 31.30 kg/m     Wt Readings from Last 3 Encounters:  03/10/20 230 lb 12.8 oz (104.7 kg)  02/26/20 228 lb 9.6 oz (103.7 kg)  02/17/20 225 lb (102.1 kg)     GEN:  Well nourished, well developed in no acute distress HEENT: Normal NECK: No JVD; No carotid bruits LYMPHATICS: No lymphadenopathy CARDIAC: RRR, no murmurs, rubs, gallops RESPIRATORY:  Clear to auscultation without rales, wheezing or rhonchi  ABDOMEN: Soft, non-tender, non-distended MUSCULOSKELETAL:  No edema; No deformity  SKIN: Warm and dry NEUROLOGIC:  Alert and oriented x 3 PSYCHIATRIC:  Normal affect     Signed, Shirlee More, MD  03/10/2020 10:50 AM    [place]

## 2020-03-10 ENCOUNTER — Other Ambulatory Visit: Payer: Self-pay

## 2020-03-10 ENCOUNTER — Ambulatory Visit (INDEPENDENT_AMBULATORY_CARE_PROVIDER_SITE_OTHER): Payer: Self-pay | Admitting: Cardiology

## 2020-03-10 ENCOUNTER — Ambulatory Visit (INDEPENDENT_AMBULATORY_CARE_PROVIDER_SITE_OTHER): Payer: Self-pay

## 2020-03-10 VITALS — BP 114/80 | HR 66 | Temp 97.4°F | Ht 72.0 in | Wt 230.8 lb

## 2020-03-10 DIAGNOSIS — F17200 Nicotine dependence, unspecified, uncomplicated: Secondary | ICD-10-CM

## 2020-03-10 DIAGNOSIS — I639 Cerebral infarction, unspecified: Secondary | ICD-10-CM

## 2020-03-10 DIAGNOSIS — E782 Mixed hyperlipidemia: Secondary | ICD-10-CM

## 2020-03-10 DIAGNOSIS — I10 Essential (primary) hypertension: Secondary | ICD-10-CM

## 2020-03-10 NOTE — Patient Instructions (Signed)
Medication Instructions:  Your physician recommends that you continue on your current medications as directed. Please refer to the Current Medication list given to you today.  *If you need a refill on your cardiac medications before your next appointment, please call your pharmacy*   Lab Work: None If you have labs (blood work) drawn today and your tests are completely normal, you will receive your results only by: . MyChart Message (if you have MyChart) OR . A paper copy in the mail If you have any lab test that is abnormal or we need to change your treatment, we will call you to review the results.   Testing/Procedures: A zio monitor was ordered today. It will remain on for 14 days. You will then return monitor and event diary in provided box. It takes 1-2 weeks for report to be downloaded and returned to us. We will call you with the results. If monitor falls off or has orange flashing light, please call Zio for further instructions.      Follow-Up: At [facility], you and your health needs are our priority.  As part of our continuing mission to provide you with exceptional heart care, we have created designated Provider Care Teams.  These Care Teams include your primary Cardiologist (physician) and Advanced Practice Providers (APPs -  Physician Assistants and Nurse Practitioners) who all work together to provide you with the care you need, when you need it.  We recommend signing up for the patient portal called "MyChart".  Sign up information is provided on this After Visit Summary.  MyChart is used to connect with patients for Virtual Visits (Telemedicine).  Patients are able to view lab/test results, encounter notes, upcoming appointments, etc.  Non-urgent messages can be sent to your provider as well.   To learn more about what you can do with MyChart, go to [email or web address].    Your next appointment:   6 week(s)  The format for your next appointment:   In  Person  Provider:   Brian Munley, MD   Other Instructions   

## 2020-03-11 ENCOUNTER — Ambulatory Visit: Payer: Self-pay | Admitting: Adult Health

## 2020-03-11 ENCOUNTER — Encounter: Payer: Self-pay | Admitting: Adult Health

## 2020-03-11 VITALS — BP 134/86 | HR 62 | Temp 97.8°F | Ht 72.0 in | Wt 231.6 lb

## 2020-03-11 DIAGNOSIS — Z87891 Personal history of nicotine dependence: Secondary | ICD-10-CM

## 2020-03-11 DIAGNOSIS — I10 Essential (primary) hypertension: Secondary | ICD-10-CM

## 2020-03-11 DIAGNOSIS — R29818 Other symptoms and signs involving the nervous system: Secondary | ICD-10-CM

## 2020-03-11 DIAGNOSIS — I6521 Occlusion and stenosis of right carotid artery: Secondary | ICD-10-CM

## 2020-03-11 DIAGNOSIS — I63511 Cerebral infarction due to unspecified occlusion or stenosis of right middle cerebral artery: Secondary | ICD-10-CM

## 2020-03-11 DIAGNOSIS — E785 Hyperlipidemia, unspecified: Secondary | ICD-10-CM

## 2020-03-11 NOTE — Progress Notes (Signed)
[facility] [address] [place]. [place] [address] [phone]       HOSPITAL FOLLOW UP NOTE  [name] Date of Birth:  1961-09-02 Medical Record Number:  [id number]   Reason for Referral:  hospital stroke follow up    SUBJECTIVE:   CHIEF COMPLAINT:  Chief Complaint  Patient presents with  . Hospitalization Follow-up    Txt rm, alone.  No therapy (had rehab in [facility]).  PCP-Dr. Alvis Lemmings  . Cerebrovascular Accident    HPI:    Mr. NATALIO SALOIS is a 59 y.o. male with history of PVD s/p fem pop with multiple toe amputations, heavy smoker and temporary colostomy for prolapsed rectum who presented on 01/27/2020 with 1-day hx L sided weakness and slurred speech.  Stroke work-up revealed scattered right MCA territory infarct, embolic pattern, etiology uncertain concerning for large vessel source with evidence of moderate distal right M1 stenosis.  Also recommended 30-day cardiac event monitor to rule out atrial fibrillation.  Hypercoagulable labs negative.  UDS positive for THC.  Recommended DAPT for 3 months then aspirin alone given high-grade intracranial stenosis per Dr. Roda Shutters.  History of HTN stable.  LDL 144 initiate atorvastatin 40 mg daily.  Other stroke risk factors include B12 deficiency, heavy tobacco use approx 3pack/day for 41 years and PVD s/p fem pop with no prior history of stroke.  Residual deficits of mild left facial droop and left hemiparesis and discharged to CIR for ongoing therapy needs.  He was eventually discharged home on 02/06/2020.  Stroke: scattered R MCA territory infarct, embolic pattern, etiology uncertain, concerning for large vessel source  Code Stroke CT head No acute abnormality.   CTA head no LVO. IC atherosclerosis: moderate distal R M1, high-grade L A3-4, R VA stenosis  CTA neck mixed plaque proximal L ICA. pulm emphysema  MRI  Multifocal R MCA territory infarcts   LE Doppler  No DVT  2D Echo EF 60-65%. No source of embolus     Pt refused TEE  TCD bubble study negative for PFO, but possible insignificant small pulmonary shunt  Will consider 30 day cardiac event monitoring as outpt to rule out afib   Hypercoagulable labs neg   UDS positive for THC  LDL 144  HgbA1c 5.7  Lovenox 40 mg sq daily for VTE prophylaxis  No antithrombotic prior to admission, now on aspirin 325 mg daily and clopidogrel 75 mg daily. Given high-grade intracranial stenosis, continue DAPT x 3 months then aspirin alone.  Therapy recommendations:  CIR    Today, 03/11/2020, Mr. Hoeffner is being seen for hospital follow-up.  He has recovered well since discharge with improvement of motor strength and ambulation and only complaints are occasional numbness/tingling left hand/wrist and slower cognition with increased fatigue after exertion.  He does endorse ongoing improvement.  He did not participate in any additional therapies after CIR due to lack of insurance coverage.  He has completely quit smoking since discharge.  He does report change in dietary habits and is aware of need for weight loss.  Continues on DAPT without bleeding or bruising.  Continues on atorvastatin without myalgias.  Blood pressure today 134/86. Evaluated by cardiology yesterday who ordered 14-day cardiac monitor as recommended at discharge to assess for possible atrial fibrillation.  No concerns at this time.      ROS:   14 system review of systems performed and negative with exception of fatigue, snoring  PMH:  Past Medical History:  Diagnosis Date  . Acute  right MCA stroke (HCC) 01/30/2020  . Aftercare following surgery of the circulatory system, NEC 01/14/2014  . Arthritis    low back and left hip  . 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 Atherosclerotic peripheral vascular disease with rest pain (HCC) 10/30/2013  . B12 deficiency   . Bradycardia   . Chronic back pain   . CVA (cerebral vascular accident) (HCC)  01/28/2020  . Essential hypertension 01/28/2020  . Headache(784.0)    occasionally and sinus related   . History of bronchitis    last time about 2-26yrs ago  . Ischemic pain of foot 12/24/2013  . PAD (peripheral artery disease) (HCC)   . Pain in limb 06/25/2013  . PBA (pseudobulbar affect)   . Peripheral vascular disease (HCC)   . Peripheral vascular disease, unspecified (HCC) 06/25/2013  . Post-op pain 09/17/2013  . Post-operative pain 07/16/2013  . Prolapsed, anus    hx of  . PVD (peripheral vascular disease) (HCC) 09/17/2013  . Stroke (HCC) 01/2020  . TIA (transient ischemic attack) 01/28/2020  . Tobacco abuse 01/28/2020  . Venous stasis ulcer of ankle, right (HCC) 02/17/2020    PSH:  Past Surgical History:  Procedure Laterality Date  . ABDOMINAL AORTAGRAM N/A 06/26/2013   Procedure: ABDOMINAL AORTAGRAM;  Surgeon: Larina Earthly, MD;  Location: [facility] [place] CATH LAB;  Service: Cardiovascular;  Laterality: N/A;  . AMPUTATION Right 12/04/2013   Procedure: AMPUTATION DIGIT- 3RD TOE RIGHT FOOT ;  Surgeon: Larina Earthly, MD;  Location: [facility] OR;  Service: Vascular;  Laterality: Right;  . AMPUTATION Right 01/15/2014   Procedure: AMPUTATION OF RIGHT 4TH and 5TH TOES;  Surgeon: Larina Earthly, MD;  Location: [facility] OR;  Service: Vascular;  Laterality: Right;  . BACK SURGERY     L1-2  . BACK SURGERY     L1-2  . BACK SURGERY     thinks L1- 2  . COLON SURGERY     removal of colon due to proplase rectum  . COLOSTOMY    . COLOSTOMY CLOSURE  2006ish  . ESOPHAGOGASTRODUODENOSCOPY    . FEMORAL-TIBIAL BYPASS GRAFT Right 06/28/2013   Procedure: BYPASS GRAFT FEMORAL TO TIBIAL PERONEAL TRUNK ARTERY;  Surgeon: Larina Earthly, MD;  Location: [facility] OR;  Service: Vascular;  Laterality: Right;    Social History:  Social History   Socioeconomic History  . Marital status: Divorced    Spouse name: Not on file  . Number of children: Not on file  . Years of education: Not on file  . Highest education level: Not on file    Occupational History  . Not on file  Tobacco Use  . Smoking status: Former Smoker    Packs/day: 0.50    Years: 36.00    Pack years: 18.00    Types: Cigarettes    Quit date: 01/27/2020    Years since quitting: 0.1  . Smokeless tobacco: Never Used  Substance and Sexual Activity  . Alcohol use: No    Alcohol/week: 0.0 standard drinks  . Drug use: No  . Sexual activity: Not on file  Other Topics Concern  . Not on file  Social History Narrative  . Not on file   Social Determinants of Health   Financial Resource Strain:   . Difficulty of Paying Living Expenses:   Food Insecurity:   . Worried About Programme researcher, broadcasting/film/video in the Last Year:   . Barista in the Last Year:   [employer]  Needs:   . Lack of Transportation (Medical):   Marland Kitchen Lack of Transportation (Non-Medical):   Physical Activity:   . Days of Exercise per Week:   . Minutes of Exercise per Session:   Stress:   . Feeling of Stress :   Social Connections:   . Frequency of Communication with Friends and Family:   . Frequency of Social Gatherings with Friends and Family:   . Attends Religious Services:   . Active Member of Clubs or Organizations:   . Attends Archivist Meetings:   Marland Kitchen Marital Status:   Intimate Partner Violence:   . Fear of Current or Ex-Partner:   . Emotionally Abused:   Marland Kitchen Physically Abused:   . Sexually Abused:     Family History:  Family History  Problem Relation Age of Onset  . Cancer Mother   . Diabetes Mother   . Diabetes Father   . Hyperlipidemia Father   . Hypertension Father   . Heart attack Father     Medications:   Current Outpatient Medications on File Prior to Visit  Medication Sig Dispense Refill  . aspirin 325 MG EC tablet Take 1 tablet (325 mg total) by mouth daily. 100 tablet 1  . atorvastatin (LIPITOR) 40 MG tablet Take 1 tablet (40 mg total) by mouth daily at 6 PM. 30 tablet 6  . clopidogrel (PLAVIX) 75 MG tablet Take 1 tablet (75 mg total) by mouth  daily. Take one tab daily for 90 days then stop 30 tablet 1  . cyanocobalamin 1000 MCG tablet Take 1 tablet (1,000 mcg total) by mouth daily. 30 tablet 2   No current facility-administered medications on file prior to visit.    Allergies:   Allergies  Allergen Reactions  . Penicillins Other (See Comments)    Childhood allergy not sure what the reaction is  Did it involve swelling of the face/tongue/throat, SOB, or low BP? Unknown Did it involve sudden or severe rash/hives, skin peeling, or any reaction on the inside of your mouth or nose? Unknown Did you need to seek medical attention at a hospital or doctor's office? Unknown When did it last happen?childhood If all above answers are "NO", may proceed with cephalosporin use.       OBJECTIVE:  Physical Exam  Vitals:   03/11/20 1348  BP: 134/86  Pulse: 62  Temp: 97.8 F (36.6 C)  Weight: 231 lb 9.6 oz (105.1 kg)  Height: 6' (1.829 m)   Body mass index is 31.41 kg/m. No exam data present  Depression screen [facility] 2/9 03/11/2020  Decreased Interest 1  Down, Depressed, Hopeless 0  PHQ - 2 Score 1  Altered sleeping -  Tired, decreased energy -  Change in appetite -  Feeling bad or failure about yourself  -  Trouble concentrating -  Moving slowly or fidgety/restless -  Suicidal thoughts -  PHQ-9 Score -  Difficult doing work/chores -     General: well developed, well nourished,  pleasant middle-age Caucasian male, seated, in no evident distress Head: head normocephalic and atraumatic.   Neck: supple with no carotid or supraclavicular bruits Cardiovascular: regular rate and rhythm, no murmurs Musculoskeletal: no deformity Skin:  no rash/petichiae Vascular:  PVD lower extremity skin color changes and multiple toe limitations   Neurologic Exam Mental Status: Awake and fully alert.  Normal speech and language. Oriented to place and time. Recent and remote memory intact. Attention span, concentration and fund of  knowledge appropriate. Mood and affect appropriate.  Cranial Nerves: Fundoscopic exam reveals sharp disc margins. Pupils equal, briskly reactive to light. Extraocular movements full without nystagmus. Visual fields full to confrontation. Hearing intact. Facial sensation intact. Face, tongue, palate moves normally and symmetrically.  Motor: Normal bulk and tone. Normal strength in all tested extremity muscles. Sensory.: intact to touch , pinprick , position and vibratory sensation.  Coordination: Rapid alternating movements normal in all extremities. Finger-to-nose and heel-to-shin performed accurately bilaterally. Gait and Station: Arises from chair without difficulty. Stance is normal. Gait demonstrates normal stride length and balance Reflexes: 1+ and symmetric. Toes downgoing.     NIHSS  0 Modified Rankin  1      ASSESSMENT: DARIEN KADING is a 59 y.o. year old male presented with 1 day history of left-sided weakness and slurred speech on 01/27/2020 with stroke work-up revealing scattered right MCA territory infarct, embolic pattern secondary to unclear source concerning for large vessel source with moderate distal right M1 stenosis. Vascular risk factors include PVD s/p fem pop, tobacco use, B12 deficiency, high-grade intracranial stenosis, HTN and HLD.  Recovered well from a stroke standpoint with residual delayed cognition and fatigue     PLAN:  1. Right MCA stroke:  -Advised subjective complaints in regards to cognition will likely improve and highly encouraged increasing daily activity, ensuring healthy diet, adequate sleep and managing stroke risk factors. -Complete cardiac event monitor to rule out atrial fibrillation as unable to completely rule out during admission -Continue aspirin 325 mg daily and clopidogrel 75 mg daily  and atorvastatin for secondary stroke prevention.  Complete 3 months DAPT given high-grade intracranial stenosis and then aspirin alone.  Maintain strict  control of hypertension with blood pressure goal below 130/90, diabetes with hemoglobin A1c goal below 6.5% and cholesterol with LDL cholesterol (bad cholesterol) goal below 70 mg/dL.  I also advised the patient to eat a healthy diet with plenty of whole grains, cereals, fruits and vegetables, exercise regularly with at least 30 minutes of continuous activity daily and maintain ideal body weight. 2. HTN: Stable.  Continue to follow with PCP for monitoring management 3. HLD: Continuation of atorvastatin follow-up with PCP for ongoing prescribing, monitoring and management as well as repeat lipid panel 4. High-grade intracranial stenosis: Complete 3 months DAPT and continue on aspirin alone as well as continuation of statin.  Discussion regarding importance of managing stroke risk factors as well as avoidance of tobacco use, adequate exercise and healthy diet.  As patient currently asymptomatic, no indication for repeat imaging as current treatment plan would not change 5. Tobacco use: Congratulated on complete cessation and highly encouraged continued cessation 6. Suspect sleep apnea: Referral placed to [facility] sleep clinic for evaluation of possible sleep apnea due to subjective complaints and high risk cardiovascular risk factors.  Provided patient with [facility] financial assistance paperwork and will proceed with scheduling sleep clinic evaluation with approval    Follow up in 4 months or call earlier if needed   I spent 45 minutes of face-to-face and non-face-to-face time with patient.  This included previsit chart review, lab review, study review, order entry, electronic health record documentation, patient education regarding recent stroke, residual deficits, importance of managing stroke risk factors and answered all questions to patient satisfaction     Ihor Austin, [facility]  [facility] [address] [place], [place] [address]  Phone [phone] Fax  [phone] Note: This document was prepared with digital dictation and possible smart phrase technology. Any transcriptional errors that result from this  process are unintentional.

## 2020-03-11 NOTE — Patient Instructions (Addendum)
Referral placed to [facility] sleep clinic for evaluation of possible sleep apnea  Please fill out paperwork for [facility] financial assistance program in order to undergo additional tests as well as additional financial assistance for underlying bills   Continue aspirin 325 mg daily and clopidogrel 75 mg daily  and lipitor  for secondary stroke prevention  Continue plavix for additional 2 months and then discontinue  Continue to follow up with PCP regarding cholesterol and blood pressure management   Complete cardiac monitor for possible atrial fibrillation as well as ongoing follow up with cardiology   Continue to monitor blood pressure at home  Maintain strict control of hypertension with blood pressure goal below 130/90, diabetes with hemoglobin A1c goal below 6.5% and cholesterol with LDL cholesterol (bad cholesterol) goal below 70 mg/dL. I also advised the patient to eat a healthy diet with plenty of whole grains, cereals, fruits and vegetables, exercise regularly and maintain ideal body weight.  Followup in the future with me in 4 months or call earlier if needed       Thank you for coming to see [place] at [facility]. I hope we have been able to provide you high quality care today.  You may receive a patient satisfaction survey over the next few weeks. We would appreciate your feedback and comments so that we may continue to improve ourselves and the health of our patients.

## 2020-03-16 NOTE — Progress Notes (Signed)
I agree with the above plan 

## 2020-04-20 NOTE — Progress Notes (Addendum)
Cardiology Office Note:    Date:  04/21/2020   ID:  Cody Baird, DOB 1961-11-13, MRN [id number]  PCP:  Hoy Register, MD  Cardiologist:  Norman Herrlich, MD    Referring MD: Hoy Register, MD    ASSESSMENT:    1. Cerebrovascular accident (CVA), unspecified mechanism (HCC)   2. Essential hypertension   3. Mixed hyperlipidemia    PLAN:    In order of problems listed above:  1. Unfortunately we have not documented atrial fibrillation including ambulatory heart rhythm monitoring.  I think he is at risk for atrial fibrillation medications implanted loop recorder with documented paroxysmal atrial fibrillation we will plan for anticoagulant therapy I reviewed this patient is frustrated but he agrees will be set up at our [place] office. 2. At target continue current antihypertensives 3. Continue high intensity statin check liver function lipid profile goal LDL less than 70   Next appointment: 3 months   Medication Adjustments/Labs and Tests Ordered: Current medicines are reviewed at length with the patient today.  Concerns regarding medicines are outlined above.  No orders of the defined types were placed in this encounter.  No orders of the defined types were placed in this encounter.   No chief complaint on file.   History of Present Illness:    Cody Baird is a 59 y.o. male with a hx of stroke and peripheral arterial disease last seen 03/10/2020.He was admitted to [facility] 01/30/2020 to 02/06/2020 with acute right MCA stroke with multi focal stroke on MR.  His echocardiogram showed normal left ventricular ejection fraction and transesophageal echocardiogram was negative for PFO.  I independently reviewed his echocardiogram and agree with the report.  With stroke of embolic origin a 30-day monitor was recommended.  His EKG 02/03/2020 independently reviewed sinus rhythm normal no atrial abnormality.    Duplex showed the bilateral common carotid and internal carotid  vertebral arteries are patent with mild mixed plaque in the proximal left internal carotid artery.  He was seen by neurology during the hospitalization with recommendations for outpatient event monitor regarding atrial fibrillation.  He was treated With aspirin and clopidogrel.  He has found to have moderate distal right-sided M1 high-grade stenosis.   Compliance with diet, lifestyle and medications: Yes  He is slowly improved is not smoking has had no recurrent neurologic event.  Tolerates dual antiplatelet therapy without bleeding in his statin without muscle weakness in his case I think he would benefit from implantable loop recorder since still with a high degree suspicion for cardiac source of stroke I reviewed that with him he agrees to be set up with plans for office and we will recheck his liver function renal function lipid profile today.  I reviewed his 14-day ZIO monitor today that shows sinus rhythm rare ventricular and supraventricular ectopy and 37 brief episodes of paroxysmal atrial tachycardia.  Reviewed the results with the patient. Past Medical History:  Diagnosis Date  . Acute right MCA stroke (HCC) 01/30/2020  . Aftercare following surgery of the circulatory system, NEC 01/14/2014  . Arthritis    low back and left hip  . 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 Atherosclerotic peripheral vascular disease with rest pain (HCC) 10/30/2013  . B12 deficiency   . Bradycardia   . Chronic back pain   . CVA (cerebral vascular accident) (HCC) 01/28/2020  . Essential hypertension 01/28/2020  . Headache(784.0)    occasionally and sinus related   . History  of bronchitis    last time about 2-41yrs ago  . Ischemic pain of foot 12/24/2013  . PAD (peripheral artery disease) ([place])   . Pain in limb 06/25/2013  . PBA (pseudobulbar affect)   . Peripheral vascular disease ([place])   . Peripheral vascular disease, unspecified ([place]) 06/25/2013  . Post-op  pain 09/17/2013  . Post-operative pain 07/16/2013  . Prolapsed, anus    hx of  . PVD (peripheral vascular disease) ([place]) 09/17/2013  . Stroke ([place]) 01/2020  . TIA (transient ischemic attack) 01/28/2020  . Tobacco abuse 01/28/2020  . Venous stasis ulcer of ankle, right ([place]) 02/17/2020    Past Surgical History:  Procedure Laterality Date  . ABDOMINAL AORTAGRAM N/A 06/26/2013   Procedure: ABDOMINAL AORTAGRAM;  Surgeon: Rosetta Posner, MD;  Location[facility] C[facility] LAB;  Service: Cardiovascular;  Laterality: N/A;  . AMPUTATION Right 12/04/2013   Procedure: AMPUTATION DIGIT- 3RD TOE RIGHT FOOT ;  Surgeon: Rosetta Posner, MD;  Location: [place];  Service: Vascular;  Laterality: Right;  . AMPUTATION Right 01/15/2014   Procedure: AMPUTATION OF RIGHT 4TH and 5TH TOES;  Surgeon: Rosetta Posner, MD;  Location: [place];  Service: Vascular;  Laterality: Right;  . BACK SURGERY     L1-2  . BACK SURGERY     L1-2  . BACK SURGERY     thinks L1- 2  . COLON SURGERY     removal of colon due to proplase rectum  . COLOSTOMY    . COLOSTOMY CLOSURE  2006ish  . ESOPHAGOGASTRODUODENOSCOPY    . FEMORAL-TIBIAL BYPASS GRAFT Right 06/28/2013   Procedure: BYPASS GRAFT FEMORAL TO TIBIAL PERONEAL TRUNK ARTERY;  Surgeon: Rosetta Posner, MD;  Location: [facility] OR;  Service: Vascular;  Laterality: Right;    Current Medications: Current Meds  Medication Sig  . aspirin 325 MG EC tablet Take 1 tablet (325 mg total) by mouth daily.  Marland Kitchen atorvastatin (LIPITOR) 40 MG tablet Take 1 tablet (40 mg total) by mouth daily at 6 PM.  . clopidogrel (PLAVIX) 75 MG tablet Take 1 tablet (75 mg total) by mouth daily. Take one tab daily for 90 days then stop  . cyanocobalamin 1000 MCG tablet Take 1 tablet (1,000 mcg total) by mouth daily.     Allergies:   Penicillins   Social History   Socioeconomic History  . Marital status: Divorced    Spouse name: Not on file  . Number of children: Not on file  . Years of education: Not on file  . Highest education level:  Not on file  Occupational History  . Not on file  Tobacco Use  . Smoking status: Former Smoker    Packs/day: 0.50    Years: 36.00    Pack years: 18.00    Types: Cigarettes    Quit date: 01/27/2020    Years since quitting: 0.2  . Smokeless tobacco: Never Used  Substance and Sexual Activity  . Alcohol use: No    Alcohol/week: 0.0 standard drinks  . Drug use: No  . Sexual activity: Not on file  Other Topics Concern  . Not on file  Social History Narrative  . Not on file   Social Determinants of Health   Financial Resource Strain:   . Difficulty of Paying Living Expenses:   Food Insecurity:   . Worried About Charity fundraiser in the Last Year:   . [place] in the Last Year:   Transportation Needs:   . Lack of  Transportation (Medical):   Marland Kitchen Lack of Transportation (Non-Medical):   Physical Activity:   . Days of Exercise per Week:   . Minutes of Exercise per Session:   Stress:   . Feeling of Stress :   Social Connections:   . Frequency of Communication with Friends and Family:   . Frequency of Social Gatherings with Friends and Family:   . Attends Religious Services:   . Active Member of Clubs or Organizations:   . Attends Banker Meetings:   Marland Kitchen Marital Status:      Family History: The patient's family history includes Cancer in his mother; Diabetes in his father and mother; Heart attack in his father; Hyperlipidemia in his father; Hypertension in his father. ROS:   Please see the history of present illness.    All other systems reviewed and are negative.  EKGs/Labs/Other Studies Reviewed:    The following studies were reviewed today:    Recent Labs: 01/28/2020: TSH 1.784 01/31/2020: ALT 17 02/03/2020: Hemoglobin 16.3; Platelets 196 02/05/2020: BUN 13; Creatinine, Ser 0.99; Potassium 3.7; Sodium 137  Recent Lipid Panel    Component Value Date/Time   CHOL 206 (H) 01/28/2020 0612   TRIG 154 (H) 01/28/2020 0612   HDL 31 (L) 01/28/2020 0612    CHOLHDL 6.6 01/28/2020 0612   VLDL 31 01/28/2020 0612   LDLCALC 144 (H) 01/28/2020 0612    Physical Exam:    VS:  BP 136/78   Pulse 78   Ht 6' (1.829 m)   Wt 237 lb 6.4 oz (107.7 kg)   SpO2 96%   BMI 32.20 kg/m     Wt Readings from Last 3 Encounters:  04/21/20 237 lb 6.4 oz (107.7 kg)  03/11/20 231 lb 9.6 oz (105.1 kg)  03/10/20 230 lb 12.8 oz (104.7 kg)     GEN:  Well nourished, well developed in no acute distress HEENT: Normal he has xanthelasma NECK: No JVD; No carotid bruits LYMPHATICS: No lymphadenopathy CARDIAC: RRR, no murmurs, rubs, gallops RESPIRATORY:  Clear to auscultation without rales, wheezing or rhonchi  ABDOMEN: Soft, non-tender, non-distended MUSCULOSKELETAL:  No edema; No deformity  SKIN: Warm and dry NEUROLOGIC:  Alert and oriented x 3 PSYCHIATRIC:  Normal affect    Signed, Norman Herrlich, MD  04/21/2020 10:22 AM    [facility]

## 2020-04-21 ENCOUNTER — Other Ambulatory Visit: Payer: Self-pay

## 2020-04-21 ENCOUNTER — Telehealth: Payer: Self-pay

## 2020-04-21 ENCOUNTER — Encounter: Payer: Self-pay | Admitting: Cardiology

## 2020-04-21 ENCOUNTER — Ambulatory Visit (INDEPENDENT_AMBULATORY_CARE_PROVIDER_SITE_OTHER): Payer: Self-pay | Admitting: Cardiology

## 2020-04-21 VITALS — BP 136/78 | HR 78 | Ht 72.0 in | Wt 237.4 lb

## 2020-04-21 DIAGNOSIS — I639 Cerebral infarction, unspecified: Secondary | ICD-10-CM

## 2020-04-21 DIAGNOSIS — I10 Essential (primary) hypertension: Secondary | ICD-10-CM

## 2020-04-21 DIAGNOSIS — E782 Mixed hyperlipidemia: Secondary | ICD-10-CM

## 2020-04-21 LAB — LIPID PANEL
Chol/HDL Ratio: 3.2 ratio (ref 0.0–5.0)
Cholesterol, Total: 125 mg/dL (ref 100–199)
HDL: 39 mg/dL — ABNORMAL LOW (ref >39)
LDL Chol Calc (NIH): 58 mg/dL (ref 0–99)
Triglycerides: 162 mg/dL — ABNORMAL HIGH (ref 0–149)
VLDL Cholesterol Cal: 28 mg/dL (ref 5–40)

## 2020-04-21 NOTE — Patient Instructions (Signed)
Medication Instructions:  Your physician recommends that you continue on your current medications as directed. Please refer to the Current Medication list given to you today.  *If you need a refill on your cardiac medications before your next appointment, please call your pharmacy*   Lab Work: Your physician recommends that you return for lab work in: TODAY Lipids If you have labs (blood work) drawn today and your tests are completely normal, you will receive your results only by:  MyChart Message (if you have MyChart) OR  A paper copy in the mail If you have any lab test that is abnormal or we need to change your treatment, we will call you to review the results.   Testing/Procedures: None   Follow-Up: At [facility], you and your health needs are our priority.  As part of our continuing mission to provide you with exceptional heart care, we have created designated Provider Care Teams.  These Care Teams include your primary Cardiologist (physician) and Advanced Practice Providers (APPs -  Physician Assistants and Nurse Practitioners) who all work together to provide you with the care you need, when you need it.  We recommend signing up for the patient portal called "MyChart".  Sign up information is provided on this After Visit Summary.  MyChart is used to connect with patients for Virtual Visits (Telemedicine).  Patients are able to view lab/test results, encounter notes, upcoming appointments, etc.  Non-urgent messages can be sent to your provider as well.   To learn more about what you can do with MyChart, go to [email or web address].    Your next appointment:   3 month(s)  The format for your next appointment:   In Person  Provider:   Norman Herrlich, MD   Other Instructions We have put in a referral for you to see an Electrophysiology Dr. In [place]. They will call you to schedule this appointment.

## 2020-04-21 NOTE — Telephone Encounter (Signed)
Spoke with patient regarding results and recommendation.  Patient verbalizes understanding and is agreeable to plan of care. Advised patient to call back with any issues or concerns.  

## 2020-04-21 NOTE — Telephone Encounter (Signed)
-----   Message from Baldo Daub, MD sent at 04/21/2020  4:49 PM EDT ----- Normal or stable result  Result continue with statin

## 2020-04-23 ENCOUNTER — Telehealth: Payer: Self-pay | Admitting: *Deleted

## 2020-04-23 NOTE — Telephone Encounter (Signed)
-----   Message from Elyse Hsu sent at 04/22/2020  9:46 AM EDT ----- Pt scheduled with Dr. Elberta Fortis on 05/26/20 for loop implant.   Pt is self pay-did not send message to pre cert. Pt would like call to discuss pricing of procedure. Did inform pt of cone discount.   Thanks,  [employer]

## 2020-04-23 NOTE — Telephone Encounter (Signed)
Pt made aware that in-office ILR implant cost about $20,000 and self-pay would get 55% discount off of that. Pt states that he cannot afford that and does not want the loop recorder. 7/13 OV w/ Camnitz cancelled. Advised if circumstance changes and he would like to re-address implant then can reschedule. Pt aware I will make Dr. Dulce Sellar aware.

## 2020-05-26 ENCOUNTER — Institutional Professional Consult (permissible substitution): Payer: Self-pay | Admitting: Cardiology

## 2020-05-27 ENCOUNTER — Encounter: Payer: Self-pay | Admitting: Family Medicine

## 2020-05-27 ENCOUNTER — Other Ambulatory Visit: Payer: Self-pay

## 2020-05-27 ENCOUNTER — Ambulatory Visit: Payer: Self-pay | Attending: Family Medicine | Admitting: Family Medicine

## 2020-05-27 VITALS — BP 133/91 | HR 71 | Ht 72.0 in | Wt 240.2 lb

## 2020-05-27 DIAGNOSIS — G629 Polyneuropathy, unspecified: Secondary | ICD-10-CM

## 2020-05-27 DIAGNOSIS — E66811 Obesity, class 1: Secondary | ICD-10-CM

## 2020-05-27 DIAGNOSIS — Z131 Encounter for screening for diabetes mellitus: Secondary | ICD-10-CM

## 2020-05-27 DIAGNOSIS — Z87891 Personal history of nicotine dependence: Secondary | ICD-10-CM

## 2020-05-27 DIAGNOSIS — E669 Obesity, unspecified: Secondary | ICD-10-CM

## 2020-05-27 DIAGNOSIS — Z1159 Encounter for screening for other viral diseases: Secondary | ICD-10-CM

## 2020-05-27 DIAGNOSIS — I10 Essential (primary) hypertension: Secondary | ICD-10-CM

## 2020-05-27 DIAGNOSIS — M79602 Pain in left arm: Secondary | ICD-10-CM

## 2020-05-27 DIAGNOSIS — Z8673 Personal history of transient ischemic attack (TIA), and cerebral infarction without residual deficits: Secondary | ICD-10-CM

## 2020-05-27 DIAGNOSIS — I739 Peripheral vascular disease, unspecified: Secondary | ICD-10-CM

## 2020-05-27 LAB — POCT GLYCOSYLATED HEMOGLOBIN (HGB A1C): HbA1c, POC (controlled diabetic range): 5.6 % (ref 0.0–7.0)

## 2020-05-27 MED ORDER — AMLODIPINE BESYLATE 2.5 MG PO TABS: 2.5000 mg | ORAL_TABLET | Freq: Every day | ORAL | 6 refills | Status: DC

## 2020-05-27 MED ORDER — ATORVASTATIN CALCIUM 40 MG PO TABS: 40.0000 mg | ORAL_TABLET | Freq: Every day | ORAL | 6 refills | Status: DC

## 2020-05-27 MED ORDER — BUPROPION HCL ER (XL) 150 MG PO TB24: 150.0000 mg | ORAL_TABLET | Freq: Every day | ORAL | 3 refills | Status: DC

## 2020-05-27 MED ORDER — GABAPENTIN 300 MG PO CAPS: 300.0000 mg | ORAL_CAPSULE | Freq: Every day | ORAL | 6 refills | Status: DC

## 2020-05-27 NOTE — Progress Notes (Signed)
Sates that when he uses his left arm it hurts at times.  Wants to quit smoking.

## 2020-05-27 NOTE — Patient Instructions (Signed)

## 2020-05-27 NOTE — Progress Notes (Signed)
Subjective:  Patient ID: Cody Baird, male    DOB: 01-15-1961  Age: 59 y.o. MRN: [id number]  CC: Heat Exposure   HPI Cody Baird  is a 59 year old male with a previous history of tobacco abuse, PAD s/p R LE bypass graft diagnosed with right MCA stroke in 01/2020.  He presented outside the window for TPA. Echocardiogram revealed EF of 60 to 65%, no regional wall motion abnormality, TEE negative for patent foreman ovale but revealed small insignificant pulmonary shunt.  Had a follow-up visit with [facility] on 03/03/2020 and they recommended evaluation for possible sleep apnea, referral placed to sleep center. Seen by cardiology on 04/2020 and 14 day Z- monitor revealed sinus rhythm, rare ventricular and supraventricular ectopy. Plan includes setting him up with the [facility] office for possible loop recorder. Patient states he will not be going to evaluated for the loop recorder as it wil cost $10,000.  His L arm hurts when he uses it but at rest he has no problem. Pain is also present at night but he states it is mild. He would like assistance ; he quit smoking 4 months ago but has cravings for tobacco. He has numbness in his R foot; he is s/p RLE CABG in the past Past Medical History:  Diagnosis Date   Acute right MCA stroke (HCC) 01/30/2020   Aftercare following surgery of the circulatory system, NEC 01/14/2014   Arthritis    low back and left hip   Atherosclerosis of native arteries of the extremities with ulceration(440.23) 07/16/2013   Atherosclerotic peripheral vascular disease with rest pain (HCC) 10/30/2013   B12 deficiency    Bradycardia    Chronic back pain    CVA (cerebral vascular accident) (HCC) 01/28/2020   Essential hypertension 01/28/2020   Headache(784.0)    occasionally and sinus related    History of bronchitis    last time about 2-47yrs ago   Ischemic pain of foot 12/24/2013   PAD (peripheral artery disease) (HCC)    Pain in limb  06/25/2013   PBA (pseudobulbar affect)    Peripheral vascular disease (HCC)    Peripheral vascular disease, unspecified (HCC) 06/25/2013   Post-op pain 09/17/2013   Post-operative pain 07/16/2013   Prolapsed, anus    hx of   PVD (peripheral vascular disease) (HCC) 09/17/2013   Stroke (HCC) 01/2020   TIA (transient ischemic attack) 01/28/2020   Tobacco abuse 01/28/2020   Venous stasis ulcer of ankle, right (HCC) 02/17/2020    Past Surgical History:  Procedure Laterality Date   ABDOMINAL AORTAGRAM N/A 06/26/2013   Procedure: ABDOMINAL AORTAGRAM;  Surgeon: Larina Earthly, MD;  Location: [facility] CATH LAB;  Service: Cardiovascular;  Laterality: N/A;   AMPUTATION Right 12/04/2013   Procedure: AMPUTATION DIGIT- 3RD TOE RIGHT FOOT ;  Surgeon: Larina Earthly, MD;  Location: [facility] OR;  Service: Vascular;  Laterality: Right;   AMPUTATION Right 01/15/2014   Procedure: AMPUTATION OF RIGHT 4TH and 5TH TOES;  Surgeon: Larina Earthly, MD;  Location: [facility] OR;  Service: Vascular;  Laterality: Right;   BACK SURGERY     L1-2   BACK SURGERY     L1-2   BACK SURGERY     thinks L1- 2   COLON SURGERY     removal of colon due to proplase rectum   COLOSTOMY     COLOSTOMY CLOSURE  2006ish   ESOPHAGOGASTRODUODENOSCOPY     FEMORAL-TIBIAL BYPASS GRAFT Right 06/28/2013   Procedure: BYPASS GRAFT FEMORAL  TO TIBIAL PERONEAL TRUNK ARTERY;  Surgeon: Larina Earthly, MD;  Location: [facility] OR;  Service: Vascular;  Laterality: Right;    Family History  Problem Relation Age of Onset   Cancer Mother    Diabetes Mother    Diabetes Father    Hyperlipidemia Father    Hypertension Father    Heart attack Father     Allergies  Allergen Reactions   Penicillins Other (See Comments)    Childhood allergy not sure what the reaction is  Did it involve swelling of the face/tongue/throat, SOB, or low BP? Unknown Did it involve sudden or severe rash/hives, skin peeling, or any reaction on the inside of your mouth or nose?  Unknown Did you need to seek medical attention at a hospital or doctor's office? Unknown When did it last happen?childhood If all above answers are NO, may proceed with cephalosporin use.     Outpatient Medications Prior to Visit  Medication Sig Dispense Refill   aspirin 325 MG EC tablet Take 1 tablet (325 mg total) by mouth daily. 100 tablet 1   atorvastatin (LIPITOR) 40 MG tablet Take 1 tablet (40 mg total) by mouth daily at 6 PM. 30 tablet 6   cyanocobalamin 1000 MCG tablet Take 1 tablet (1,000 mcg total) by mouth daily. (Patient not taking: Reported on 05/27/2020) 30 tablet 2   No facility-administered medications prior to visit.     ROS Review of Systems  Constitutional: Negative for activity change and appetite change.  HENT: Negative for sinus pressure and sore throat.   Eyes: Negative for visual disturbance.  Respiratory: Negative for cough, chest tightness and shortness of breath.   Cardiovascular: Negative for chest pain and leg swelling.  Gastrointestinal: Negative for abdominal distention, abdominal pain, constipation and diarrhea.  Endocrine: Negative.   Genitourinary: Negative for dysuria.  Musculoskeletal:       See HPI  Skin: Negative for rash.  Allergic/Immunologic: Negative.   Neurological: Positive for numbness. Negative for weakness and light-headedness.  Psychiatric/Behavioral: Negative for dysphoric mood and suicidal ideas.    Objective:  BP (!) 133/91    Pulse 71    Ht 6' (1.829 m)    Wt 240 lb 3.2 oz (109 kg)    SpO2 97%    BMI 32.58 kg/m   BP/Weight 05/27/2020 04/21/2020 03/11/2020  Systolic BP 133 136 134  Diastolic BP 91 78 86  Wt. (Lbs) 240.2 237.4 231.6  BMI 32.58 32.2 31.41      Physical Exam Constitutional:      Appearance: He is well-developed.  Neck:     Vascular: No JVD.  Cardiovascular:     Rate and Rhythm: Normal rate.     Pulses:          Dorsalis pedis pulses are 0 on the right side and 1+ on the left side.     Heart  sounds: Normal heart sounds. No murmur heard.   Pulmonary:     Effort: Pulmonary effort is normal.     Breath sounds: Normal breath sounds. No wheezing or rales.  Chest:     Chest wall: No tenderness.  Abdominal:     General: Bowel sounds are normal. There is no distension.     Palpations: Abdomen is soft. There is no mass.     Tenderness: There is no abdominal tenderness.  Musculoskeletal:        General: Normal range of motion.     Right lower leg: No edema.     Left  lower leg: No edema.     Comments: Amputation of lateral 3 right toes  Skin:    Comments: Erythema of dorsum of right foot   Neurological:     Mental Status: He is alert and oriented to person, place, and time.  Psychiatric:        Mood and Affect: Mood normal.     CMP Latest Ref Rng & Units 02/05/2020 02/03/2020 01/31/2020  Glucose 70 - 99 mg/dL 476(L) 465(K) 99  BUN 6 - 20 mg/dL 13 12 13   Creatinine 0.61 - 1.24 mg/dL 3.54 6.56  Sodium 135 - 145 mmol/L 137 138 138  Potassium 3.5 - 5.1 mmol/L 3.7 3.6 3.4(L)  Chloride 98 - 111 mmol/L 102 102 105  CO2 22 - 32 mmol/L 25 26 23   Calcium 8.9 - 10.3 mg/dL 9.5 9.1 8.12)  Total Protein 6.5 - 8.1 g/dL - - 6.6  Total Bilirubin 0.3 - 1.2 mg/dL - - 0.9  Alkaline Phos 38 - 126 U/L - - 51  AST 15 - 41 U/L - - 19  ALT 0 - 44 U/L - - 17    Lipid Panel     Component Value Date/Time   CHOL 125 04/21/2020 1030   TRIG 162 (H) 04/21/2020 1030   HDL 39 (L) 04/21/2020 1030   CHOLHDL 3.2 04/21/2020 1030   CHOLHDL 6.6 01/28/2020 0612   VLDL 31 01/28/2020 0612   LDLCALC 58 04/21/2020 1030    CBC    Component Value Date/Time   WBC 7.7 02/03/2020 0559   RBC 5.36 02/03/2020 0559   HGB 16.3 02/03/2020 0559   HCT 49.9 02/03/2020 0559   PLT 196 02/03/2020 0559   MCV 93.1 02/03/2020 0559   MCH 30.4 02/03/2020 0559   MCHC 32.7 02/03/2020 0559   RDW 12.9 02/03/2020 0559   LYMPHSABS 2.0 01/31/2020 0504   MONOABS 0.9 01/31/2020 0504   EOSABS 0.3 01/31/2020 0504    BASOSABS 0.0 01/31/2020 0504    Lab Results  Component Value Date   HGBA1C 5.6 05/27/2020    Assessment & Plan:  . 1. Left arm pain Likely residual from CVA Unable to afford PT due to lack of insurance Continue home exercises - atorvastatin (LIPITOR) 40 MG tablet; Take 1 tablet (40 mg total) by mouth daily at 6 PM.  Dispense: 30 tablet; Refill: 6  2. Peripheral vascular disease, unspecified (HCC) Status post right lower extremity bypass graft Risk factor modification Commended on smoking cessation - atorvastatin (LIPITOR) 40 MG tablet; Take 1 tablet (40 mg total) by mouth daily at 6 PM.  Dispense: 30 tablet; Refill: 6  3. Neuropathy Screening for diabetes is negative with A1c of 5.6 Commence gabapentin - gabapentin (NEURONTIN) 300 MG capsule; Take 1 capsule (300 mg total) by mouth at bedtime.  Dispense: 30 capsule; Refill: 6  4. Obesity (BMI 30.0-34.9) Discussed reducing portion sizes, increasing physical activity  5. History of stroke We will check B12 level due to neuropathy Risk factor modification, high intensity statin - Vitamin B12  6. Essential hypertension Slightly above goal Initiate low-dose amlodipine Counseled on blood pressure goal of less than 130/80, low-sodium, DASH diet, medication compliance, 150 minutes of moderate intensity exercise per week. Discussed medication compliance, adverse effects. - amLODipine (NORVASC) 2.5 MG tablet; Take 1 tablet (2.5 mg total) by mouth daily.  Dispense: 30 tablet; Refill: 6  7. Need for hepatitis C screening test  - HCV RNA quant rflx ultra or genotyp([facility]/Sunquest)  8. Screening for diabetes mellitus  -  POCT glycosylated hemoglobin (Hb A1C)  9. History of smoking - buPROPion (WELLBUTRIN XL) 150 MG 24 hr tablet; Take 1 tablet (150 mg total) by mouth daily. For smoking cessationtion  Dispense: 30 tablet; Refill: 3  Meds ordered this encounter  Medications   buPROPion (WELLBUTRIN XL) 150 MG 24 hr tablet     Sig: Take 1 tablet (150 mg total) by mouth daily. For smoking cessationtion    Dispense:  30 tablet    Refill:  3   amLODipine (NORVASC) 2.5 MG tablet    Sig: Take 1 tablet (2.5 mg total) by mouth daily.    Dispense:  30 tablet    Refill:  6   gabapentin (NEURONTIN) 300 MG capsule    Sig: Take 1 capsule (300 mg total) by mouth at bedtime.    Dispense:  30 capsule    Refill:  6   atorvastatin (LIPITOR) 40 MG tablet    Sig: Take 1 tablet (40 mg total) by mouth daily at 6 PM.    Dispense:  30 tablet    Refill:  6    Follow-up: Return in about 6 months (around 11/27/2020) for chronic disease management.       Hoy RegisterEnobong Corleone Biegler, MD, FAAFP. [facility] [place], [place] [phone]   05/27/2020, 11:10 AM

## 2020-05-28 LAB — VITAMIN B12: Vitamin B-12: 706 pg/mL (ref 232–1245)

## 2020-05-28 LAB — HCV RNA QUANT RFLX ULTRA OR GENOTYP: HCV Quant Baseline: NOT DETECTED IU/mL

## 2020-05-29 ENCOUNTER — Telehealth: Payer: Self-pay

## 2020-05-29 NOTE — Telephone Encounter (Signed)
-----   Message from Hoy Register, MD sent at 05/28/2020  5:19 PM EDT ----- Please inform the patient that labs are normal. Thank you.

## 2020-05-29 NOTE — Telephone Encounter (Signed)
Patient name and DOB has been verified Patient was informed of lab results. Patient had no questions.  

## 2020-07-13 ENCOUNTER — Other Ambulatory Visit: Payer: Self-pay

## 2020-07-13 ENCOUNTER — Encounter: Payer: Self-pay | Admitting: Adult Health

## 2020-07-13 ENCOUNTER — Ambulatory Visit: Payer: Self-pay | Admitting: Adult Health

## 2020-07-13 VITALS — BP 140/92 | HR 75 | Ht 72.0 in | Wt 244.0 lb

## 2020-07-13 DIAGNOSIS — I63511 Cerebral infarction due to unspecified occlusion or stenosis of right middle cerebral artery: Secondary | ICD-10-CM

## 2020-07-13 DIAGNOSIS — Z87891 Personal history of nicotine dependence: Secondary | ICD-10-CM

## 2020-07-13 DIAGNOSIS — E785 Hyperlipidemia, unspecified: Secondary | ICD-10-CM

## 2020-07-13 DIAGNOSIS — I10 Essential (primary) hypertension: Secondary | ICD-10-CM

## 2020-07-13 NOTE — Progress Notes (Signed)
[facility] [address] [place]. [place] [address] [phone]       STROKE FOLLOW UP NOTE  [name] Date of Birth:  1961-08-24 Medical Record Number:  [id number]   Reason for Referral: stroke follow up    SUBJECTIVE:   CHIEF COMPLAINT:  Chief Complaint  Patient presents with  . Follow-up    tx rm  . Cerebrovascular Accident    Pt said he has no new sx but he feels hes in bubble ans weakness in the left arm.    HPI:   Today, 07/13/2020, [name] returns for stroke follow-up  Residual deficit of mild LUE weakness and occasional heaviness sensation which has been stable without worsening He does report occasional "odd sensation" where he feels as though he is floating but denies any associated symptoms such as visual changes, dizziness, vertigo, imbalance, headache, weakness or syncopal type symptoms.  Denies new or worsening stroke/TIA symptoms  14-day cardiac monitor negative for atrial fibrillation Recommended loop recorder placement but unfortunately due to cost, he declines placement He is currently uninsured and apparently did not qualify for [facility] financial assistance program  Remains on aspirin 81 mg daily without bleeding or bruising Continues on atorvastatin 40 mg daily without myalgias Blood pressure today 140/92 -does not routinely monitor at home  No further concerns at this time     History provided for reference purposes only Hospital follow-up 03/11/2020 JM: [name] is being seen for hospital follow-up.  He has recovered well since discharge with improvement of motor strength and ambulation and only complaints are occasional numbness/tingling left hand/wrist and slower cognition with increased fatigue after exertion.  He does endorse ongoing improvement.  He did not participate in any additional therapies after CIR due to lack of insurance coverage.  He has completely quit smoking since discharge.  He does report change in  dietary habits and is aware of need for weight loss.  Continues on DAPT without bleeding or bruising.  Continues on atorvastatin without myalgias.  Blood pressure today 134/86. Evaluated by cardiology yesterday who ordered 14-day cardiac monitor as recommended at discharge to assess for possible atrial fibrillation.  No concerns at this time.  Stroke admission 01/27/2020: [name] is a 59 y.o. male with history of PVD s/p fem pop with multiple toe amputations, heavy smoker and temporary colostomy for prolapsed rectum who presented on 01/27/2020 with 1-day hx L sided weakness and slurred speech.  Stroke work-up revealed scattered right MCA territory infarct, embolic pattern, etiology uncertain concerning for large vessel source with evidence of moderate distal right M1 stenosis.  Also recommended 30-day cardiac event monitor to rule out atrial fibrillation.  Hypercoagulable labs negative.  UDS positive for THC.  Recommended DAPT for 3 months then aspirin alone given high-grade intracranial stenosis per Dr. Roda Shutters.  History of HTN stable.  LDL 144 initiate atorvastatin 40 mg daily.  Other stroke risk factors include B12 deficiency, heavy tobacco use approx 3pack/day for 41 years and PVD s/p fem pop with no prior history of stroke.  Residual deficits of mild left facial droop and left hemiparesis and discharged to CIR for ongoing therapy needs.  He was eventually discharged home on 02/06/2020.  Stroke: scattered R MCA territory infarct, embolic pattern, etiology uncertain, concerning for large vessel source  Code Stroke CT head No acute abnormality.   CTA head no LVO. IC atherosclerosis: moderate distal R M1, high-grade L A3-4, R VA stenosis  CTA neck mixed plaque proximal  L ICA. pulm emphysema  MRI  Multifocal R MCA territory infarcts   LE Doppler  No DVT  2D Echo EF 60-65%. No source of embolus   Pt refused TEE  TCD bubble study negative for PFO, but possible insignificant small pulmonary  shunt  Will consider 30 day cardiac event monitoring as outpt to rule out afib   Hypercoagulable labs neg   UDS positive for THC  LDL 144  HgbA1c 5.7  Lovenox 40 mg sq daily for VTE prophylaxis  No antithrombotic prior to admission, now on aspirin 325 mg daily and clopidogrel 75 mg daily. Given high-grade intracranial stenosis, continue DAPT x 3 months then aspirin alone.  Therapy recommendations:  CIR        ROS:   14 system review of systems performed and negative with exception of weakness  PMH:  Past Medical History:  Diagnosis Date  . Acute right MCA stroke (HCC) 01/30/2020  . Aftercare following surgery of the circulatory system, NEC 01/14/2014  . Arthritis    low back and left hip  . 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 Atherosclerotic peripheral vascular disease with rest pain (HCC) 10/30/2013  . B12 deficiency   . Bradycardia   . Chronic back pain   . CVA (cerebral vascular accident) (HCC) 01/28/2020  . Essential hypertension 01/28/2020  . Headache(784.0)    occasionally and sinus related   . History of bronchitis    last time about 2-35yrs ago  . Ischemic pain of foot 12/24/2013  . PAD (peripheral artery disease) (HCC)   . Pain in limb 06/25/2013  . PBA (pseudobulbar affect)   . Peripheral vascular disease (HCC)   . Peripheral vascular disease, unspecified (HCC) 06/25/2013  . Post-op pain 09/17/2013  . Post-operative pain 07/16/2013  . Prolapsed, anus    hx of  . PVD (peripheral vascular disease) (HCC) 09/17/2013  . Stroke (HCC) 01/2020  . TIA (transient ischemic attack) 01/28/2020  . Tobacco abuse 01/28/2020  . Venous stasis ulcer of ankle, right (HCC) 02/17/2020    PSH:  Past Surgical History:  Procedure Laterality Date  . ABDOMINAL AORTAGRAM N/A 06/26/2013   Procedure: ABDOMINAL AORTAGRAM;  Surgeon: Larina Earthly, MD;  Location: [facility] CATH LAB;  Service: Cardiovascular;  Laterality: N/A;  . AMPUTATION Right  12/04/2013   Procedure: AMPUTATION DIGIT- 3RD TOE RIGHT FOOT ;  Surgeon: Larina Earthly, MD;  Location: [facility] OR;  Service: Vascular;  Laterality: Right;  . AMPUTATION Right 01/15/2014   Procedure: AMPUTATION OF RIGHT 4TH and 5TH TOES;  Surgeon: Larina Earthly, MD;  Location: [facility] OR;  Service: Vascular;  Laterality: Right;  . BACK SURGERY     L1-2  . BACK SURGERY     L1-2  . BACK SURGERY     thinks L1- 2  . COLON SURGERY     removal of colon due to proplase rectum  . COLOSTOMY    . COLOSTOMY CLOSURE  2006ish  . ESOPHAGOGASTRODUODENOSCOPY    . FEMORAL-TIBIAL BYPASS GRAFT Right 06/28/2013   Procedure: BYPASS GRAFT FEMORAL TO TIBIAL PERONEAL TRUNK ARTERY;  Surgeon: Larina Earthly, MD;  Location: [facility] OR;  Service: Vascular;  Laterality: Right;    Social History:  Social History   Socioeconomic History  . Marital status: Divorced    Spouse name: Not on file  . Number of children: Not on file  . Years of education: Not on file  . Highest education level: Not on file  Occupational History  . Not on file  Tobacco Use  . Smoking status: Former Smoker    Packs/day: 0.50    Years: 36.00    Pack years: 18.00    Types: Cigarettes    Quit date: 01/27/2020    Years since quitting: 0.4  . Smokeless tobacco: Never Used  Substance and Sexual Activity  . Alcohol use: No    Alcohol/week: 0.0 standard drinks  . Drug use: No  . Sexual activity: Not on file  Other Topics Concern  . Not on file  Social History Narrative  . Not on file   Social Determinants of Health   Financial Resource Strain:   . Difficulty of Paying Living Expenses: Not on file  Food Insecurity:   . Worried About Programme researcher, broadcasting/film/video in the Last Year: Not on file  . Ran Out of Food in the Last Year: Not on file  Transportation Needs:   . Lack of Transportation (Medical): Not on file  . Lack of Transportation (Non-Medical): Not on file  Physical Activity:   . Days of Exercise per Week: Not on file  . Minutes of Exercise per  Session: Not on file  Stress:   . Feeling of Stress : Not on file  Social Connections:   . Frequency of Communication with Friends and Family: Not on file  . Frequency of Social Gatherings with Friends and Family: Not on file  . Attends Religious Services: Not on file  . Active Member of Clubs or Organizations: Not on file  . Attends Banker Meetings: Not on file  . Marital Status: Not on file  Intimate Partner Violence:   . Fear of Current or Ex-Partner: Not on file  . Emotionally Abused: Not on file  . Physically Abused: Not on file  . Sexually Abused: Not on file    Family History:  Family History  Problem Relation Age of Onset  . Cancer Mother   . Diabetes Mother   . Diabetes Father   . Hyperlipidemia Father   . Hypertension Father   . Heart attack Father     Medications:   Current Outpatient Medications on File Prior to Visit  Medication Sig Dispense Refill  . amLODipine (NORVASC) 2.5 MG tablet Take 1 tablet (2.5 mg total) by mouth daily. 30 tablet 6  . aspirin 325 MG EC tablet Take 1 tablet (325 mg total) by mouth daily. 100 tablet 1  . atorvastatin (LIPITOR) 40 MG tablet Take 1 tablet (40 mg total) by mouth daily at 6 PM. 30 tablet 6   No current facility-administered medications on file prior to visit.    Allergies:   Allergies  Allergen Reactions  . Penicillins Other (See Comments)    Childhood allergy not sure what the reaction is  Did it involve swelling of the face/tongue/throat, SOB, or low BP? Unknown Did it involve sudden or severe rash/hives, skin peeling, or any reaction on the inside of your mouth or nose? Unknown Did you need to seek medical attention at a hospital or doctor's office? Unknown When did it last happen?childhood If all above answers are "NO", may proceed with cephalosporin use.       OBJECTIVE:  Physical Exam  Vitals:   07/13/20 1313  BP: (!) 140/92  Pulse: 75  Weight: 244 lb (110.7 kg)  Height: 6'  (1.829 m)   Body mass index is 33.09 kg/m. No exam data present  General: well developed, well nourished,  pleasant middle-age Caucasian  male, seated, in no evident distress Head: head normocephalic and atraumatic.   Neck: supple with no carotid or supraclavicular bruits Cardiovascular: regular rate and rhythm, no murmurs Musculoskeletal: no deformity Skin:  no rash/petichiae Vascular:  PVD lower extremity skin color changes and multiple toe amputations   Neurologic Exam Mental Status: Awake and fully alert.   Fluent speech and language. Oriented to place and time. Recent and remote memory intact. Attention span, concentration and fund of knowledge appropriate. Mood and affect appropriate.  Cranial Nerves: Pupils equal, briskly reactive to light. Extraocular movements full without nystagmus. Visual fields full to confrontation. Hearing intact. Facial sensation intact. Face, tongue, palate moves normally and symmetrically.  Motor: Normal bulk and tone. Normal strength in all tested extremity muscles. Sensory.: intact to touch , pinprick , position and vibratory sensation.  Coordination: Rapid alternating movements normal in all extremities. Finger-to-nose and heel-to-shin performed showed mild left-sided ataxia. Gait and Station: Arises from chair without difficulty. Stance is normal. Gait demonstrates normal stride length with mild imbalance but able to ambulate without assistive device Reflexes: 1+ and symmetric. Toes downgoing.       ASSESSMENT: Cody Baird is a 59 y.o. year old male presented with 1 day history of left-sided weakness and slurred speech on 01/27/2020 with stroke work-up revealing scattered right MCA territory infarct, embolic pattern secondary to unclear source concerning for large vessel source with moderate distal right M1 stenosis. Vascular risk factors include PVD s/p fem pop, tobacco use, B12 deficiency, high-grade intracranial stenosis, HTN and HLD.        PLAN:  1. Right MCA stroke:  a. Residual deficits: Left-sided ataxia and gait impairment.  Symptoms stable without worsening.  b. "odd sensation" complaints of unknown etiology as no other associated symptoms and patient had difficulty fully describing.  Advised to continue to monitor as well as checking blood pressure during that time.  c. 14-day cardiac event monitor negative for atrial fibrillation.  Due to lack of insurance, he declined further cardiac evaluation with loop recorder placement d. Continue aspirin 325 mg daily  and atorvastatin for secondary stroke prevention.  e. Ensure close PCP follow-up for aggressive stroke risk factor management 2. HTN:  a. BP goal<130/90.   b. Stable.   c. Managed by PCP. 3. HLD:  a. LDL goal<70.  Recent LDL 58.   b. Continue atorvastatin.   c. Lipid panel and prescribing managed by PCP 4. Tobacco use:  a. Encouraged continued cessation for secondary stroke prevention and history of PVD 5. Possible sleep apnea:  a. Discussed at prior visit due to concerns of fatigue and snoring.   b. Declines further evaluation at this time due to lack of insurance   Follow-up in 6 months or call earlier if needed   I spent 30 minutes of face-to-face and non-face-to-face time with patient.  This included previsit chart review, lab review, study review, order entry, electronic health record documentation, patient education regarding recent stroke, residual deficits, importance of managing stroke risk factors and answered all questions to patient satisfaction     Ihor AustinJessica McCue, [facility] Orthopaedic InstituteGNP-BC  [facility] [address] [place], [place] [id number]  Phone [phone] Fax [phone] Note: This document was prepared with digital dictation and possible smart phrase technology. Any transcriptional errors that result from this process are unintentional.

## 2020-07-13 NOTE — Patient Instructions (Signed)
Continue aspirin 81 mg daily  and atorvastatin 40 mg daily for secondary stroke prevention  Continue to follow up with PCP regarding cholesterol and blood pressure management  Maintain strict control of hypertension with blood pressure goal below 130/90 and cholesterol with LDL cholesterol (bad cholesterol) goal below 70 mg/dL.       Followup in the future with me in 6 months or call earlier if needed       Thank you for coming to see us at [facility]. I hope we have been able to provide you high quality care today.  You may receive a patient satisfaction survey over the next few weeks. We would appreciate your feedback and comments so that we may continue to improve ourselves and the health of our patients.  

## 2020-07-14 NOTE — Progress Notes (Signed)
I agree with the above plan 

## 2020-07-31 ENCOUNTER — Ambulatory Visit: Payer: Self-pay | Admitting: Cardiology

## 2020-09-15 DIAGNOSIS — Z8709 Personal history of other diseases of the respiratory system: Secondary | ICD-10-CM | POA: Insufficient documentation

## 2020-09-15 DIAGNOSIS — I739 Peripheral vascular disease, unspecified: Secondary | ICD-10-CM | POA: Insufficient documentation

## 2020-09-15 DIAGNOSIS — G8929 Other chronic pain: Secondary | ICD-10-CM | POA: Insufficient documentation

## 2020-09-15 DIAGNOSIS — M1990 Unspecified osteoarthritis, unspecified site: Secondary | ICD-10-CM | POA: Insufficient documentation

## 2020-09-15 DIAGNOSIS — K622 Anal prolapse: Secondary | ICD-10-CM | POA: Insufficient documentation

## 2020-09-15 DIAGNOSIS — M549 Dorsalgia, unspecified: Secondary | ICD-10-CM | POA: Insufficient documentation

## 2020-09-17 ENCOUNTER — Other Ambulatory Visit: Payer: Self-pay

## 2020-09-17 ENCOUNTER — Encounter: Payer: Self-pay | Admitting: Cardiology

## 2020-09-17 ENCOUNTER — Ambulatory Visit (INDEPENDENT_AMBULATORY_CARE_PROVIDER_SITE_OTHER): Payer: Self-pay | Admitting: Cardiology

## 2020-09-17 VITALS — BP 120/62 | HR 75 | Ht 72.0 in | Wt 249.0 lb

## 2020-09-17 DIAGNOSIS — I10 Essential (primary) hypertension: Secondary | ICD-10-CM

## 2020-09-17 DIAGNOSIS — E782 Mixed hyperlipidemia: Secondary | ICD-10-CM

## 2020-09-17 DIAGNOSIS — I639 Cerebral infarction, unspecified: Secondary | ICD-10-CM

## 2020-09-17 NOTE — Patient Instructions (Addendum)
Medication Instructions:  Your physician recommends that you continue on your current medications as directed. Please refer to the Current Medication list given to you today.  *If you need a refill on your cardiac medications before your next appointment, please call your pharmacy*   Lab Work: None If you have labs (blood work) drawn today and your tests are completely normal, you will receive your results only by: Marland Kitchen MyChart Message (if you have MyChart) OR . A paper copy in the mail If you have any lab test that is abnormal or we need to change your treatment, we will call you to review the results.   Testing/Procedures: None   Follow-Up: At [facility], you and your health needs are our priority.  As part of our continuing mission to provide you with exceptional heart care, we have created designated Provider Care Teams.  These Care Teams include your primary Cardiologist (physician) and Advanced Practice Providers (APPs -  Physician Assistants and Nurse Practitioners) who all work together to provide you with the care you need, when you need it.  We recommend signing up for the patient portal called "MyChart".  Sign up information is provided on this After Visit Summary.  MyChart is used to connect with patients for Virtual Visits (Telemedicine).  Patients are able to view lab/test results, encounter notes, upcoming appointments, etc.  Non-urgent messages can be sent to your provider as well.   To learn more about what you can do with MyChart, go to [email or web address].    Your next appointment:   6 month(s)  The format for your next appointment:   In Person  Provider:   Norman Herrlich, MD   Other Instructions Please record your blood pressure daily and record these readings. Please purchase the I-phone adapter [employer] to scan the your heart rate daily. This can be purchased on [employer].

## 2020-09-17 NOTE — Progress Notes (Signed)
Cardiology Office Note:    Date:  09/17/2020   ID:  Cody Baird, DOB 05-Aug-1961, MRN [id number]  PCP:  Hoy Register, MD  Cardiologist:  Norman Herrlich, MD    Referring MD: Hoy Register, MD    ASSESSMENT:    1. Cerebrovascular accident (CVA), unspecified mechanism (HCC)   2. Essential hypertension   3. Mixed hyperlipidemia    PLAN:    In order of problems listed above:  1. He has made a decision not to have an implanted loop recorder due to financial concerns.  I asked him to screen himself at home for atrial fibrillation with a combination of home blood pressure digital device record heart rate and blood pressure daily and purchase the adapter for the iPhone to record his heart rhythm as needed and daily.  If we detected atrial fibrillation anticoagulation be appropriate.  In the interim continue treatment including high-dose aspirin prescribed by neurology 2. Stable BP at target continue current treatment 3. Continue with statin   Next appointment: 6 months   Medication Adjustments/Labs and Tests Ordered: Current medicines are reviewed at length with the patient today.  Concerns regarding medicines are outlined above.  No orders of the defined types were placed in this encounter.  No orders of the defined types were placed in this encounter.   Chief Complaint  Patient presents with  . Follow-up    After recommendation for an implanted loop recorder    History of Present Illness:    Cody Baird is a 59 y.o. male with a hx of stroke peripheral vascular disease hypertension and hyper lipidemia last seen 04/21/2020.  I referred to EP for an implanted loop recorder and the patient declined due to cost he is having no palpitation I asked him to check home blood pressure and heart rate daily and record and encouraged him purchase the adapter for the iPhone to record heart rhythm daily to screen for atrial fibrillation. Compliance with diet, lifestyle and medications:  Yes Past Medical History:  Diagnosis Date  . Acute right MCA stroke (HCC) 01/30/2020  . Aftercare following surgery of the circulatory system, [place] 01/14/2014  . Arthritis    low back and left hip  . 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 Atherosclerotic peripheral vascular disease with rest pain (HCC) 10/30/2013  . B12 deficiency   . Bradycardia   . Chronic back pain   . CVA (cerebral vascular accident) (HCC) 01/28/2020  . Essential hypertension 01/28/2020  . Headache(784.0)    occasionally and sinus related   . History of bronchitis    last time about 2-55yrs ago  . Ischemic pain of foot 12/24/2013  . PAD (peripheral artery disease) (HCC)   . Pain in limb 06/25/2013  . PBA (pseudobulbar affect)   . Peripheral vascular disease (HCC)   . Peripheral vascular disease, unspecified (HCC) 06/25/2013  . Post-op pain 09/17/2013  . Post-operative pain 07/16/2013  . Prolapsed, anus    hx of  . PVD (peripheral vascular disease) (HCC) 09/17/2013  . Stroke (HCC) 01/2020  . TIA (transient ischemic attack) 01/28/2020  . Tobacco abuse 01/28/2020  . Venous stasis ulcer of ankle, right (HCC) 02/17/2020    Past Surgical History:  Procedure Laterality Date  . ABDOMINAL AORTAGRAM N/A 06/26/2013   Procedure: ABDOMINAL AORTAGRAM;  Surgeon: Larina Earthly, MD;  Location[facility] C[facility] LAB;  Service: Cardiovascular;  Laterality: N/A;  . AMPUTATION Right 12/04/2013   Procedure: AMPUTATION DIGIT- 3RD TOE  RIGHT FOOT ;  Surgeon: Larina Earthly, MD;  Location: [facility] OR;  Service: Vascular;  Laterality: Right;  . AMPUTATION Right 01/15/2014   Procedure: AMPUTATION OF RIGHT 4TH and 5TH TOES;  Surgeon: Larina Earthly, MD;  Location: [facility] OR;  Service: Vascular;  Laterality: Right;  . BACK SURGERY     L1-2  . BACK SURGERY     L1-2  . BACK SURGERY     thinks L1- 2  . COLON SURGERY     removal of colon due to proplase rectum  . COLOSTOMY    . COLOSTOMY CLOSURE  2006ish  .  ESOPHAGOGASTRODUODENOSCOPY    . FEMORAL-TIBIAL BYPASS GRAFT Right 06/28/2013   Procedure: BYPASS GRAFT FEMORAL TO TIBIAL PERONEAL TRUNK ARTERY;  Surgeon: Larina Earthly, MD;  Location: [facility] OR;  Service: Vascular;  Laterality: Right;    Current Medications: Current Meds  Medication Sig  . amLODipine (NORVASC) 2.5 MG tablet Take 1 tablet (2.5 mg total) by mouth daily.  Marland Kitchen aspirin 325 MG EC tablet Take 1 tablet (325 mg total) by mouth daily.  Marland Kitchen atorvastatin (LIPITOR) 40 MG tablet Take 1 tablet (40 mg total) by mouth daily at 6 PM.     Allergies:   Penicillins   Social History   Socioeconomic History  . Marital status: Divorced    Spouse name: Not on file  . Number of children: Not on file  . Years of education: Not on file  . Highest education level: Not on file  Occupational History  . Not on file  Tobacco Use  . Smoking status: Former Smoker    Packs/day: 0.50    Years: 36.00    Pack years: 18.00    Types: Cigarettes    Quit date: 01/27/2020    Years since quitting: 0.6  . Smokeless tobacco: Never Used  Substance and Sexual Activity  . Alcohol use: No    Alcohol/week: 0.0 standard drinks  . Drug use: No  . Sexual activity: Not on file  Other Topics Concern  . Not on file  Social History Narrative  . Not on file   Social Determinants of Health   Financial Resource Strain:   . Difficulty of Paying Living Expenses: Not on file  Food Insecurity:   . Worried About Programme researcher, broadcasting/film/video in the Last Year: Not on file  . Ran Out of Food in the Last Year: Not on file  Transportation Needs:   . Lack of Transportation (Medical): Not on file  . Lack of Transportation (Non-Medical): Not on file  Physical Activity:   . Days of Exercise per Week: Not on file  . Minutes of Exercise per Session: Not on file  Stress:   . Feeling of Stress : Not on file  Social Connections:   . Frequency of Communication with Friends and Family: Not on file  . Frequency of Social Gatherings with  Friends and Family: Not on file  . Attends Religious Services: Not on file  . Active Member of Clubs or Organizations: Not on file  . Attends Banker Meetings: Not on file  . Marital Status: Not on file     Family History: The patient's family history includes Cancer in his mother; Diabetes in his father and mother; Heart attack in his father; Hyperlipidemia in his father; Hypertension in his father. ROS:   Please see the history of present illness.    All other systems reviewed and are negative.  EKGs/Labs/Other Studies Reviewed:  The following studies were reviewed today:    Recent Labs: 01/28/2020: TSH 1.784 01/31/2020: ALT 17 02/03/2020: Hemoglobin 16.3; Platelets 196 02/05/2020: BUN 13; Creatinine, Ser 0.99; Potassium 3.7; Sodium 137  Recent Lipid Panel    Component Value Date/Time   CHOL 125 04/21/2020 1030   TRIG 162 (H) 04/21/2020 1030   HDL 39 (L) 04/21/2020 1030   CHOLHDL 3.2 04/21/2020 1030   CHOLHDL 6.6 01/28/2020 0612   VLDL 31 01/28/2020 0612   LDLCALC 58 04/21/2020 1030    Physical Exam:    VS:  BP 120/62 (BP Location: Right Arm, Patient Position: Sitting, Cuff Size: Normal)   Pulse 75   Ht 6' (1.829 m)   Wt 249 lb (112.9 kg)   SpO2 95%   BMI 33.77 kg/m     Wt Readings from Last 3 Encounters:  09/17/20 249 lb (112.9 kg)  07/13/20 244 lb (110.7 kg)  05/27/20 240 lb 3.2 oz (109 kg)     GEN:  Well nourished, well developed in no acute distress HEENT: Normal NECK: No JVD; No carotid bruits LYMPHATICS: No lymphadenopathy CARDIAC: RRR, no murmurs, rubs, gallops RESPIRATORY:  Clear to auscultation without rales, wheezing or rhonchi  ABDOMEN: Soft, non-tender, non-distended MUSCULOSKELETAL:  No edema; No deformity  SKIN: Warm and dry NEUROLOGIC:  Alert and oriented x 3 PSYCHIATRIC:  Normal affect    Signed, Norman Herrlich, MD  09/17/2020 9:54 AM    [facility]

## 2020-11-17 ENCOUNTER — Ambulatory Visit: Payer: Self-pay

## 2020-11-17 NOTE — Telephone Encounter (Signed)
Patient called stating that he has notice his BP elevated for several weeks  Last was 147 over 90's He states he takes it when he is at his mothers. He is unable to check it now.  He states that he has left shoulder and left arm pain He states that it does not last long. He denies weakness. He denies SOB no other symptoms.  Per protocol patient was advised to go to ER for evaluation of shoulder and arm pain. He then states that he called to cancel his appointment tomorrow because his daughter is unable to find transportation. Again I told him he needed these symptoms checked in the ER. He refuses stating that since his stroke he has always had this pain. He was again told with his hx it would be advised to go to ER for evaluation. He refuesd stating that money was a problem. I ask if UC for evaluation He refused. Appointment for tomorrow was canceled per patient request. Pease advise patient   Reason for Disposition . [1] Systolic BP  >= 160 OR Diastolic >= 100 AND [2] cardiac or neurologic symptoms (e.g., chest pain, difficulty breathing, unsteady gait, blurred vision)  Answer Assessment - Initial Assessment Questions 1. BLOOD PRESSURE: "What is the blood pressure?" "Did you take at least two measurements 5 minutes apart?"     147/90  2. ONSET: "When did you take your blood pressure?"     Over several weeks 3. HOW: "How did you obtain the blood pressure?" (e.g., visiting nurse, automatic home BP monitor)   Home BP machine 4. HISTORY: "Do you have a history of high blood pressure?"     Stroke in march 5. MEDICATIONS: "Are you taking any medications for blood pressure?" "Have you missed any doses recently?"     No  6. OTHER SYMPTOMS: "Do you have any symptoms?" (e.g., headache, chest pain, blurred vision, difficulty breathing, weakness)    Tightness, shoulder pain left 7. PREGNANCY: "Is there any chance you are pregnant?" "When was your last menstrual period?"     N/A  Protocols used: BLOOD  PRESSURE - HIGH-A-AH

## 2020-11-18 ENCOUNTER — Ambulatory Visit: Payer: Self-pay | Admitting: Family Medicine

## 2020-11-18 NOTE — Telephone Encounter (Signed)
Just FYI, pt wants to let you know that his BP has been elevated.

## 2020-11-18 NOTE — Telephone Encounter (Signed)
Please advise to reschedule missed appointment

## 2020-11-18 NOTE — Telephone Encounter (Signed)
Appt has been changed

## 2020-12-08 ENCOUNTER — Other Ambulatory Visit: Payer: Self-pay

## 2020-12-08 ENCOUNTER — Ambulatory Visit: Payer: Self-pay | Attending: Family Medicine | Admitting: Family Medicine

## 2020-12-08 ENCOUNTER — Encounter: Payer: Self-pay | Admitting: Family Medicine

## 2020-12-08 DIAGNOSIS — I739 Peripheral vascular disease, unspecified: Secondary | ICD-10-CM

## 2020-12-08 DIAGNOSIS — I10 Essential (primary) hypertension: Secondary | ICD-10-CM

## 2020-12-08 DIAGNOSIS — M79602 Pain in left arm: Secondary | ICD-10-CM

## 2020-12-08 MED ORDER — AMLODIPINE BESYLATE 5 MG PO TABS: 5.0000 mg | ORAL_TABLET | Freq: Every day | ORAL | 6 refills | Status: DC

## 2020-12-08 MED ORDER — ATORVASTATIN CALCIUM 40 MG PO TABS: 40.0000 mg | ORAL_TABLET | Freq: Every day | ORAL | 6 refills | Status: DC

## 2020-12-08 NOTE — Progress Notes (Signed)
Subjective:  Patient ID: Cody Baird, male    DOB: June 08, 1961  Age: 61 y.o. MRN: [id number]  CC: Hypertension   HPI Cody Baird is a 60 year old male with a previous history of tobacco abuse (quit after 40 years), PAD s/p R LE bypass graft diagnosed with rightMCA stroke in 01/2020.He presented outside the window for TPA. L shoulder and arm have 'been irritating' since his stroke. He states it is worse when he tried to sleep. States symptoms are not terrible and Motrin is beneficial. He is R handed.  His Mom was hospitalized late last year and he has been under some form of stress.  His blood pressure is slightly elevated today.  He denies presence of chest pain no intermittent claudication. He has noticed he gained weight ever since he quit smoking  Past Medical History:  Diagnosis Date  . Acute right MCA stroke ([place]) 01/30/2020  . Aftercare following surgery of the circulatory system, [place] 01/14/2014  . Arthritis    low back and left hip  . 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 Atherosclerotic peripheral vascular disease with rest pain ([place]) 10/30/2013  . B12 deficiency   . Bradycardia   . Chronic back pain   . CVA (cerebral vascular accident) ([place]) 01/28/2020  . Essential hypertension 01/28/2020  . Headache(784.0)    occasionally and sinus related   . History of bronchitis    last time about 2-47yr ago  . Ischemic pain of foot 12/24/2013  . P[place] (peripheral artery disease) ([place]   . Pain in limb 06/25/2013  . PBA (pseudobulbar affect)   . Peripheral vascular disease ([place]   . Peripheral vascular disease, unspecified ([place] 06/25/2013  . Post-op pain 09/17/2013  . Post-operative pain 07/16/2013  . Prolapsed, anus    hx of  . PVD (peripheral vascular disease) ([place] 09/17/2013  . Stroke ([place] 01/2020  . TIA (transient ischemic attack) 01/28/2020  . Tobacco abuse 01/28/2020  . Venous stasis ulcer of ankle, right ([place] 02/17/2020     Past Surgical History:  Procedure Laterality Date  . ABDOMINAL AORTAGRAM N/A 06/26/2013   Procedure: ABDOMINAL AORTAGRAM;  Surgeon: TRosetta Posner MD;  [facility];  Service: Cardiovascular;  Laterality: N/A;  . AMPUTATION Right 12/04/2013   Procedure: AMPUTATION DIGIT- 3RD TOE RIGHT FOOT ;  Surgeon: TRosetta Posner MD;  [place] [facility]  Service: Vascular;  Laterality: Right;  . AMPUTATION Right 01/15/2014   Procedure: AMPUTATION OF RIGHT 4TH and 5TH TOES;  Surgeon: TRosetta Posner MD;  [place]  Service: Vascular;  Laterality: Right;  . BACK SURGERY     L1-2  . BACK SURGERY     L1-2  . BACK SURGERY     thinks L1- 2  . COLON SURGERY     removal of colon due to proplase rectum  . COLOSTOMY    . COLOSTOMY CLOSURE  2006ish  . ESOPHAGOGASTRODUODENOSCOPY    . FEMORAL-TIBIAL BYPASS GRAFT Right 06/28/2013   Procedure: BYPASS GRAFT FEMORAL TO TIBIAL PERONEAL TRUNK ARTERY;  Surgeon: TRosetta Posner MD;  [facility];  Service: Vascular;  Laterality: Right;    Family History  Problem Relation Age of Onset  . Cancer Mother   . Diabetes Mother   . Diabetes Father   . Hyperlipidemia Father   . Hypertension Father   . Heart attack Father     Allergies  Allergen Reactions  . Penicillins Other (See Comments)  Childhood allergy not sure what the reaction is  Did it involve swelling of the face/tongue/throat, SOB, or low BP? Unknown Did it involve sudden or severe rash/hives, skin peeling, or any reaction on the inside of your mouth or nose? Unknown Did you need to seek medical attention at a hospital or doctor's office? Unknown When did it last happen?childhood If all above answers are "NO", may proceed with cephalosporin use.     Outpatient Medications Prior to Visit  Medication Sig Dispense Refill  . aspirin 325 MG EC tablet Take 1 tablet (325 mg total) by mouth daily. 100 tablet 1  . amLODipine (NORVASC) 2.5 MG tablet Take 1 tablet (2.5 mg total) by mouth  daily. 30 tablet 6  . atorvastatin (LIPITOR) 40 MG tablet Take 1 tablet (40 mg total) by mouth daily at 6 PM. 30 tablet 6   No facility-administered medications prior to visit.     ROS Review of Systems  Constitutional: Negative for activity change and appetite change.  HENT: Negative for sinus pressure and sore throat.   Eyes: Negative for visual disturbance.  Respiratory: Negative for cough, chest tightness and shortness of breath.   Cardiovascular: Negative for chest pain and leg swelling.  Gastrointestinal: Negative for abdominal distention, abdominal pain, constipation and diarrhea.  Endocrine: Negative.   Genitourinary: Negative for dysuria.  Musculoskeletal: Negative for joint swelling and myalgias.  Skin: Negative for rash.  Allergic/Immunologic: Negative.   Neurological: Negative for weakness, light-headedness and numbness.  Psychiatric/Behavioral: Negative for dysphoric mood and suicidal ideas.    Objective:  BP (!) 145/80   Pulse 74   Ht 6' (1.829 m)   Wt 253 lb 9.6 oz (115 kg)   SpO2 98%   BMI 34.39 kg/m   BP/Weight 12/08/2020 09/17/2020 0/98/1191  Systolic BP 478 295 621  Diastolic BP 80 62 92  Wt. (Lbs) 253.6 249 244  BMI 34.39 33.77 33.09      Physical Exam Constitutional:      Appearance: He is well-developed.  Neck:     Vascular: No JVD.  Cardiovascular:     Rate and Rhythm: Normal rate.     Heart sounds: Normal heart sounds. No murmur heard.   Pulmonary:     Effort: Pulmonary effort is normal.     Breath sounds: Normal breath sounds. No wheezing or rales.  Chest:     Chest wall: No tenderness.  Abdominal:     General: Bowel sounds are normal. There is no distension.     Palpations: Abdomen is soft. There is no mass.     Tenderness: There is no abdominal tenderness.  Musculoskeletal:        General: Normal range of motion.     Right lower leg: No edema.     Left lower leg: No edema.     Comments: No tenderness on palpation of left  shoulder Normal handgrip bilaterally  Neurological:     Mental Status: He is alert and oriented to person, place, and time.     Comments: Normal sensation in both upper extremities  Psychiatric:        Mood and Affect: Mood normal.     CMP Latest Ref Rng & Units 02/05/2020 02/03/2020 01/31/2020  Glucose 70 - 99 mg/dL 109(H) 102(H) 99  BUN 6 - 20 mg/dL '13 12 13  ' Creatinine 0.61 - 1.24 mg/dL 0.99 0.93 0.94  Sodium 135 - 145 mmol/L 137 138 138  Potassium 3.5 - 5.1 mmol/L 3.7 3.6 3.4(L)  Chloride  98 - 111 mmol/L 102 102 105  CO2 22 - 32 mmol/L '25 26 23  ' Calcium 8.9 - 10.3 mg/dL 9.5 9.1 8.8(L)  Total Protein 6.5 - 8.1 g/dL - - 6.6  Total Bilirubin 0.3 - 1.2 mg/dL - - 0.9  Alkaline Phos 38 - 126 U/L - - 51  AST 15 - 41 U/L - - 19  ALT 0 - 44 U/L - - 17    Lipid Panel     Component Value Date/Time   CHOL 125 04/21/2020 1030   TRIG 162 (H) 04/21/2020 1030   HDL 39 (L) 04/21/2020 1030   CHOLHDL 3.2 04/21/2020 1030   CHOLHDL 6.6 01/28/2020 0612   VLDL 31 01/28/2020 0612   LDLCALC 58 04/21/2020 1030    CBC    Component Value Date/Time   WBC 7.7 02/03/2020 0559   RBC 5.36 02/03/2020 0559   HGB 16.3 02/03/2020 0559   HCT 49.9 02/03/2020 0559   PLT 196 02/03/2020 0559   MCV 93.1 02/03/2020 0559   MCH 30.4 02/03/2020 0559   MCHC 32.7 02/03/2020 0559   RDW 12.9 02/03/2020 0559   LYMPHSABS 2.0 01/31/2020 0504   MONOABS 0.9 01/31/2020 0504   EOSABS 0.3 01/31/2020 0504   BASOSABS 0.0 01/31/2020 0504    Lab Results  Component Value Date   HGBA1C 5.6 05/27/2020    Assessment & Plan:  1. Essential hypertension Slightly above goal Increased dose of amlodipine Counseled on blood pressure goal of less than 130/80, low-sodium, DASH diet, medication compliance, 150 minutes of moderate intensity exercise per week. Discussed medication compliance, adverse effects. - amLODipine (NORVASC) 5 MG tablet; Take 1 tablet (5 mg total) by mouth daily.  Dispense: 30 tablet; Refill: 6 -  CMP14+EGFR - Lipid panel  2. Left arm pain Ever since his stroke Symptoms are described as mild uncontrolled on ibuprofen No regimen change  3. Peripheral vascular disease, unspecified ([place]) Status post R lower extremity bypass Commended on quitting smoking Risk factor modification - atorvastatin (LIPITOR) 40 MG tablet; Take 1 tablet (40 mg total) by mouth daily at 6 PM.  Dispense: 30 tablet; Refill: 6   Meds ordered this encounter  Medications  . amLODipine (NORVASC) 5 MG tablet    Sig: Take 1 tablet (5 mg total) by mouth daily.    Dispense:  30 tablet    Refill:  6    Dose increase  . atorvastatin (LIPITOR) 40 MG tablet    Sig: Take 1 tablet (40 mg total) by mouth daily at 6 PM.    Dispense:  30 tablet    Refill:  6    Follow-up: Return in about 6 months (around 06/07/2021) for Chronic disease management.       Charlott Rakes, MD, FAAFP. [facility] [place], [place]   12/08/2020, 1:03 PM

## 2020-12-08 NOTE — Progress Notes (Signed)
Has mild pain in left shoulder.

## 2020-12-09 LAB — CMP14+EGFR
ALT: 32 IU/L (ref 0–44)
AST: 22 IU/L (ref 0–40)
Albumin/Globulin Ratio: 1.5 (ref 1.2–2.2)
Albumin: 4.5 g/dL (ref 3.8–4.9)
Alkaline Phosphatase: 76 IU/L (ref 44–121)
BUN/Creatinine Ratio: 13 (ref 9–20)
BUN: 13 mg/dL (ref 6–24)
Bilirubin Total: 0.4 mg/dL (ref 0.0–1.2)
CO2: 25 mmol/L (ref 20–29)
Calcium: 10 mg/dL (ref 8.7–10.2)
Chloride: 100 mmol/L (ref 96–106)
Creatinine, Ser: 1.02 mg/dL (ref 0.76–1.27)
GFR calc Af Amer: 93 mL/min/{1.73_m2} (ref >59)
GFR calc non Af Amer: 81 mL/min/{1.73_m2} (ref >59)
Globulin, Total: 3 g/dL (ref 1.5–4.5)
Glucose: 99 mg/dL (ref 65–99)
Potassium: 4.8 mmol/L (ref 3.5–5.2)
Sodium: 138 mmol/L (ref 134–144)
Total Protein: 7.5 g/dL (ref 6.0–8.5)

## 2020-12-09 LAB — LIPID PANEL
Chol/HDL Ratio: 3.5 ratio (ref 0.0–5.0)
Cholesterol, Total: 143 mg/dL (ref 100–199)
HDL: 41 mg/dL (ref >39)
LDL Chol Calc (NIH): 79 mg/dL (ref 0–99)
Triglycerides: 126 mg/dL (ref 0–149)
VLDL Cholesterol Cal: 23 mg/dL (ref 5–40)

## 2020-12-11 ENCOUNTER — Telehealth: Payer: Self-pay

## 2020-12-11 NOTE — Telephone Encounter (Signed)
Patient name and DOB has been verified Patient was informed of lab results. Patient had no questions.  

## 2020-12-11 NOTE — Telephone Encounter (Signed)
-----   Message from Enobong Newlin, MD sent at 12/09/2020 12:48 PM EST ----- Please inform the patient that labs are normal. Thank you. 

## 2021-01-11 ENCOUNTER — Encounter: Payer: Self-pay | Admitting: Adult Health

## 2021-01-11 ENCOUNTER — Ambulatory Visit (INDEPENDENT_AMBULATORY_CARE_PROVIDER_SITE_OTHER): Payer: Self-pay | Admitting: Adult Health

## 2021-01-11 VITALS — BP 117/77 | HR 75 | Ht 72.0 in | Wt 249.0 lb

## 2021-01-11 DIAGNOSIS — I63511 Cerebral infarction due to unspecified occlusion or stenosis of right middle cerebral artery: Secondary | ICD-10-CM

## 2021-01-11 NOTE — Patient Instructions (Signed)
Continue aspirin 325 mg daily  and atorvastatin 40mg  daily  for secondary stroke prevention  Highly encourage continued smoking cessation!!!   Continue to follow up with PCP regarding cholesterol and blood pressure management  Maintain strict control of hypertension with blood pressure goal below 130/90 and cholesterol with LDL cholesterol (bad cholesterol) goal below 70 mg/dL.         Thank you for coming to see at [facility]. I hope we have been able to provide you high quality care today.  You may receive a patient satisfaction survey over the next few weeks. We would appreciate your feedback and comments so that we may continue to improve ourselves and the health of our patients.

## 2021-01-11 NOTE — Progress Notes (Signed)
[facility] [address] [place]. [place] [id number] [phone]       STROKE FOLLOW UP NOTE  Cody Baird Date of Birth:  08-Dec-1960 Medical Record Number:  [id number]   Reason for Referral: stroke follow up    SUBJECTIVE:   CHIEF COMPLAINT:  Chief Complaint  Patient presents with  . Follow-up    RM 14 Pt states things are good, feels like he is spinning sometimes when he walks    HPI:   Today, 01/11/2021, Cody Baird returns for 1156-month stroke follow-up unaccompanied.  Reports residual left arm irritation or occasionally " bothersome" but denies any pain or weakness Denies new or worsening stroke/TIA symptoms Previously discussed "odd sensation" symptoms as if he is floating -he denies any recent recurrence over the past 5 weeks  Reports compliance on aspirin 81 mg daily and atorvastatin 40 mg daily -denies side effects Blood pressure today 117/77  Reports continued smoking cessation   Lipid panel 12/08/2020 LDL 79  No concerns at this time   History provided for reference purposes only Update 07/13/2020 JM:, Cody Baird returns for stroke follow-up Residual deficit of mild LUE weakness and occasional heaviness sensation which has been stable without worsening He does report occasional "odd sensation" where he feels as though he is floating but denies any associated symptoms such as visual changes, dizziness, vertigo, imbalance, headache, weakness or syncopal type symptoms.  Denies new or worsening stroke/TIA symptoms 14-day cardiac monitor negative for atrial fibrillation Recommended loop recorder placement but unfortunately due to cost, he declines placement He is currently uninsured and apparently did not qualify for [facility] financial assistance program Remains on aspirin 81 mg daily without bleeding or bruising Continues on atorvastatin 40 mg daily without myalgias Blood pressure today 140/92 -does not routinely monitor at home No further concerns  at this time  Hospital follow-up 03/11/2020 JM: Cody Baird is being seen for hospital follow-up.  He has recovered well since discharge with improvement of motor strength and ambulation and only complaints are occasional numbness/tingling left hand/wrist and slower cognition with increased fatigue after exertion.  He does endorse ongoing improvement.  He did not participate in any additional therapies after CIR due to lack of insurance coverage.  He has completely quit smoking since discharge.  He does report change in dietary habits and is aware of need for weight loss.  Continues on DAPT without bleeding or bruising.  Continues on atorvastatin without myalgias.  Blood pressure today 134/86. Evaluated by cardiology yesterday who ordered 14-day cardiac monitor as recommended at discharge to assess for possible atrial fibrillation.  No concerns at this time.  Stroke admission 01/27/2020: Cody Baird is a 60 y.o. male with history of PVD s/p fem pop with multiple toe amputations, heavy smoker and temporary colostomy for prolapsed rectum who presented on 01/27/2020 with 1-day hx L sided weakness and slurred speech.  Stroke work-up revealed scattered right MCA territory infarct, embolic pattern, etiology uncertain concerning for large vessel source with evidence of moderate distal right M1 stenosis.  Also recommended 30-day cardiac event monitor to rule out atrial fibrillation.  Hypercoagulable labs negative.  UDS positive for THC.  Recommended DAPT for 3 months then aspirin alone given high-grade intracranial stenosis per Dr. Roda ShuttersXu.  History of HTN stable.  LDL 144 initiate atorvastatin 40 mg daily.  Other stroke risk factors include B12 deficiency, heavy tobacco use approx 3pack/day for 41 years and PVD s/p fem pop with no prior history of stroke.  Residual deficits of mild left facial droop and left hemiparesis and discharged to CIR for ongoing therapy needs.  He was eventually discharged home on  02/06/2020.  Stroke: scattered R MCA territory infarct, embolic pattern, etiology uncertain, concerning for large vessel source  Code Stroke CT head No acute abnormality.   CTA head no LVO. IC atherosclerosis: moderate distal R M1, high-grade L A3-4, R VA stenosis  CTA neck mixed plaque proximal L ICA. pulm emphysema  MRI  Multifocal R MCA territory infarcts   LE Doppler  No DVT  2D Echo EF 60-65%. No source of embolus   Pt refused TEE  TCD bubble study negative for PFO, but possible insignificant small pulmonary shunt  Will consider 30 day cardiac event monitoring as outpt to rule out afib   Hypercoagulable labs neg   UDS positive for THC  LDL 144  HgbA1c 5.7  Lovenox 40 mg sq daily for VTE prophylaxis  No antithrombotic prior to admission, now on aspirin 325 mg daily and clopidogrel 75 mg daily. Given high-grade intracranial stenosis, continue DAPT x 3 months then aspirin alone.  Therapy recommendations:  CIR        ROS:   14 system review of systems performed and negative with exception of those listed in HPI  PMH:  Past Medical History:  Diagnosis Date  . Acute right MCA stroke (HCC) 01/30/2020  . Aftercare following surgery of the circulatory system, NEC 01/14/2014  . Arthritis    low back and left hip  . 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 Atherosclerotic peripheral vascular disease with rest pain (HCC) 10/30/2013  . B12 deficiency   . Bradycardia   . Chronic back pain   . CVA (cerebral vascular accident) (HCC) 01/28/2020  . Essential hypertension 01/28/2020  . Headache(784.0)    occasionally and sinus related   . History of bronchitis    last time about 2-6yrs ago  . Ischemic pain of foot 12/24/2013  . PAD (peripheral artery disease) (HCC)   . Pain in limb 06/25/2013  . PBA (pseudobulbar affect)   . Peripheral vascular disease (HCC)   . Peripheral vascular disease, unspecified (HCC) 06/25/2013  .  Post-op pain 09/17/2013  . Post-operative pain 07/16/2013  . Prolapsed, anus    hx of  . PVD (peripheral vascular disease) (HCC) 09/17/2013  . Stroke (HCC) 01/2020  . TIA (transient ischemic attack) 01/28/2020  . Tobacco abuse 01/28/2020  . Venous stasis ulcer of ankle, right (HCC) 02/17/2020    PSH:  Past Surgical History:  Procedure Laterality Date  . ABDOMINAL AORTAGRAM N/A 06/26/2013   Procedure: ABDOMINAL AORTAGRAM;  Surgeon: Larina Earthly, MD;  Location: [facility] CATH LAB;  Service: Cardiovascular;  Laterality: N/A;  . AMPUTATION Right 12/04/2013   Procedure: AMPUTATION DIGIT- 3RD TOE RIGHT FOOT ;  Surgeon: Larina Earthly, MD;  Location: [facility] OR;  Service: Vascular;  Laterality: Right;  . AMPUTATION Right 01/15/2014   Procedure: AMPUTATION OF RIGHT 4TH and 5TH TOES;  Surgeon: Larina Earthly, MD;  Location[facility] OR;  Service: Vascular;  Laterality: Right;  . BACK SURGERY     L1-2  . BACK SURGERY     L1-2  . BACK SURGERY     thinks L1- 2  . COLON SURGERY     removal of colon due to proplase rectum  . COLOSTOMY    . COLOSTOMY CLOSURE  2006ish  . ESOPHAGOGASTRODUODENOSCOPY    . FEMORAL-TIBIAL BYPASS GRAFT Right 06/28/2013  Procedure: BYPASS GRAFT FEMORAL TO TIBIAL PERONEAL TRUNK ARTERY;  Surgeon: Larina Earthly, MD;  Location: [facility] OR;  Service: Vascular;  Laterality: Right;    Social History:  Social History   Socioeconomic History  . Marital status: Divorced    Spouse name: Not on file  . Number of children: Not on file  . Years of education: Not on file  . Highest education level: Not on file  Occupational History  . Not on file  Tobacco Use  . Smoking status: Former Smoker    Packs/day: 0.50    Years: 36.00    Pack years: 18.00    Types: Cigarettes    Quit date: 01/27/2020    Years since quitting: 0.9  . Smokeless tobacco: Never Used  Substance and Sexual Activity  . Alcohol use: No    Alcohol/week: 0.0 standard drinks  . Drug use: No  . Sexual activity: Not on file  Other Topics  Concern  . Not on file  Social History Narrative  . Not on file   Social Determinants of Health   Financial Resource Strain: Not on file  Food Insecurity: Not on file  Transportation Needs: Not on file  Physical Activity: Not on file  Stress: Not on file  Social Connections: Not on file  Intimate Partner Violence: Not on file    Family History:  Family History  Problem Relation Age of Onset  . Cancer Mother   . Diabetes Mother   . Diabetes Father   . Hyperlipidemia Father   . Hypertension Father   . Heart attack Father     Medications:   Current Outpatient Medications on File Prior to Visit  Medication Sig Dispense Refill  . amLODipine (NORVASC) 5 MG tablet Take 1 tablet (5 mg total) by mouth daily. 30 tablet 6  . aspirin 325 MG EC tablet Take 1 tablet (325 mg total) by mouth daily. 100 tablet 1  . atorvastatin (LIPITOR) 40 MG tablet Take 1 tablet (40 mg total) by mouth daily at 6 PM. 30 tablet 6   No current facility-administered medications on file prior to visit.    Allergies:   Allergies  Allergen Reactions  . Penicillins Other (See Comments)    Childhood allergy not sure what the reaction is  Did it involve swelling of the face/tongue/throat, SOB, or low BP? Unknown Did it involve sudden or severe rash/hives, skin peeling, or any reaction on the inside of your mouth or nose? Unknown Did you need to seek medical attention at a hospital or doctor's office? Unknown When did it last happen?childhood If all above answers are "NO", may proceed with cephalosporin use.       OBJECTIVE:  Physical Exam  Vitals:   01/11/21 1330  BP: 117/77  Pulse: 75  Weight: 249 lb (112.9 kg)  Height: 6' (1.829 m)   Body mass index is 33.77 kg/m. No exam data present  General: well developed, well nourished,  pleasant middle-age Caucasian male, seated, in no evident distress Head: head normocephalic and atraumatic.   Neck: supple with no carotid or supraclavicular  bruits Cardiovascular: regular rate and rhythm, no murmurs Musculoskeletal: no deformity Skin:  no rash/petichiae  Neurologic Exam Mental Status: Awake and fully alert. Fluent speech and language. Oriented to place and time. Recent and remote memory intact. Attention span, concentration and fund of knowledge appropriate. Mood and affect appropriate.  Cranial Nerves: Pupils equal, briskly reactive to light. Extraocular movements full without nystagmus. Visual fields full to confrontation.  Hearing intact. Facial sensation intact. Face, tongue, palate moves normally and symmetrically.  Motor: Normal bulk and tone. Normal strength in all tested extremity muscles. Sensory.: intact to touch , pinprick , position and vibratory sensation.  Coordination: Rapid alternating movements normal in all extremities. Finger-to-nose and heel-to-shin performed showed mild left-sided ataxia. Gait and Station: Arises from chair without difficulty. Stance is normal. Gait demonstrates normal stride length and balance  without assistive device Reflexes: 1+ and symmetric. Toes downgoing.       ASSESSMENT: Cody Baird is a 60 y.o. year old male presented with 1 day history of left-sided weakness and slurred speech on 01/27/2020 with stroke work-up revealing scattered right MCA territory infarct, embolic pattern secondary to unclear source concerning for large vessel source with moderate distal right M1 stenosis. Vascular risk factors include PVD s/p fem pop, tobacco use, B12 deficiency, high-grade intracranial stenosis, HTN and HLD.       PLAN:  1. Right MCA stroke:  a. Recovered well without residual deficits b. 14-day cardiac event monitor negative for atrial fibrillation.  Due to lack of insurance, he declined further cardiac evaluation with loop recorder placement c. Continue aspirin 325 mg daily  and atorvastatin for secondary stroke prevention.  d. Discussed secondary stroke prevention measures and  importance of close PCP follow-up for aggressive stroke risk factor management e. HTN: BP goal<130/90.  Stable.  Managed by PCP. f. HLD: LDL goal<70.  Recent LDL 79 (11/2020). On atorvastatin 40 mg daily per PCP 2. Hx of Tobacco use:  a. Encouraged continued cessation for secondary stroke prevention and history of PVD    Overall stable from stroke standpoint with routine close follow-up with PCP and cardiology and recommend follow-up on an as-needed basis regarding his prior stroke at this time   CC:  [facility] provider: Dr. Nicholaus Bloom, Odette Horns, MD     I spent 30 minutes of face-to-face and non-face-to-face time with patient.  This included previsit chart review, lab review, study review, order entry, electronic health record documentation, patient education regarding prior stroke and etiology, improvement of residual deficits, importance of managing stroke risk factors and answered all other questions to patient satisfaction   Ihor Austin, [facility]  [facility] [address] [place], [place] [address]  Phone [phone] Fax [phone] Note: This document was prepared with digital dictation and possible smart phrase technology. Any transcriptional errors that result from this process are unintentional.

## 2021-01-12 NOTE — Progress Notes (Signed)
I agree with the above plan 

## 2021-04-09 ENCOUNTER — Encounter: Payer: Self-pay | Admitting: Cardiology

## 2021-04-09 ENCOUNTER — Other Ambulatory Visit: Payer: Self-pay

## 2021-04-09 ENCOUNTER — Ambulatory Visit (INDEPENDENT_AMBULATORY_CARE_PROVIDER_SITE_OTHER): Payer: Self-pay | Admitting: Cardiology

## 2021-04-09 VITALS — BP 112/70 | HR 82 | Ht 72.0 in | Wt 255.4 lb

## 2021-04-09 DIAGNOSIS — I639 Cerebral infarction, unspecified: Secondary | ICD-10-CM

## 2021-04-09 DIAGNOSIS — I10 Essential (primary) hypertension: Secondary | ICD-10-CM

## 2021-04-09 DIAGNOSIS — E782 Mixed hyperlipidemia: Secondary | ICD-10-CM

## 2021-04-09 DIAGNOSIS — F17200 Nicotine dependence, unspecified, uncomplicated: Secondary | ICD-10-CM

## 2021-04-09 NOTE — Patient Instructions (Addendum)
Medication Instructions:  Your physician recommends that you continue on your current medications as directed. Please refer to the Current Medication list given to you today.  *If you need a refill on your cardiac medications before your next appointment, please call your pharmacy*   Lab Work: None If you have labs (blood work) drawn today and your tests are completely normal, you will receive your results only by: MyChart Message (if you have MyChart) OR A paper copy in the mail If you have any lab test that is abnormal or we need to change your treatment, we will call you to review the results.   Testing/Procedures: NOne   Follow-Up: At [facility], you and your health needs are our priority.  As part of our continuing mission to provide you with exceptional heart care, we have created designated Provider Care Teams.  These Care Teams include your primary Cardiologist (physician) and Advanced Practice Providers (APPs -  Physician Assistants and Nurse Practitioners) who all work together to provide you with the care you need, when you need it.  We recommend signing up for the patient portal called "MyChart".  Sign up information is provided on this After Visit Summary.  MyChart is used to connect with patients for Virtual Visits (Telemedicine).  Patients are able to view lab/test results, encounter notes, upcoming appointments, etc.  Non-urgent messages can be sent to your provider as well.   To learn more about what you can do with MyChart, go to [email or web address].    Your next appointment:   1 year(s)  The format for your next appointment:   In Person  Provider:   Brionna Romanek, MD   Other Instructions [email or web address]        FDA-cleared, clinical grade mobile EKG monitor: Kardia is the most clinically-validated mobile EKG used by the world's leading cardiac care medical professionals With Basic service, know instantly  if your heart rhythm is normal or if atrial fibrillation is detected, and email the last single EKG recording to yourself or your doctor Premium service, available for purchase through the Kardia app for $9.99 per month or $99 per year, includes unlimited history and storage of your EKG recordings, a monthly EKG summary report to share with your doctor, along with the ability to track your blood pressure, activity and weight Includes one KardiaMobile phone clip FREE SHIPPING: Standard delivery 1-3 business days. Orders placed by 11:00am PST will ship that afternoon. Otherwise, will ship next business day. All orders ship via USPS Priority Mail from [place], [place]     

## 2021-04-09 NOTE — Progress Notes (Signed)
Cardiology Office Note:    Date:  04/09/2021   ID:  Cody Baird, DOB 1961/05/26, MRN [id number]  PCP:  Hoy Register, MD  Cardiologist:  Norman Herrlich, MD    Referring MD: Hoy Register, MD    ASSESSMENT:    1. Cerebrovascular accident (CVA), unspecified mechanism (HCC)   2. Essential hypertension   3. Mixed hyperlipidemia   4. Smoker    PLAN:    In order of problems listed above:  1. He declined an implanted loop recorder I strongly encouraged him to find a more long EKG monitor and screen himself several times a week or daily for atrial fibrillation signed my chart and send any strips of atrial fibrillation or possible to trigger anticoagulation for stroke prophylaxis. 2. Stable at target continue calcium channel blocker 3. Stable continue with statin LDL at target 4. He continues to not smoke   Next appointment: 1 year   Medication Adjustments/Labs and Tests Ordered: Current medicines are reviewed at length with the patient today.  Concerns regarding medicines are outlined above.  No orders of the defined types were placed in this encounter.  No orders of the defined types were placed in this encounter.   Chief Complaint  Patient presents with  . Follow-up    History of Present Illness:    Cody Baird is a 60 y.o. male with a hx of stroke peripheral vascular disease hypertension hyperlipidemia last seen 09/17/2020.  Compliance with diet, lifestyle and medications: Yes  Describes his heart rhythm BP: Has not had any indication of atrial fibrillation  I encouraged him to purchase the wireless adapter for his iPhone to screen his heart rhythm with the mobile EKG monitor.  Sign up for my chart and contact and assess possible for atrial fibrillation. Has had no palpitations syncope edema shortness of breath chest pain He has not returned to cigarette smoking. He is frustrated by weight gain.  Recent labs 12/08/2020 cholesterol 143 LDL 79 HDL 41 triglycerides  125 I7P 5.6%  A 14-day event monitor reported 03/11/2020 showed rare ventricular and supraventricular ectopy and no episodes of atrial fibrillation.  Echocardiogram 01/28/2020 showed normal ventricular size EF 60 to 65% normal systolic function.  He had no cardiac findings with source of embolism or stroke Past Medical History:  Diagnosis Date  . Acute right MCA stroke (HCC) 01/30/2020  . Aftercare following surgery of the circulatory system, NEC 01/14/2014  . Arthritis    low back and left hip  . 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 Atherosclerotic peripheral vascular disease with rest pain (HCC) 10/30/2013  . B12 deficiency   . Bradycardia   . Chronic back pain   . CVA (cerebral vascular accident) (HCC) 01/28/2020  . Essential hypertension 01/28/2020  . Headache(784.0)    occasionally and sinus related   . History of bronchitis    last time about 2-30yrs ago  . Ischemic pain of foot 12/24/2013  . PAD (peripheral artery disease) (HCC)   . Pain in limb 06/25/2013  . PBA (pseudobulbar affect)   . Peripheral vascular disease (HCC)   . Peripheral vascular disease, unspecified (HCC) 06/25/2013  . Post-op pain 09/17/2013  . Post-operative pain 07/16/2013  . Prolapsed, anus    hx of  . PVD (peripheral vascular disease) (HCC) 09/17/2013  . Stroke (HCC) 01/2020  . TIA (transient ischemic attack) 01/28/2020  . Tobacco abuse 01/28/2020  . Venous stasis ulcer of ankle, right (HCC) 02/17/2020  Past Surgical History:  Procedure Laterality Date  . ABDOMINAL AORTAGRAM N/A 06/26/2013   Procedure: ABDOMINAL AORTAGRAM;  Surgeon: Larina Earthly, MD;  Location: [facility] CATH LAB;  Service: Cardiovascular;  Laterality: N/A;  . AMPUTATION Right 12/04/2013   Procedure: AMPUTATION DIGIT- 3RD TOE RIGHT FOOT ;  Surgeon: Larina Earthly, MD;  Location: [facility] OR;  Service: Vascular;  Laterality: Right;  . AMPUTATION Right 01/15/2014   Procedure: AMPUTATION OF RIGHT 4TH and 5TH  TOES;  Surgeon: Larina Earthly, MD;  Location: [facility] OR;  Service: Vascular;  Laterality: Right;  . BACK SURGERY     L1-2  . BACK SURGERY     L1-2  . BACK SURGERY     thinks L1- 2  . COLON SURGERY     removal of colon due to proplase rectum  . COLOSTOMY    . COLOSTOMY CLOSURE  2006ish  . ESOPHAGOGASTRODUODENOSCOPY    . FEMORAL-TIBIAL BYPASS GRAFT Right 06/28/2013   Procedure: BYPASS GRAFT FEMORAL TO TIBIAL PERONEAL TRUNK ARTERY;  Surgeon: Larina Earthly, MD;  Location: [facility] OR;  Service: Vascular;  Laterality: Right;    Current Medications: Current Meds  Medication Sig  . amLODipine (NORVASC) 5 MG tablet Take 1 tablet (5 mg total) by mouth daily.  Marland Kitchen aspirin 325 MG EC tablet Take 1 tablet (325 mg total) by mouth daily.  Marland Kitchen atorvastatin (LIPITOR) 40 MG tablet Take 1 tablet (40 mg total) by mouth daily at 6 PM.     Allergies:   Penicillins   Social History   Socioeconomic History  . Marital status: Divorced    Spouse name: Not on file  . Number of children: Not on file  . Years of education: Not on file  . Highest education level: Not on file  Occupational History  . Not on file  Tobacco Use  . Smoking status: Former Smoker    Packs/day: 0.50    Years: 36.00    Pack years: 18.00    Types: Cigarettes    Quit date: 01/27/2020    Years since quitting: 1.2  . Smokeless tobacco: Never Used  Substance and Sexual Activity  . Alcohol use: No    Alcohol/week: 0.0 standard drinks  . Drug use: No  . Sexual activity: Not on file  Other Topics Concern  . Not on file  Social History Narrative  . Not on file   Social Determinants of Health   Financial Resource Strain: Not on file  Food Insecurity: Not on file  Transportation Needs: Not on file  Physical Activity: Not on file  Stress: Not on file  Social Connections: Not on file     Family History: The patient's family history includes Cancer in his mother; Diabetes in his father and mother; Heart attack in his father;  Hyperlipidemia in his father; Hypertension in his father. ROS:   Please see the history of present illness.    All other systems reviewed and are negative.  EKGs/Labs/Other Studies Reviewed:    The following studies were reviewed today:  EKG:  EKG ordered today and personally reviewed.  The ekg ordered today demonstrates sinus rhythm and is normal  Recent Labs: 12/08/2020: ALT 32; BUN 13; Creatinine, Ser 1.02; Potassium 4.8; Sodium 138  Recent Lipid Panel    Component Value Date/Time   CHOL 143 12/08/2020 0954   TRIG 126 12/08/2020 0954   HDL 41 12/08/2020 0954   CHOLHDL 3.5 12/08/2020 0954   CHOLHDL 6.6 01/28/2020 0612   VLDL 31  01/28/2020 0612   LDLCALC 79 12/08/2020 0954    Physical Exam:    VS:  BP 112/70   Pulse 82   Ht 6' (1.829 m)   Wt 255 lb 6.4 oz (115.8 kg)   SpO2 96%   BMI 34.64 kg/m     Wt Readings from Last 3 Encounters:  04/09/21 255 lb 6.4 oz (115.8 kg)  01/11/21 249 lb (112.9 kg)  12/08/20 253 lb 9.6 oz (115 kg)     GEN:  Well nourished, well developed in no acute distress HEENT: Normal NECK: No JVD; No carotid bruits LYMPHATICS: No lymphadenopathy CARDIAC: RRR, no murmurs, rubs, gallops RESPIRATORY:  Clear to auscultation without rales, wheezing or rhonchi  ABDOMEN: Soft, non-tender, non-distended MUSCULOSKELETAL:  No edema; No deformity  SKIN: Warm and dry NEUROLOGIC:  Alert and oriented x 3 PSYCHIATRIC:  Normal affect    Signed, Norman Herrlich, MD  04/09/2021 3:23 PM    [facility]

## 2021-06-23 IMAGING — CT CT ANGIO HEAD
1 of 8 series · 14 of 47 positions shown · IV contrast (OMNI)
Comparison: Noncontrast head CT performed earlier the same day
01/27/2020.

CLINICAL DATA: Stroke, follow-up. Additional history provided:
Facial droop, left arm weakness.

EXAM:
CT ANGIOGRAPHY HEAD AND NECK
TECHNIQUE: Multidetector CT imaging of the head and neck was performed using
the standard protocol during bolus administration of intravenous
contrast. Multiplanar CT image reconstructions and MIPs were
obtained to evaluate the vascular anatomy. Carotid stenosis
measurements (when applicable) are obtained utilizing NASCET
criteria, using the distal internal carotid diameter as the
denominator.
CONTRAST:  75mL OMNIPAQUE IOHEXOL 350 MG/ML SOLN

[Series 12: thin · axial · 0.50mm/px · z∈[-333,-8]mm · 14 of 753 slices shown]
[im 51/753  brain]
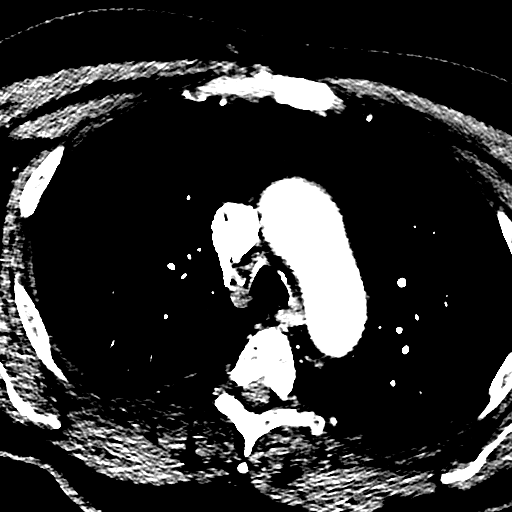
[im 101/753  bone]
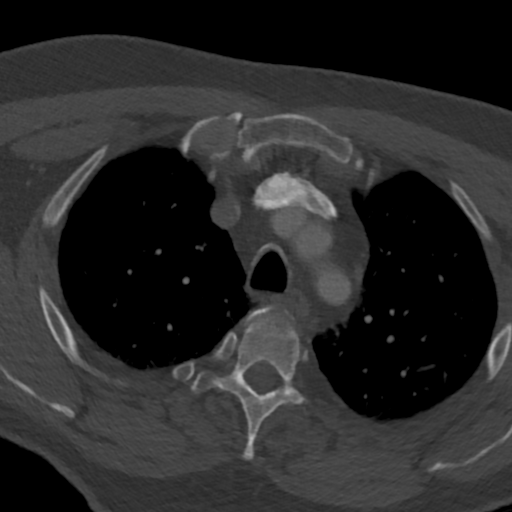
[im 151/753  brain]
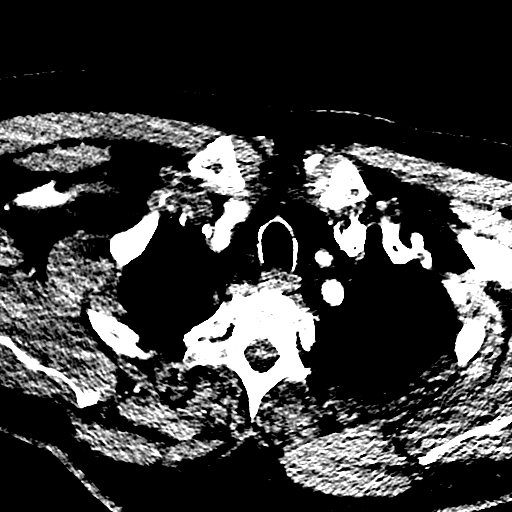
[im 201/753  bone]
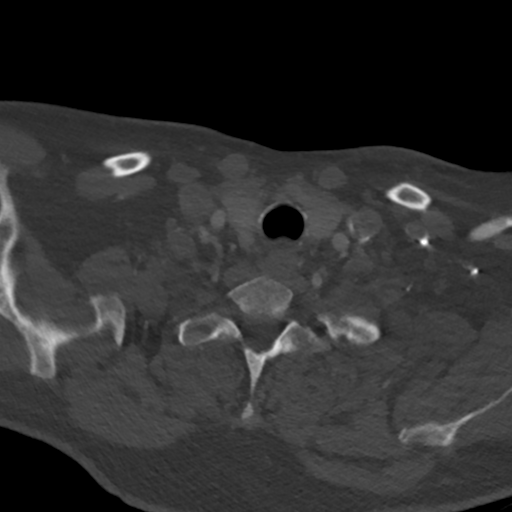
[im 251/753  brain]
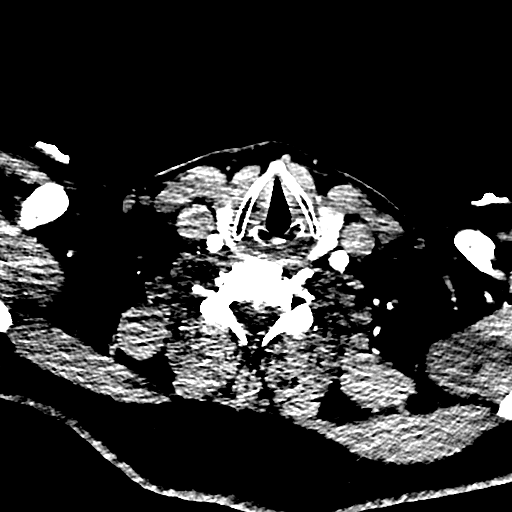
[im 301/753  bone]
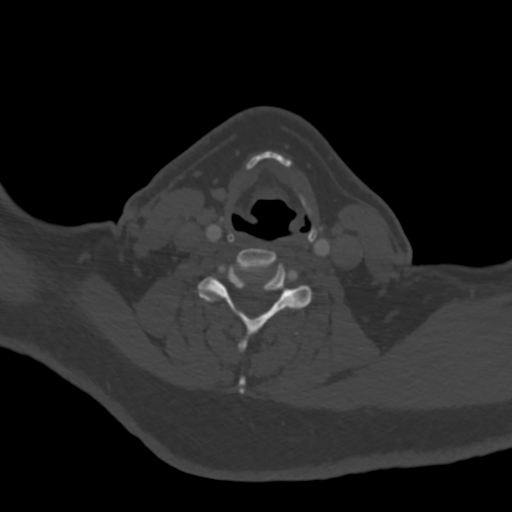
[im 351/753  brain]
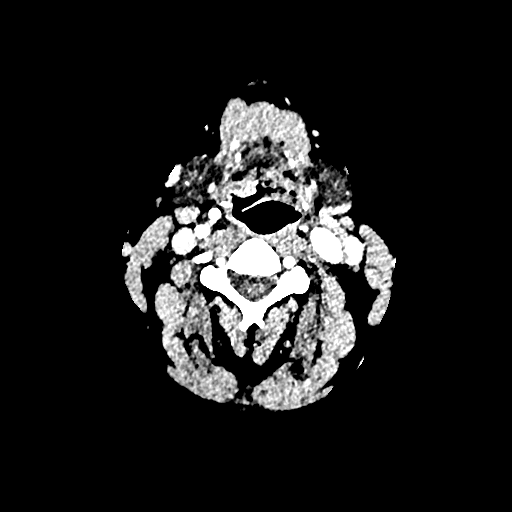
[im 402/753  bone]
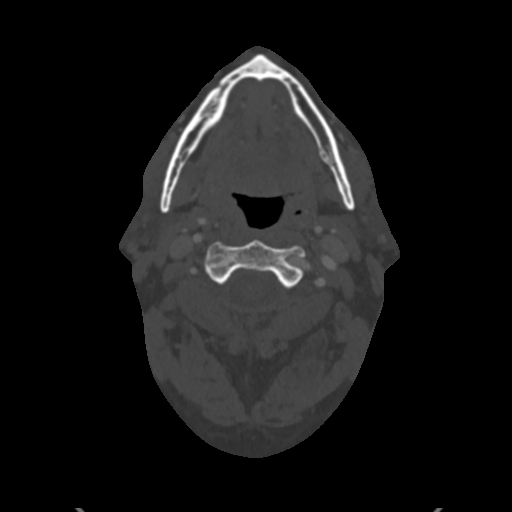
[im 452/753  brain]
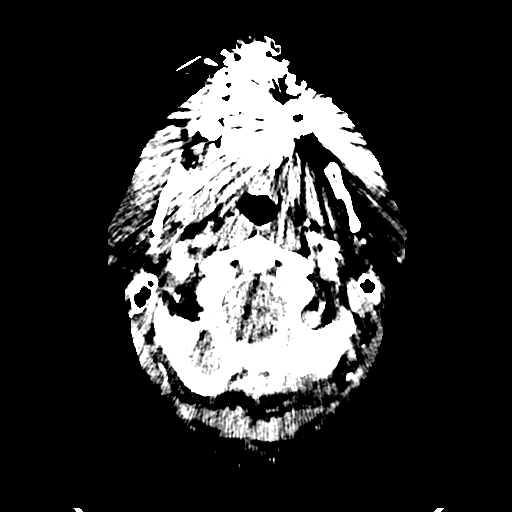
[im 502/753  bone]
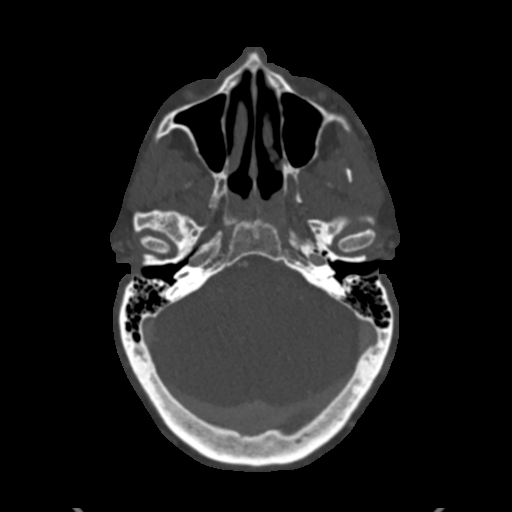
[im 552/753  brain]
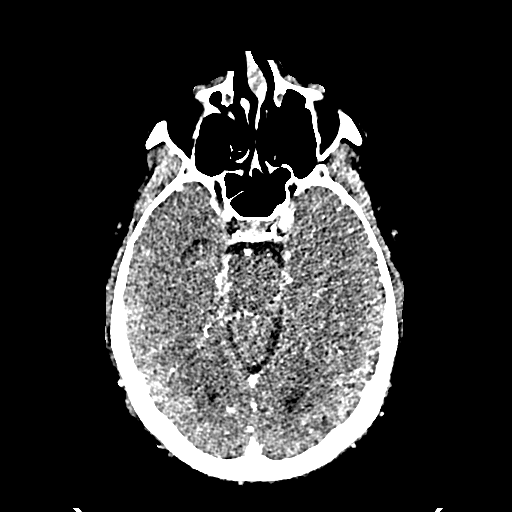
[im 602/753  bone]
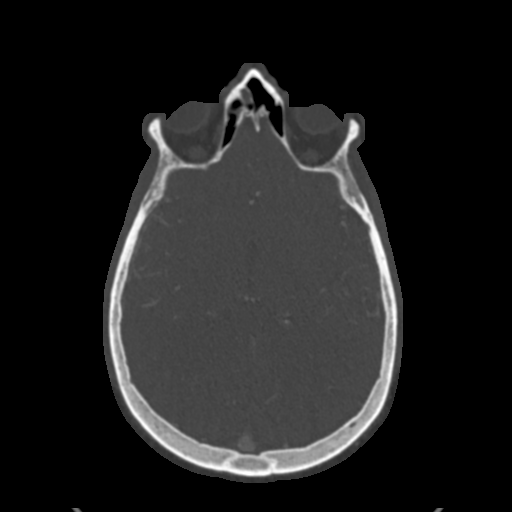
[im 652/753  brain]
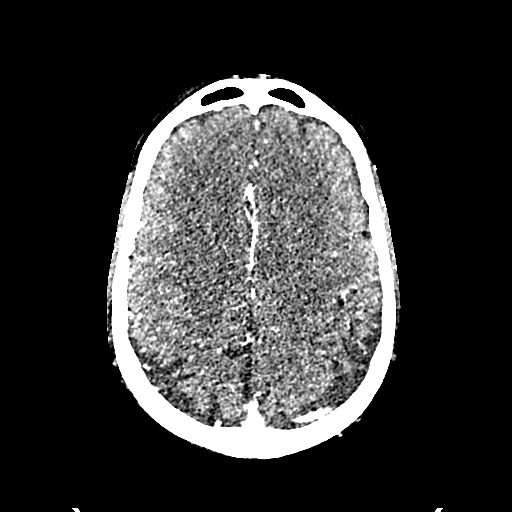
[im 702/753  bone]
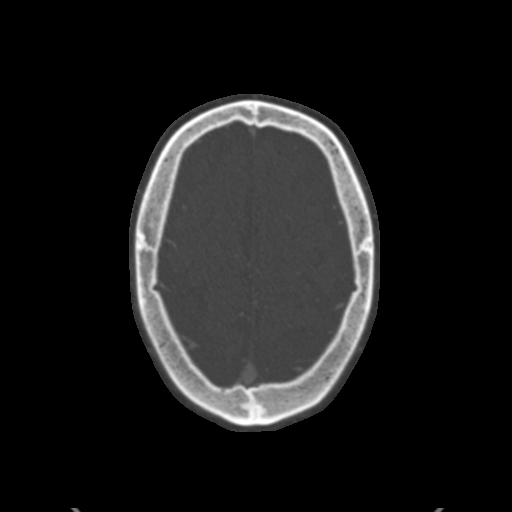

[14 of 47 positions shown; findings below may reference images not displayed]

FINDINGS: CTA NECK FINDINGS

Aortic arch: Standard aortic branching. The visualized aortic arch
is unremarkable. No significant innominate or proximal subclavian
artery stenosis.

Right carotid system: CCA and ICA patent within the neck without
stenosis. Tortuosity of the cervical ICA.

Left carotid system: CCA and ICA patent within the neck without
stenosis. Mild mixed plaque within the proximal ICA.

Vertebral arteries: Left vertebral artery dominant. The vertebral
arteries are patent within the neck bilaterally without significant
stenosis.

Skeleton: No acute bony abnormality or aggressive osseous lesion.
Cervical spondylosis. Most notably there is a C5-C6 disc bulge with
associated endplate spurring and osteophyte ridge. Bilateral neural
foraminal narrowing at least mild bony spinal canal stenosis at this
level.

Other neck: No neck mass or cervical lymphadenopathy.

Upper chest: Centrilobular emphysema.

Review of the MIP images confirms the above findings

CTA HEAD FINDINGS

Anterior circulation:

The intracranial internal carotid arteries are patent without
significant stenosis.

There is a moderate to moderately advanced stenosis within the
distal M1 right MCA (series 11, image 58) (series 6, image 289). No
right M2 proximal branch occlusion is identified. The M1 left middle
cerebral artery is patent without significant stenosis. No left M2
proximal branch occlusion is identified. The anterior cerebral
arteries are patent without high-grade proximal stenosis. The A1
left ACA is dominant. There is an apparent high-grade stenosis
within the A3-A4 left ACA (series 10, image 30). No intracranial
aneurysm is identified.

Posterior circulation:

The intracranial internal carotid arteries are patent. Mixed plaque
results in sites of up to moderate stenosis within the intracranial
right vertebral artery. No more than mild atherosclerotic narrowing
of the intracranial left vertebral artery. The basilar artery is
patent with mild atherosclerotic irregularity. The posterior
cerebral arteries are patent bilaterally without significant
proximal stenosis. A left posterior communicating artery is present.
A right posterior communicating artery is not definitively
identified.

Venous sinuses: Within limitations of contrast timing, no convincing
thrombus.

Anatomic variants: As described.

Review of the MIP images confirms the above findings

These results were called by telephone at the time of interpretation
on 01/27/2020 at [DATE] to provider ONNENG CHOSE , who verbally
acknowledged these results.
IMPRESSION: CTA neck:

1. The bilateral common carotid, internal carotid and vertebral
arteries are patent within the neck without significant stenosis.
Mild mixed plaque within the proximal left ICA.
2. Pulmonary emphysema.

CTA head:

1. No intracranial large vessel occlusion.
2. Atherosclerotic disease with multifocal stenoses, most notably as
follows.
3. Moderate to moderately advanced stenosis within the distal M1
right MCA.
4. Apparent high-grade focal stenosis within an A3-A4 left ACA
branch vessel.
5. Sites of up to moderate atherosclerotic narrowing within the
intracranial right vertebral artery.

## 2021-07-07 ENCOUNTER — Other Ambulatory Visit: Payer: Self-pay | Admitting: Family Medicine

## 2021-07-07 ENCOUNTER — Telehealth: Payer: Self-pay | Admitting: Family Medicine

## 2021-07-07 DIAGNOSIS — I10 Essential (primary) hypertension: Secondary | ICD-10-CM

## 2021-07-07 DIAGNOSIS — I739 Peripheral vascular disease, unspecified: Secondary | ICD-10-CM

## 2021-07-07 NOTE — Telephone Encounter (Signed)
Duplicate request  Patient has scheduled appt for 09/29/2021 and would like enough medication until that time

## 2021-07-07 NOTE — Telephone Encounter (Signed)
   Notes to clinic:  Patient due for follow up on 06/07/2021 No appt has been scheduled     Requested Prescriptions  Pending Prescriptions Disp Refills   amLODipine (NORVASC) 5 MG tablet [Pharmacy Med Name: AMLODIPINE BESYLATE 5 MG TAB] 30 tablet 6    Sig: TAKE 1 TABLET BY MOUTH ONCE DAILY     Cardiovascular:  Calcium Channel Blockers Failed - 07/07/2021  1:26 PM      Failed - Valid encounter within last 6 months    Recent Outpatient Visits           7 months ago Essential hypertension   [facility] [place], [place], MD   1 year ago Neuropathy   [facility] [place], Odette Horns, MD   1 year ago Acute right MCA stroke [facility])   [facility] [place], Odette Horns, MD              Passed - Last BP in normal range    BP Readings from Last 1 Encounters:  04/09/21 112/70           atorvastatin (LIPITOR) 40 MG tablet [Pharmacy Med Name: ATORVASTATIN CALCIUM 40 MG TAB] 30 tablet 6    Sig: TAKE 1 TABLET BY MOUTH ONCE DAILY AT 6PM     Cardiovascular:  Antilipid - Statins Passed - 07/07/2021  1:26 PM      Passed - Total Cholesterol in normal range and within 360 days    Cholesterol, Total  Date Value Ref Range Status  12/08/2020 143 100 - 199 mg/dL Final          Passed - LDL in normal range and within 360 days    LDL Chol Calc (NIH)  Date Value Ref Range Status  12/08/2020 79 0 - 99 mg/dL Final          Passed - HDL in normal range and within 360 days    HDL  Date Value Ref Range Status  12/08/2020 41 >39 mg/dL Final          Passed - Triglycerides in normal range and within 360 days    Triglycerides  Date Value Ref Range Status  12/08/2020 126 0 - 149 mg/dL Final          Passed - Patient is not pregnant      Passed - Valid encounter within last 12 months    Recent Outpatient Visits           7 months ago Essential hypertension   [facility] [place],  Odette Horns, MD   1 year ago Neuropathy   [facility] Hoy Register, MD   1 year ago Acute right MCA stroke [facility])   [facility] Hoy Register, MD

## 2021-07-07 NOTE — Telephone Encounter (Signed)
Medication Refill - Medication:  atorvastatin (LIPITOR) 40 MG tablet  amLODipine (NORVASC) 5 MG tablet  *Pt does have appt set on 11/16, asked if he could get refill to hold him until then*  Has the patient contacted their pharmacy? No.  Preferred Pharmacy (with phone number or street name):  [facility] - [place], [place] - [address]  Phone:  [phone] Fax:  [phone]  Agent: Please be advised that RX refills may take up to 3 business days. We ask that you follow-up with your pharmacy.

## 2021-08-10 ENCOUNTER — Other Ambulatory Visit: Payer: Self-pay | Admitting: Family Medicine

## 2021-08-10 DIAGNOSIS — I10 Essential (primary) hypertension: Secondary | ICD-10-CM

## 2021-09-29 ENCOUNTER — Other Ambulatory Visit: Payer: Self-pay

## 2021-09-29 ENCOUNTER — Encounter: Payer: Self-pay | Admitting: Family Medicine

## 2021-09-29 ENCOUNTER — Ambulatory Visit: Payer: Self-pay | Attending: Family Medicine | Admitting: Family Medicine

## 2021-09-29 VITALS — BP 122/82 | HR 73 | Ht 72.0 in | Wt 262.2 lb

## 2021-09-29 DIAGNOSIS — R7303 Prediabetes: Secondary | ICD-10-CM

## 2021-09-29 DIAGNOSIS — Z1211 Encounter for screening for malignant neoplasm of colon: Secondary | ICD-10-CM

## 2021-09-29 DIAGNOSIS — I10 Essential (primary) hypertension: Secondary | ICD-10-CM

## 2021-09-29 DIAGNOSIS — I739 Peripheral vascular disease, unspecified: Secondary | ICD-10-CM

## 2021-09-29 LAB — POCT GLYCOSYLATED HEMOGLOBIN (HGB A1C): HbA1c, POC (controlled diabetic range): 6 % (ref 0.0–7.0)

## 2021-09-29 MED ORDER — ATORVASTATIN CALCIUM 40 MG PO TABS: 40.0000 mg | ORAL_TABLET | Freq: Every day | ORAL | 1 refills | Status: DC

## 2021-09-29 MED ORDER — AMLODIPINE BESYLATE 5 MG PO TABS: 5.0000 mg | ORAL_TABLET | Freq: Every day | ORAL | 1 refills | Status: DC

## 2021-09-29 NOTE — Patient Instructions (Signed)
Prediabetes Eating Plan °Prediabetes is a condition that causes blood sugar (glucose) levels to be higher than normal. This increases the risk for developing type 2 diabetes (type 2 diabetes mellitus). Working with a health care provider or nutrition specialist (dietitian) to make diet and lifestyle changes can help prevent the onset of diabetes. These changes may help you: °Control your blood glucose levels. °Improve your cholesterol levels. °Manage your blood pressure. °What are tips for following this plan? °Reading food labels °Read food labels to check the amount of fat, salt (sodium), and sugar in prepackaged foods. Avoid foods that have: °Saturated fats. °Trans fats. °Added sugars. °Avoid foods that have more than 300 milligrams (mg) of sodium per serving. Limit your sodium intake to less than 2,300 mg each day. °Shopping °Avoid buying pre-made and processed foods. °Avoid buying drinks with added sugar. °Cooking °Cook with olive oil. Do not use butter, lard, or ghee. °Bake, broil, grill, steam, or boil foods. Avoid frying. °Meal planning ° °Work with your dietitian to create an eating plan that is right for you. This may include tracking how many calories you take in each day. Use a food diary, notebook, or mobile application to track what you eat at each meal. °Consider following a Mediterranean diet. This includes: °Eating several servings of fresh fruits and vegetables each day. °Eating fish at least twice a week. °Eating one serving each day of whole grains, beans, nuts, and seeds. °Using olive oil instead of other fats. °Limiting alcohol. °Limiting red meat. °Using nonfat or low-fat dairy products. °Consider following a plant-based diet. This includes dietary choices that focus on eating mostly vegetables and fruit, grains, beans, nuts, and seeds. °If you have high blood pressure, you may need to limit your sodium intake or follow a diet such as the DASH (Dietary Approaches to Stop Hypertension) eating  plan. The DASH diet aims to lower high blood pressure. °Lifestyle °Set weight loss goals with help from your health care team. It is recommended that most people with prediabetes lose 7% of their body weight. °Exercise for at least 30 minutes 5 or more days a week. °Attend a support group or seek support from a mental health counselor. °Take over-the-counter and prescription medicines only as told by your health care provider. °What foods are recommended? °Fruits °Berries. Bananas. Apples. Oranges. Grapes. Papaya. Mango. Pomegranate. Kiwi. Grapefruit. Cherries. °Vegetables °Lettuce. Spinach. Peas. Beets. Cauliflower. Cabbage. Broccoli. Carrots. Tomatoes. Squash. Eggplant. Herbs. Peppers. Onions. Cucumbers. Brussels sprouts. °Grains °Whole grains, such as whole-wheat or whole-grain breads, crackers, cereals, and pasta. Unsweetened oatmeal. Bulgur. Barley. Quinoa. Brown rice. Corn or whole-wheat flour tortillas or taco shells. °Meats and other proteins °Seafood. Poultry without skin. Lean cuts of pork and beef. Tofu. Eggs. Nuts. Beans. °Dairy °Low-fat or fat-free dairy products, such as yogurt, cottage cheese, and cheese. °Beverages °Water. Tea. Coffee. Sugar-free or diet soda. Seltzer water. Low-fat or nonfat milk. Milk alternatives, such as soy or almond milk. °Fats and oils °Olive oil. Canola oil. Sunflower oil. Grapeseed oil. Avocado. Walnuts. °Sweets and desserts °Sugar-free or low-fat pudding. Sugar-free or low-fat ice cream and other frozen treats. °Seasonings and condiments °Herbs. Sodium-free spices. Mustard. Relish. Low-salt, low-sugar ketchup. Low-salt, low-sugar barbecue sauce. Low-fat or fat-free mayonnaise. °The items listed above may not be a complete list of recommended foods and beverages. Contact a dietitian for more information. °What foods are not recommended? °Fruits °Fruits canned with syrup. °Vegetables °Canned vegetables. Frozen vegetables with butter or cream sauce. °Grains °Refined white  flour and flour   products, such as bread, pasta, snack foods, and cereals. °Meats and other proteins °Fatty cuts of meat. Poultry with skin. Breaded or fried meat. Processed meats. °Dairy °Full-fat yogurt, cheese, or milk. °Beverages °Sweetened drinks, such as iced tea and soda. °Fats and oils °Butter. Lard. Ghee. °Sweets and desserts °Baked goods, such as cake, cupcakes, pastries, cookies, and cheesecake. °Seasonings and condiments °Spice mixes with added salt. Ketchup. Barbecue sauce. Mayonnaise. °The items listed above may not be a complete list of foods and beverages that are not recommended. Contact a dietitian for more information. °Where to find more information °American Diabetes Association: www.diabetes.org °Summary °You may need to make diet and lifestyle changes to help prevent the onset of diabetes. These changes can help you control blood sugar, improve cholesterol levels, and manage blood pressure. °Set weight loss goals with help from your health care team. It is recommended that most people with prediabetes lose 7% of their body weight. °Consider following a Mediterranean diet. This includes eating plenty of fresh fruits and vegetables, whole grains, beans, nuts, seeds, fish, and low-fat dairy, and using olive oil instead of other fats. °This information is not intended to replace advice given to you by your health care provider. Make sure you discuss any questions you have with your health care provider. °Document Revised: 01/30/2020 Document Reviewed: 01/30/2020 °Elsevier Patient Education © 2022 Elsevier Inc. ° °

## 2021-09-29 NOTE — Progress Notes (Signed)
Subjective:  Patient ID: Cody Baird, male    DOB: [birth year]  Age: 60 y.o. MRN: [id number]  CC: Hypertension   HPI Cody Baird is a 60 y.o. year old male with a history of tobacco abuse (quit after 40 years), PAD s/p R LE bypass graft diagnosed with right MCA stroke in 01/2020 (he presented outside the window for TPA)  Interval History: He is concerned he is not able to lose weight. Endorses being sedentary and quitting smoking in addition has caused additional weight gain. Walking on the treadmill causes his hips to hurt. He has had back surgeries and worked in [employer] when he was much younger and he now feels everything is taking a toll on him.  L arm sometimes hurts and at other times pulls ever since his stroke and he does not need medications.  Compliant with his antihypertensive and his statin.  Sometimes has intermittent pain in his right leg when walking but is otherwise doing well. Past Medical History:  Diagnosis Date   Acute right MCA stroke ([place]) 01/30/2020   Aftercare following surgery of the circulatory system, NEC 01/14/2014   Arthritis    low back and left hip   Atherosclerosis of native arteries of the extremities with ulceration(440.23) 07/16/2013   Atherosclerotic peripheral vascular disease with rest pain ([place]) 10/30/2013   B12 deficiency    Bradycardia    Chronic back pain    CVA (cerebral vascular accident) ([place]) 01/28/2020   Essential hypertension 01/28/2020   Headache(784.0)    occasionally and sinus related    History of bronchitis    last time about 2-5yrs ago   Ischemic pain of foot 12/24/2013   PAD (peripheral artery disease) ([facility])    Pain in limb 06/25/2013   PBA (pseudobulbar affect)    Peripheral vascular disease ([place])    Peripheral vascular disease, unspecified ([place]) 06/25/2013   Post-op pain 09/17/2013   Post-operative pain 07/16/2013   Prolapsed, anus    hx of   PVD (peripheral vascular disease) ([place]) 09/17/2013   Stroke ([place]) 01/2020   TIA  (transient ischemic attack) 01/28/2020   Tobacco abuse 01/28/2020   Venous stasis ulcer of ankle, right ([place]) 02/17/2020    Past Surgical History:  Procedure Laterality Date   ABDOMINAL AORTAGRAM N/A 06/26/2013   Procedure: ABDOMINAL AORTAGRAM;  Surgeon: Rosetta Posner, MD;  Location: [facility] CATH LAB;  Service: Cardiovascular;  Laterality: N/A;   AMPUTATION Right 12/04/2013   Procedure: AMPUTATION DIGIT- 3RD TOE RIGHT FOOT ;  Surgeon: Rosetta Posner, MD;  Location: [facility];  Service: Vascular;  Laterality: Right;   AMPUTATION Right 01/15/2014   Procedure: AMPUTATION OF RIGHT 4TH and 5TH TOES;  Surgeon: Rosetta Posner, MD;  Location: [facility] OR;  Service: Vascular;  Laterality: Right;   BACK SURGERY     L1-2   BACK SURGERY     L1-2   BACK SURGERY     thinks L1- 2   COLON SURGERY     removal of colon due to proplase rectum   COLOSTOMY     COLOSTOMY CLOSURE  2006ish   ESOPHAGOGASTRODUODENOSCOPY     FEMORAL-TIBIAL BYPASS GRAFT Right 06/28/2013   Procedure: BYPASS GRAFT FEMORAL TO TIBIAL PERONEAL TRUNK ARTERY;  Surgeon: Rosetta Posner, MD;  Location: [facility] OR;  Service: Vascular;  Laterality: Right;    Family History  Problem Relation Age of Onset   Cancer Mother    Diabetes Mother    Diabetes Father  Hyperlipidemia Father    Hypertension Father    Heart attack Father     Allergies  Allergen Reactions   Penicillins Other (See Comments)    Childhood allergy not sure what the reaction is  Did it involve swelling of the face/tongue/throat, SOB, or low BP? Unknown Did it involve sudden or severe rash/hives, skin peeling, or any reaction on the inside of your mouth or nose? Unknown Did you need to seek medical attention at a hospital or doctor's office? Unknown When did it last happen?    childhood   If all above answers are "NO", may proceed with cephalosporin use.     Outpatient Medications Prior to Visit  Medication Sig Dispense Refill   aspirin 325 MG EC tablet Take 1 tablet (325 mg total) by mouth  daily. 100 tablet 1   amLODipine (NORVASC) 5 MG tablet TAKE 1 TABLET BY MOUTH ONCE DAILY 30 tablet 2   atorvastatin (LIPITOR) 40 MG tablet TAKE 1 TABLET BY MOUTH ONCE DAILY AT 6PM 30 tablet 3   No facility-administered medications prior to visit.     ROS Review of Systems  Constitutional:  Negative for activity change and appetite change.  HENT:  Negative for sinus pressure and sore throat.   Eyes:  Negative for visual disturbance.  Respiratory:  Negative for cough, chest tightness and shortness of breath.   Cardiovascular:  Negative for chest pain and leg swelling.  Gastrointestinal:  Negative for abdominal distention, abdominal pain, constipation and diarrhea.  Endocrine: Negative.   Genitourinary:  Negative for dysuria.  Musculoskeletal:        See HPI  Skin:  Negative for rash.  Allergic/Immunologic: Negative.   Neurological:  Negative for weakness, light-headedness and numbness.  Psychiatric/Behavioral:  Negative for dysphoric mood and suicidal ideas.    Objective:  BP 122/82   Pulse 73   Ht 6' (1.829 m)   Wt 262 lb 3.2 oz (118.9 kg)   SpO2 96%   BMI 35.56 kg/m   BP/Weight 09/29/2021 04/09/2021 01/11/2021  Systolic BP 122 112 117  Diastolic BP 82 70 77  Wt. (Lbs) 262.2 255.4 249  BMI 35.56 34.64 33.77      Physical Exam Constitutional:      Appearance: He is well-developed. He is obese.  Cardiovascular:     Rate and Rhythm: Normal rate.     Heart sounds: Normal heart sounds. No murmur heard. Pulmonary:     Effort: Pulmonary effort is normal.     Breath sounds: Normal breath sounds. No wheezing or rales.  Chest:     Chest wall: No tenderness.  Abdominal:     General: Bowel sounds are normal. There is distension.     Palpations: Abdomen is soft. There is no mass.     Tenderness: There is no abdominal tenderness.  Musculoskeletal:        General: Normal range of motion.     Right lower leg: No edema.     Left lower leg: No edema.  Neurological:      Mental Status: He is alert and oriented to person, place, and time.  Psychiatric:        Mood and Affect: Mood normal.    CMP Latest Ref Rng & Units 12/08/2020 02/05/2020 02/03/2020  Glucose 65 - 99 mg/dL 99 841(L) 244(W)  BUN 6 - 24 mg/dL 13 13 12   Creatinine 0.76 - 1.27 mg/dL 1.02 7.25  Sodium 134 - 144 mmol/L 138 137 138  Potassium 3.5 -  5.2 mmol/L 4.8 3.7 3.6  Chloride 96 - 106 mmol/L 100 102 102  CO2 20 - 29 mmol/L 25 25 26   Calcium 8.7 - 10.2 mg/dL 10.0 9.5 9.1  Total Protein 6.0 - 8.5 g/dL 7.5 - -  Total Bilirubin 0.0 - 1.2 mg/dL 0.4 - -  Alkaline Phos 44 - 121 IU/L 76 - -  AST 0 - 40 IU/L 22 - -  ALT 0 - 44 IU/L 32 - -    Lipid Panel     Component Value Date/Time   CHOL 143 12/08/2020 0954   TRIG 126 12/08/2020 0954   HDL 41 12/08/2020 0954   CHOLHDL 3.5 12/08/2020 0954   CHOLHDL 6.6 01/28/2020 0612   VLDL 31 01/28/2020 0612   LDLCALC 79 12/08/2020 0954    CBC    Component Value Date/Time   WBC 7.7 02/03/2020 0559   RBC 5.36 02/03/2020 0559   HGB 16.3 02/03/2020 0559   HCT 49.9 02/03/2020 0559   PLT 196 02/03/2020 0559   MCV 93.1 02/03/2020 0559   MCH 30.4 02/03/2020 0559   MCHC 32.7 02/03/2020 0559   RDW 12.9 02/03/2020 0559   LYMPHSABS 2.0 01/31/2020 0504   MONOABS 0.9 01/31/2020 0504   EOSABS 0.3 01/31/2020 0504   BASOSABS 0.0 01/31/2020 0504    Lab Results  Component Value Date   HGBA1C 6.0 09/29/2021    Assessment & Plan:  1. Essential hypertension Controlled Counseled on blood pressure goal of less than 130/80, low-sodium, DASH diet, medication compliance, 150 minutes of moderate intensity exercise per week. Discussed medication compliance, adverse effects. - amLODipine (NORVASC) 5 MG tablet; Take 1 tablet (5 mg total) by mouth daily.  Dispense: 90 tablet; Refill: 1 - atorvastatin (LIPITOR) 40 MG tablet; Take 1 tablet (40 mg total) by mouth daily.  Dispense: 90 tablet; Refill: 1 - Basic Metabolic Panel  2. Prediabetes A1c of 6.0 from  5.6 Counseled on lifestyle modifications to prevent progression to type 2 diabetes mellitus  3. Peripheral vascular disease, unspecified ([place]) Status post right lower extremity bypass graft Risk factor modification Continue statin - atorvastatin (LIPITOR) 40 MG tablet; Take 1 tablet (40 mg total) by mouth daily.  Dispense: 90 tablet; Refill: 1  4. Screening for colon cancer - Fecal occult blood, imunochemical([facility])    Meds ordered this encounter  Medications   amLODipine (NORVASC) 5 MG tablet    Sig: Take 1 tablet (5 mg total) by mouth daily.    Dispense:  90 tablet    Refill:  1    Please dispense 90-day supply   atorvastatin (LIPITOR) 40 MG tablet    Sig: Take 1 tablet (40 mg total) by mouth daily.    Dispense:  90 tablet    Refill:  1    Please dispense 90-day supply     Follow-up: Return in about 6 months (around 03/29/2022) for Chronic medical conditions.       Charlott Rakes, MD, FAAFP. [facility], [place]   09/29/2021, 4:59 PM

## 2021-09-30 LAB — BASIC METABOLIC PANEL
BUN/Creatinine Ratio: 9 (ref 9–20)
BUN: 10 mg/dL (ref 6–24)
CO2: 23 mmol/L (ref 20–29)
Calcium: 9.6 mg/dL (ref 8.7–10.2)
Chloride: 101 mmol/L (ref 96–106)
Creatinine, Ser: 1.06 mg/dL (ref 0.76–1.27)
Glucose: 88 mg/dL (ref 70–99)
Potassium: 4.4 mmol/L (ref 3.5–5.2)
Sodium: 140 mmol/L (ref 134–144)
eGFR: 81 mL/min/{1.73_m2} (ref >59)

## 2021-10-01 ENCOUNTER — Telehealth: Payer: Self-pay

## 2021-10-01 NOTE — Telephone Encounter (Signed)
-----   Message from Enobong Newlin, MD sent at 09/30/2021 12:12 PM EST ----- Please inform the patient that labs are normal. Thank you. 

## 2021-10-01 NOTE — Telephone Encounter (Signed)
Patient name and DOB has been verified Patient was informed of lab results. Patient had no questions.  

## 2022-03-29 ENCOUNTER — Ambulatory Visit: Payer: Self-pay | Admitting: Family Medicine

## 2022-05-13 ENCOUNTER — Other Ambulatory Visit: Payer: Self-pay | Admitting: Family Medicine

## 2022-05-13 DIAGNOSIS — I10 Essential (primary) hypertension: Secondary | ICD-10-CM

## 2022-05-13 DIAGNOSIS — I739 Peripheral vascular disease, unspecified: Secondary | ICD-10-CM

## 2022-05-13 MED ORDER — AMLODIPINE BESYLATE 5 MG PO TABS: 5.0000 mg | ORAL_TABLET | Freq: Every day | ORAL | 0 refills | Status: DC

## 2022-05-13 NOTE — Telephone Encounter (Signed)
Requested Prescriptions  Pending Prescriptions Disp Refills  . amLODipine (NORVASC) 5 MG tablet 90 tablet 0    Sig: Take 1 tablet (5 mg total) by mouth daily.     Cardiovascular: Calcium Channel Blockers 2 Failed - 05/13/2022 12:45 PM      Failed - Valid encounter within last 6 months    Recent Outpatient Visits          7 months ago Essential hypertension   [facility] Hoy Register, MD   1 year ago Essential hypertension   [place] Hoy Register, MD   1 year ago Neuropathy   [facility] Hoy Register, MD   2 years ago Acute right MCA stroke [facility])   [facility] Hoy Register, MD      Future Appointments            In 1 month Munley, Iline Oven, MD [facility] [place]   In 2 months Hoy Register, MD [facility]           Passed - Last BP in normal range    BP Readings from Last 1 Encounters:  09/29/21 122/82         Passed - Last Heart Rate in normal range    Pulse Readings from Last 1 Encounters:  09/29/21 73         . atorvastatin (LIPITOR) 40 MG tablet 90 tablet     Sig: Take 1 tablet (40 mg total) by mouth daily.     Cardiovascular:  Antilipid - Statins Failed - 05/13/2022 12:45 PM      Failed - Lipid Panel in normal range within the last 12 months    Cholesterol, Total  Date Value Ref Range Status  12/08/2020 143 100 - 199 mg/dL Final   LDL Chol Calc (NIH)  Date Value Ref Range Status  12/08/2020 79 0 - 99 mg/dL Final   HDL  Date Value Ref Range Status  12/08/2020 41 >39 mg/dL Final   Triglycerides  Date Value Ref Range Status  12/08/2020 126 0 - 149 mg/dL Final         Passed - Patient is not pregnant      Passed - Valid encounter within last 12 months    Recent Outpatient Visits          7 months ago Essential hypertension   [facility] [place],  Odette Horns, MD   1 year ago Essential hypertension   [facility] [place], Odette Horns, MD   1 year ago Neuropathy   [place] Hoy Register, MD   2 years ago Acute right MCA stroke [facility])    [facility] Hoy Register, MD      Future Appointments            In 1 month Munley, Iline Oven, MD [facility] [place]   In 2 months Hoy Register, MD [facility]

## 2022-05-13 NOTE — Telephone Encounter (Signed)
Requested medication (s) are due for refill today: yes  Requested medication (s) are on the active medication list: yes  Last refill:  09/29/22 #90 1 RF  Future visit scheduled: yes  Notes to clinic:  over due labs > 1 year   Requested Prescriptions  Pending Prescriptions Disp Refills   atorvastatin (LIPITOR) 40 MG tablet 90 tablet     Sig: Take 1 tablet (40 mg total) by mouth daily.     Cardiovascular:  Antilipid - Statins Failed - 05/13/2022 12:45 PM      Failed - Lipid Panel in normal range within the last 12 months    Cholesterol, Total  Date Value Ref Range Status  12/08/2020 143 100 - 199 mg/dL Final   LDL Chol Calc (NIH)  Date Value Ref Range Status  12/08/2020 79 0 - 99 mg/dL Final   HDL  Date Value Ref Range Status  12/08/2020 41 >39 mg/dL Final   Triglycerides  Date Value Ref Range Status  12/08/2020 126 0 - 149 mg/dL Final         Passed - Patient is not pregnant      Passed - Valid encounter within last 12 months    Recent Outpatient Visits           7 months ago Essential hypertension   [facility] [place], Odette Horns, MD   1 year ago Essential hypertension   [facility] [place], Odette Horns, MD   1 year ago Neuropathy   [facility] [place], Odette Horns, MD   2 years ago Acute right MCA stroke [facility])   [place] [facility] Hoy Register, MD       Future Appointments             In 1 month Ireton, Iline Oven, MD [facility] [place]   In 2 months Hoy Register, MD [facility]            Signed Prescriptions Disp Refills   amLODipine (NORVASC) 5 MG tablet 90 tablet 0    Sig: Take 1 tablet (5 mg total) by mouth daily.     Cardiovascular: Calcium Channel Blockers 2 Failed - 05/13/2022 12:45 PM      Failed - Valid encounter within last 6 months    Recent Outpatient Visits           7 months ago Essential  hypertension   [place] [facility] Hoy Register, MD   1 year ago Essential hypertension   [place] Hoy Register, MD   1 year ago Neuropathy   [place] [facility] Hoy Register, MD   2 years ago Acute right MCA stroke [facility])    [facility] Hoy Register, MD       Future Appointments             In 1 month Munley, Iline Oven, MD [facility] at [place]   In 2 months Hoy Register, MD [facility]            Passed - Last BP in normal range    BP Readings from Last 1 Encounters:  09/29/21 122/82         Passed - Last Heart Rate in normal range    Pulse Readings from Last 1 Encounters:  09/29/21 73

## 2022-05-13 NOTE — Telephone Encounter (Signed)
Medication Refill - Medication: amLODipine (NORVASC) 5 MG tablet and atorvastatin (LIPITOR) 40 MG tablet  Has the patient contacted their pharmacy? Yes.   Pt told to contact provider  Preferred Pharmacy (with phone number or street name):  [facility] [address] - [place], [place] - [address] Phone:  [phone]  Fax:  [phone]     Has the patient been seen for an appointment in the last year OR does the patient have an upcoming appointment? Yes.    Agent: Please be advised that RX refills may take up to 3 business days. We ask that you follow-up with your pharmacy.

## 2022-05-30 MED ORDER — ATORVASTATIN CALCIUM 40 MG PO TABS: 40.0000 mg | ORAL_TABLET | Freq: Every day | ORAL | 0 refills | Status: DC

## 2022-06-16 ENCOUNTER — Ambulatory Visit (INDEPENDENT_AMBULATORY_CARE_PROVIDER_SITE_OTHER): Payer: Self-pay | Admitting: Cardiology

## 2022-06-16 ENCOUNTER — Encounter: Payer: Self-pay | Admitting: Cardiology

## 2022-06-16 VITALS — BP 106/64 | HR 69 | Ht 72.0 in | Wt 254.6 lb

## 2022-06-16 DIAGNOSIS — I639 Cerebral infarction, unspecified: Secondary | ICD-10-CM

## 2022-06-16 DIAGNOSIS — E782 Mixed hyperlipidemia: Secondary | ICD-10-CM

## 2022-06-16 DIAGNOSIS — I10 Essential (primary) hypertension: Secondary | ICD-10-CM

## 2022-06-16 MED ORDER — ASPIRIN 81 MG PO TBEC: 81.0000 mg | DELAYED_RELEASE_TABLET | Freq: Every day | ORAL | 3 refills | Status: DC

## 2022-06-16 NOTE — Progress Notes (Signed)
Cardiology Office Note:    Date:  06/16/2022   ID:  Cody Baird, DOB [birth year], MRN [id number]  PCP:  Hoy Register, MD  Cardiologist:  Norman Herrlich, MD    Referring MD: Hoy Register, MD    ASSESSMENT:    1. Cerebrovascular accident (CVA), unspecified mechanism (HCC)   2. Essential hypertension   3. Mixed hyperlipidemia    PLAN:    In order of problems listed above:  I reviewed with [name] that unfortunately atrial fibrillation can be subclinical and can cause stroke and can be difficult to document and detect.  I think he would benefit from either an implanted loop recorder or wearing a smart watch Fitbit or Apple to pair with his phone or by in the mobile cardia device to screen himself sporadically and if atrial fibrillation is documented he require lifelong anticoagulation.  He said he would look into getting the Apple Watch.  For now continue aspirin but reduce dose to avoid bleeding complications Blood pressure is well controlled continue his calcium channel blocker Continue his statin as controlled his lipid disorder   Next appointment: 1 year   Medication Adjustments/Labs and Tests Ordered: Current medicines are reviewed at length with the patient today.  Concerns regarding medicines are outlined above.  No orders of the defined types were placed in this encounter.  No orders of the defined types were placed in this encounter.   Chief Complaint  Patient presents with   Follow-up    He has a history of stroke and previously evaluated in May 2022    History of Present Illness:    Cody Baird is a 61 y.o. male with a hx of stroke peripheral vascular disease hypertension and hyperlipidemia last seen 04/09/2021 previous evaluation with a 2-week event monitor showed rare ventricular and supraventricular ectopy and no episodes of atrial fibrillation his echocardiogram showed normal left ventricular size structure EF 60 to 65% and no cardiac findings of source of  embolism of stroke he was advised home rhythm monitoring with the mobile Kardia device. Compliance with diet, lifestyle and medications: Yes  He is made a good recovery from his stroke with the exception of some trouble with balance and gait No recurrent neurologic event no palpitation or syncope He did not purchase a heart monitor.  I strongly encouraged him to purchase the Apple Watch to bear with his phone for atrial fibrillation detection or the mobile cardia device and screen himself daily.  He said he would buy the lower-level [place] Apple watch. His lipids are controlled continue his statin He takes high-dose aspirin I told there is no clinical benefit greater bleeding including intracranial and I asked him to reduce to 81 mg/day Past Medical History:  Diagnosis Date   Acute right MCA stroke (HCC) 01/30/2020   Aftercare following surgery of the circulatory system, NEC 01/14/2014   Arthritis    low back and left hip   Atherosclerosis of native arteries of the extremities with ulceration(440.23) 07/16/2013   Atherosclerotic peripheral vascular disease with rest pain (HCC) 10/30/2013   B12 deficiency    Bradycardia    Chronic back pain    CVA (cerebral vascular accident) (HCC) 01/28/2020   Essential hypertension 01/28/2020   History of bronchitis    last time about 2-68yrs ago   Ischemic pain of foot 12/24/2013   PAD (peripheral artery disease) (HCC)    Pain in limb 06/25/2013   PBA (pseudobulbar affect)    Peripheral vascular disease (HCC)  Peripheral vascular disease, unspecified (HCC) 06/25/2013   Post-op pain 09/17/2013   Post-operative pain 07/16/2013   Prolapsed, anus    hx of   PVD (peripheral vascular disease) (HCC) 09/17/2013   Stroke (HCC) 01/2020   TIA (transient ischemic attack) 01/28/2020   Tobacco abuse 01/28/2020   Venous stasis ulcer of ankle, right (HCC) 02/17/2020    Past Surgical History:  Procedure Laterality Date   ABDOMINAL AORTAGRAM N/A 06/26/2013    Procedure: ABDOMINAL AORTAGRAM;  Surgeon: Larina Earthly, MD;  Location: [facility] CATH LAB;  Service: Cardiovascular;  Laterality: N/A;   AMPUTATION Right 12/04/2013   Procedure: AMPUTATION DIGIT- 3RD TOE RIGHT FOOT ;  Surgeon: Larina Earthly, MD;  Location: [facility] OR;  Service: Vascular;  Laterality: Right;   AMPUTATION Right 01/15/2014   Procedure: AMPUTATION OF RIGHT 4TH and 5TH TOES;  Surgeon: Larina Earthly, MD;  Location: [facility] OR;  Service: Vascular;  Laterality: Right;   BACK SURGERY     L1-2   BACK SURGERY     L1-2   BACK SURGERY     thinks L1- 2   COLON SURGERY     removal of colon due to proplase rectum   COLOSTOMY     COLOSTOMY CLOSURE  2006ish   ESOPHAGOGASTRODUODENOSCOPY     FEMORAL-TIBIAL BYPASS GRAFT Right 06/28/2013   Procedure: BYPASS GRAFT FEMORAL TO TIBIAL PERONEAL TRUNK ARTERY;  Surgeon: Larina Earthly, MD;  Location[facility] OR;  Service: Vascular;  Laterality: Right;    Current Medications: Current Meds  Medication Sig   amLODipine (NORVASC) 5 MG tablet Take 1 tablet (5 mg total) by mouth daily.   aspirin 325 MG EC tablet Take 1 tablet (325 mg total) by mouth daily.   atorvastatin (LIPITOR) 40 MG tablet Take 1 tablet (40 mg total) by mouth daily.     Allergies:   Penicillins   Social History   Socioeconomic History   Marital status: Divorced    Spouse name: Not on file   Number of children: Not on file   Years of education: Not on file   Highest education level: Not on file  Occupational History   Not on file  Tobacco Use   Smoking status: Former    Packs/day: 0.50    Years: 36.00    Total pack years: 18.00    Types: Cigarettes    Quit date: 01/27/2020    Years since quitting: 2.3   Smokeless tobacco: Never  Substance and Sexual Activity   Alcohol use: No    Alcohol/week: 0.0 standard drinks of alcohol   Drug use: No   Sexual activity: Not on file  Other Topics Concern   Not on file  Social History Narrative   Not on file   Social Determinants of Health    Financial Resource Strain: Not on file  Food Insecurity: Not on file  Transportation Needs: Not on file  Physical Activity: Not on file  Stress: Not on file  Social Connections: Not on file     Family History: The patient's family history includes Cancer in his mother; Diabetes in his father and mother; Heart attack in his father; Hyperlipidemia in his father; Hypertension in his father. ROS:   Please see the history of present illness.    All other systems reviewed and are negative.  EKGs/Labs/Other Studies Reviewed:    The following studies were reviewed today:  EKG:  EKG ordered today and personally reviewed.  The ekg ordered today demonstrates sinus rhythm interpreted  as right bundle branch block but he does not have that pattern right axis deviation.  Recent Labs: 09/29/2021: BUN 10; Creatinine, Ser 1.06; Potassium 4.4; Sodium 140  Recent Lipid Panel    Component Value Date/Time   CHOL 143 12/08/2020 0954   TRIG 126 12/08/2020 0954   HDL 41 12/08/2020 0954   CHOLHDL 3.5 12/08/2020 0954   CHOLHDL 6.6 01/28/2020 0612   VLDL 31 01/28/2020 0612   LDLCALC 79 12/08/2020 0954    Physical Exam:    VS:  BP 106/64   Pulse 69   Ht 6' (1.829 m)   Wt 254 lb 9.6 oz (115.5 kg)   SpO2 96%   BMI 34.53 kg/m     Wt Readings from Last 3 Encounters:  06/16/22 254 lb 9.6 oz (115.5 kg)  09/29/21 262 lb 3.2 oz (118.9 kg)  04/09/21 255 lb 6.4 oz (115.8 kg)     GEN:  Well nourished, well developed in no acute distress HEENT: Normal NECK: No JVD; No carotid bruits LYMPHATICS: No lymphadenopathy CARDIAC: RRR, no murmurs, rubs, gallops RESPIRATORY:  Clear to auscultation without rales, wheezing or rhonchi  ABDOMEN: Soft, non-tender, non-distended MUSCULOSKELETAL:  No edema; No deformity  SKIN: Warm and dry NEUROLOGIC:  Alert and oriented x 3 PSYCHIATRIC:  Normal affect    Signed, Norman Herrlich, MD  06/16/2022 11:24 AM    [facility]

## 2022-06-16 NOTE — Patient Instructions (Addendum)
Medication Instructions:  Your physician has recommended you make the following change in your medication:   START: Aspirin 81 mg daily  *If you need a refill on your cardiac medications before your next appointment, please call your pharmacy*   Lab Work: None If you have labs (blood work) drawn today and your tests are completely normal, you will receive your results only by: MyChart Message (if you have MyChart) OR A paper copy in the mail If you have any lab test that is abnormal or we need to change your treatment, we will call you to review the results.   Testing/Procedures: None   Follow-Up: At [facility], you and your health needs are our priority.  As part of our continuing mission to provide you with exceptional heart care, we have created designated Provider Care Teams.  These Care Teams include your primary Cardiologist (physician) and Advanced Practice Providers (APPs -  Physician Assistants and Nurse Practitioners) who all work together to provide you with the care you need, when you need it.  We recommend signing up for the patient portal called "MyChart".  Sign up information is provided on this After Visit Summary.  MyChart is used to connect with patients for Virtual Visits (Telemedicine).  Patients are able to view lab/test results, encounter notes, upcoming appointments, etc.  Non-urgent messages can be sent to your provider as well.   To learn more about what you can do with MyChart, go to [email or web address].    Your next appointment:   1 year(s)  The format for your next appointment:   In Person  Provider:   Norman Herrlich, MD    Other Instructions Purchase an Apple watch SE or FitBit to screen for A-fib.  Important Information About Sugar          KardiaMobile [email or web address]        FDA-cleared, clinical grade mobile EKG monitor: Lourena Simmonds is the most clinically-validated mobile EKG used by the world's  leading cardiac care medical professionals With Basic service, know instantly if your heart rhythm is normal or if atrial fibrillation is detected, and email the last single EKG recording to yourself or your doctor Premium service, available for purchase through the Kardia app for $9.99 per month or $99 per year, includes unlimited history and storage of your EKG recordings, a monthly EKG summary report to share with your doctor, along with the ability to track your blood pressure, activity and weight Includes one KardiaMobile phone clip FREE SHIPPING: Standard delivery 1-3 business days. Orders placed by 11:00am PST will ship that afternoon. Otherwise, will ship next business day. All orders ship via [employer] from [place], [place]

## 2022-06-29 ENCOUNTER — Other Ambulatory Visit: Payer: Self-pay | Admitting: Family Medicine

## 2022-06-29 DIAGNOSIS — I739 Peripheral vascular disease, unspecified: Secondary | ICD-10-CM

## 2022-06-29 DIAGNOSIS — I10 Essential (primary) hypertension: Secondary | ICD-10-CM

## 2022-06-29 MED ORDER — ATORVASTATIN CALCIUM 40 MG PO TABS: 40.0000 mg | ORAL_TABLET | Freq: Every day | ORAL | 0 refills | Status: DC

## 2022-06-29 NOTE — Telephone Encounter (Signed)
Medication Refill - Medication: atorvastatin (LIPITOR) 40 MG tablet  Pt stated 2-3 tablets left. Pt asked if it was possible for him to get a 90-day as its easier for him.  Pt has an upcoming appointment at the end of the month.   Has the patient contacted their pharmacy? No. (Agent: If no, request that the patient contact the pharmacy for the refill. If patient does not wish to contact the pharmacy document the reason why and proceed with request.)   Preferred Pharmacy (with phone number or street name):  [facility] [address] - [place], [place] - [address]  [address] [place] [address]  Phone: [phone] Fax: [phone]  Hours: Not open 24 hours   Has the patient been seen for an appointment in the last year OR does the patient have an upcoming appointment? Yes.    Agent: Please be advised that RX refills may take up to 3 business days. We ask that you follow-up with your pharmacy.

## 2022-06-29 NOTE — Telephone Encounter (Signed)
Requested Prescriptions  Pending Prescriptions Disp Refills  . atorvastatin (LIPITOR) 40 MG tablet 90 tablet 0    Sig: Take 1 tablet (40 mg total) by mouth daily.     Cardiovascular:  Antilipid - Statins Failed - 06/29/2022 10:30 AM      Failed - Lipid Panel in normal range within the last 12 months    Cholesterol, Total  Date Value Ref Range Status  12/08/2020 143 100 - 199 mg/dL Final   LDL Chol Calc (NIH)  Date Value Ref Range Status  12/08/2020 79 0 - 99 mg/dL Final   HDL  Date Value Ref Range Status  12/08/2020 41 >39 mg/dL Final   Triglycerides  Date Value Ref Range Status  12/08/2020 126 0 - 149 mg/dL Final         Passed - Patient is not pregnant      Passed - Valid encounter within last 12 months    Recent Outpatient Visits          9 months ago Essential hypertension   [facility] [place], Odette Horns, MD   1 year ago Essential hypertension   [facility] [place], Odette Horns, MD   2 years ago Neuropathy   [place] [place], Odette Horns, MD   2 years ago Acute right MCA stroke [facility])   [place] [facility] Hoy Register, MD      Future Appointments            In 2 weeks Hoy Register, MD [facility]

## 2022-07-13 ENCOUNTER — Ambulatory Visit: Payer: Self-pay | Attending: Family Medicine | Admitting: Family Medicine

## 2022-07-13 ENCOUNTER — Encounter: Payer: Self-pay | Admitting: Family Medicine

## 2022-07-13 VITALS — BP 142/77 | HR 61 | Temp 98.1°F | Ht 72.0 in | Wt 255.6 lb

## 2022-07-13 DIAGNOSIS — I739 Peripheral vascular disease, unspecified: Secondary | ICD-10-CM

## 2022-07-13 DIAGNOSIS — R7303 Prediabetes: Secondary | ICD-10-CM

## 2022-07-13 DIAGNOSIS — F17210 Nicotine dependence, cigarettes, uncomplicated: Secondary | ICD-10-CM

## 2022-07-13 DIAGNOSIS — I10 Essential (primary) hypertension: Secondary | ICD-10-CM

## 2022-07-13 LAB — POCT GLYCOSYLATED HEMOGLOBIN (HGB A1C): HbA1c, POC (controlled diabetic range): 6 % (ref 0.0–7.0)

## 2022-07-13 MED ORDER — ATORVASTATIN CALCIUM 40 MG PO TABS: 40.0000 mg | ORAL_TABLET | Freq: Every day | ORAL | 1 refills | Status: DC

## 2022-07-13 MED ORDER — AMLODIPINE BESYLATE 5 MG PO TABS: 5.0000 mg | ORAL_TABLET | Freq: Every day | ORAL | 1 refills | Status: DC

## 2022-07-13 NOTE — Progress Notes (Signed)
Subjective:  Patient ID: Cody Baird, male    DOB: [birth year]  Age: 61 y.o. MRN: [id number]  CC: Hypertension   HPI Cody Baird is a 61 y.o. year old male with a history of  tobacco abuse (quit after 40 years of smoking greater than 20-pack-year), PAD s/p R LE bypass graft diagnosed with right MCA stroke in 01/2020 (he presented outside the window for TPA), status post amputation of right lateral 3 toes prediabetes (A1c 6.0).  Interval History:  BP is slightly elevated and he states he has been stressed today. 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  At his last cardiology visit his blood pressure was controlled. He endorses adherence with his amlodipine. He also takes his statin regularly as well as his aspirin.  He has no claudication pains.  He does sometimes have problems with his balance due to his amputation in his right foot.  For his prediabetes he is on diet control. Denies additional concerns today. Past Medical History:  Diagnosis Date   Acute right MCA stroke ([place]) 01/30/2020   Aftercare following surgery of the circulatory system, NEC 01/14/2014   Arthritis    low back and left hip   Atherosclerosis of native arteries of the extremities with ulceration(440.23) 07/16/2013   Atherosclerotic peripheral vascular disease with rest pain ([place]) 10/30/2013   B12 deficiency    Bradycardia    Chronic back pain    CVA (cerebral vascular accident) ([place]) 01/28/2020   Essential hypertension 01/28/2020   History of bronchitis    last time about 2-44yr ago   Ischemic pain of foot 12/24/2013   PAD (peripheral artery disease) (HCC)    Pain in limb 06/25/2013   PBA (pseudobulbar affect)    Peripheral vascular disease ([place]    Peripheral vascular disease, unspecified ([place] 06/25/2013   Post-op pain 09/17/2013   Post-operative pain 07/16/2013   Prolapsed, anus    hx of   PVD (peripheral vascular disease) ([place] 09/17/2013   Stroke  ([facility] 01/2020   TIA (transient ischemic attack) 01/28/2020   Tobacco abuse 01/28/2020   Venous stasis ulcer of ankle, right ([place] 02/17/2020    Past Surgical History:  Procedure Laterality Date   ABDOMINAL AORTAGRAM N/A 06/26/2013   Procedure: ABDOMINAL AORTAGRAM;  Surgeon: TRosetta Posner MD;  [facility];  Service: Cardiovascular;  Laterality: N/A;   AMPUTATION Right 12/04/2013   Procedure: AMPUTATION DIGIT- 3RD TOE RIGHT FOOT ;  Surgeon: TRosetta Posner MD;  [place]  Service: Vascular;  Laterality: Right;   AMPUTATION Right 01/15/2014   Procedure: AMPUTATION OF RIGHT 4TH and 5TH TOES;  Surgeon: TRosetta Posner MD;  [facility];  Service: Vascular;  Laterality: Right;   BACK SURGERY     L1-2   BACK SURGERY     L1-2   BACK SURGERY     thinks L1- 2   COLON SURGERY     removal of colon due to proplase rectum   COLOSTOMY     COLOSTOMY CLOSURE  2006ish   ESOPHAGOGASTRODUODENOSCOPY     FEMORAL-TIBIAL BYPASS GRAFT Right 06/28/2013   Procedure: BYPASS GRAFT FEMORAL TO TIBIAL PERONEAL TRUNK ARTERY;  Surgeon: TRosetta Posner MD;  [facility];  Service: Vascular;  Laterality: Right;    Family History  Problem Relation Age of Onset   Cancer Mother    Diabetes Mother    Diabetes Father  Hyperlipidemia Father    Hypertension Father    Heart attack Father     Social History   Socioeconomic History   Marital status: Divorced    Spouse name: Not on file   Number of children: Not on file   Years of education: Not on file   Highest education level: Not on file  Occupational History   Not on file  Tobacco Use   Smoking status: Former    Packs/day: 0.50    Years: 36.00    Total pack years: 18.00    Types: Cigarettes    Quit date: 01/27/2020    Years since quitting: 2.4   Smokeless tobacco: Never  Substance and Sexual Activity   Alcohol use: No    Alcohol/week: 0.0 standard drinks of alcohol   Drug use: No   Sexual activity: Not on file  Other Topics Concern    Not on file  Social History Narrative   Not on file   Social Determinants of Health   Financial Resource Strain: Not on file  Food Insecurity: Not on file  Transportation Needs: Not on file  Physical Activity: Not on file  Stress: Not on file  Social Connections: Not on file    Allergies  Allergen Reactions   Penicillins Other (See Comments)    Childhood allergy not sure what the reaction is  Did it involve swelling of the face/tongue/throat, SOB, or low BP? Unknown Did it involve sudden or severe rash/hives, skin peeling, or any reaction on the inside of your mouth or nose? Unknown Did you need to seek medical attention at a hospital or doctor's office? Unknown When did it last happen?    childhood   If all above answers are "NO", may proceed with cephalosporin use.     Outpatient Medications Prior to Visit  Medication Sig Dispense Refill   aspirin EC 81 MG tablet Take 1 tablet (81 mg total) by mouth daily. Swallow whole. 90 tablet 3   amLODipine (NORVASC) 5 MG tablet Take 1 tablet (5 mg total) by mouth daily. 90 tablet 0   atorvastatin (LIPITOR) 40 MG tablet Take 1 tablet (40 mg total) by mouth daily. 90 tablet 0   No facility-administered medications prior to visit.     ROS Review of Systems  Constitutional:  Negative for activity change and appetite change.  HENT:  Negative for sinus pressure and sore throat.   Respiratory:  Negative for chest tightness, shortness of breath and wheezing.   Cardiovascular:  Negative for chest pain and palpitations.  Gastrointestinal:  Negative for abdominal distention, abdominal pain and constipation.  Genitourinary: Negative.   Musculoskeletal: Negative.   Psychiatric/Behavioral:  Negative for behavioral problems and dysphoric mood.     Objective:  BP (!) 142/77   Pulse 61   Temp 98.1 F (36.7 C) (Oral)   Ht 6' (1.829 m)   Wt 255 lb 9.6 oz (115.9 kg)   SpO2 97%   BMI 34.67 kg/m      07/13/2022    3:14 PM 06/16/2022    11:02 AM 09/29/2021    1:44 PM  BP/Weight  Systolic BP 440 347 425  Diastolic BP 77 64 82  Wt. (Lbs) 255.6 254.6 262.2  BMI 34.67 kg/m2 34.53 kg/m2 35.56 kg/m2      Physical Exam Constitutional:      Appearance: He is well-developed.  Cardiovascular:     Rate and Rhythm: Normal rate.     Heart sounds: Normal heart sounds. No murmur heard.  Pulmonary:     Effort: Pulmonary effort is normal.     Breath sounds: Normal breath sounds. No wheezing or rales.  Chest:     Chest wall: No tenderness.  Abdominal:     General: Bowel sounds are normal. There is no distension.     Palpations: Abdomen is soft. There is no mass.     Tenderness: There is no abdominal tenderness.  Musculoskeletal:        General: Normal range of motion.     Right lower leg: No edema.     Left lower leg: No edema.  Neurological:     Mental Status: He is alert and oriented to person, place, and time.  Psychiatric:        Mood and Affect: Mood normal.        Latest Ref Rng & Units 09/29/2021    2:19 PM 12/08/2020    9:54 AM 02/05/2020   10:28 AM  CMP  Glucose 70 - 99 mg/dL 88  99  109   BUN 6 - 24 mg/dL '10  13  13   ' Creatinine 0.76 - 1.27 mg/dL 1.06  1.02  0.99   Sodium 134 - 144 mmol/L 140  138  137   Potassium 3.5 - 5.2 mmol/L 4.4  4.8  3.7   Chloride 96 - 106 mmol/L 101  100  102   CO2 20 - 29 mmol/L '23  25  25   ' Calcium 8.7 - 10.2 mg/dL 9.6  10.0  9.5   Total Protein 6.0 - 8.5 g/dL  7.5    Total Bilirubin 0.0 - 1.2 mg/dL  0.4    Alkaline Phos 44 - 121 IU/L  76    AST 0 - 40 IU/L  22    ALT 0 - 44 IU/L  32      Lipid Panel     Component Value Date/Time   CHOL 143 12/08/2020 0954   TRIG 126 12/08/2020 0954   HDL 41 12/08/2020 0954   CHOLHDL 3.5 12/08/2020 0954   CHOLHDL 6.6 01/28/2020 0612   VLDL 31 01/28/2020 0612   LDLCALC 79 12/08/2020 0954    CBC    Component Value Date/Time   WBC 7.7 02/03/2020 0559   RBC 5.36 02/03/2020 0559   HGB 16.3 02/03/2020 0559   HCT 49.9 02/03/2020  0559   PLT 196 02/03/2020 0559   MCV 93.1 02/03/2020 0559   MCH 30.4 02/03/2020 0559   MCHC 32.7 02/03/2020 0559   RDW 12.9 02/03/2020 0559   LYMPHSABS 2.0 01/31/2020 0504   MONOABS 0.9 01/31/2020 0504   EOSABS 0.3 01/31/2020 0504   BASOSABS 0.0 01/31/2020 0504    Lab Results  Component Value Date   HGBA1C 6.0 07/13/2022    Assessment & Plan:  1. Prediabetes Labs reveal prediabetes with an A1c of 6.0.  Working on a low carbohydrate diet, exercise, weight loss is recommended in order to prevent progression to type 2 diabetes mellitus.  - POCT glycosylated hemoglobin (Hb A1C)  2. Essential hypertension Lightly above goal No regimen change today given blood pressure was 106/63 at cardiology visit 3 weeks ago Counseled on blood pressure goal of less than 130/80, low-sodium, DASH diet, medication compliance, 150 minutes of moderate intensity exercise per week. Discussed medication compliance, adverse effects. - amLODipine (NORVASC) 5 MG tablet; Take 1 tablet (5 mg total) by mouth daily.  Dispense: 90 tablet; Refill: 1 - CMP14+EGFR; Future - LP+Non-HDL Cholesterol; Future - atorvastatin (LIPITOR) 40 MG tablet;  Take 1 tablet (40 mg total) by mouth daily.  Dispense: 90 tablet; Refill: 1  3. Smoking greater than 20 pack years He has quit smoking but does qualify for lung cancer screening - CT CHEST LUNG CANCER SCREENING LOW DOSE WO CONTRAST; Future  4. Peripheral vascular disease, unspecified ([place]) Status post right lower extremity bypass graft Asymptomatic - atorvastatin (LIPITOR) 40 MG tablet; Take 1 tablet (40 mg total) by mouth daily.  Dispense: 90 tablet; Refill: 1    Meds ordered this encounter  Medications   amLODipine (NORVASC) 5 MG tablet    Sig: Take 1 tablet (5 mg total) by mouth daily.    Dispense:  90 tablet    Refill:  1   atorvastatin (LIPITOR) 40 MG tablet    Sig: Take 1 tablet (40 mg total) by mouth daily.    Dispense:  90 tablet    Refill:  1     Follow-up: Return in about 6 months (around 01/12/2023) for Chronic medical conditions.       Charlott Rakes, MD, FAAFP. [facility] and [place], [place]   07/13/2022, 5:24 PM

## 2022-07-13 NOTE — Patient Instructions (Signed)

## 2022-07-19 ENCOUNTER — Ambulatory Visit (HOSPITAL_COMMUNITY): Payer: Self-pay

## 2023-01-16 ENCOUNTER — Ambulatory Visit: Payer: Self-pay | Attending: Family Medicine | Admitting: Family Medicine

## 2023-01-16 ENCOUNTER — Encounter: Payer: Self-pay | Admitting: Family Medicine

## 2023-01-16 VITALS — BP 131/76 | HR 68 | Temp 98.0°F | Ht 72.0 in | Wt 240.0 lb

## 2023-01-16 DIAGNOSIS — Z1211 Encounter for screening for malignant neoplasm of colon: Secondary | ICD-10-CM

## 2023-01-16 DIAGNOSIS — Z634 Disappearance and death of family member: Secondary | ICD-10-CM

## 2023-01-16 DIAGNOSIS — I10 Essential (primary) hypertension: Secondary | ICD-10-CM

## 2023-01-16 DIAGNOSIS — I739 Peripheral vascular disease, unspecified: Secondary | ICD-10-CM

## 2023-01-16 MED ORDER — AMLODIPINE BESYLATE 5 MG PO TABS: 5.0000 mg | ORAL_TABLET | Freq: Every day | ORAL | 1 refills | Status: DC

## 2023-01-16 MED ORDER — ATORVASTATIN CALCIUM 40 MG PO TABS: 40.0000 mg | ORAL_TABLET | Freq: Every day | ORAL | 1 refills | Status: DC

## 2023-01-16 NOTE — Progress Notes (Signed)
Subjective:  Patient ID: Cody Baird, male    DOB: March 02, 1961  Age: 62 y.o. MRN: [id number]  CC: Hypertension   HPI Cody Baird is a 62 y.o. 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 He cries easily but he states he would like to avoid medications.  He is exercising with the goal of weight loss.  He endorses adherence with his antihypertensive and his statin.  Denies presence of claudication pains. He quit smoking a couple months ago. He has no additional concerns today. Past Medical History:  Diagnosis Date   Acute right MCA stroke ([place]) 01/30/2020   Aftercare following surgery of the circulatory system, NEC 01/14/2014   Arthritis    low back and left hip   Atherosclerosis of native arteries of the extremities with ulceration(440.23) 07/16/2013   Atherosclerotic peripheral vascular disease with rest pain ([place]) 10/30/2013   B12 deficiency    Bradycardia    Chronic back pain    CVA (cerebral vascular accident) ([place]) 01/28/2020   Essential hypertension 01/28/2020   History of bronchitis    last time about 2-28yr ago   Ischemic pain of foot 12/24/2013   PAD (peripheral artery disease) (HCC)    Pain in limb 06/25/2013   PBA (pseudobulbar affect)    Peripheral vascular disease ([place]    Peripheral vascular disease, unspecified ([facility] 06/25/2013   Post-op pain 09/17/2013   Post-operative pain 07/16/2013   Prolapsed, anus    hx of   PVD (peripheral vascular disease) ([facility] 09/17/2013   Stroke ([facility] 01/2020   TIA (transient ischemic attack) 01/28/2020   Tobacco abuse 01/28/2020    Venous stasis ulcer of ankle, right ([place] 02/17/2020    Past Surgical History:  Procedure Laterality Date   ABDOMINAL AORTAGRAM N/A 06/26/2013   Procedure: ABDOMINAL AORTAGRAM;  Surgeon: TRosetta Posner MD;  [facility];  Service: Cardiovascular;  Laterality: N/A;   AMPUTATION Right 12/04/2013   Procedure: AMPUTATION DIGIT- 3RD TOE RIGHT FOOT ;  Surgeon: TRosetta Posner MD;  [facility]  Service: Vascular;  Laterality: Right;   AMPUTATION Right 01/15/2014   Procedure: AMPUTATION OF RIGHT 4TH and 5TH TOES;  Surgeon: TRosetta Posner MD;  [facility];  Service: Vascular;  Laterality: Right;   BACK SURGERY     L1-2   BACK SURGERY     L1-2   BACK SURGERY     thinks L1- 2   COLON SURGERY     removal of colon due to proplase rectum   COLOSTOMY     COLOSTOMY CLOSURE  2006ish   ESOPHAGOGASTRODUODENOSCOPY     FEMORAL-TIBIAL BYPASS GRAFT Right 06/28/2013   Procedure: BYPASS GRAFT FEMORAL TO TIBIAL PERONEAL TRUNK ARTERY;  Surgeon: TRosetta Posner MD;  [facility]  Service: Vascular;  Laterality: Right;    Family History  Problem Relation Age of Onset   Cancer Mother    Diabetes Mother    Diabetes Father    Hyperlipidemia Father    Hypertension Father    Heart attack Father  Social History   Socioeconomic History   Marital status: Divorced    Spouse name: Not on file   Number of children: Not on file   Years of education: Not on file   Highest education level: Not on file  Occupational History   Not on file  Tobacco Use   Smoking status: Former    Packs/day: 0.50    Years: 36.00    Total pack years: 18.00    Types: Cigarettes    Quit date: 01/27/2020    Years since quitting: 61   Smokeless tobacco: Never  Substance and Sexual Activity   Alcohol use: No    Alcohol/week: 0.0 standard drinks of alcohol   Drug use: No   Sexual activity: Not on file  Other Topics Concern   Not on file  Social History Narrative   Not on file   Social Determinants of  Health   Financial Resource Strain: Not on file  Food Insecurity: Not on file  Transportation Needs: Not on file  Physical Activity: Not on file  Stress: Not on file  Social Connections: Not on file    Allergies  Allergen Reactions   Penicillins Other (See Comments)    Childhood allergy not sure what the reaction is  Did it involve swelling of the face/tongue/throat, SOB, or low BP? Unknown Did it involve sudden or severe rash/hives, skin peeling, or any reaction on the inside of your mouth or nose? Unknown Did you need to seek medical attention at a hospital or doctor's office? Unknown When did it last happen?    childhood   If all above answers are "NO", may proceed with cephalosporin use.     Outpatient Medications Prior to Visit  Medication Sig Dispense Refill   aspirin EC 81 MG tablet Take 1 tablet (81 mg total) by mouth daily. Swallow whole. 90 tablet 3   amLODipine (NORVASC) 5 MG tablet Take 1 tablet (5 mg total) by mouth daily. 90 tablet 1   atorvastatin (LIPITOR) 40 MG tablet Take 1 tablet (40 mg total) by mouth daily. 90 tablet 1   No facility-administered medications prior to visit.     ROS Review of Systems  Constitutional:  Negative for activity change and appetite change.  HENT:  Negative for sinus pressure and sore throat.   Respiratory:  Negative for chest tightness, shortness of breath and wheezing.   Cardiovascular:  Negative for chest pain and palpitations.  Gastrointestinal:  Negative for abdominal distention, abdominal pain and constipation.  Genitourinary: Negative.   Musculoskeletal: Negative.   Psychiatric/Behavioral:  Negative for behavioral problems and dysphoric mood.     Objective:  BP 131/76   Pulse 68   Temp 98 F (36.7 C) (Oral)   Ht 6' (1.829 m)   Wt 240 lb (108.9 kg)   SpO2 97%   BMI 32.55 kg/m      01/16/2023    1:40 PM 07/13/2022    3:14 PM 06/16/2022   11:02 AM  BP/Weight  Systolic BP A999333 [id number] A999333  Diastolic BP 76 77 64  Wt.  (Lbs) 240 255.6 254.6  BMI 32.55 kg/m2 34.67 kg/m2 34.53 kg/m2      Physical Exam Constitutional:      Appearance: He is well-developed.  Cardiovascular:     Rate and Rhythm: Normal rate.     Heart sounds: Normal heart sounds. No murmur heard. Pulmonary:     Effort: Pulmonary effort is normal.     Breath sounds: Normal breath sounds. No  wheezing or rales.  Chest:     Chest wall: No tenderness.  Abdominal:     General: Bowel sounds are normal. There is no distension.     Palpations: Abdomen is soft. There is no mass.     Tenderness: There is no abdominal tenderness.  Musculoskeletal:        General: Normal range of motion.     Right lower leg: No edema.     Left lower leg: No edema.  Neurological:     Mental Status: He is alert and oriented to person, place, and time.  Psychiatric:        Mood and Affect: Mood normal.        Latest Ref Rng & Units 09/29/2021    2:19 PM 12/08/2020    9:54 AM 02/05/2020   10:28 AM  CMP  Glucose 70 - 99 mg/dL 88  99  109   BUN 6 - 24 mg/dL '10  13  13   '$ Creatinine 0.76 - 1.27 mg/dL 1.06  1.02  0.99   Sodium 134 - 144 mmol/L 140  138  137   Potassium 3.5 - 5.2 mmol/L 4.4  4.8  3.7   Chloride 96 - 106 mmol/L 101  100  102   CO2 20 - 29 mmol/L '23  25  25   '$ Calcium 8.7 - 10.2 mg/dL 9.6  10.0  9.5   Total Protein 6.0 - 8.5 g/dL  7.5    Total Bilirubin 0.0 - 1.2 mg/dL  0.4    Alkaline Phos 44 - 121 IU/L  76    AST 0 - 40 IU/L  22    ALT 0 - 44 IU/L  32      Lipid Panel     Component Value Date/Time   CHOL 143 12/08/2020 0954   TRIG 126 12/08/2020 0954   HDL 41 12/08/2020 0954   CHOLHDL 3.5 12/08/2020 0954   CHOLHDL 6.6 01/28/2020 0612   VLDL 31 01/28/2020 0612   LDLCALC 79 12/08/2020 0954    CBC    Component Value Date/Time   WBC 7.7 02/03/2020 0559   RBC 5.36 02/03/2020 0559   HGB 16.3 02/03/2020 0559   HCT 49.9 02/03/2020 0559   PLT 196 02/03/2020 0559   MCV 93.1 02/03/2020 0559   MCH 30.4 02/03/2020 0559   MCHC 32.7  02/03/2020 0559   RDW 12.9 02/03/2020 0559   LYMPHSABS 2.0 01/31/2020 0504   MONOABS 0.9 01/31/2020 0504   EOSABS 0.3 01/31/2020 0504   BASOSABS 0.0 01/31/2020 0504    Lab Results  Component Value Date   HGBA1C 6.0 07/13/2022    Assessment & Plan:  1. Essential hypertension Controlled Counseled on blood pressure goal of less than 130/80, low-sodium, DASH diet, medication compliance, 150 minutes of moderate intensity exercise per week. Discussed medication compliance, adverse effects. - CMP14+EGFR - amLODipine (NORVASC) 5 MG tablet; Take 1 tablet (5 mg total) by mouth daily.  Dispense: 90 tablet; Refill: 1 - atorvastatin (LIPITOR) 40 MG tablet; Take 1 tablet (40 mg total) by mouth daily.  Dispense: 90 tablet; Refill: 1  2. Screening for colon cancer - Fecal occult blood, imunochemical([facility])  3. Bereavement He lost his mom in 08/2022 Discussed grief counseling but he is not interested in this Also offered to place him on a medication for his emotions-hydroxyzine as needed or possibly an SSRI but he declines He knows to contact me if he changes his mind  4. Peripheral vascular disease, unspecified ([place])  Asymptomatic Risk factor modification Continue statin - atorvastatin (LIPITOR) 40 MG tablet; Take 1 tablet (40 mg total) by mouth daily.  Dispense: 90 tablet; Refill: 1    Meds ordered this encounter  Medications   amLODipine (NORVASC) 5 MG tablet    Sig: Take 1 tablet (5 mg total) by mouth daily.    Dispense:  90 tablet    Refill:  1   atorvastatin (LIPITOR) 40 MG tablet    Sig: Take 1 tablet (40 mg total) by mouth daily.    Dispense:  90 tablet    Refill:  1    Follow-up: Return in about 6 months (around 07/19/2023) for Chronic medical conditions.       Charlott Rakes, MD, FAAFP. [facility] and [place], [place]   01/16/2023, 1:53 PM

## 2023-01-16 NOTE — Patient Instructions (Signed)
Exercising to Stay Healthy To become healthy and stay healthy, it is recommended that you do moderate-intensity and vigorous-intensity exercise. You can tell that you are exercising at a moderate intensity if your heart starts beating faster and you start breathing faster but can still hold a conversation. You can tell that you are exercising at a vigorous intensity if you are breathing much harder and faster and cannot hold a conversation while exercising. How can exercise benefit me? Exercising regularly is important. It has many health benefits, such as: Improving overall fitness, flexibility, and endurance. Increasing bone density. Helping with weight control. Decreasing body fat. Increasing muscle strength and endurance. Reducing stress and tension, anxiety, depression, or anger. Improving overall health. What guidelines should I follow while exercising? Before you start a new exercise program, talk with your health care provider. Do not exercise so much that you hurt yourself, feel dizzy, or get very short of breath. Wear comfortable clothes and wear shoes with good support. Drink plenty of water while you exercise to prevent dehydration or heat stroke. Work out until your breathing and your heartbeat get faster (moderate intensity). How often should I exercise? Choose an activity that you enjoy, and set realistic goals. Your health care provider can help you make an activity plan that is individually designed and works best for you. Exercise regularly as told by your health care provider. This may include: Doing strength training two times a week, such as: Lifting weights. Using resistance bands. Push-ups. Sit-ups. Yoga. Doing a certain intensity of exercise for a given amount of time. Choose from these options: A total of 150 minutes of moderate-intensity exercise every week. A total of 75 minutes of vigorous-intensity exercise every week. A mix of moderate-intensity and  vigorous-intensity exercise every week. Children, pregnant women, people who have not exercised regularly, people who are overweight, and older adults may need to talk with a health care provider about what activities are safe to perform. If you have a medical condition, be sure to talk with your health care provider before you start a new exercise program. What are some exercise ideas? Moderate-intensity exercise ideas include: Walking 1 mile (1.6 km) in about 15 minutes. Biking. Hiking. Golfing. Dancing. Water aerobics. Vigorous-intensity exercise ideas include: Walking 4.5 miles (7.2 km) or more in about 1 hour. Jogging or running 5 miles (8 km) in about 1 hour. Biking 10 miles (16.1 km) or more in about 1 hour. Lap swimming. Roller-skating or in-line skating. Cross-country skiing. Vigorous competitive sports, such as football, basketball, and soccer. Jumping rope. Aerobic dancing. What are some everyday activities that can help me get exercise? Yard work, such as: Pushing a lawn mower. Raking and bagging leaves. Washing your car. Pushing a stroller. Shoveling snow. Gardening. Washing windows or floors. How can I be more active in my day-to-day activities? Use stairs instead of an elevator. Take a walk during your lunch break. If you drive, park your car farther away from your work or school. If you take public transportation, get off one stop early and walk the rest of the way. Stand up or walk around during all of your indoor phone calls. Get up, stretch, and walk around every 30 minutes throughout the day. Enjoy exercise with a friend. Support to continue exercising will help you keep a regular routine of activity. Where to find more information You can find more information about exercising to stay healthy from: U.S. Department of Health and Human Services: www.hhs.gov Centers for Disease Control and Prevention (  CDC): www.cdc.gov Summary Exercising regularly is  important. It will improve your overall fitness, flexibility, and endurance. Regular exercise will also improve your overall health. It can help you control your weight, reduce stress, and improve your bone density. Do not exercise so much that you hurt yourself, feel dizzy, or get very short of breath. Before you start a new exercise program, talk with your health care provider. This information is not intended to replace advice given to you by your health care provider. Make sure you discuss any questions you have with your health care provider. Document Revised: 02/26/2021 Document Reviewed: 02/26/2021 Elsevier Patient Education  2023 [employer].  

## 2023-01-17 LAB — CMP14+EGFR
ALT: 26 IU/L (ref 0–44)
AST: 20 IU/L (ref 0–40)
Albumin/Globulin Ratio: 1.6 (ref 1.2–2.2)
Albumin: 4.5 g/dL (ref 3.8–4.9)
Alkaline Phosphatase: 82 IU/L (ref 44–121)
BUN/Creatinine Ratio: 9 — ABNORMAL LOW (ref 10–24)
BUN: 8 mg/dL (ref 8–27)
Bilirubin Total: 0.5 mg/dL (ref 0.0–1.2)
CO2: 21 mmol/L (ref 20–29)
Calcium: 9.6 mg/dL (ref 8.6–10.2)
Chloride: 102 mmol/L (ref 96–106)
Creatinine, Ser: 0.89 mg/dL (ref 0.76–1.27)
Globulin, Total: 2.8 g/dL (ref 1.5–4.5)
Glucose: 72 mg/dL (ref 70–99)
Potassium: 4.3 mmol/L (ref 3.5–5.2)
Sodium: 140 mmol/L (ref 134–144)
Total Protein: 7.3 g/dL (ref 6.0–8.5)
eGFR: 98 mL/min/{1.73_m2} (ref >59)

## 2023-07-19 ENCOUNTER — Ambulatory Visit: Payer: Self-pay | Admitting: Family Medicine

## 2023-08-10 ENCOUNTER — Ambulatory Visit: Payer: Self-pay | Attending: Family Medicine | Admitting: Family Medicine

## 2023-08-10 ENCOUNTER — Encounter: Payer: Self-pay | Admitting: Family Medicine

## 2023-08-10 VITALS — BP 138/72 | HR 75 | Ht 72.0 in | Wt 243.8 lb

## 2023-08-10 DIAGNOSIS — I10 Essential (primary) hypertension: Secondary | ICD-10-CM

## 2023-08-10 DIAGNOSIS — F17210 Nicotine dependence, cigarettes, uncomplicated: Secondary | ICD-10-CM

## 2023-08-10 DIAGNOSIS — Z125 Encounter for screening for malignant neoplasm of prostate: Secondary | ICD-10-CM

## 2023-08-10 DIAGNOSIS — R7303 Prediabetes: Secondary | ICD-10-CM

## 2023-08-10 DIAGNOSIS — I739 Peripheral vascular disease, unspecified: Secondary | ICD-10-CM

## 2023-08-10 MED ORDER — ATORVASTATIN CALCIUM 40 MG PO TABS: 40.0000 mg | ORAL_TABLET | Freq: Every day | ORAL | 1 refills | Status: DC

## 2023-08-10 MED ORDER — AMLODIPINE BESYLATE 5 MG PO TABS: 5.0000 mg | ORAL_TABLET | Freq: Every day | ORAL | 1 refills | Status: DC

## 2023-08-10 NOTE — Progress Notes (Signed)
Subjective:  Patient ID: Cody Baird, male    DOB: [birth year]  Age: 62 y.o. MRN: [id number]  CC: Medical Management of Chronic Issues   HPI Cody Baird is a 61 y.o. year old male with a history of tobacco abuse (quit after 40 years of smoking greater than 20-pack-year), PAD s/p R LE bypass graft diagnosed with right MCA stroke in 01/2020 (he presented outside the window for TPA), status post amputation of right lateral 3 toes, Prediabetes (A1c 6.0), nicotine dependence (quit in 2023)  Interval History: Discussed the use of AI scribe software for clinical note transcription with the patient, who gave verbal consent to proceed.  He presents with recent weight gain. His chart reveals an increase of 3 pounds, which he attributes to late-night eating and decreased physical activity following the death of his mother last year . He expresses dissatisfaction with his current weight and plans to increase his treadmill use and improve his diet. He currently uses the treadmill for 15-20 minutes, two to three times a day, every day. He also reports occasional hip and back pain, which sometimes interferes with his exercise routine.  In addition to his weight concerns, the patient has a history of stroke, which he states has affected his short-term memory.  His PVD is stable with no claudication pain and he remains on his statin. Also endorses adherence with his Amlodipine for HTN.        Past Medical History:  Diagnosis Date   Acute right MCA stroke (HCC) 01/30/2020   Aftercare following surgery of the circulatory system, NEC 01/14/2014   Arthritis    low back and left hip   Atherosclerosis of native arteries of the extremities with ulceration(440.23) 07/16/2013   Atherosclerotic peripheral vascular disease with rest pain (HCC) 10/30/2013   B12 deficiency    Bradycardia    Chronic back pain    CVA (cerebral vascular accident) (HCC) 01/28/2020   Essential hypertension 01/28/2020   History of  bronchitis    last time about 2-59yrs ago   Ischemic pain of foot 12/24/2013   PAD (peripheral artery disease) (HCC)    Pain in limb 06/25/2013   PBA (pseudobulbar affect)    Peripheral vascular disease (HCC)    Peripheral vascular disease, unspecified (HCC) 06/25/2013   Post-op pain 09/17/2013   Post-operative pain 07/16/2013   Prolapsed, anus    hx of   PVD (peripheral vascular disease) (HCC) 09/17/2013   Stroke (HCC) 01/2020   TIA (transient ischemic attack) 01/28/2020   Tobacco abuse 01/28/2020   Venous stasis ulcer of ankle, right (HCC) 02/17/2020    Past Surgical History:  Procedure Laterality Date   ABDOMINAL AORTAGRAM N/A 06/26/2013   Procedure: ABDOMINAL AORTAGRAM;  Surgeon: Larina Earthly, MD;  Location: [facility] CATH LAB;  Service: Cardiovascular;  Laterality: N/A;   AMPUTATION Right 12/04/2013   Procedure: AMPUTATION DIGIT- 3RD TOE RIGHT FOOT ;  Surgeon: Larina Earthly, MD;  Location: [facility] OR;  Service: Vascular;  Laterality: Right;   AMPUTATION Right 01/15/2014   Procedure: AMPUTATION OF RIGHT 4TH and 5TH TOES;  Surgeon: Larina Earthly, MD;  Location: [facility] OR;  Service: Vascular;  Laterality: Right;   BACK SURGERY     L1-2   BACK SURGERY     L1-2   BACK SURGERY     thinks L1- 2   COLON SURGERY     removal of colon due to proplase rectum   COLOSTOMY     COLOSTOMY CLOSURE  [id number]   ESOPHAGOGASTRODUODENOSCOPY     FEMORAL-TIBIAL BYPASS GRAFT Right 06/28/2013   Procedure: BYPASS GRAFT FEMORAL TO TIBIAL PERONEAL TRUNK ARTERY;  Surgeon: Larina Earthly, MD;  Location: [facility] OR;  Service: Vascular;  Laterality: Right;    Family History  Problem Relation Age of Onset   Cancer Mother    Diabetes Mother    Diabetes Father    Hyperlipidemia Father    Hypertension Father    Heart attack Father     Social History   Socioeconomic History   Marital status: Divorced    Spouse name: Not on file   Number of children: Not on file   Years of education: Not on file   Highest education level:  Not on file  Occupational History   Not on file  Tobacco Use   Smoking status: Former    Current packs/day: 0.00    Average packs/day: 0.5 packs/day for 36.0 years (18.0 ttl pk-yrs)    Types: Cigarettes    Start date: 01/27/1984    Quit date: 01/27/2020    Years since quitting: 3.5   Smokeless tobacco: Never  Substance and Sexual Activity   Alcohol use: No    Alcohol/week: 0.0 standard drinks of alcohol   Drug use: No   Sexual activity: Not on file  Other Topics Concern   Not on file  Social History Narrative   Not on file   Social Determinants of Health   Financial Resource Strain: Not on file  Food Insecurity: Not on file  Transportation Needs: Not on file  Physical Activity: Not on file  Stress: Not on file  Social Connections: Not on file    Allergies  Allergen Reactions   Penicillins Other (See Comments)    Childhood allergy not sure what the reaction is  Did it involve swelling of the face/tongue/throat, SOB, or low BP? Unknown Did it involve sudden or severe rash/hives, skin peeling, or any reaction on the inside of your mouth or nose? Unknown Did you need to seek medical attention at a hospital or doctor's office? Unknown When did it last happen?    childhood   If all above answers are "NO", may proceed with cephalosporin use.     Outpatient Medications Prior to Visit  Medication Sig Dispense Refill   aspirin EC 81 MG tablet Take 1 tablet (81 mg total) by mouth daily. Swallow whole. 90 tablet 3   amLODipine (NORVASC) 5 MG tablet Take 1 tablet (5 mg total) by mouth daily. 90 tablet 1   atorvastatin (LIPITOR) 40 MG tablet Take 1 tablet (40 mg total) by mouth daily. 90 tablet 1   No facility-administered medications prior to visit.     ROS Review of Systems  Constitutional:  Negative for activity change and appetite change.  HENT:  Negative for sinus pressure and sore throat.   Respiratory:  Negative for chest tightness, shortness of breath and wheezing.    Cardiovascular:  Negative for chest pain and palpitations.  Gastrointestinal:  Negative for abdominal distention, abdominal pain and constipation.  Genitourinary: Negative.   Musculoskeletal: Negative.   Psychiatric/Behavioral:  Negative for behavioral problems and dysphoric mood.     Objective:  BP 138/72   Pulse 75   Ht 6' (1.829 m)   Wt 243 lb 12.8 oz (110.6 kg)   SpO2 95%   BMI 33.07 kg/m      08/10/2023    4:11 PM 01/16/2023    1:40 PM 07/13/2022    3:14 PM  BP/Weight  Systolic BP 138 131 142  Diastolic BP 72 76 77  Wt. (Lbs) 243.8 240 255.6  BMI 33.07 kg/m2 32.55 kg/m2 34.67 kg/m2    Wt Readings from Last 3 Encounters:  08/10/23 243 lb 12.8 oz (110.6 kg)  01/16/23 240 lb (108.9 kg)  07/13/22 255 lb 9.6 oz (115.9 kg)     Physical Exam Constitutional:      Appearance: He is well-developed.  Cardiovascular:     Rate and Rhythm: Normal rate.     Heart sounds: Normal heart sounds. No murmur heard. Pulmonary:     Effort: Pulmonary effort is normal.     Breath sounds: Normal breath sounds. No wheezing or rales.  Chest:     Chest wall: No tenderness.  Abdominal:     General: Bowel sounds are normal. There is no distension.     Palpations: Abdomen is soft. There is no mass.     Tenderness: There is no abdominal tenderness.  Musculoskeletal:        General: Normal range of motion.     Right lower leg: No edema.     Left lower leg: No edema.  Neurological:     Mental Status: He is alert and oriented to person, place, and time.  Psychiatric:        Mood and Affect: Mood normal.        Latest Ref Rng & Units 01/16/2023    2:00 PM 09/29/2021    2:19 PM 12/08/2020    9:54 AM  CMP  Glucose 70 - 99 mg/dL 72  88  99   BUN 8 - 27 mg/dL 8  10  13    Creatinine 0.76 - 1.27 mg/dL 8.24  2.35  3.61   Sodium 134 - 144 mmol/L 140  140  138   Potassium 3.5 - 5.2 mmol/L 4.3  4.4  4.8   Chloride 96 - 106 mmol/L 102  101  100   CO2 20 - 29 mmol/L 21  23  25    Calcium 8.6 -  10.2 mg/dL 9.6  9.6  44.3   Total Protein 6.0 - 8.5 g/dL 7.3   7.5   Total Bilirubin 0.0 - 1.2 mg/dL 0.5   0.4   Alkaline Phos 44 - 121 IU/L 82   76   AST 0 - 40 IU/L 20   22   ALT 0 - 44 IU/L 26   32     Lipid Panel     Component Value Date/Time   CHOL 143 12/08/2020 0954   TRIG 126 12/08/2020 0954   HDL 41 12/08/2020 0954   CHOLHDL 3.5 12/08/2020 0954   CHOLHDL 6.6 01/28/2020 0612   VLDL 31 01/28/2020 0612   LDLCALC 79 12/08/2020 0954    CBC    Component Value Date/Time   WBC 7.7 02/03/2020 0559   RBC 5.36 02/03/2020 0559   HGB 16.3 02/03/2020 0559   HCT 49.9 02/03/2020 0559   PLT 196 02/03/2020 0559   MCV 93.1 02/03/2020 0559   MCH 30.4 02/03/2020 0559   MCHC 32.7 02/03/2020 0559   RDW 12.9 02/03/2020 0559   LYMPHSABS 2.0 01/31/2020 0504   MONOABS 0.9 01/31/2020 0504   EOSABS 0.3 01/31/2020 0504   BASOSABS 0.0 01/31/2020 0504    Lab Results  Component Value Date   HGBA1C 6.0 07/13/2022    Assessment & Plan:      Weight Gain Patient reports recent weight gain and is unhappy with this. He plans to  increase his treadmill use and improve his diet. He also acknowledges late-night eating as a potential contributing factor. -Increase physical activity to 30 minutes daily. -Improve diet and avoid late-night eating.  Colon Cancer Screening Patient has not completed the at-home colon cancer screening kit provided. -Complete at-home colon cancer screening kit.  Lung Cancer Screening Patient has not completed the recommended CT scan for lung cancer screening due to transportation issues. -Schedule CT scan for lung cancer screening with ample notice for patient to arrange transportation.  PVD No claudication pain -Doing well on a Statin -Continue current management plan.  HTN Controlled Continue Amlodipine -Counseled on blood pressure goal of less than 130/80, low-sodium, DASH diet, medication compliance, 150 minutes of moderate intensity exercise per  week. Discussed medication compliance, adverse effects.   General Health Maintenance -Continue to monitor weight and diet. -Complete colon cancer screening and lung cancer screening. -Follow-up appointment to assess progress.          Meds ordered this encounter  Medications   amLODipine (NORVASC) 5 MG tablet    Sig: Take 1 tablet (5 mg total) by mouth daily.    Dispense:  90 tablet    Refill:  1   atorvastatin (LIPITOR) 40 MG tablet    Sig: Take 1 tablet (40 mg total) by mouth daily.    Dispense:  90 tablet    Refill:  1    Follow-up: Return in about 6 months (around 02/07/2024) for Chronic medical conditions.       Hoy Register, MD, FAAFP. [facility] [place], [place] [phone]   08/10/2023, 5:06 PM

## 2023-08-10 NOTE — Patient Instructions (Signed)
Calorie Counting for Weight Loss Calories are units of energy. Your body needs a certain number of calories from food to keep going throughout the day. When you eat or drink more calories than your body needs, your body stores the extra calories mostly as fat. When you eat or drink fewer calories than your body needs, your body burns fat to get the energy it needs. Calorie counting means keeping track of how many calories you eat and drink each day. Calorie counting can be helpful if you need to lose weight. If you eat fewer calories than your body needs, you should lose weight. Ask your health care provider what a healthy weight is for you. For calorie counting to work, you will need to eat the right number of calories each day to lose a healthy amount of weight per week. A dietitian can help you figure out how many calories you need in a day and will suggest ways to reach your calorie goal. A healthy amount of weight to lose each week is usually 1-2 lb (0.5-0.9 kg). This usually means that your daily calorie intake should be reduced by 500-750 calories. Eating 1,200-1,500 calories a day can help most women lose weight. Eating 1,500-1,800 calories a day can help most men lose weight. What do I need to know about calorie counting? Work with your health care provider or dietitian to determine how many calories you should get each day. To meet your daily calorie goal, you will need to: Find out how many calories are in each food that you would like to eat. Try to do this before you eat. Decide how much of the food you plan to eat. Keep a food log. Do this by writing down what you ate and how many calories it had. To successfully lose weight, it is important to balance calorie counting with a healthy lifestyle that includes regular activity. Where do I find calorie information?  The number of calories in a food can be found on a Nutrition Facts label. If a food does not have a Nutrition Facts label, try  to look up the calories online or ask your dietitian for help. Remember that calories are listed per serving. If you choose to have more than one serving of a food, you will have to multiply the calories per serving by the number of servings you plan to eat. For example, the label on a package of bread might say that a serving size is 1 slice and that there are 90 calories in a serving. If you eat 1 slice, you will have eaten 90 calories. If you eat 2 slices, you will have eaten 180 calories. How do I keep a food log? After each time that you eat, record the following in your food log as soon as possible: What you ate. Be sure to include toppings, sauces, and other extras on the food. How much you ate. This can be measured in cups, ounces, or number of items. How many calories were in each food and drink. The total number of calories in the food you ate. Keep your food log near you, such as in a pocket-sized notebook or on an app or website on your mobile phone. Some programs will calculate calories for you and show you how many calories you have left to meet your daily goal. What are some portion-control tips? Know how many calories are in a serving. This will help you know how many servings you can have of a certain  food. Use a measuring cup to measure serving sizes. You could also try weighing out portions on a kitchen scale. With time, you will be able to estimate serving sizes for some foods. Take time to put servings of different foods on your favorite plates or in your favorite bowls and cups so you know what a serving looks like. Try not to eat straight from a food's packaging, such as from a bag or box. Eating straight from the package makes it hard to see how much you are eating and can lead to overeating. Put the amount you would like to eat in a cup or on a plate to make sure you are eating the right portion. Use smaller plates, glasses, and bowls for smaller portions and to prevent  overeating. Try not to multitask. For example, avoid watching TV or using your computer while eating. If it is time to eat, sit down at a table and enjoy your food. This will help you recognize when you are full. It will also help you be more mindful of what and how much you are eating. What are tips for following this plan? Reading food labels Check the calorie count compared with the serving size. The serving size may be smaller than what you are used to eating. Check the source of the calories. Try to choose foods that are high in protein, fiber, and vitamins, and low in saturated fat, trans fat, and sodium. Shopping Read nutrition labels while you shop. This will help you make healthy decisions about which foods to buy. Pay attention to nutrition labels for low-fat or fat-free foods. These foods sometimes have the same number of calories or more calories than the full-fat versions. They also often have added sugar, starch, or salt to make up for flavor that was removed with the fat. Make a grocery list of lower-calorie foods and stick to it. Cooking Try to cook your favorite foods in a healthier way. For example, try baking instead of frying. Use low-fat dairy products. Meal planning Use more fruits and vegetables. One-half of your plate should be fruits and vegetables. Include lean proteins, such as chicken, [place], and fish. Lifestyle Each week, aim to do one of the following: 150 minutes of moderate exercise, such as walking. 75 minutes of vigorous exercise, such as running. General information Know how many calories are in the foods you eat most often. This will help you calculate calorie counts faster. Find a way of tracking calories that works for you. Get creative. Try different apps or programs if writing down calories does not work for you. What foods should I eat?  Eat nutritious foods. It is better to have a nutritious, high-calorie food, such as an avocado, than a food with  few nutrients, such as a bag of potato chips. Use your calories on foods and drinks that will fill you up and will not leave you hungry soon after eating. Examples of foods that fill you up are nuts and nut butters, vegetables, lean proteins, and high-fiber foods such as whole grains. High-fiber foods are foods with more than 5 g of fiber per serving. Pay attention to calories in drinks. Low-calorie drinks include water and unsweetened drinks. The items listed above may not be a complete list of foods and beverages you can eat. Contact a dietitian for more information. What foods should I limit? Limit foods or drinks that are not good sources of vitamins, minerals, or protein or that are high in unhealthy fats. These  include: Candy. Other sweets. Sodas, specialty coffee drinks, alcohol, and juice. The items listed above may not be a complete list of foods and beverages you should avoid. Contact a dietitian for more information. How do I count calories when eating out? Pay attention to portions. Often, portions are much larger when eating out. Try these tips to keep portions smaller: Consider sharing a meal instead of getting your own. If you get your own meal, eat only half of it. Before you start eating, ask for a container and put half of your meal into it. When available, consider ordering smaller portions from the menu instead of full portions. Pay attention to your food and drink choices. Knowing the way food is cooked and what is included with the meal can help you eat fewer calories. If calories are listed on the menu, choose the lower-calorie options. Choose dishes that include vegetables, fruits, whole grains, low-fat dairy products, and lean proteins. Choose items that are boiled, broiled, grilled, or steamed. Avoid items that are buttered, battered, fried, or served with cream sauce. Items labeled as crispy are usually fried, unless stated otherwise. Choose water, low-fat milk,  unsweetened iced tea, or other drinks without added sugar. If you want an alcoholic beverage, choose a lower-calorie option, such as a glass of wine or light beer. Ask for dressings, sauces, and syrups on the side. These are usually high in calories, so you should limit the amount you eat. If you want a salad, choose a garden salad and ask for grilled meats. Avoid extra toppings such as bacon, cheese, or fried items. Ask for the dressing on the side, or ask for olive oil and vinegar or lemon to use as dressing. Estimate how many servings of a food you are given. Knowing serving sizes will help you be aware of how much food you are eating at restaurants. Where to find more information Centers for Disease Control and Prevention: FootballExhibition.com.br U.S. Department of Agriculture: WrestlingReporter.dk Summary Calorie counting means keeping track of how many calories you eat and drink each day. If you eat fewer calories than your body needs, you should lose weight. A healthy amount of weight to lose per week is usually 1-2 lb (0.5-0.9 kg). This usually means reducing your daily calorie intake by 500-750 calories. The number of calories in a food can be found on a Nutrition Facts label. If a food does not have a Nutrition Facts label, try to look up the calories online or ask your dietitian for help. Use smaller plates, glasses, and bowls for smaller portions and to prevent overeating. Use your calories on foods and drinks that will fill you up and not leave you hungry shortly after a meal. This information is not intended to replace advice given to you by your health care provider. Make sure you discuss any questions you have with your health care provider. Document Revised: 12/12/2019 Document Reviewed: 12/12/2019 Elsevier Patient Education  2023 [employer].

## 2023-08-11 LAB — PSA, TOTAL AND FREE
PSA, Free Pct: 16.9 %
PSA, Free: 0.22 ng/mL
Prostate Specific Ag, Serum: 1.3 ng/mL (ref 0.0–4.0)

## 2023-08-11 LAB — BASIC METABOLIC PANEL
BUN/Creatinine Ratio: 13 (ref 10–24)
BUN: 13 mg/dL (ref 8–27)
CO2: 21 mmol/L (ref 20–29)
Calcium: 9.9 mg/dL (ref 8.6–10.2)
Chloride: 102 mmol/L (ref 96–106)
Creatinine, Ser: 1.02 mg/dL (ref 0.76–1.27)
Glucose: 136 mg/dL — ABNORMAL HIGH (ref 70–99)
Potassium: 4.3 mmol/L (ref 3.5–5.2)
Sodium: 141 mmol/L (ref 134–144)
eGFR: 84 mL/min/{1.73_m2} (ref >59)

## 2023-08-11 LAB — HEMOGLOBIN A1C
Est. average glucose Bld gHb Est-mCnc: 114 mg/dL
Hgb A1c MFr Bld: 5.6 % (ref 4.8–5.6)

## 2023-12-05 ENCOUNTER — Encounter (HOSPITAL_COMMUNITY): Payer: Self-pay

## 2023-12-05 ENCOUNTER — Other Ambulatory Visit (HOSPITAL_COMMUNITY): Payer: Self-pay

## 2023-12-05 ENCOUNTER — Emergency Department (HOSPITAL_COMMUNITY): Payer: Self-pay

## 2023-12-05 ENCOUNTER — Other Ambulatory Visit: Payer: Self-pay

## 2023-12-05 ENCOUNTER — Emergency Department (HOSPITAL_COMMUNITY)
Admission: EM | Admit: 2023-12-05 | Discharge: 2023-12-05 | Disposition: A | Discharge status: Home / Self Care | Payer: Self-pay | Attending: Emergency Medicine | Admitting: Emergency Medicine

## 2023-12-05 DIAGNOSIS — Z7982 Long term (current) use of aspirin: Secondary | ICD-10-CM | POA: Insufficient documentation

## 2023-12-05 DIAGNOSIS — S71142A Puncture wound with foreign body, left thigh, initial encounter: Secondary | ICD-10-CM | POA: Insufficient documentation

## 2023-12-05 DIAGNOSIS — W3400XA Accidental discharge from unspecified firearms or gun, initial encounter: Secondary | ICD-10-CM | POA: Insufficient documentation

## 2023-12-05 DIAGNOSIS — Z23 Encounter for immunization: Secondary | ICD-10-CM | POA: Insufficient documentation

## 2023-12-05 DIAGNOSIS — I10 Essential (primary) hypertension: Secondary | ICD-10-CM | POA: Insufficient documentation

## 2023-12-05 DIAGNOSIS — Z79899 Other long term (current) drug therapy: Secondary | ICD-10-CM | POA: Insufficient documentation

## 2023-12-05 LAB — COMPREHENSIVE METABOLIC PANEL
ALT: 24 U/L (ref 0–44)
AST: 21 U/L (ref 15–41)
Albumin: 3.8 g/dL (ref 3.5–5.0)
Alkaline Phosphatase: 63 U/L (ref 38–126)
Anion gap: 11 (ref 5–15)
BUN: 10 mg/dL (ref 8–23)
CO2: 25 mmol/L (ref 22–32)
Calcium: 10.1 mg/dL (ref 8.9–10.3)
Chloride: 102 mmol/L (ref 98–111)
Creatinine, Ser: 1.02 mg/dL (ref 0.61–1.24)
GFR, Estimated: >60 mL/min (ref >60)
Glucose, Bld: 150 mg/dL — ABNORMAL HIGH (ref 70–99)
Potassium: 3.9 mmol/L (ref 3.5–5.1)
Sodium: 138 mmol/L (ref 135–145)
Total Bilirubin: 0.6 mg/dL (ref 0.0–1.2)
Total Protein: 7.2 g/dL (ref 6.5–8.1)

## 2023-12-05 LAB — URINALYSIS, ROUTINE W REFLEX MICROSCOPIC
Bilirubin Urine: NEGATIVE
Glucose, UA: NEGATIVE mg/dL
Hgb urine dipstick: NEGATIVE
Ketones, ur: NEGATIVE mg/dL
Leukocytes,Ua: NEGATIVE
Nitrite: NEGATIVE
Protein, ur: NEGATIVE mg/dL
Specific Gravity, Urine: >1.046 — ABNORMAL HIGH (ref 1.005–1.030)
pH: 6 (ref 5.0–8.0)

## 2023-12-05 LAB — CBC
HCT: 51.1 % (ref 39.0–52.0)
Hemoglobin: 16.9 g/dL (ref 13.0–17.0)
MCH: 31.4 pg (ref 26.0–34.0)
MCHC: 33.1 g/dL (ref 30.0–36.0)
MCV: 95 fL (ref 80.0–100.0)
Platelets: 178 10*3/uL (ref 150–400)
RBC: 5.38 MIL/uL (ref 4.22–5.81)
RDW: 12.6 % (ref 11.5–15.5)
WBC: 7.3 10*3/uL (ref 4.0–10.5)
nRBC: 0 % (ref 0.0–0.2)

## 2023-12-05 LAB — I-STAT CHEM 8, ED
BUN: 11 mg/dL (ref 8–23)
Calcium, Ion: 1.28 mmol/L (ref 1.15–1.40)
Chloride: 101 mmol/L (ref 98–111)
Creatinine, Ser: 1 mg/dL (ref 0.61–1.24)
Glucose, Bld: 141 mg/dL — ABNORMAL HIGH (ref 70–99)
HCT: 52 % (ref 39.0–52.0)
Hemoglobin: 17.7 g/dL — ABNORMAL HIGH (ref 13.0–17.0)
Potassium: 3.7 mmol/L (ref 3.5–5.1)
Sodium: 139 mmol/L (ref 135–145)
TCO2: 25 mmol/L (ref 22–32)

## 2023-12-05 LAB — SAMPLE TO BLOOD BANK

## 2023-12-05 LAB — I-STAT CG4 LACTIC ACID, ED: Lactic Acid, Venous: 2.4 mmol/L (ref 0.5–1.9)

## 2023-12-05 LAB — ETHANOL: Alcohol, Ethyl (B): <10 mg/dL (ref <10)

## 2023-12-05 LAB — PROTIME-INR
INR: 1 (ref 0.8–1.2)
Prothrombin Time: 13.5 s (ref 11.4–15.2)

## 2023-12-05 MED ORDER — OXYCODONE HCL 5 MG PO TABS
5.0000 mg | ORAL_TABLET | ORAL | 0 refills | Status: DC | PRN
  Filled 2023-12-05: qty 15, 3d supply, fill #0

## 2023-12-05 MED ORDER — OXYCODONE-ACETAMINOPHEN 5-325 MG PO TABS: 1.0000 | ORAL_TABLET | Freq: Four times a day (QID) | ORAL | 0 refills | Status: DC | PRN

## 2023-12-05 MED ORDER — FENTANYL CITRATE PF 50 MCG/ML IJ SOSY: 50.0000 ug | PREFILLED_SYRINGE | Freq: Once | INTRAMUSCULAR | Status: DC

## 2023-12-05 MED ORDER — IOHEXOL 350 MG/ML SOLN
100.0000 mL | Freq: Once | INTRAVENOUS | Status: AC | PRN
  Administered 2023-12-05: 100 mL via INTRAVENOUS

## 2023-12-05 MED ORDER — FENTANYL CITRATE PF 50 MCG/ML IJ SOSY
50.0000 ug | PREFILLED_SYRINGE | Freq: Once | INTRAMUSCULAR | Status: AC
  Administered 2023-12-05: 50 ug via INTRAVENOUS
  Filled 2023-12-05: qty 1

## 2023-12-05 MED ORDER — FENTANYL CITRATE PF 50 MCG/ML IJ SOSY
PREFILLED_SYRINGE | INTRAMUSCULAR | Status: AC
  Administered 2023-12-05: 50 ug
  Filled 2023-12-05: qty 1

## 2023-12-05 MED ORDER — OXYCODONE HCL 5 MG PO TABS
5.0000 mg | ORAL_TABLET | ORAL | Status: AC
  Administered 2023-12-05: 5 mg via ORAL
  Filled 2023-12-05: qty 1

## 2023-12-05 MED ORDER — TRANEXAMIC ACID FOR EPISTAXIS
500.0000 mg | Freq: Once | TOPICAL | Status: DC
  Filled 2023-12-05: qty 10

## 2023-12-05 MED ORDER — TETANUS-DIPHTH-ACELL PERTUSSIS 5-2.5-18.5 LF-MCG/0.5 IM SUSY
0.5000 mL | PREFILLED_SYRINGE | Freq: Once | INTRAMUSCULAR | Status: AC
  Administered 2023-12-05: 0.5 mL via INTRAMUSCULAR

## 2023-12-05 NOTE — Progress Notes (Signed)
Visited with pt. Pt. Being cared for by staff.  Chaplain will follow as needed.
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
## 2023-12-05 NOTE — ED Notes (Signed)
Tourniquet took down by Dr. Karene Fry.
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
## 2023-12-05 NOTE — ED Provider Notes (Signed)
  Physical Exam  BP (!) 85/65 (BP Location: Left Arm)   Pulse 82   Temp 97.7 F (36.5 C) (Oral)   Resp 17   Ht 6' (1.829 m)   Wt 108.9 kg   SpO2 95%   BMI 32.55 kg/m   Physical Exam  Procedures  Procedures  ED Course / MDM   Clinical Course as of 12/05/23 1546  Tue Dec 05, 2023  1537 Assumed care from Dr Karene Fry. 63 yo M with hx of lower extremity PAD. Had CTA with poor timing but does not appear to have arterial injury. ABIs were symmetric but 0.82 bilaterally. Still soaking gauze. Trauma aware. Will see if can be dc'd if hemostasis achieved.   [RP]    Clinical Course User Index [RP] Rondel Baton, MD   Medical Decision Making Amount and/or Complexity of Data Reviewed Labs: ordered. Radiology: ordered.  Risk Prescription drug management.   ***
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
## 2023-12-13 ENCOUNTER — Encounter: Payer: Self-pay | Admitting: Nurse Practitioner

## 2023-12-13 ENCOUNTER — Ambulatory Visit: Payer: Self-pay | Attending: Nurse Practitioner | Admitting: Nurse Practitioner

## 2023-12-13 VITALS — BP 120/79 | HR 66 | Resp 19 | Ht 72.0 in | Wt 241.8 lb

## 2023-12-13 DIAGNOSIS — Z09 Encounter for follow-up examination after completed treatment for conditions other than malignant neoplasm: Secondary | ICD-10-CM

## 2023-12-13 DIAGNOSIS — S71132D Puncture wound without foreign body, left thigh, subsequent encounter: Secondary | ICD-10-CM

## 2023-12-13 DIAGNOSIS — I10 Essential (primary) hypertension: Secondary | ICD-10-CM

## 2023-12-13 NOTE — Progress Notes (Signed)
I have seen and examined this patient with the advanced practice provider and agree with the clinical note.

## 2023-12-13 NOTE — Patient Instructions (Signed)
[facility] Surgery Address: [address], [place], [place] [address] Open ? Closes 5?PM Phone: [phone]
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
## 2024-01-10 ENCOUNTER — Ambulatory Visit: Payer: Self-pay | Attending: Cardiology | Admitting: Cardiology

## 2024-01-10 ENCOUNTER — Encounter: Payer: Self-pay | Admitting: Cardiology

## 2024-01-10 VITALS — BP 126/80 | HR 69 | Ht 72.0 in | Wt 244.6 lb

## 2024-01-10 DIAGNOSIS — E782 Mixed hyperlipidemia: Secondary | ICD-10-CM

## 2024-01-10 DIAGNOSIS — I639 Cerebral infarction, unspecified: Secondary | ICD-10-CM

## 2024-01-10 DIAGNOSIS — I739 Peripheral vascular disease, unspecified: Secondary | ICD-10-CM

## 2024-01-10 DIAGNOSIS — I10 Essential (primary) hypertension: Secondary | ICD-10-CM

## 2024-01-10 NOTE — Patient Instructions (Signed)
 Medication Instructions:  Your physician recommends that you continue on your current medications as directed. Please refer to the Current Medication list given to you today.  *If you need a refill on your cardiac medications before your next appointment, please call your pharmacy*   Lab Work: Your physician recommends that you return for lab work in:   Labs today: Lipids  If you have labs (blood work) drawn today and your tests are completely normal, you will receive your results only by: MyChart Message (if you have MyChart) OR A paper copy in the mail If you have any lab test that is abnormal or we need to change your treatment, we will call you to review the results.   Testing/Procedures: None   Follow-Up: At [facility], you and your health needs are our priority.  As part of our continuing mission to provide you with exceptional heart care, we have created designated Provider Care Teams.  These Care Teams include your primary Cardiologist (physician) and Advanced Practice Providers (APPs -  Physician Assistants and Nurse Practitioners) who all work together to provide you with the care you need, when you need it.  We recommend signing up for the patient portal called "MyChart".  Sign up information is provided on this After Visit Summary.  MyChart is used to connect with patients for Virtual Visits (Telemedicine).  Patients are able to view lab/test results, encounter notes, upcoming appointments, etc.  Non-urgent messages can be sent to your provider as well.   To learn more about what you can do with MyChart, go to [email or web address].    Your next appointment:   1 year(s)  Provider:   Norman Herrlich, MD    Other Instructions Get an Apple smart watch.
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
## 2024-01-11 LAB — LIPID PANEL
Chol/HDL Ratio: 2.7 {ratio} (ref 0.0–5.0)
Cholesterol, Total: 119 mg/dL (ref 100–199)
HDL: 44 mg/dL (ref >39)
LDL Chol Calc (NIH): 55 mg/dL (ref 0–99)
Triglycerides: 109 mg/dL (ref 0–149)
VLDL Cholesterol Cal: 20 mg/dL (ref 5–40)

## 2024-02-01 ENCOUNTER — Other Ambulatory Visit: Payer: Self-pay

## 2024-02-01 DIAGNOSIS — I10 Essential (primary) hypertension: Secondary | ICD-10-CM

## 2024-02-01 MED ORDER — AMLODIPINE BESYLATE 5 MG PO TABS: 5.0000 mg | ORAL_TABLET | Freq: Every day | ORAL | 1 refills | Status: DC

## 2024-02-07 ENCOUNTER — Encounter: Payer: Self-pay | Admitting: Family Medicine

## 2024-02-07 ENCOUNTER — Ambulatory Visit: Payer: Self-pay | Attending: Family Medicine | Admitting: Family Medicine

## 2024-02-07 VITALS — BP 127/75 | HR 65 | Ht 72.0 in | Wt 247.2 lb

## 2024-02-07 DIAGNOSIS — I69398 Other sequelae of cerebral infarction: Secondary | ICD-10-CM

## 2024-02-07 DIAGNOSIS — Z8673 Personal history of transient ischemic attack (TIA), and cerebral infarction without residual deficits: Secondary | ICD-10-CM

## 2024-02-07 DIAGNOSIS — I10 Essential (primary) hypertension: Secondary | ICD-10-CM

## 2024-02-07 DIAGNOSIS — I739 Peripheral vascular disease, unspecified: Secondary | ICD-10-CM

## 2024-02-07 DIAGNOSIS — F17210 Nicotine dependence, cigarettes, uncomplicated: Secondary | ICD-10-CM

## 2024-02-07 MED ORDER — AMLODIPINE BESYLATE 5 MG PO TABS
5.0000 mg | ORAL_TABLET | Freq: Every day | ORAL | 1 refills | Status: DC
Start: 2024-02-07 — End: 2024-06-19

## 2024-02-07 MED ORDER — ATORVASTATIN CALCIUM 40 MG PO TABS
40.0000 mg | ORAL_TABLET | Freq: Every day | ORAL | 1 refills | Status: DC
Start: 2024-02-07 — End: 2024-07-16

## 2024-02-07 NOTE — Patient Instructions (Addendum)
 VISIT SUMMARY:  During today's visit, we discussed your ongoing challenges with walking and physical activities due to residual effects from your stroke and chronic pain from previous back surgeries. We also reviewed your efforts to increase physical activity and your desire to lose weight. Additionally, we talked about your cardiovascular health, including concerns about atrial fibrillation and the suggestion to use a smartwatch for monitoring.  YOUR PLAN:  -POST-STROKE SEQUELAE: Post-stroke sequelae refer to the lasting effects after a stroke, such as fatigue and coordination issues. To help improve your stamina and support weight loss, you should gradually increase your physical activity, aiming for 150 minutes of moderate exercise per week. Break this into manageable sessions, like three 20-minute sessions per day.  -PERIPHERAL VASCULAR DISEASE (PVD): Peripheral Vascular Disease is a condition where narrowed blood vessels reduce blood flow to the limbs, causing pain and difficulty walking. Continue taking Aspirin, Amlodipine, and Atorvastatin as prescribed, and report any significant changes in your symptoms.  -HYPERTENSION (HTN): Hypertension, or high blood pressure, is a condition that can lead to serious health issues if not managed. Continue taking Amlodipine 5 mg daily and monitor your blood pressure regularly to keep it under control.    -GENERAL HEALTH MAINTENANCE: Maintain a healthy lifestyle through regular exercise and monitoring your cardiovascular health. Continue with routine blood work to keep track of your cholesterol and diabetes risk, and adhere to your prescribed medications and lifestyle changes.  INSTRUCTIONS:  Please continue with your current medications and gradually increase your physical activity as discussed. Consider obtaining a smartwatch for heart rhythm monitoring if it is feasible for you. Follow up with your cardiologist regularly and report any significant  changes in your symptoms.

## 2024-02-07 NOTE — Progress Notes (Signed)
 Subjective:  Patient ID: Cody Baird, male    DOB: January 18, 1961  Age: 63 y.o. MRN: [id number]  CC: Medical Management of Chronic Issues     Discussed the use of AI scribe software for clinical note transcription with the patient, who gave verbal consent to proceed.  History of Present Illness The patient, with a history of tobacco abuse (quit after 40 years of smoking greater than 20-pack-year), PAD s/p R LE bypass graft diagnosed with right MCA stroke in 01/2020 (he presented outside the window for TPA), status post amputation of right lateral 3 toes, Prediabetes (A1c 6.0), nicotine dependence (quit in 2023) , presents with difficulty walking long distances and performing activities he used to enjoy, such as golf, due to residual weakness and coordination issues. He also has a history of three back surgeries and a physically demanding career in [employer], which has resulted in chronic pain and physical limitations. Despite these challenges, he has been making efforts to increase his physical activity, using a treadmill for 15-minute sessions, with a goal to gradually increase to 20-minute sessions three times a day. He also expresses a desire to lose weight.  The patient has been seen by a cardiologist, Dr. Norman Herrlich, who suggested he get an Apple watch to monitor his heart rate and steps, due to concerns about monitoring for atrial fibrillation (afib) given history of stroke. The patient is resistant to this suggestion, as well as a previous suggestion for an implantable heart monitor, due to cost and perceived lack of necessity. He reports wearing a heart monitor for a month after his stroke, which did not show any abnormalities.    Past Medical History:  Diagnosis Date   Acute right MCA stroke (HCC) 01/30/2020   Aftercare following surgery of the circulatory system, NEC 01/14/2014   Arthritis    low back and left hip   Atherosclerosis of native arteries of the extremities with  ulceration (HCC) 07/16/2013   IMO SNOMED Dx Update Oct 2024     Atherosclerosis of native arteries of the extremities with ulceration(440.23) 07/16/2013   Atherosclerotic peripheral vascular disease with rest pain (HCC) 10/30/2013   B12 deficiency    Bradycardia    Chronic back pain    CVA (cerebral vascular accident) (HCC) 01/28/2020   Essential hypertension 01/28/2020   History of bronchitis    last time about 2-102yrs ago   Ischemic pain of foot 12/24/2013   PAD (peripheral artery disease) (HCC)    Pain in limb 06/25/2013   PBA (pseudobulbar affect)    Peripheral vascular disease (HCC)    Peripheral vascular disease, unspecified (HCC) 06/25/2013   Post-op pain 09/17/2013   Post-operative pain 07/16/2013   Prolapsed, anus    hx of   PVD (peripheral vascular disease) (HCC) 09/17/2013   Stroke (HCC) 01/2020   TIA (transient ischemic attack) 01/28/2020   Tobacco abuse 01/28/2020   Venous stasis ulcer of ankle, right (HCC) 02/17/2020    Past Surgical History:  Procedure Laterality Date   ABDOMINAL AORTAGRAM N/A 06/26/2013   Procedure: ABDOMINAL AORTAGRAM;  Surgeon: Larina Earthly, MD;  Location: [facility] CATH LAB;  Service: Cardiovascular;  Laterality: N/A;   AMPUTATION Right 12/04/2013   Procedure: AMPUTATION DIGIT- 3RD TOE RIGHT FOOT ;  Surgeon: Larina Earthly, MD;  Location: [facility] OR;  Service: Vascular;  Laterality: Right;   AMPUTATION Right 01/15/2014   Procedure: AMPUTATION OF RIGHT 4TH and 5TH TOES;  Surgeon: Larina Earthly, MD;  Location: [facility] OR;  Service:  Vascular;  Laterality: Right;   BACK SURGERY     L1-2   BACK SURGERY     L1-2   BACK SURGERY     thinks L1- 2   COLON SURGERY     removal of colon due to proplase rectum   COLOSTOMY     COLOSTOMY CLOSURE  2006ish   ESOPHAGOGASTRODUODENOSCOPY     FEMORAL-TIBIAL BYPASS GRAFT Right 06/28/2013   Procedure: BYPASS GRAFT FEMORAL TO TIBIAL PERONEAL TRUNK ARTERY;  Surgeon: Larina Earthly, MD;  Location[facility] OR;  Service: Vascular;  Laterality:  Right;    Family History  Problem Relation Age of Onset   Cancer Mother    Diabetes Mother    Diabetes Father    Hyperlipidemia Father    Hypertension Father    Heart attack Father     Social History   Socioeconomic History   Marital status: Divorced    Spouse name: Not on file   Number of children: Not on file   Years of education: Not on file   Highest education level: Not on file  Occupational History   Not on file  Tobacco Use   Smoking status: Former    Current packs/day: 0.00    Average packs/day: 0.5 packs/day for 36.0 years (18.0 ttl pk-yrs)    Types: Cigarettes    Start date: 01/27/1984    Quit date: 01/27/2020    Years since quitting: 4.0   Smokeless tobacco: Never  Substance and Sexual Activity   Alcohol use: No    Alcohol/week: 0.0 standard drinks of alcohol   Drug use: No   Sexual activity: Not on file  Other Topics Concern   Not on file  Social History Narrative   Not on file   Social Drivers of Health   Financial Resource Strain: Not on file  Food Insecurity: Not on file  Transportation Needs: Not on file  Physical Activity: Not on file  Stress: Not on file  Social Connections: Not on file    Allergies  Allergen Reactions   Penicillins Other (See Comments)    Outpatient Medications Prior to Visit  Medication Sig Dispense Refill   aspirin EC 81 MG tablet Take 1 tablet (81 mg total) by mouth daily. Swallow whole. 90 tablet 3   diphenhydrAMINE (BENADRYL) 25 MG tablet Take 25-50 mg by mouth every 6 (six) hours as needed for itching or allergies.     amLODipine (NORVASC) 5 MG tablet Take 1 tablet (5 mg total) by mouth daily. 90 tablet 1   atorvastatin (LIPITOR) 40 MG tablet Take 1 tablet (40 mg total) by mouth daily. 90 tablet 1   No facility-administered medications prior to visit.     ROS Review of Systems  Constitutional:  Negative for activity change and appetite change.  HENT:  Negative for sinus pressure and sore throat.    Respiratory:  Negative for chest tightness, shortness of breath and wheezing.   Cardiovascular:  Negative for chest pain and palpitations.  Gastrointestinal:  Negative for abdominal distention, abdominal pain and constipation.  Genitourinary: Negative.   Musculoskeletal: Negative.   Psychiatric/Behavioral:  Negative for behavioral problems and dysphoric mood.     Objective:  BP 127/75   Pulse 65   Ht 6' (1.829 m)   Wt 247 lb 3.2 oz (112.1 kg)   SpO2 96%   BMI 33.53 kg/m      02/07/2024    1:58 PM 01/10/2024   10:08 AM 01/10/2024    9:45 AM  BP/Weight  Systolic BP 127 126 132  Diastolic BP 75 80 90  Wt. (Lbs) 247.2  244.6  BMI 33.53 kg/m2  33.17 kg/m2      Physical Exam Constitutional:      Appearance: He is well-developed.  Cardiovascular:     Rate and Rhythm: Normal rate.     Heart sounds: Normal heart sounds. No murmur heard. Pulmonary:     Effort: Pulmonary effort is normal.     Breath sounds: Normal breath sounds. No wheezing or rales.  Chest:     Chest wall: No tenderness.  Abdominal:     General: Bowel sounds are normal. There is no distension.     Palpations: Abdomen is soft. There is no mass.     Tenderness: There is no abdominal tenderness.  Musculoskeletal:        General: Normal range of motion.     Right lower leg: No edema.     Left lower leg: No edema.  Neurological:     Mental Status: He is alert and oriented to person, place, and time.  Psychiatric:        Mood and Affect: Mood normal.        Latest Ref Rng & Units 12/05/2023   10:35 AM 12/05/2023   10:27 AM 08/10/2023    4:36 PM  CMP  Glucose 70 - 99 mg/dL 409  811  914   BUN 8 - 23 mg/dL 11  10  13    Creatinine 0.61 - 1.24 mg/dL 7.82  9.56  2.13   Sodium 135 - 145 mmol/L 139  138  141   Potassium 3.5 - 5.1 mmol/L 3.7  3.9  4.3   Chloride 98 - 111 mmol/L 101  102  102   CO2 22 - 32 mmol/L  25  21   Calcium 8.9 - 10.3 mg/dL  08.6  9.9   Total Protein 6.5 - 8.1 g/dL  7.2    Total  Bilirubin 0.0 - 1.2 mg/dL  0.6    Alkaline Phos 38 - 126 U/L  63    AST 15 - 41 U/L  21    ALT 0 - 44 U/L  24      Lipid Panel     Component Value Date/Time   CHOL 119 01/10/2024 1018   TRIG 109 01/10/2024 1018   HDL 44 01/10/2024 1018   CHOLHDL 2.7 01/10/2024 1018   CHOLHDL 6.6 01/28/2020 0612   VLDL 31 01/28/2020 0612   LDLCALC 55 01/10/2024 1018    CBC    Component Value Date/Time   WBC 7.3 12/05/2023 1027   RBC 5.38 12/05/2023 1027   HGB 17.7 (H) 12/05/2023 1035   HCT 52.0 12/05/2023 1035   PLT 178 12/05/2023 1027   MCV 95.0 12/05/2023 1027   MCH 31.4 12/05/2023 1027   MCHC 33.1 12/05/2023 1027   RDW 12.6 12/05/2023 1027   LYMPHSABS 2.0 01/31/2020 0504   MONOABS 0.9 01/31/2020 0504   EOSABS 0.3 01/31/2020 0504   BASOSABS 0.0 01/31/2020 0504    Lab Results  Component Value Date   HGBA1C 5.6 08/10/2023       Assessment & Plan Post-stroke sequelae Experiences fatigue and coordination issues affecting foot and leg, challenging walking. Increasing physical activity to improve stamina and weight loss. - Encourage gradual increase in physical activity to achieve 150 minutes of moderate exercise per week. - Advise breaking exercise into manageable sessions, such as three 20-minute sessions per day.  Peripheral Vascular Disease (  PVD) PVD causes difficulty walking long distances and chronic foot pain. Managed with medication and lifestyle changes. - Continue Aspirin, Amlodipine, and Atorvastatin. - Report any significant changes in symptoms.  Hypertension (HTN) Blood pressure managed with Amlodipine, crucial for stroke and cardiovascular risk control. - Continue Amlodipine 5 mg orally daily. - Monitor blood pressure regularly.  History of stroke Potential Afib concern by cardiology, previous monitoring showed no arrhythmias. Advised to manage blood pressure and cholesterol. Open to smartwatch monitoring. - Consider obtaining a smartwatch for heart rhythm  monitoring if feasible. - Continue regular follow-up with cardiologist. -Continue statin  General Health Maintenance Encouraged to maintain healthy lifestyle through exercise and cardiovascular risk monitoring. Routine health checks discussed. - Continue regular blood work to monitor cholesterol and diabetes risk. - Adhere to prescribed medications and lifestyle modifications.      Meds ordered this encounter  Medications   amLODipine (NORVASC) 5 MG tablet    Sig: Take 1 tablet (5 mg total) by mouth daily.    Dispense:  90 tablet    Refill:  1   atorvastatin (LIPITOR) 40 MG tablet    Sig: Take 1 tablet (40 mg total) by mouth daily.    Dispense:  90 tablet    Refill:  1    Follow-up: Return in about 6 months (around 08/09/2024).       Hoy Register, MD, FAAFP. [facility] [place], [place] [phone]   02/07/2024, 2:25 PM

## 2024-06-16 ENCOUNTER — Inpatient Hospital Stay (HOSPITAL_COMMUNITY)
Admission: EM | Admit: 2024-06-16 | Discharge: 2024-06-19 | DRG: 308 | Disposition: A | Discharge status: Home / Self Care | Payer: Self-pay | Source: Other Acute Inpatient Hospital | Attending: Family Medicine | Admitting: Family Medicine

## 2024-06-16 ENCOUNTER — Ambulatory Visit (HOSPITAL_BASED_OUTPATIENT_CLINIC_OR_DEPARTMENT_OTHER)
Admission: EM | Admit: 2024-06-16 | Discharge: 2024-06-16 | Disposition: A | Discharge status: Other Acute Inpatient Hospital | Payer: Self-pay

## 2024-06-16 ENCOUNTER — Encounter (HOSPITAL_COMMUNITY): Payer: Self-pay | Admitting: Emergency Medicine

## 2024-06-16 ENCOUNTER — Emergency Department (HOSPITAL_COMMUNITY): Payer: Self-pay

## 2024-06-16 ENCOUNTER — Other Ambulatory Visit: Payer: Self-pay

## 2024-06-16 ENCOUNTER — Encounter (HOSPITAL_BASED_OUTPATIENT_CLINIC_OR_DEPARTMENT_OTHER): Payer: Self-pay

## 2024-06-16 DIAGNOSIS — Z6834 Body mass index (BMI) 34.0-34.9, adult: Secondary | ICD-10-CM

## 2024-06-16 DIAGNOSIS — Z79899 Other long term (current) drug therapy: Secondary | ICD-10-CM

## 2024-06-16 DIAGNOSIS — M549 Dorsalgia, unspecified: Secondary | ICD-10-CM | POA: Diagnosis present

## 2024-06-16 DIAGNOSIS — Z8673 Personal history of transient ischemic attack (TIA), and cerebral infarction without residual deficits: Secondary | ICD-10-CM

## 2024-06-16 DIAGNOSIS — E66812 Obesity, class 2: Secondary | ICD-10-CM | POA: Diagnosis present

## 2024-06-16 DIAGNOSIS — J439 Emphysema, unspecified: Secondary | ICD-10-CM | POA: Diagnosis present

## 2024-06-16 DIAGNOSIS — I4891 Unspecified atrial fibrillation: Secondary | ICD-10-CM

## 2024-06-16 DIAGNOSIS — E785 Hyperlipidemia, unspecified: Secondary | ICD-10-CM | POA: Diagnosis present

## 2024-06-16 DIAGNOSIS — E1151 Type 2 diabetes mellitus with diabetic peripheral angiopathy without gangrene: Secondary | ICD-10-CM | POA: Diagnosis present

## 2024-06-16 DIAGNOSIS — Z7901 Long term (current) use of anticoagulants: Secondary | ICD-10-CM

## 2024-06-16 DIAGNOSIS — Z833 Family history of diabetes mellitus: Secondary | ICD-10-CM

## 2024-06-16 DIAGNOSIS — Z83438 Family history of other disorder of lipoprotein metabolism and other lipidemia: Secondary | ICD-10-CM

## 2024-06-16 DIAGNOSIS — Z7982 Long term (current) use of aspirin: Secondary | ICD-10-CM

## 2024-06-16 DIAGNOSIS — I110 Hypertensive heart disease with heart failure: Secondary | ICD-10-CM | POA: Diagnosis present

## 2024-06-16 DIAGNOSIS — Z87891 Personal history of nicotine dependence: Secondary | ICD-10-CM

## 2024-06-16 DIAGNOSIS — Z809 Family history of malignant neoplasm, unspecified: Secondary | ICD-10-CM

## 2024-06-16 DIAGNOSIS — I739 Peripheral vascular disease, unspecified: Secondary | ICD-10-CM | POA: Diagnosis present

## 2024-06-16 DIAGNOSIS — I4819 Other persistent atrial fibrillation: Principal | ICD-10-CM | POA: Diagnosis present

## 2024-06-16 DIAGNOSIS — I10 Essential (primary) hypertension: Secondary | ICD-10-CM | POA: Diagnosis present

## 2024-06-16 DIAGNOSIS — Z8249 Family history of ischemic heart disease and other diseases of the circulatory system: Secondary | ICD-10-CM

## 2024-06-16 DIAGNOSIS — Z5971 Insufficient health insurance coverage: Secondary | ICD-10-CM

## 2024-06-16 DIAGNOSIS — Z5982 Transportation insecurity: Secondary | ICD-10-CM

## 2024-06-16 DIAGNOSIS — G8929 Other chronic pain: Secondary | ICD-10-CM | POA: Diagnosis present

## 2024-06-16 DIAGNOSIS — Z880 Allergy status to penicillin: Secondary | ICD-10-CM

## 2024-06-16 DIAGNOSIS — Z89421 Acquired absence of other right toe(s): Secondary | ICD-10-CM

## 2024-06-16 DIAGNOSIS — I5021 Acute systolic (congestive) heart failure: Secondary | ICD-10-CM | POA: Diagnosis present

## 2024-06-16 DIAGNOSIS — I509 Heart failure, unspecified: Secondary | ICD-10-CM

## 2024-06-16 DIAGNOSIS — Z95820 Peripheral vascular angioplasty status with implants and grafts: Secondary | ICD-10-CM

## 2024-06-16 HISTORY — DX: Personal history of transient ischemic attack (TIA), and cerebral infarction without residual deficits: Z86.73

## 2024-06-16 HISTORY — DX: Hyperlipidemia, unspecified: E78.5

## 2024-06-16 HISTORY — DX: Unspecified atrial fibrillation: I48.91

## 2024-06-16 LAB — BASIC METABOLIC PANEL WITH GFR
Anion gap: 8 (ref 5–15)
BUN: 14 mg/dL (ref 8–23)
CO2: 19 mmol/L — ABNORMAL LOW (ref 22–32)
Calcium: 9.1 mg/dL (ref 8.9–10.3)
Chloride: 109 mmol/L (ref 98–111)
Creatinine, Ser: 0.93 mg/dL (ref 0.61–1.24)
GFR, Estimated: >60 mL/min (ref >60)
Glucose, Bld: 90 mg/dL (ref 70–99)
Potassium: 5 mmol/L (ref 3.5–5.1)
Sodium: 136 mmol/L (ref 135–145)

## 2024-06-16 LAB — CBC
HCT: 54 % — ABNORMAL HIGH (ref 39.0–52.0)
Hemoglobin: 17.2 g/dL — ABNORMAL HIGH (ref 13.0–17.0)
MCH: 29.8 pg (ref 26.0–34.0)
MCHC: 31.9 g/dL (ref 30.0–36.0)
MCV: 93.4 fL (ref 80.0–100.0)
Platelets: 158 K/uL (ref 150–400)
RBC: 5.78 MIL/uL (ref 4.22–5.81)
RDW: 13.6 % (ref 11.5–15.5)
WBC: 8.8 K/uL (ref 4.0–10.5)
nRBC: 0 % (ref 0.0–0.2)

## 2024-06-16 LAB — TSH: TSH: 2.162 u[IU]/mL (ref 0.350–4.500)

## 2024-06-16 LAB — BRAIN NATRIURETIC PEPTIDE: B Natriuretic Peptide: 257.2 pg/mL — ABNORMAL HIGH (ref 0.0–100.0)

## 2024-06-16 LAB — D-DIMER, QUANTITATIVE: D-Dimer, Quant: 0.35 ug{FEU}/mL (ref 0.00–0.50)

## 2024-06-16 LAB — MAGNESIUM: Magnesium: 2.2 mg/dL (ref 1.7–2.4)

## 2024-06-16 LAB — TROPONIN I (HIGH SENSITIVITY): Troponin I (High Sensitivity): 9 ng/L (ref <18)

## 2024-06-16 MED ORDER — DILTIAZEM HCL 30 MG PO TABS
30.0000 mg | ORAL_TABLET | Freq: Four times a day (QID) | ORAL | Status: DC
  Administered 2024-06-16: 30 mg via ORAL
  Filled 2024-06-16 (×4): qty 1

## 2024-06-16 MED ORDER — DILTIAZEM HCL-DEXTROSE 125-5 MG/125ML-% IV SOLN (PREMIX): 5.0000 mg/h | INTRAVENOUS | Status: DC

## 2024-06-16 MED ORDER — DILTIAZEM HCL-DEXTROSE 125-5 MG/125ML-% IV SOLN (PREMIX)
5.0000 mg/h | INTRAVENOUS | Status: DC
  Administered 2024-06-16: 5 mg/h via INTRAVENOUS
  Administered 2024-06-17: 12.5 mg/h via INTRAVENOUS
  Administered 2024-06-17: 10 mg/h via INTRAVENOUS
  Filled 2024-06-16 (×3): qty 125

## 2024-06-16 MED ORDER — DILTIAZEM HCL 25 MG/5ML IV SOLN
20.0000 mg | Freq: Once | INTRAVENOUS | Status: AC
  Administered 2024-06-16: 20 mg via INTRAVENOUS
  Filled 2024-06-16: qty 5

## 2024-06-16 MED ORDER — LEVALBUTEROL HCL 0.63 MG/3ML IN NEBU
0.6300 mg | INHALATION_SOLUTION | Freq: Four times a day (QID) | RESPIRATORY_TRACT | Status: DC | PRN
  Administered 2024-06-18: .63 mg via RESPIRATORY_TRACT
  Administered 2024-06-19: 0.63 mg via RESPIRATORY_TRACT
  Filled 2024-06-16: qty 3

## 2024-06-16 MED ORDER — ATORVASTATIN CALCIUM 40 MG PO TABS
40.0000 mg | ORAL_TABLET | Freq: Every day | ORAL | Status: DC
  Administered 2024-06-17 – 2024-06-19 (×3): 40 mg via ORAL
  Filled 2024-06-16 (×3): qty 1

## 2024-06-16 MED ORDER — LACTATED RINGERS IV BOLUS: 1000.0000 mL | Freq: Once | INTRAVENOUS | Status: DC

## 2024-06-16 MED ORDER — METOPROLOL TARTRATE 5 MG/5ML IV SOLN: 5.0000 mg | INTRAVENOUS | Status: DC | PRN

## 2024-06-16 MED ORDER — ASPIRIN 81 MG PO TBEC
81.0000 mg | DELAYED_RELEASE_TABLET | Freq: Every day | ORAL | Status: DC
Start: 2024-06-17 — End: 2024-06-19
  Administered 2024-06-17 – 2024-06-19 (×3): 81 mg via ORAL
  Filled 2024-06-16 (×3): qty 1

## 2024-06-16 MED ORDER — SODIUM CHLORIDE 0.9% FLUSH
3.0000 mL | Freq: Two times a day (BID) | INTRAVENOUS | Status: DC
  Administered 2024-06-16 – 2024-06-19 (×5): 3 mL via INTRAVENOUS

## 2024-06-16 MED ORDER — LACTATED RINGERS IV BOLUS
500.0000 mL | Freq: Once | INTRAVENOUS | Status: AC
  Administered 2024-06-16: 500 mL via INTRAVENOUS

## 2024-06-16 MED ORDER — SENNOSIDES-DOCUSATE SODIUM 8.6-50 MG PO TABS: 1.0000 | ORAL_TABLET | Freq: Every evening | ORAL | Status: DC | PRN

## 2024-06-16 MED ORDER — ACETAMINOPHEN 325 MG PO TABS
650.0000 mg | ORAL_TABLET | Freq: Four times a day (QID) | ORAL | Status: DC | PRN
  Administered 2024-06-16 – 2024-06-19 (×5): 650 mg via ORAL
  Filled 2024-06-16 (×5): qty 2

## 2024-06-16 MED ORDER — DILTIAZEM LOAD VIA INFUSION: 20.0000 mg | Freq: Once | INTRAVENOUS | Status: DC

## 2024-06-16 MED ORDER — APIXABAN 5 MG PO TABS
5.0000 mg | ORAL_TABLET | Freq: Two times a day (BID) | ORAL | Status: DC
  Administered 2024-06-16 – 2024-06-17 (×2): 5 mg via ORAL
  Filled 2024-06-16 (×2): qty 1

## 2024-06-16 MED ORDER — LORAZEPAM 0.5 MG PO TABS
0.5000 mg | ORAL_TABLET | Freq: Once | ORAL | Status: AC
  Administered 2024-06-16: 0.5 mg via ORAL
  Filled 2024-06-16: qty 1

## 2024-06-16 MED ORDER — ACETAMINOPHEN 650 MG RE SUPP: 650.0000 mg | Freq: Four times a day (QID) | RECTAL | Status: DC | PRN

## 2024-06-16 MED ORDER — ONDANSETRON HCL 4 MG/2ML IJ SOLN: 4.0000 mg | Freq: Four times a day (QID) | INTRAMUSCULAR | Status: DC | PRN

## 2024-06-16 MED ORDER — ONDANSETRON HCL 4 MG PO TABS: 4.0000 mg | ORAL_TABLET | Freq: Four times a day (QID) | ORAL | Status: DC | PRN

## 2024-06-16 NOTE — ED Triage Notes (Signed)
 Per [facility] pt coming from [facility]- went there due to shortness of breath while walking to mailbox. Patient states the shortness of breath has been coming and going for quit a while.  At [facility] noted to be in a-fib RVR. Denies hx of a-fib. Given 10 mg cardizem  en route. Patient A&ox4.

## 2024-06-16 NOTE — ED Notes (Signed)
 Dr. Maple Hudson at bedside

## 2024-06-16 NOTE — H&P (Addendum)
 History and Physical    Cody Baird FMW:[id number] DOB: 09-Dec-1960 DOA: 06/16/2024  PCP: Delbert Clam, MD  Patient coming from: Home  I have personally briefly reviewed patient's old medical records in [facility]  Chief Complaint: Dyspnea on exertion, palpitations  HPI: Cody Baird is a 63 y.o. male with medical history significant for right MCA stroke (01/2020), PAD s/p RLE bypass, HTN, HLD, former tobacco use who presented to the ED for evaluation of palpitations and dyspnea on exertion.  Patient notes that over the last 3 months he has had new exertional dyspnea, initially mild symptoms.  Over the last month he thought he had been dealing with a sinus infection or bronchitis due to cough and continuing exertional dyspnea.  Over the last 2 weeks he has had worsening symptoms.  Now having significant shortness of breath when walking short distances such as checking his mail.  He says last Thursday he went and got groceries.  After 3 trips to the car and back into his home to unload his groceries he was very short of breath.  He notes having some chest tightness at the time as well as new palpitations.  Patient initially went to urgent care today for further evaluation.  In urgent care he was noted to be in A-fib with RVR with heart rate 140s.  EMS were called for emergency transport to [facility].  He was given IV diltiazem  10 mg en route to the ED.  Patient is a former heavy smoker.  He quit in March 2021 after he had a stroke.  He denies excess alcohol or caffeine use.  He reports a history of MI and A-fib in his father.  ED Course  Labs/Imaging on admission: I have personally reviewed following labs and imaging studies.  Initial vitals showed BP 128/107, pulse 129, RR 23, temp 97.9 F, SpO2 97% on room air.  Labs showed WBC 8.8, hemoglobin 17.2, platelets 158, sodium 136, potassium 5.0, bicarb 19, BUN 14, creatinine 0.93, serum glucose 90, troponin 9, D-dimer 0.35, BNP  257.2.  Portable chest x-ray showed mild diffuse increased interstitial markings with bilateral lower lobe predominance.  Patient was given 500 cc LR and IV diltiazem  20 mg.  EDP discussed with cardiology who will see in consultation.  The hospitalist service was consulted to admit.  Review of Systems: All systems reviewed and are negative except as documented in history of present illness above.   Past Medical History:  Diagnosis Date   Acute right MCA stroke (HCC) 01/30/2020   Aftercare following surgery of the circulatory system, NEC 01/14/2014   Arthritis    low back and left hip   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   IMO SNOMED Dx Update Oct 2024     Atherosclerosis of native arteries of the extremities with ulceration(440.23) 07/16/2013   Atherosclerotic peripheral vascular disease with rest pain (HCC) 10/30/2013   B12 deficiency    Bradycardia    Chronic back pain    CVA (cerebral vascular accident) (HCC) 01/28/2020   Essential hypertension 01/28/2020   History of bronchitis    last time about 2-73yrs ago   Ischemic pain of foot 12/24/2013   PAD (peripheral artery disease) (HCC)    Pain in limb 06/25/2013   PBA (pseudobulbar affect)    Peripheral vascular disease (HCC)    Peripheral vascular disease, unspecified (HCC) 06/25/2013   Post-op pain 09/17/2013   Post-operative pain 07/16/2013   Prolapsed, anus  hx of   PVD (peripheral vascular disease) (HCC) 09/17/2013   Stroke (HCC) 01/2020   TIA (transient ischemic attack) 01/28/2020   Tobacco abuse 01/28/2020   Venous stasis ulcer of ankle, right (HCC) 02/17/2020    Past Surgical History:  Procedure Laterality Date   ABDOMINAL AORTAGRAM N/A 06/26/2013   Procedure: ABDOMINAL AORTAGRAM;  Surgeon: Krystal JULIANNA Doing, MD;  Location: [facility] CATH LAB;  Service: Cardiovascular;  Laterality: N/A;   AMPUTATION Right 12/04/2013   Procedure: AMPUTATION DIGIT- 3RD TOE RIGHT FOOT ;   Surgeon: Krystal JULIANNA Doing, MD;  Location: [facility] OR;  Service: Vascular;  Laterality: Right;   AMPUTATION Right 01/15/2014   Procedure: AMPUTATION OF RIGHT 4TH and 5TH TOES;  Surgeon: Krystal JULIANNA Doing, MD;  Location: [facility] OR;  Service: Vascular;  Laterality: Right;   BACK SURGERY     L1-2   BACK SURGERY     L1-2   BACK SURGERY     thinks L1- 2   COLON SURGERY     removal of colon due to proplase rectum   COLOSTOMY     COLOSTOMY CLOSURE  2006ish   ESOPHAGOGASTRODUODENOSCOPY     FEMORAL-TIBIAL BYPASS GRAFT Right 06/28/2013   Procedure: BYPASS GRAFT FEMORAL TO TIBIAL PERONEAL TRUNK ARTERY;  Surgeon: Krystal JULIANNA Doing, MD;  Location: [facility] OR;  Service: Vascular;  Laterality: Right;    Social History: Patient is a former heavy smoker.  He quit in March 2021 after he had a stroke.  He denies excess alcohol or caffeine use.  He reports a history of MI and A-fib in his father.  Allergies  Allergen Reactions   Penicillins Other (See Comments)    Family History  Problem Relation Age of Onset   Cancer Mother    Diabetes Mother    Diabetes Father    Hyperlipidemia Father    Hypertension Father    Heart attack Father      Prior to Admission medications   Medication Sig Start Date End Date Taking? Authorizing Provider  amLODipine  (NORVASC ) 5 MG tablet Take 1 tablet (5 mg total) by mouth daily. 02/07/24   Newlin, Enobong, MD  apixaban  (ELIQUIS ) 5 MG TABS tablet Take 5 mg by mouth 2 (two) times daily.    [provider]  aspirin  EC 81 MG tablet Take 1 tablet (81 mg total) by mouth daily. Swallow whole. 06/16/22   Monetta Redell PARAS, MD  atorvastatin  (LIPITOR) 40 MG tablet Take 1 tablet (40 mg total) by mouth daily. 02/07/24   Newlin, Enobong, MD  diphenhydrAMINE  (BENADRYL ) 25 MG tablet Take 25-50 mg by mouth every 6 (six) hours as needed for itching or allergies.    [provider]    Physical Exam: Vitals:   06/16/24 1615 06/16/24 1623 06/16/24 1700 06/16/24 1858  BP: 121/85  (!) 122/92    Pulse: 100 (!) 133 93   Resp: 18  15   Temp:    98.2 F (36.8 C)  TempSrc:    Oral  SpO2: 94%  95%   Weight:      Height:       Constitutional: Resting in bed with head elevated, NAD, calm, comfortable Eyes: EOMI, lids and conjunctivae normal ENMT: Mucous membranes are moist. Posterior pharynx clear of any exudate or lesions.Normal dentition.  Neck: normal, supple, no masses. Respiratory: Expiratory wheezing bilaterally. Normal respiratory effort. No accessory muscle use.  Cardiovascular: Irregularly irregular and tachycardic, no murmurs / rubs / gallops. No extremity edema. 2+ pedal pulses. Abdomen: no  tenderness, no masses palpated. Musculoskeletal: no clubbing / cyanosis. Good ROM, no contractures. Normal muscle tone.  Skin: no rashes, lesions, ulcers. No induration Neurologic: Sensation intact. Strength 5/5 in all 4.  Psychiatric: Normal judgment and insight. Alert and oriented x 3. Normal mood.   EKG: Personally reviewed.  Atrial fibrillation with RVR, rate 131. Previous EKG showed sinus rhythm, rate 69 on January 10, 2024.  Assessment/Plan Principal Problem:   Atrial fibrillation with RVR (HCC) Active Problems:   Essential hypertension   PAD (peripheral artery disease) (HCC)   History of stroke   Hyperlipidemia   Cody Baird is a 63 y.o. male with medical history significant for right MCA stroke (01/2020), PAD s/p RLE bypass, HTN, HLD, former tobacco use who is admitted with new diagnosis of A-fib with RVR.  Assessment and Plan: Atrial fibrillation with RVR, new diagnosis: Received IV diltiazem  in the ED with mild improvement in heart rate.  HR mostly varying between 90s-120s at time of admission.  CHA2DS2-VASc is at least 4.  Discussed starting anticoagulation for stroke risk reduction, patient agrees.  CXR findings likely interstitial edema related to tachyarrhythmia.  BNP 257.3. - Start oral diltiazem  30 mg every 6 hours - If rates remain uncontrolled then will need  to start on IV diltiazem  drip - Start Eliquis  5 mg twice daily - Obtain echocardiogram - Cardiology to consult  History of right MCA stroke: Without residual deficit.  Continue aspirin  and statin.  Started on Eliquis  as above.  Hypertension: Holding home amlodipine .  Started on diltiazem  as above.  Hyperlipidemia: Continue atorvastatin .  PAD: Continue aspirin , statin.  Started on Eliquis  as above.  Former tobacco use: Patient reports former heavy tobacco use, quit in March 2021 after a stroke.  Wheezing noted on exam, may have some component of underlying COPD.  Can use Xopenex  nebulizers as needed.   DVT prophylaxis:  apixaban  (ELIQUIS ) tablet 5 mg   Code Status: Full code, confirmed with patient on admission Family Communication: Discussed with patient, he has discussed with family Disposition Plan: From home and likely discharge to home pending clinical progress Consults called: Cardiology Severity of Illness: The appropriate patient status for this patient is OBSERVATION. Observation status is judged to be reasonable and necessary in order to provide the required intensity of service to ensure the patient's safety. The patient's presenting symptoms, physical exam findings, and initial radiographic and laboratory data in the context of their medical condition is felt to place them at decreased risk for further clinical deterioration. Furthermore, it is anticipated that the patient will be medically stable for discharge from the hospital within 2 midnights of admission.   Jorie Blanch MD [facility]  If 7PM-7AM, please contact night-coverage [email or web address]  06/16/2024, 7:51 PM

## 2024-06-16 NOTE — Hospital Course (Signed)
 Cody Baird is a 63 y.o. male with medical history significant for right MCA stroke (01/2020), PAD s/p RLE bypass, HTN, HLD, former tobacco use who is admitted with new diagnosis of A-fib with RVR.

## 2024-06-16 NOTE — Discharge Instructions (Addendum)
 ER per EMS

## 2024-06-16 NOTE — ED Provider Notes (Signed)
 [place] EMERGENCY DEPARTMENT AT [facility] Provider Note   CSN: [id number] Arrival date & time: 06/16/24  1423     Patient presents with: Shortness of Breath   Cody Baird is a 63 y.o. male.   63 year old male presenting emergency department for A-fib RVR/shortness of breath.  Reports that he has had worsening dyspnea on exertion for the past couple months with some intermittent episodes of feeling his heart beating quickly.  Symptoms seemingly acutely worsened the past 3 days, went to urgent care was found to be in A-fib RVR.  Not having lightheadedness, chest pain, shortness of breath at rest, but does feel that his heart is beating quickly.   Shortness of Breath      Prior to Admission medications   Medication Sig Start Date End Date Taking? Authorizing Provider  amLODipine  (NORVASC ) 5 MG tablet Take 1 tablet (5 mg total) by mouth daily. 02/07/24   Newlin, Enobong, MD  apixaban  (ELIQUIS ) 5 MG TABS tablet Take 5 mg by mouth 2 (two) times daily.    [provider]  aspirin  EC 81 MG tablet Take 1 tablet (81 mg total) by mouth daily. Swallow whole. 06/16/22   Monetta Redell PARAS, MD  atorvastatin  (LIPITOR) 40 MG tablet Take 1 tablet (40 mg total) by mouth daily. 02/07/24   Newlin, Enobong, MD  diphenhydrAMINE  (BENADRYL ) 25 MG tablet Take 25-50 mg by mouth every 6 (six) hours as needed for itching or allergies.    [provider]    Allergies: Penicillins    Review of Systems  Respiratory:  Positive for shortness of breath.     Updated Vital Signs BP (!) 122/92   Pulse 93   Temp 97.9 F (36.6 C)   Resp 15   Ht 6' (1.829 m)   Wt 113.9 kg   SpO2 95%   BMI 34.04 kg/m   Physical Exam Vitals and nursing note reviewed.  Constitutional:      General: He is not in acute distress. HENT:     Head: Normocephalic.     Nose: Nose normal.  Eyes:     Conjunctiva/sclera: Conjunctivae normal.  Cardiovascular:     Rate and Rhythm: Tachycardia present.  Rhythm irregular.     Pulses: Normal pulses.  Pulmonary:     Effort: Pulmonary effort is normal.     Breath sounds: Normal breath sounds. No wheezing, rhonchi or rales.  Abdominal:     General: Abdomen is flat. There is no distension.     Palpations: Abdomen is soft.     Tenderness: There is no abdominal tenderness. There is no guarding or rebound.  Musculoskeletal:     Right lower leg: No edema.     Left lower leg: No edema.  Skin:    General: Skin is warm and dry.     Capillary Refill: Capillary refill takes less than 2 seconds.  Neurological:     Mental Status: He is alert and oriented to person, place, and time.  Psychiatric:        Mood and Affect: Mood normal.        Behavior: Behavior normal.     (all labs ordered are listed, but only abnormal results are displayed) Labs Reviewed  BASIC METABOLIC PANEL WITH GFR - Abnormal; Notable for the following components:      Result Value   CO2 19 (*)    All other components within normal limits  CBC - Abnormal; Notable for the following components:  Hemoglobin 17.2 (*)    HCT 54.0 (*)    All other components within normal limits  BRAIN NATRIURETIC PEPTIDE - Abnormal; Notable for the following components:   B Natriuretic Peptide 257.2 (*)    All other components within normal limits  D-DIMER, QUANTITATIVE  TROPONIN I (HIGH SENSITIVITY)    EKG: EKG Interpretation Date/Time:  Sunday June 16 2024 14:28:47 EDT Ventricular Rate:  131 PR Interval:    QRS Duration:  90 QT Interval:  314 QTC Calculation: 464 R Axis:   261  Text Interpretation: Atrial fibrillation Markedly posterior QRS axis Confirmed by Neysa Clap [phone]) on 06/16/2024 2:56:00 PM  Radiology: DG Chest Portable 1 View Result Date: 06/16/2024 CLINICAL DATA:  Shortness of breath. EXAM: PORTABLE CHEST 1 VIEW COMPARISON:  None Available. FINDINGS: Mild diffuse increased interstitial markings with bilateral lower lobe predominance. Bilateral lung fields are  otherwise clear. Bilateral costophrenic angles are clear. Normal cardio-mediastinal silhouette. No acute osseous abnormalities. The soft tissues are within normal limits. IMPRESSION: Mild diffuse increased interstitial markings with bilateral lower lobe predominance, which may represent interstitial edema versus atypical pneumonia. Electronically Signed   By: Ree Molt M.D.   On: 06/16/2024 14:51     Procedures   Medications Ordered in the ED  lactated ringers  bolus 500 mL (0 mLs Intravenous Stopped 06/16/24 1621)  diltiazem  (CARDIZEM ) injection 20 mg (20 mg Intravenous Given 06/16/24 1542)    Clinical Course as of 06/16/24 1824  Sun Jun 16, 2024  1457 Per my chart review, Echo in 2021: IMPRESSIONS     1. Left ventricular ejection fraction, by estimation, is 60 to 65%. The  left ventricle has normal function. The left ventricle has no regional  wall motion abnormalities. Left ventricular diastolic function could not  be evaluated.   2. Right ventricular systolic function was not well visualized. The right  ventricular size is not well visualized.   3. The mitral valve is grossly normal. No evidence of mitral valve  regurgitation.   4. The aortic valve was not well visualized. Aortic valve regurgitation  is not visualized.   5. Very limited echo, however, LV function appears normal.  [TY]  1506 Patient appears to be in A-fib RVR with a rate in the 120s as interpreted by me on monitor.  Does not appear to be in acute distress.  Maintaining oxygen saturation on room air. [TY]  1525 Basic metabolic panel(!) No significant metabolic derangements, normal kidney function [TY]  1525 Troponin I (High Sensitivity): 9 [TY]  1542 Patient's heart rate still in the 120s to 130s on the monitor as interpreted by me.  Will give a dose of IV diltiazem .  Blood pressure 150s systolic [TY]  1612 D-Dimer, Quant: 0.35 PE less likely [TY]  1754 Discussed case with cardiology recommending admission for  optimization and further workup of his new A-fib and possible CHF.  Requesting medicine admission with cardiology consult. [TY]  1824 Case discussed with hospitalist for admission [TY]  1824 Patient updated on plan, agreeable to stay [TY]    Clinical Course User Index [TY] Neysa Clap PARAS, DO                                 Medical Decision Making This is a 63 year old male history of hypertension, stroke, peripheral artery disease presented to the emergency department from urgent care.  Per chart review heart rate 146 at urgent care.  EKG as  interpreted by me is A-fib RVR, but does not appear to have ischemic changes.  Patient is largely asymptomatic currently, but does note some feelings of palpitations.  His chest x-ray with some signs of edema in lower lungs versus pneumonia.  He denies fevers, cough, and has no leukocytosis.  Awaiting metabolic panel, troponin.  Given his worsening dyspnea on exertion and chest x-ray with edema we will get BNP to evaluate for heart failure although he has no documented history of same.  He also notes that he has worn a Holter monitor in the past with a stroke and did not find arrhythmia.  He is not currently taking Eliquis .  He did travel to [facility] a few months ago around the time of symptom onset, otherwise low risk for PE.  Will evaluate with D-dimer.  Will give IV fluids awaiting workup.  See course for further MDM disposition  Amount and/or Complexity of Data Reviewed Independent Historian: EMS    Details: Give 10 mg of diltiazem  External Data Reviewed:     Details: See ED course, last echo with normal EF Labs: ordered. Decision-making details documented in ED Course. Radiology: ordered and independent interpretation performed.    Details: Chest x-ray does appear to have some lower lobe edema ECG/medicine tests:     Details: See above Discussion of management or test interpretation with external provider(s): Cardiology; see ED  course  Risk Prescription drug management. Decision regarding hospitalization. Diagnosis or treatment significantly limited by social determinants of health. Risk Details: Poor health literacy      Final diagnoses:  Atrial fibrillation, unspecified type [facility])    ED Discharge Orders     None          Neysa Caron PARAS, DO 06/16/24 1824

## 2024-06-16 NOTE — Consult Note (Signed)
 Cardiology Consultation   Patient ID: Cody Baird MRN: [id number]; DOB: [birth year]  Admit date: 06/16/2024 Date of Consult: 06/16/2024  PCP:  Delbert Clam, MD   [facility] Providers Cardiologist: Dr. Monetta    Patient Profile:   Cody Baird is a 63 y.o. male with a hx of CVA, PAD, HTN, tobacco use, who was admitted for AF with RVR and suspected HF exacerbation.   Patient initially presented with 3 months of dyspnea on exertion that acutely worsened over the past 3-4 days. He does note that over the past 2 weeks, he has been dealing with sinus infection/bronchitis with cough and ongoing exertional dyspnea.  In particular he noted it was more difficult to walk up his 36ft hill up his driveway. On Thursday, when exerting himself he felt palpitations, with associated shortness of breath and chest pain.  He presented to urgent care and was found to be in AF with RVR with Hr elevated to 140-150s. He was given diltiazem  10mg  IV by EMS.   At rest, he denies any chest pain, palpitations (even while in AF with rates up to 140s), shortness of breath, orthopnea, pnd, lower extremity swelling.   ER Course:  Vitals: HR 141, RR 23, BP 128/107, Sat 97% on RA Labs: WBC 8.8, Hgb 17.2, Plt 158, Na 136, K 5.0, Cl 109, CO2 19, Glu 90, Bun 14, Cr 0.93, Ca 9.1, Trop 9, D-dimer 0.35, BNP 257.2 CXR: Mild diffuse increased interstitial markings with bilateral lower lobe predominance, which may represent interstitial edema versus atypical pneumonia. 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   IMO SNOMED Dx Update Oct 2024     Atherosclerosis of native arteries of the extremities with ulceration(440.23)  07/16/2013   Atherosclerotic peripheral vascular disease with rest pain (HCC) 10/30/2013   B12 deficiency    Bradycardia    Chronic back pain    CVA (cerebral vascular accident) (HCC) 01/28/2020   Essential hypertension 01/28/2020   History of bronchitis    last time about 2-40yrs ago   Ischemic pain of foot 12/24/2013   PAD (peripheral artery disease) (HCC)    Pain in limb 06/25/2013   PBA (pseudobulbar affect)    Peripheral vascular disease (HCC)    Peripheral vascular disease, unspecified (HCC) 06/25/2013   Post-op pain 09/17/2013   Post-operative pain 07/16/2013   Prolapsed, anus    hx of   PVD (peripheral vascular disease) (HCC) 09/17/2013   Stroke (HCC) 01/2020   TIA (transient ischemic attack) 01/28/2020   Tobacco abuse 01/28/2020   Venous stasis ulcer of ankle, right (HCC) 02/17/2020    Past Surgical History:  Procedure Laterality Date   ABDOMINAL AORTAGRAM N/A 06/26/2013   Procedure: ABDOMINAL AORTAGRAM;  Surgeon: Krystal JULIANNA Doing, MD;  Location[facility] CATH LAB;  Service: Cardiovascular;  Laterality: N/A;   AMPUTATION Right 12/04/2013   Procedure: AMPUTATION DIGIT- 3RD TOE RIGHT FOOT ;  Surgeon: Krystal JULIANNA Doing, MD;  Location: [facility] OR;  Service: Vascular;  Laterality: Right;   AMPUTATION Right 01/15/2014   Procedure: AMPUTATION OF RIGHT 4TH and 5TH TOES;  Surgeon: Krystal JULIANNA Doing, MD;  Location: [facility] OR;  Service: Vascular;  Laterality: Right;  BACK SURGERY     L1-2   BACK SURGERY     L1-2   BACK SURGERY     thinks L1- 2   COLON SURGERY     removal of colon due to proplase rectum   COLOSTOMY     COLOSTOMY CLOSURE  2006ish   ESOPHAGOGASTRODUODENOSCOPY     FEMORAL-TIBIAL BYPASS GRAFT Right 06/28/2013   Procedure: BYPASS GRAFT FEMORAL TO TIBIAL PERONEAL TRUNK ARTERY;  Surgeon: Krystal JULIANNA Doing, MD;  Location: [facility] OR;  Service: Vascular;  Laterality: Right;     Home Medications:  Prior to Admission medications   Medication Sig Start Date End Date Taking? Authorizing Provider  amLODipine   (NORVASC ) 5 MG tablet Take 1 tablet (5 mg total) by mouth daily. 02/07/24   Newlin, Enobong, MD  apixaban  (ELIQUIS ) 5 MG TABS tablet Take 5 mg by mouth 2 (two) times daily.    [provider]  aspirin  EC 81 MG tablet Take 1 tablet (81 mg total) by mouth daily. Swallow whole. 06/16/22   Monetta Redell PARAS, MD  atorvastatin  (LIPITOR) 40 MG tablet Take 1 tablet (40 mg total) by mouth daily. 02/07/24   Newlin, Enobong, MD  diphenhydrAMINE  (BENADRYL ) 25 MG tablet Take 25-50 mg by mouth every 6 (six) hours as needed for itching or allergies.    [provider]    Inpatient Medications: Scheduled Meds:  Continuous Infusions:  PRN Meds:   Allergies:    Allergies  Allergen Reactions   Penicillins Other (See Comments)    Social History:   Social History   Socioeconomic History   Marital status: Divorced    Spouse name: Not on file   Number of children: Not on file   Years of education: Not on file   Highest education level: Not on file  Occupational History   Not on file  Tobacco Use   Smoking status: Former    Current packs/day: 0.00    Average packs/day: 0.5 packs/day for 36.0 years (18.0 ttl pk-yrs)    Types: Cigarettes    Start date: 01/27/1984    Quit date: 01/27/2020    Years since quitting: 4.3   Smokeless tobacco: Never  Substance and Sexual Activity   Alcohol use: No    Alcohol/week: 0.0 standard drinks of alcohol   Drug use: No   Sexual activity: Not on file  Other Topics Concern   Not on file  Social History Narrative   Not on file   Social Drivers of Health   Financial Resource Strain: Not on file  Food Insecurity: Not on file  Transportation Needs: Not on file  Physical Activity: Not on file  Stress: Not on file  Social Connections: Not on file  Intimate Partner Violence: Not on file    Family History:    Family History  Problem Relation Age of Onset   Cancer Mother    Diabetes Mother    Diabetes Father    Hyperlipidemia Father     Hypertension Father    Heart attack Father      ROS:  Please see the history of present illness.   All other ROS reviewed and negative.     Physical Exam/Data:   Vitals:   06/16/24 1605 06/16/24 1615 06/16/24 1623 06/16/24 1700  BP: 120/88 121/85  (!) 122/92  Pulse: 87 100 (!) 133 93  Resp: 17 18  15   Temp:      SpO2: 94% 94%  95%  Weight:      Height:  No intake or output data in the 24 hours ending 06/16/24 1756    06/16/2024    2:28 PM 02/07/2024    1:58 PM 01/10/2024    9:45 AM  Last 3 Weights  Weight (lbs) 251 lb 247 lb 3.2 oz 244 lb 9.6 oz  Weight (kg) 113.853 kg 112.129 kg 110.95 kg     Body mass index is 34.04 kg/m.  General:  Well nourished, well developed, in no acute distress HEENT: normal Neck: no JVD Vascular: No carotid bruits; Distal pulses 2+ bilaterally Cardiac:  normal S1, S2; RRR; no murmur  Lungs:  clear to auscultation bilaterally, no wheezing, rhonchi or rales  Abd: soft, nontender, no hepatomegaly  Ext: no edema Skin: warm and dry  Neuro:  no focal abnormalities noted, moves all extremities spontaneously  Psych:  Normal affect   Relevant CV Studies: TTE 01/28/20  1. Left ventricular ejection fraction, by estimation, is 60 to 65%. The left ventricle has normal function. The left ventricle has no regional wall motion abnormalities. Left ventricular diastolic function could not be evaluated.   2. Right ventricular systolic function was not well visualized. The right ventricular size is not well visualized.   3. The mitral valve is grossly normal. No evidence of mitral valve regurgitation.   4. The aortic valve was not well visualized. Aortic valve regurgitation is not visualized.   5. Very limited echo, however, LV function appears normal.   Laboratory Data:  High Sensitivity Troponin:   Recent Labs  Lab 06/16/24 1431  TROPONINIHS 9     Chemistry Recent Labs  Lab 06/16/24 1431  NA 136  K 5.0  CL 109  CO2 19*  GLUCOSE 90  BUN 14   CREATININE 0.93  CALCIUM  9.1  GFRNONAA >60  ANIONGAP 8    No results for input(s): PROT, ALBUMIN, AST, ALT, ALKPHOS, BILITOT in the last 168 hours. Lipids No results for input(s): CHOL, TRIG, HDL, LABVLDL, LDLCALC, CHOLHDL in the last 168 hours.  Hematology Recent Labs  Lab 06/16/24 1431  WBC 8.8  RBC 5.78  HGB 17.2*  HCT 54.0*  MCV 93.4  MCH 29.8  MCHC 31.9  RDW 13.6  PLT 158   Thyroid No results for input(s): TSH, FREET4 in the last 168 hours.  BNP Recent Labs  Lab 06/16/24 1519  BNP 257.2*    DDimer  Recent Labs  Lab 06/16/24 1519  DDIMER 0.35     Radiology/Studies:  DG Chest Portable 1 View Result Date: 06/16/2024 CLINICAL DATA:  Shortness of breath. EXAM: PORTABLE CHEST 1 VIEW COMPARISON:  None Available. FINDINGS: Mild diffuse increased interstitial markings with bilateral lower lobe predominance. Bilateral lung fields are otherwise clear. Bilateral costophrenic angles are clear. Normal cardio-mediastinal silhouette. No acute osseous abnormalities. The soft tissues are within normal limits. IMPRESSION: Mild diffuse increased interstitial markings with bilateral lower lobe predominance, which may represent interstitial edema versus atypical pneumonia. Electronically Signed   By: Ree Molt M.D.   On: 06/16/2024 14:51     Assessment and Plan:   #suspected HF exacerbation #AF with RVR Patient presenting with acute on chronic dyspnea on exertion and was found to be in Afib. CXR demonstrated pulmonary edema; BNP 257.3 likely related to tachycarrythmia. CHA2DSVASC score is atleast 4 (HTN, CVA, Vascular disease). As patient has been in AF likely >48h, would recommend anticoagulation and rate controlling agents. Would anticipate TEE/DCCV once euvolemic --obtain formal TTE --A1c, Lipid panel, TSH --Continue IV diuresis with lasix  20mg  IV  --daily weights,  strict I/Os  --goal net negative 1.5-2L/24h --continue therapeutic  anticoagulation --continue rate control with diltiazem  IV/po --anticipate TEE/DCCV once euvolemic  --Qtc 442 on manual calculation; prior baseline Qtc 409 in NSR --could consider initiation of anti-arrhtyhmic after dccv; selection pending TTE --Estimated Creatinine Clearance: 107.3 mL/min (by C-G formula based on SCr of 0.93 mg/dL). --If EF is reduced, may consider further ischemic evaluation given known history of vascular disease  --continue telemetry --goal K~4, Mag~2  #PAD #CVA --continue asa 81mg  every day --continue atorvastatin  40mg  every day  #HTN --hold home amlodipine  5mg  every day    Risk Assessment/Risk Scores:        [place]  Heart Association (NYHA) Functional Class NYHA Class II  CHA2DS2-VASc Score = 4      For questions or updates, please contact [facility] Please consult [email or web address] for contact info under   Signed, Earlene CHRISTELLA Cluster, MD  06/16/2024 5:56 PM

## 2024-06-16 NOTE — ED Provider Notes (Signed)
 [facility]    CSN: [id number] Arrival date & time: 06/16/24  1238      History   Chief Complaint Chief Complaint  Patient presents with   Shortness of Breath    HPI TORRIE LAFAVOR is a 63 y.o. male.   Patient is a 64 year old male with past medical history of CVA, PAD, PVD currently on blood thinners.  He presents today with worsening shortness of breath over the past 3 to 4 days.  Reports that he was at the grocery store and loading up the car and felt extremely fatigued.  Denies feeling like his heart was racing or irregular.  Upon arrival here today he is found to be in A-fib RVR.  Denies any history of the same.   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   IMO SNOMED Dx Update Oct 2024     Atherosclerosis of native arteries of the extremities with ulceration(440.23) 07/16/2013   Atherosclerotic peripheral vascular disease with rest pain (HCC) 10/30/2013   B12 deficiency    Bradycardia    Chronic back pain    CVA (cerebral vascular accident) (HCC) 01/28/2020   Essential hypertension 01/28/2020   History of bronchitis    last time about 2-62yrs ago   Ischemic pain of foot 12/24/2013   PAD (peripheral artery disease) (HCC)    Pain in limb 06/25/2013   PBA (pseudobulbar affect)    Peripheral vascular disease (HCC)    Peripheral vascular disease, unspecified (HCC) 06/25/2013   Post-op pain 09/17/2013   Post-operative pain 07/16/2013   Prolapsed, anus    hx of   PVD (peripheral vascular disease) (HCC) 09/17/2013   Stroke (HCC) 01/2020   TIA (transient ischemic attack) 01/28/2020   Tobacco abuse 01/28/2020   Venous stasis ulcer of ankle, right (HCC) 02/17/2020    Patient Active Problem List    Diagnosis Date Noted   Prolapsed, anus    Peripheral vascular disease (HCC)    PAD (peripheral artery disease) (HCC)    History of bronchitis    Chronic back pain    Arthritis    Venous stasis ulcer of ankle, right (HCC) 02/17/2020   PBA (pseudobulbar affect)    Acute right MCA stroke (HCC) 01/30/2020   B12 deficiency    Bradycardia 01/29/2020   Tobacco abuse 01/28/2020   Essential hypertension 01/28/2020   TIA (transient ischemic attack) 01/28/2020   Stroke (HCC) 01/28/2020   CVA (cerebral vascular accident) (HCC) 01/28/2020   Aftercare following surgery of the circulatory system, NEC 01/14/2014   Ischemic pain of foot 12/24/2013   Atherosclerotic peripheral vascular disease with rest pain (HCC) 10/30/2013   Post-op pain 09/17/2013   PVD (peripheral vascular disease) (HCC) 09/17/2013   Post-operative pain 07/16/2013   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   Peripheral vascular disease, unspecified (HCC) 06/25/2013   Pain in limb 06/25/2013    Past Surgical History:  Procedure Laterality Date   ABDOMINAL AORTAGRAM N/A 06/26/2013   Procedure: ABDOMINAL AORTAGRAM;  Surgeon: Krystal JULIANNA Doing, MD;  Location[facility] CATH LAB;  Service: Cardiovascular;  Laterality: N/A;   AMPUTATION Right 12/04/2013   Procedure: AMPUTATION DIGIT- 3RD TOE RIGHT FOOT ;  Surgeon: Krystal JULIANNA  Early, MD;  Location: [facility] OR;  Service: Vascular;  Laterality: Right;   AMPUTATION Right 01/15/2014   Procedure: AMPUTATION OF RIGHT 4TH and 5TH TOES;  Surgeon: Krystal JULIANNA Doing, MD;  Location: [facility] OR;  Service: Vascular;  Laterality: Right;   BACK SURGERY     L1-2   BACK SURGERY     L1-2   BACK SURGERY     thinks L1- 2   COLON SURGERY     removal of colon due to proplase rectum   COLOSTOMY     COLOSTOMY CLOSURE  2006ish   ESOPHAGOGASTRODUODENOSCOPY     FEMORAL-TIBIAL BYPASS GRAFT Right 06/28/2013   Procedure: BYPASS GRAFT FEMORAL TO TIBIAL PERONEAL TRUNK ARTERY;  Surgeon: Krystal JULIANNA Doing, MD;  Location: [facility] OR;  Service: Vascular;  Laterality: Right;       Home Medications    Prior to Admission medications   Medication Sig Start Date End Date Taking? Authorizing Provider  apixaban  (ELIQUIS ) 5 MG TABS tablet Take 5 mg by mouth 2 (two) times daily.   Yes [provider]  amLODipine  (NORVASC ) 5 MG tablet Take 1 tablet (5 mg total) by mouth daily. 02/07/24   Newlin, Enobong, MD  aspirin  EC 81 MG tablet Take 1 tablet (81 mg total) by mouth daily. Swallow whole. 06/16/22   Monetta Redell PARAS, MD  atorvastatin  (LIPITOR) 40 MG tablet Take 1 tablet (40 mg total) by mouth daily. 02/07/24   Newlin, Enobong, MD  diphenhydrAMINE  (BENADRYL ) 25 MG tablet Take 25-50 mg by mouth every 6 (six) hours as needed for itching or allergies.    [provider]    Family History Family History  Problem Relation Age of Onset   Cancer Mother    Diabetes Mother    Diabetes Father    Hyperlipidemia Father    Hypertension Father    Heart attack Father     Social History Social History   Tobacco Use   Smoking status: Former    Current packs/day: 0.00    Average packs/day: 0.5 packs/day for 36.0 years (18.0 ttl pk-yrs)    Types: Cigarettes    Start date: 01/27/1984    Quit date: 01/27/2020    Years since quitting: 4.3   Smokeless tobacco: Never  Substance Use Topics   Alcohol use: No    Alcohol/week: 0.0 standard drinks of alcohol   Drug use: No     Allergies   Penicillins   Review of Systems Review of Systems  Respiratory:  Positive for shortness of breath.    See HPI  Physical Exam Triage Vital Signs ED Triage Vitals  Encounter Vitals Group     BP 06/16/24 1309 130/87     Girls Systolic BP Percentile --      Girls Diastolic BP Percentile --      Boys Systolic BP Percentile --      Boys Diastolic BP Percentile --      Pulse Rate 06/16/24 1309 (!) 146     Resp 06/16/24 1309 (!) 22     Temp 06/16/24 1309 98.3 F (36.8 C)     Temp Source 06/16/24 1309  Oral     SpO2 06/16/24 1309 95 %     Weight --      Height --      Head Circumference --      Peak Flow --      Pain Score 06/16/24 1319 0     Pain Loc --  Pain Education --      Exclude from Growth Chart --    No data found.  Updated Vital Signs BP 130/87 (BP Location: Right Arm)   Pulse (!) 146 Comment: irregular  Temp 98.3 F (36.8 C) (Oral)   Resp (!) 22   SpO2 95%   Visual Acuity Right Eye Distance:   Left Eye Distance:   Bilateral Distance:    Right Eye Near:   Left Eye Near:    Bilateral Near:     Physical Exam Constitutional:      General: He is not in acute distress.    Appearance: Normal appearance. He is well-developed. He is not ill-appearing or toxic-appearing.  Cardiovascular:     Rate and Rhythm: Rhythm irregular.     Pulses: No decreased pulses.     Heart sounds: No murmur heard.    No friction rub. No gallop.  Pulmonary:     Effort: Pulmonary effort is normal.     Breath sounds: Examination of the right-upper field reveals decreased breath sounds and wheezing. Examination of the left-upper field reveals decreased breath sounds and wheezing. Examination of the right-middle field reveals decreased breath sounds and wheezing. Examination of the left-middle field reveals decreased breath sounds and wheezing. Examination of the right-lower field reveals decreased breath sounds and wheezing. Examination of the left-lower field reveals decreased breath sounds and wheezing. Decreased breath sounds and wheezing present.  Musculoskeletal:        General: Normal range of motion.  Skin:    General: Skin is warm and dry.  Neurological:     Mental Status: He is alert.  Psychiatric:        Mood and Affect: Mood normal.      UC Treatments / Results  Labs (all labs ordered are listed, but only abnormal results are displayed) Labs Reviewed - No data to display  EKG   Radiology No results found.  Procedures Procedures (including critical care  time)  Medications Ordered in UC Medications - No data to display  Initial Impression / Assessment and Plan / UC Course  I have reviewed the triage vital signs and the nursing notes.  Pertinent labs & imaging results that were available during my care of the patient were reviewed by me and considered in my medical decision making (see chart for details).     A-fib with RVR-patient with new onset of A-fib and shortness of breath. Patient otherwise stable and blood pressure normal with normal oxygen levels. He is in no distress  EKG confirmed a fib RVR with septal and inferior infarct. EMS called for emergency transport to [facility].  IV started here.   Final Clinical Impressions(s) / UC Diagnoses   Final diagnoses:  Atrial fibrillation with rapid ventricular response [facility])     Discharge Instructions      ER per EMS      ED Prescriptions   None    PDMP not reviewed this encounter.   Adah Wilbert LABOR, FNP 06/16/24 1339

## 2024-06-16 NOTE — ED Triage Notes (Addendum)
 States thought he was fighting a sinus infection for about 3 1/2 months. Thought he was doing well a few different times but recently feeling short of breath, sinus drainage, ear pressure, right side sinus pressure. Lower jaw aches. On assessment, patient's heart rate is irregular and rapid. Rate between 80-152. Provider brought to bedside. Skin p/w/d.

## 2024-06-16 NOTE — ED Notes (Signed)
 EMS arrival. Bedside report given.

## 2024-06-16 NOTE — ED Notes (Signed)
 Patient is being discharged from the Urgent Care and sent to the Emergency Department via EMS . Per T. Bast, FNP, patient is in need of higher level of care due to new onset afib with RPR. Patient is aware and verbalizes understanding of plan of care.  Vitals:   06/16/24 1309  BP: 130/87  Pulse: (!) 146  Resp: (!) 22  Temp: 98.3 F (36.8 C)  SpO2: 95%

## 2024-06-16 NOTE — ED Notes (Signed)
 911 called for EMS transport to hospital for new onset atrial fibrillation.

## 2024-06-17 ENCOUNTER — Other Ambulatory Visit (HOSPITAL_COMMUNITY): Payer: Self-pay

## 2024-06-17 ENCOUNTER — Observation Stay (HOSPITAL_COMMUNITY): Payer: Self-pay

## 2024-06-17 DIAGNOSIS — I4891 Unspecified atrial fibrillation: Secondary | ICD-10-CM | POA: Diagnosis present

## 2024-06-17 HISTORY — DX: Unspecified atrial fibrillation: I48.91

## 2024-06-17 LAB — ECHOCARDIOGRAM COMPLETE
Area-P 1/2: 5.7 cm2
Calc EF: 53.2 %
Height: 72 in
S' Lateral: 6 cm
Single Plane A2C EF: 55.9 %
Single Plane A4C EF: 48.8 %
Weight: 4031.77 [oz_av]

## 2024-06-17 LAB — BASIC METABOLIC PANEL WITH GFR
Anion gap: 8 (ref 5–15)
BUN: 11 mg/dL (ref 8–23)
CO2: 27 mmol/L (ref 22–32)
Calcium: 9.3 mg/dL (ref 8.9–10.3)
Chloride: 105 mmol/L (ref 98–111)
Creatinine, Ser: 1.1 mg/dL (ref 0.61–1.24)
GFR, Estimated: >60 mL/min (ref >60)
Glucose, Bld: 114 mg/dL — ABNORMAL HIGH (ref 70–99)
Potassium: 4.6 mmol/L (ref 3.5–5.1)
Sodium: 140 mmol/L (ref 135–145)

## 2024-06-17 LAB — CBC
HCT: 49.7 % (ref 39.0–52.0)
Hemoglobin: 15.8 g/dL (ref 13.0–17.0)
MCH: 30 pg (ref 26.0–34.0)
MCHC: 31.8 g/dL (ref 30.0–36.0)
MCV: 94.3 fL (ref 80.0–100.0)
Platelets: 162 K/uL (ref 150–400)
RBC: 5.27 MIL/uL (ref 4.22–5.81)
RDW: 13.8 % (ref 11.5–15.5)
WBC: 8.3 K/uL (ref 4.0–10.5)
nRBC: 0 % (ref 0.0–0.2)

## 2024-06-17 LAB — MAGNESIUM: Magnesium: 2.1 mg/dL (ref 1.7–2.4)

## 2024-06-17 LAB — HIV ANTIBODY (ROUTINE TESTING W REFLEX): HIV Screen 4th Generation wRfx: NONREACTIVE

## 2024-06-17 MED ORDER — FUROSEMIDE 10 MG/ML IJ SOLN
40.0000 mg | Freq: Once | INTRAMUSCULAR | Status: AC
  Administered 2024-06-17: 40 mg via INTRAVENOUS
  Filled 2024-06-17: qty 4

## 2024-06-17 MED ORDER — METOPROLOL TARTRATE 25 MG PO TABS
25.0000 mg | ORAL_TABLET | Freq: Two times a day (BID) | ORAL | Status: DC
  Administered 2024-06-17 (×2): 25 mg via ORAL
  Filled 2024-06-17 (×2): qty 1

## 2024-06-17 MED ORDER — SODIUM CHLORIDE 0.9 % IV SOLN: INTRAVENOUS | Status: DC

## 2024-06-17 MED ORDER — DABIGATRAN ETEXILATE MESYLATE 150 MG PO CAPS
150.0000 mg | ORAL_CAPSULE | Freq: Two times a day (BID) | ORAL | Status: DC
  Administered 2024-06-17 – 2024-06-19 (×4): 150 mg via ORAL
  Filled 2024-06-17 (×5): qty 1

## 2024-06-17 MED ORDER — MELATONIN 3 MG PO TABS
3.0000 mg | ORAL_TABLET | Freq: Once | ORAL | Status: AC
  Administered 2024-06-17: 3 mg via ORAL
  Filled 2024-06-17 (×2): qty 1

## 2024-06-17 MED ORDER — PERFLUTREN LIPID MICROSPHERE
1.0000 mL | INTRAVENOUS | Status: DC | PRN
  Administered 2024-06-17: 1 mL via INTRAVENOUS

## 2024-06-17 NOTE — H&P (View-Only) (Signed)
 Progress Note  Patient Name: Cody Baird Date of Encounter: 06/17/2024 [facility] Cardiologist: None   Interval Summary   Admitted overnight for complaints of shortness of breath, noted to be in A-fib RVR.  Continues to exhibit some shortness of breath, no chest pain, denies any palpitations.  Vital Signs Vitals:   06/16/24 2327 06/17/24 0348 06/17/24 0500 06/17/24 0831  BP: (!) 131/97 112/75  (!) 129/93  Pulse: 100 92  100  Resp: 13 16  18   Temp: 97.9 F (36.6 C) 98 F (36.7 C)  98.7 F (37.1 C)  TempSrc: Oral Oral  Oral  SpO2: 90% 100%  91%  Weight:   114.3 kg   Height:        Intake/Output Summary (Last 24 hours) at 06/17/2024 0929 Last data filed at 06/17/2024 9167 Gross per 24 hour  Intake 123 ml  Output 450 ml  Net -327 ml      06/17/2024    5:00 AM 06/16/2024    8:48 PM 06/16/2024    2:28 PM  Last 3 Weights  Weight (lbs) 251 lb 15.8 oz 252 lb 4.8 oz 251 lb  Weight (kg) 114.3 kg 114.443 kg 113.853 kg      Telemetry/ECG  Atrial fibrillation heart rates around 110, however he does spike to 130s to 140s- Personally Reviewed  Physical Exam  GEN: No acute distress.   Neck: No JVD Cardiac: IRRR, no murmurs, rubs, or gallops.  Distant heart sounds Respiratory: Clear to auscultation bilaterally. GI: Soft, nontender, non-distended  MS: No edema  Patient Profile Patient with past medical history significant for MCA stroke 01/2020, PAD status post RLE bypass, hypertension, hyperlipidemia, former tobacco use.  Admitted for atrial fibrillation  Assessment & Plan   New onset A-fib RVR Presented with worsening complaints of shortness of breath over the last 2 weeks, also with recent sinus infection/bronchitis.  Started on IV diltiazem  currently at 7.5.  Heart rates around 110 although he does jump up easily into the 140s.  Heart rate goal should be less than 110, titrate accordingly. Continue Eliquis  5 mg twice daily, IV diltiazem .  Will try to transition to  p.o. once we find steady state. Still reporting shortness of breath, this admission may need to plan for TEE/DCCV.  He would be amenable.  Will need at least 3 doses of Eliquis  before considering. TSH normal Suspect he has OSA, had hypoxia reported during the nighttime.  Needs outpatient sleep study. Echocardiogram is pending, overall appears to be euvolemic.  If EF is depressed we will need to stop diltiazem . Would considering getting respiratory panel.  History of CVA PAD Hyperlipidemia Of note prior heart monitor 02/2020 with no evidence of atrial fibrillation On aspirin , will ensure this is okay with DOAC with MD. Continue atorvastatin  40 mg.  LDL is 55.  Goal should be less than 70.  Hypertension Holding amlodipine  given her improved rate control  Former smoker He is a bit wheezy on exam, reportedly heavy smoker but completely quit in 2021 after his stroke.  May benefit from pulmonary function test outpatient, also a more cardioselective beta-blocker if needed like bisoprolol if we consider that in the future  For questions or updates, please contact [facility] Please consult [email or web address] for contact info under       Signed, Thom LITTIE Sluder, PA-C     I have seen and examined the patient along with Thom LITTIE Sluder, PA-C  .  I have reviewed the chart, notes and  new data.  I agree with PA/NP's note.  Key new complaints: unaware of palpitations, less dyspneic Key examination changes: irregular rhythm approx 100 bpm, otw normal CV exam Key new findings / data: echo at bedside shows mild global LV hypokinesis, EF 40-45%, normal RV and valves, dilated LA.  PLAN: Started DOAC. Continue IV diltiazem  for now, but rate control will be challenging it appears. Scheduled for TEE and DCCV tomorrow at 0915 h. Outpatient sleep study.  Informed Consent   Shared Decision Making/Informed Consent   The risks [stroke, cardiac arrhythmias rarely resulting in the need for a temporary or  permanent pacemaker, skin irritation or burns, esophageal damage, perforation (1:10,000 risk), bleeding, pharyngeal hematoma as well as other potential complications associated with conscious sedation including aspiration, arrhythmia, respiratory failure and death], benefits (treatment guidance, restoration of normal sinus rhythm, diagnostic support) and alternatives of a transesophageal echocardiogram guided cardioversion were discussed in detail with Mr. Copes and he is willing to proceed.      Lanee Chain, MD, FACC [facility] [phone] 06/17/2024, 11:57 AM

## 2024-06-17 NOTE — Progress Notes (Signed)
   06/17/24 1441  Spiritual Encounters  Type of Visit Attempt (pt unavailable)   Chaplain stopped by to follow up with Pt regarding the Advance Directive (AD). Pt was asleep at the time of visit. No attempt was made to wake the Pt. Chaplain will return at a later time to review and discuss the AD when the pt is awake and alert.

## 2024-06-17 NOTE — Progress Notes (Signed)
 Mobility Specialist Progress Note:   06/17/24 0945  Mobility  Activity Dangled on edge of bed  Level of Assistance Standby assist, set-up cues, supervision of patient - no hands on  Distance Ambulated (ft)  (deferred d/t high HR)  Activity Response Tolerated well  Mobility visit 1 Mobility  Mobility Specialist Start Time (ACUTE ONLY) 0945  Mobility Specialist Stop Time (ACUTE ONLY) 0955  Mobility Specialist Time Calculation (min) (ACUTE ONLY) 10 min   Pt agreeable to mobility session. Session limited by high HR. Upon sitting in bed, HR up to 140 Afib RVR. Pt also experiencing SOB. OOB mobility deferred at this time. Left with all needs met.  Therisa Rana Mobility Specialist Please contact via SecureChat or  Rehab office at [phone]

## 2024-06-17 NOTE — Progress Notes (Signed)
   Cody Baird  FMW:[id number] DOB: 1961/09/23 DOA: 06/16/2024 PCP: Delbert Clam, MD    Brief Narrative:  63 year old with a history of right MCA CVA 2021, PAD status post right lower extremity bypass, HTN, HLD, and remote tobacco abuse who presented to the ER with palpitations and dyspnea on exertion becoming more frequent and severe over the last 3 months.  He initially presented to an urgent care where he was noted to be in atrial fibrillation with a heart rate of 140 and was then transported to the [place] ER.  Goals of Care:   Code Status: Full Code   DVT prophylaxis:  apixaban  (ELIQUIS ) tablet 5 mg   Interim Hx: Afebrile since admission.  Heart rate improved overall with diltiazem  drip.  Blood pressure stable.  Oxygen saturations marginal at 91% room air.  Feeling better overall, but still remains somewhat dyspneic.  No chest pain.  No nausea or vomiting.  Assessment & Plan:  Newly diagnosed atrial fibrillation with RVR CHA2DS2-VASc is 4 -Eliquis  initiated -cardiology following -requiring IV diltiazem  for rate control -TSH is normal -magnesium is normal -planning for TEE DCCV tomorrow  Pulmonary edema -dyspnea Likely due to persistent tachycardia/atrial fibrillation -follow-up echo results -rate control -gently diurese  Right MCA CVA 2021 No residual deficit -continue aspirin  and statin  HTN Blood pressure controlled at present  HLD Continue atorvastatin   PAD Continue aspirin  and statin  Heavy tobacco abuse The patient quit smoking at the time of his stroke in 2021  Obesity class II - Body mass index is 34.18 kg/m.   Family Communication: No family present at time of exam Disposition: Anticipate discharge home once medically stable   Objective: Blood pressure (!) 129/93, pulse 100, temperature 98.7 F (37.1 C), temperature source Oral, resp. rate 18, height 6' (1.829 m), weight 114.3 kg, SpO2 91%.  Intake/Output Summary (Last 24 hours) at 06/17/2024  0945 Last data filed at 06/17/2024 9167 Gross per 24 hour  Intake 123 ml  Output 450 ml  Net -327 ml   Filed Weights   06/16/24 1428 06/16/24 2048 06/17/24 0500  Weight: 113.9 kg 114.4 kg 114.3 kg    Examination: General: No acute respiratory distress Lungs: Clear to auscultation bilaterally without wheezes or crackles Cardiovascular: Irregularly irregular without appreciable murmur or rub Abdomen: Nontender, nondistended, soft, bowel sounds positive, no rebound, no ascites, no appreciable mass Extremities: 1+ edema bilateral lower extremities  CBC: Recent Labs  Lab 06/16/24 1431 06/17/24 0508  WBC 8.8 8.3  HGB 17.2* 15.8  HCT 54.0* 49.7  MCV 93.4 94.3  PLT 158 162   Basic Metabolic Panel: Recent Labs  Lab 06/16/24 1431 06/17/24 0508  NA 136 140  K 5.0 4.6  CL 109 105  CO2 19* 27  GLUCOSE 90 114*  BUN 14 11  CREATININE 0.93 1.10  CALCIUM  9.1 9.3  MG 2.2 2.1   GFR: Estimated Creatinine Clearance: 90.9 mL/min (by C-G formula based on SCr of 1.1 mg/dL).   Scheduled Meds:  apixaban   5 mg Oral BID   aspirin  EC  81 mg Oral Daily   atorvastatin   40 mg Oral Daily   sodium chloride  flush  3 mL Intravenous Q12H   Continuous Infusions:  diltiazem  (CARDIZEM ) infusion 7.5 mg/hr (06/16/24 2330)     LOS: 0 days   Reyes IVAR Moores, MD [facility] Office  [phone] Pager - Text Page per Tracey  If 7PM-7AM, please contact night-coverage per Amion 06/17/2024, 9:45 AM

## 2024-06-17 NOTE — Plan of Care (Signed)
  Problem: Education: Goal: Knowledge of General Education information will improve Description: Including pain rating scale, medication(s)/side effects and non-pharmacologic comfort measures Outcome: Progressing   Problem: Health Behavior/Discharge Planning: Goal: Ability to manage health-related needs will improve Outcome: Progressing   Problem: Clinical Measurements: Goal: Ability to maintain clinical measurements within normal limits will improve Outcome: Progressing Goal: Respiratory complications will improve Outcome: Progressing   Problem: Coping: Goal: Level of anxiety will decrease Outcome: Progressing   Problem: Pain Managment: Goal: General experience of comfort will improve and/or be controlled Outcome: Progressing   Problem: Safety: Goal: Ability to remain free from injury will improve Outcome: Progressing   Problem: Activity: Goal: Ability to tolerate increased activity will improve Outcome: Not Met (add Reason)

## 2024-06-17 NOTE — Discharge Instructions (Signed)
 Information on my medicine - Pradaxa  (dabigatran )  Why was Pradaxa  prescribed for you? Pradaxa  was prescribed for you to reduce the risk of forming blood clots that cause a stroke if you have a medical condition called atrial fibrillation (a type of irregular heartbeat).    What do you Need to know about PradAXa ? Take your Pradaxa  TWICE DAILY - one capsule in the morning and one tablet in the evening with or without food.  It would be best to take the doses about the same time each day.  The capsules should not be broken, chewed or opened - they must be swallowed whole.  Do not store Pradaxa  in other medication containers - once the bottle is opened the Pradaxa  should be used within FOUR months; throw away any capsules that haven't been by that time.  Take Pradaxa  exactly as prescribed by your doctor.  DO NOT stop taking Pradaxa  without talking to the doctor who prescribed the medication.  Stopping without other stroke prevention medication to take the place of Pradaxa  may increase your risk of developing a clot that causes a stroke.  Refill your prescription before you run out.  After discharge, you should have regular check-up appointments with your healthcare provider that is prescribing your Pradaxa .  In the future your dose may need to be changed if your kidney function or weight changes by a significant amount.  What do you do if you miss a dose? If you miss a dose, take it as soon as you remember on the same day.  If your next dose is less than 6 hours away, skip the missed dose.  Do not take two doses of PRADAXA  at the same time.  Important Safety Information A possible side effect of Pradaxa  is bleeding. You should call your healthcare provider right away if you experience any of the following: Bleeding from an injury or your nose that does not stop. Unusual colored urine (red or dark brown) or unusual colored stools (red or black). Unusual bruising for unknown reasons. A  serious fall or if you hit your head (even if there is no bleeding).  Some medicines may interact with Pradaxa  and might increase your risk of bleeding or clotting while on Pradaxa . To help avoid this, consult your healthcare provider or pharmacist prior to using any new prescription or non-prescription medications, including herbals, vitamins, non-steroidal anti-inflammatory drugs (NSAIDs) and supplements.  This website has more information on Pradaxa  (dabigatran ): [email or web address] .[email or web address]  Dabigatran  coupon

## 2024-06-17 NOTE — Progress Notes (Addendum)
 Progress Note  Patient Name: Cody Baird Date of Encounter: 06/17/2024 [facility] Cardiologist: None   Interval Summary   Admitted overnight for complaints of shortness of breath, noted to be in A-fib RVR.  Continues to exhibit some shortness of breath, no chest pain, denies any palpitations.  Vital Signs Vitals:   06/16/24 2327 06/17/24 0348 06/17/24 0500 06/17/24 0831  BP: (!) 131/97 112/75  (!) 129/93  Pulse: 100 92  100  Resp: 13 16  18   Temp: 97.9 F (36.6 C) 98 F (36.7 C)  98.7 F (37.1 C)  TempSrc: Oral Oral  Oral  SpO2: 90% 100%  91%  Weight:   114.3 kg   Height:        Intake/Output Summary (Last 24 hours) at 06/17/2024 0929 Last data filed at 06/17/2024 9167 Gross per 24 hour  Intake 123 ml  Output 450 ml  Net -327 ml      06/17/2024    5:00 AM 06/16/2024    8:48 PM 06/16/2024    2:28 PM  Last 3 Weights  Weight (lbs) 251 lb 15.8 oz 252 lb 4.8 oz 251 lb  Weight (kg) 114.3 kg 114.443 kg 113.853 kg      Telemetry/ECG  Atrial fibrillation heart rates around 110, however he does spike to 130s to 140s- Personally Reviewed  Physical Exam  GEN: No acute distress.   Neck: No JVD Cardiac: IRRR, no murmurs, rubs, or gallops.  Distant heart sounds Respiratory: Clear to auscultation bilaterally. GI: Soft, nontender, non-distended  MS: No edema  Patient Profile Patient with past medical history significant for MCA stroke 01/2020, PAD status post RLE bypass, hypertension, hyperlipidemia, former tobacco use.  Admitted for atrial fibrillation  Assessment & Plan   New onset A-fib RVR Presented with worsening complaints of shortness of breath over the last 2 weeks, also with recent sinus infection/bronchitis.  Started on IV diltiazem  currently at 7.5.  Heart rates around 110 although he does jump up easily into the 140s.  Heart rate goal should be less than 110, titrate accordingly. Continue Eliquis  5 mg twice daily, IV diltiazem .  Will try to transition to  p.o. once we find steady state. Still reporting shortness of breath, this admission may need to plan for TEE/DCCV.  He would be amenable.  Will need at least 3 doses of Eliquis  before considering. TSH normal Suspect he has OSA, had hypoxia reported during the nighttime.  Needs outpatient sleep study. Echocardiogram is pending, overall appears to be euvolemic.  If EF is depressed we will need to stop diltiazem . Would considering getting respiratory panel.  History of CVA PAD Hyperlipidemia Of note prior heart monitor 02/2020 with no evidence of atrial fibrillation On aspirin , will ensure this is okay with DOAC with MD. Continue atorvastatin  40 mg.  LDL is 55.  Goal should be less than 70.  Hypertension Holding amlodipine  given her improved rate control  Former smoker He is a bit wheezy on exam, reportedly heavy smoker but completely quit in 2021 after his stroke.  May benefit from pulmonary function test outpatient, also a more cardioselective beta-blocker if needed like bisoprolol if we consider that in the future  For questions or updates, please contact [facility] Please consult [email or web address] for contact info under       Signed, Thom LITTIE Sluder, PA-C     I have seen and examined the patient along with Thom LITTIE Sluder, PA-C  .  I have reviewed the chart, notes and  new data.  I agree with PA/NP's note.  Key new complaints: unaware of palpitations, less dyspneic Key examination changes: irregular rhythm approx 100 bpm, otw normal CV exam Key new findings / data: echo at bedside shows mild global LV hypokinesis, EF 40-45%, normal RV and valves, dilated LA.  PLAN: Started DOAC. Continue IV diltiazem  for now, but rate control will be challenging it appears. Scheduled for TEE and DCCV tomorrow at 0915 h. Outpatient sleep study.  Informed Consent   Shared Decision Making/Informed Consent   The risks [stroke, cardiac arrhythmias rarely resulting in the need for a temporary or  permanent pacemaker, skin irritation or burns, esophageal damage, perforation (1:10,000 risk), bleeding, pharyngeal hematoma as well as other potential complications associated with conscious sedation including aspiration, arrhythmia, respiratory failure and death], benefits (treatment guidance, restoration of normal sinus rhythm, diagnostic support) and alternatives of a transesophageal echocardiogram guided cardioversion were discussed in detail with Mr. Copes and he is willing to proceed.      Lanee Chain, MD, FACC [facility] [phone] 06/17/2024, 11:57 AM

## 2024-06-17 NOTE — TOC Initial Note (Signed)
 Transition of Care [facility]) - Initial/Assessment Note    Patient Details  Name: Cody Baird MRN: [id number] Date of Birth: [birth year]  [facility] CM/SW Contact:    Sudie Erminio Deems, RN Phone Number: 06/17/2024, 2:12 PM  Clinical Narrative:  Patient presented for dyspnea on exertion, and atrial fib. PTA patient states he was from home alone in [place]. Patient states he does not work; however, owns some duplexes in [place]. Patient is without insurance at this time. Patient has a PCP- Dr. Newlin and states he gets to appointments without any issues. Pharmacists is working with the patient regarding Eliquis  expense- changing to dabigatran  and the plan is for [facility] to fill and Pharmacist will provide the patient with a good rx coupon as well. No further need identified at this time.               Expected Discharge Plan: Home/Self Care Barriers to Discharge: Continued Medical Work up   Patient Goals and CMS Choice Patient states their goals for this hospitalization and ongoing recovery are:: plans to return home   Choice offered to / list presented to : NA      Expected Discharge Plan and Services   Discharge Planning Services: CM Consult Post Acute Care Choice: NA Living arrangements for the past 2 months: Single Family Home                   DME Agency: NA                  Prior Living Arrangements/Services Living arrangements for the past 2 months: Single Family Home Lives with:: Self Patient language and need for interpreter reviewed:: Yes Do you feel safe going back to the place where you live?: Yes      Need for Family Participation in Patient Care: No (Comment) Care giver support system in place?: No (comment)   Criminal Activity/Legal Involvement Pertinent to Current Situation/Hospitalization: No - Comment as needed  Activities of Daily Living   ADL Screening (condition at time of admission) Independently performs ADLs?: Yes  (appropriate for developmental age) Is the patient deaf or have difficulty hearing?: No Does the patient have difficulty seeing, even when wearing glasses/contacts?: No Does the patient have difficulty concentrating, remembering, or making decisions?: No  Permission Sought/Granted Permission sought to share information with : Case Manager                Emotional Assessment Appearance:: Appears stated age Attitude/Demeanor/Rapport: Engaged Affect (typically observed): Appropriate Orientation: : Oriented to Self, Oriented to Place, Oriented to  Time, Oriented to Situation Alcohol / Substance Use: Not Applicable Psych Involvement: No (comment)  Admission diagnosis:  Atrial fibrillation with RVR (HCC) [I48.91] Atrial fibrillation, unspecified type (HCC) [I48.91] Patient Active Problem List   Diagnosis Date Noted   History of stroke 06/16/2024   Hyperlipidemia 06/16/2024   Atrial fibrillation with RVR (HCC) 06/16/2024   Prolapsed, anus    Peripheral vascular disease (HCC)    PAD (peripheral artery disease) (HCC)    History of bronchitis    Chronic back pain    Arthritis    Venous stasis ulcer of ankle, right (HCC) 02/17/2020   PBA (pseudobulbar affect)    Acute right MCA stroke (HCC) 01/30/2020   B12 deficiency    Bradycardia 01/29/2020   Tobacco abuse 01/28/2020   Essential hypertension 01/28/2020   TIA (transient ischemic attack) 01/28/2020   Stroke (HCC) 01/28/2020   CVA (cerebral vascular accident) (  HCC) 01/28/2020   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 [place] [address] Phone: [phone] Fax: [phone]  [facility] [address] [place] [address] Phone: [phone] Fax: [phone]  [id number] [place], [place] - [address] AT [facility] [address] [place] [place] [address] Phone: [phone] Fax: [phone]  [place] [facility] [address], [address] [place] [address] Phone: [phone] Fax: [phone]     Social Drivers of Health (SDOH) Social History: SDOH Screenings   Food Insecurity: No Food Insecurity (06/16/2024)  Housing: Unknown (06/16/2024)  Transportation Needs: No Transportation Needs (06/16/2024)  Utilities: Not At Risk (06/16/2024)  Depression (PHQ2-9): Low Risk  (02/07/2024)  Recent Concern: Depression (PHQ2-9) - Medium Risk (12/13/2023)  Social Connections: Socially Isolated (06/16/2024)  Tobacco Use: Medium Risk (06/16/2024)   SDOH Interventions:     Readmission Risk Interventions     No data to display

## 2024-06-17 NOTE — Progress Notes (Signed)
   06/17/24 1208  Assess: MEWS Score  Temp 98.1 F (36.7 C)  BP 130/89  MAP (mmHg) 101  Pulse Rate (!) 105  ECG Heart Rate (!) 112  Resp 18  Level of Consciousness Alert  SpO2 94 %  O2 Device Room Air  Assess: MEWS Score  MEWS Temp 0  MEWS Systolic 0  MEWS Pulse 2  MEWS RR 0  MEWS LOC 0  MEWS Score 2  MEWS Score Color Yellow  Assess: if the MEWS score is Yellow or Red  Were vital signs accurate and taken at a resting state? Yes  Does the patient meet 2 or more of the SIRS criteria? No  MEWS guidelines implemented  Yes, yellow  Treat  MEWS Interventions Considered administering scheduled or prn medications/treatments as ordered  Take Vital Signs  Increase Vital Sign Frequency  Yellow: Q2hr x1, continue Q4hrs until patient remains green for 12hrs  Escalate  MEWS: Escalate Yellow: Discuss with charge nurse and consider notifying provider and/or RRT  Notify: Charge Nurse/RN  Name of Charge Nurse/RN Notified Yoko, RN  Provider Notification  Provider Name/Title Thom haley/PA-C  Date Provider Notified 06/17/24  Time Provider Notified 1219  Method of Notification  (Secure chat)  Notification Reason Other (Comment) (MEWS yellow)  Provider response See new orders  Date of Provider Response 06/17/24  Time of Provider Response 1220  Assess: SIRS CRITERIA  SIRS Temperature  0  SIRS Respirations  0  SIRS Pulse 1  SIRS WBC 0  SIRS Score Sum  1

## 2024-06-18 ENCOUNTER — Inpatient Hospital Stay (HOSPITAL_COMMUNITY): Payer: Self-pay | Admitting: Anesthesiology

## 2024-06-18 ENCOUNTER — Encounter (HOSPITAL_COMMUNITY): Payer: Self-pay | Admitting: Cardiology

## 2024-06-18 ENCOUNTER — Inpatient Hospital Stay (HOSPITAL_COMMUNITY): Payer: Self-pay

## 2024-06-18 ENCOUNTER — Encounter (HOSPITAL_COMMUNITY): Admission: EM | Disposition: A | Discharge status: Home / Self Care | Payer: Self-pay | Attending: Internal Medicine

## 2024-06-18 DIAGNOSIS — I3139 Other pericardial effusion (noninflammatory): Secondary | ICD-10-CM

## 2024-06-18 DIAGNOSIS — I4891 Unspecified atrial fibrillation: Secondary | ICD-10-CM

## 2024-06-18 DIAGNOSIS — I340 Nonrheumatic mitral (valve) insufficiency: Secondary | ICD-10-CM

## 2024-06-18 DIAGNOSIS — I679 Cerebrovascular disease, unspecified: Secondary | ICD-10-CM

## 2024-06-18 DIAGNOSIS — I110 Hypertensive heart disease with heart failure: Secondary | ICD-10-CM

## 2024-06-18 DIAGNOSIS — Z87891 Personal history of nicotine dependence: Secondary | ICD-10-CM

## 2024-06-18 DIAGNOSIS — I5020 Unspecified systolic (congestive) heart failure: Secondary | ICD-10-CM

## 2024-06-18 DIAGNOSIS — I10 Essential (primary) hypertension: Secondary | ICD-10-CM

## 2024-06-18 HISTORY — PX: TRANSESOPHAGEAL ECHOCARDIOGRAM (CATH LAB): EP1270

## 2024-06-18 HISTORY — PX: CARDIOVERSION: EP1203

## 2024-06-18 LAB — BASIC METABOLIC PANEL WITH GFR
Anion gap: 11 (ref 5–15)
BUN: 15 mg/dL (ref 8–23)
CO2: 21 mmol/L — ABNORMAL LOW (ref 22–32)
Calcium: 9.1 mg/dL (ref 8.9–10.3)
Chloride: 105 mmol/L (ref 98–111)
Creatinine, Ser: 0.89 mg/dL (ref 0.61–1.24)
GFR, Estimated: >60 mL/min (ref >60)
Glucose, Bld: 106 mg/dL — ABNORMAL HIGH (ref 70–99)
Potassium: 3.5 mmol/L (ref 3.5–5.1)
Sodium: 137 mmol/L (ref 135–145)

## 2024-06-18 LAB — ECHO TEE

## 2024-06-18 LAB — MAGNESIUM: Magnesium: 2.1 mg/dL (ref 1.7–2.4)

## 2024-06-18 SURGERY — TRANSESOPHAGEAL ECHOCARDIOGRAM (TEE) (CATHLAB)
Anesthesia: Monitor Anesthesia Care

## 2024-06-18 MED ORDER — IPRATROPIUM-ALBUTEROL 0.5-2.5 (3) MG/3ML IN SOLN
RESPIRATORY_TRACT | Status: AC
  Filled 2024-06-18: qty 3

## 2024-06-18 MED ORDER — PROPOFOL 10 MG/ML IV BOLUS
INTRAVENOUS | Status: DC | PRN
  Administered 2024-06-18: 40 mg via INTRAVENOUS

## 2024-06-18 MED ORDER — ACETAMINOPHEN 650 MG RE SUPP: 650.0000 mg | Freq: Once | RECTAL | Status: DC

## 2024-06-18 MED ORDER — LOSARTAN POTASSIUM 25 MG PO TABS
25.0000 mg | ORAL_TABLET | Freq: Every day | ORAL | Status: DC
  Administered 2024-06-18 – 2024-06-19 (×2): 25 mg via ORAL
  Filled 2024-06-18 (×2): qty 1

## 2024-06-18 MED ORDER — METOPROLOL TARTRATE 100 MG PO TABS
100.0000 mg | ORAL_TABLET | Freq: Two times a day (BID) | ORAL | Status: DC
  Administered 2024-06-18 – 2024-06-19 (×2): 100 mg via ORAL
  Filled 2024-06-18 (×2): qty 1

## 2024-06-18 MED ORDER — OFF THE BEAT BOOK
Freq: Once | Status: AC
  Filled 2024-06-18: qty 1

## 2024-06-18 MED ORDER — POTASSIUM CHLORIDE CRYS ER 20 MEQ PO TBCR
40.0000 meq | EXTENDED_RELEASE_TABLET | Freq: Two times a day (BID) | ORAL | Status: AC
  Administered 2024-06-18 (×2): 40 meq via ORAL
  Filled 2024-06-18 (×2): qty 2

## 2024-06-18 MED ORDER — PROPOFOL 500 MG/50ML IV EMUL
INTRAVENOUS | Status: DC | PRN
  Administered 2024-06-18: 100 ug/kg/min via INTRAVENOUS

## 2024-06-18 MED ORDER — FUROSEMIDE 20 MG PO TABS
20.0000 mg | ORAL_TABLET | Freq: Every day | ORAL | Status: DC
  Administered 2024-06-18 – 2024-06-19 (×2): 20 mg via ORAL
  Filled 2024-06-18 (×2): qty 1

## 2024-06-18 MED ORDER — LIDOCAINE 2% (20 MG/ML) 5 ML SYRINGE
INTRAMUSCULAR | Status: DC | PRN
  Administered 2024-06-18: 100 mg via INTRAVENOUS

## 2024-06-18 MED ORDER — AMIODARONE LOAD VIA INFUSION
150.0000 mg | Freq: Once | INTRAVENOUS | Status: AC
  Administered 2024-06-18: 150 mg via INTRAVENOUS
  Filled 2024-06-18: qty 83.34

## 2024-06-18 MED ORDER — AMIODARONE HCL IN DEXTROSE 360-4.14 MG/200ML-% IV SOLN
30.0000 mg/h | INTRAVENOUS | Status: AC
  Administered 2024-06-18 – 2024-06-19 (×2): 30 mg/h via INTRAVENOUS
  Filled 2024-06-18: qty 200

## 2024-06-18 MED ORDER — SPIRONOLACTONE 25 MG PO TABS
25.0000 mg | ORAL_TABLET | Freq: Every day | ORAL | Status: DC
  Administered 2024-06-18 – 2024-06-19 (×2): 25 mg via ORAL
  Filled 2024-06-18 (×2): qty 1

## 2024-06-18 MED ORDER — AMIODARONE HCL IN DEXTROSE 360-4.14 MG/200ML-% IV SOLN
60.0000 mg/h | INTRAVENOUS | Status: AC
  Administered 2024-06-18: 60 mg/h via INTRAVENOUS
  Filled 2024-06-18 (×2): qty 200

## 2024-06-18 MED ORDER — METOPROLOL TARTRATE 50 MG PO TABS
50.0000 mg | ORAL_TABLET | Freq: Two times a day (BID) | ORAL | Status: DC
Start: 2024-06-18 — End: 2024-06-18
  Administered 2024-06-18: 50 mg via ORAL
  Filled 2024-06-18: qty 1

## 2024-06-18 MED ORDER — METOPROLOL TARTRATE 25 MG PO TABS: 25.0000 mg | ORAL_TABLET | Freq: Once | ORAL | Status: DC

## 2024-06-18 SURGICAL SUPPLY — 1 items: PAD DEFIB RADIO PHYSIO CONN (PAD) ×1 IMPLANT

## 2024-06-18 NOTE — Plan of Care (Signed)
  Problem: Education: Goal: Knowledge of General Education information will improve Description: Including pain rating scale, medication(s)/side effects and non-pharmacologic comfort measures Outcome: Progressing   Problem: Health Behavior/Discharge Planning: Goal: Ability to manage health-related needs will improve Outcome: Progressing   Problem: Clinical Measurements: Goal: Diagnostic test results will improve Outcome: Progressing   Problem: Activity: Goal: Risk for activity intolerance will decrease Outcome: Progressing   Problem: Nutrition: Goal: Adequate nutrition will be maintained Outcome: Progressing   Problem: Pain Managment: Goal: General experience of comfort will improve and/or be controlled Outcome: Progressing   Problem: Safety: Goal: Ability to remain free from injury will improve Outcome: Progressing   Problem: Clinical Measurements: Goal: Cardiovascular complication will be avoided Outcome: Not Met (add Reason)   Problem: Coping: Goal: Level of anxiety will decrease Outcome: Not Met (add Reason)

## 2024-06-18 NOTE — Consult Note (Addendum)
 ELECTROPHYSIOLOGY CONSULT NOTE    Patient ID: Cody Baird MRN: [id number], DOB/AGE: 14-Aug-1961 63 y.o.  Admit date: 06/16/2024 Date of Consult: 06/18/2024  Primary Physician: Delbert Clam, MD Primary Cardiologist: None  Electrophysiologist: New to Dr. Cindie  Referring Provider: Dr. Francyne  Patient Profile: Cody Baird is a 63 y.o. male with a history of CVA, PAD, HTN, tobacco abuse (distant), and AF with RVR who is being seen today for the evaluation of AF failed [facility] at the request of Dr. Francyne.  HPI:  Cody Baird is a 63 y.o. male with medical history as above.   Patient initially presented with 3 months of dyspnea on exertion that acutely worsened over the past 3-4 days. He does note that over the past 2 weeks, he has been dealing with sinus infection/bronchitis with cough and ongoing exertional dyspnea.  In particular he noted it was more difficult to walk up his 107ft hill up his driveway. On Thursday, when exerting himself he felt palpitations, with associated shortness of breath and chest pain.  He presented to urgent care and was found to be in AF with RVR with Hr elevated to 140-150s. He was given diltiazem  10mg  IV by EMS.   Transferred to [facility] for further evaluation. Started on pradaxa  due to lack of insurance and unable to afford Eliquis  or Xarelto.   Echo showed EF newly down to 40-45% likely in the setting of AF.   Underwent TEE/DCC this am but failed to convert despite 3 shocks @ 200/360/360. EP asked to see for AAD recommendations.   Pt feeling better with diuresis and meds. HRs better controlled. Long discussion regarding risks and benefits for Tikosyn and Amiodarone , including need for further hospitalization, additional cardioversions, and risk of organ dysfunction.  Also discussed the relative costs and follow up for each.     Pt would prefer to leave the hospital as soon as possible, and would prefer less frequent follow up and lower cost than what Tikosyn  allows. He verbalizes understanding of long term risk of organ dysfunction on Amiodarone , but still prefers it.   Labs Potassium3.5 (08/05 9391) Magnesium  2.1 (08/05 9391) Creatinine, ser  0.89 (08/05 9391) PLT  162 (08/04 0508) HGB  15.8 (08/04 0508) WBC 8.3 (08/04 0508) Troponin I (High Sensitivity)9 (08/03 1431).    Past Medical History:  Diagnosis Date   Acute right MCA stroke (HCC) 01/30/2020   Aftercare following surgery of the circulatory system, NEC 01/14/2014   Arthritis    low back and left hip   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   IMO SNOMED Dx Update Oct 2024     Atherosclerosis of native arteries of the extremities with ulceration(440.23) 07/16/2013   Atherosclerotic peripheral vascular disease with rest pain (HCC) 10/30/2013   B12 deficiency    Bradycardia    Chronic back pain    CVA (cerebral vascular accident) (HCC) 01/28/2020   Essential hypertension 01/28/2020   History of bronchitis    last time about 2-23yrs ago   Ischemic pain of foot 12/24/2013   PAD (peripheral artery disease) (HCC)    Pain in limb 06/25/2013   PBA (pseudobulbar affect)    Peripheral vascular disease (HCC)    Peripheral vascular disease, unspecified (HCC) 06/25/2013   Post-op pain 09/17/2013   Post-operative pain 07/16/2013   Prolapsed, anus    hx of   PVD (peripheral vascular disease) (HCC) 09/17/2013   Stroke (HCC) 01/2020   TIA (  transient ischemic attack) 01/28/2020   Tobacco abuse 01/28/2020   Venous stasis ulcer of ankle, right (HCC) 02/17/2020     Surgical History:  Past Surgical History:  Procedure Laterality Date   ABDOMINAL AORTAGRAM N/A 06/26/2013   Procedure: ABDOMINAL AORTAGRAM;  Surgeon: Krystal JULIANNA Doing, MD;  Location: [facility] CATH LAB;  Service: Cardiovascular;  Laterality: N/A;   AMPUTATION Right 12/04/2013   Procedure: AMPUTATION DIGIT- 3RD TOE RIGHT FOOT ;  Surgeon: Krystal JULIANNA Doing, MD;  Location: [facility] OR;  Service:  Vascular;  Laterality: Right;   AMPUTATION Right 01/15/2014   Procedure: AMPUTATION OF RIGHT 4TH and 5TH TOES;  Surgeon: Krystal JULIANNA Doing, MD;  Location: [facility] OR;  Service: Vascular;  Laterality: Right;   BACK SURGERY     L1-2   BACK SURGERY     L1-2   BACK SURGERY     thinks L1- 2   COLON SURGERY     removal of colon due to proplase rectum   COLOSTOMY     COLOSTOMY CLOSURE  2006ish   ESOPHAGOGASTRODUODENOSCOPY     FEMORAL-TIBIAL BYPASS GRAFT Right 06/28/2013   Procedure: BYPASS GRAFT FEMORAL TO TIBIAL PERONEAL TRUNK ARTERY;  Surgeon: Krystal JULIANNA Doing, MD;  Location: [facility] OR;  Service: Vascular;  Laterality: Right;     Medications Prior to Admission  Medication Sig Dispense Refill Last Dose/Taking   amLODipine  (NORVASC ) 5 MG tablet Take 1 tablet (5 mg total) by mouth daily. 90 tablet 1 06/16/2024   aspirin  EC 81 MG tablet Take 1 tablet (81 mg total) by mouth daily. Swallow whole. 90 tablet 3 06/16/2024   atorvastatin  (LIPITOR) 40 MG tablet Take 1 tablet (40 mg total) by mouth daily. 90 tablet 1 Past Week   diphenhydrAMINE  (BENADRYL ) 25 MG tablet Take 25-50 mg by mouth every 6 (six) hours as needed for itching or allergies.   Past Week    Inpatient Medications:   aspirin  EC  81 mg Oral Daily   atorvastatin   40 mg Oral Daily   dabigatran   150 mg Oral Q12H   furosemide   20 mg Oral Daily   ipratropium-albuterol        metoprolol  tartrate  50 mg Oral BID   potassium chloride   40 mEq Oral BID   sodium chloride  flush  3 mL Intravenous Q12H   spironolactone   25 mg Oral Daily    Allergies:  Allergies  Allergen Reactions   Penicillins Other (See Comments)    Family History  Problem Relation Age of Onset   Cancer Mother    Diabetes Mother    Diabetes Father    Hyperlipidemia Father    Hypertension Father    Heart attack Father      Physical Exam: Vitals:   06/18/24 0933 06/18/24 0940 06/18/24 0950 06/18/24 1000  BP: 109/81 108/84 120/79 107/82  Pulse: 99 97 93 85  Resp: 12 15 19 15    Temp:      TempSrc:      SpO2: 94% 96% 91% 95%  Weight:      Height:        GEN- NAD, A&O x 3, normal affect HEENT: Normocephalic, atraumatic Lungs- CTAB, Normal effort.  Heart- Irregularly irregular rate and rhythm, No M/G/R.  GI- Soft, NT, ND.  Extremities- No clubbing, cyanosis, or edema   Radiology/Studies: EP STUDY Result Date: 06/18/2024 See surgical note for result.  ECHOCARDIOGRAM COMPLETE Result Date: 06/17/2024    ECHOCARDIOGRAM REPORT   Patient Name:   LIRON A Kelty Date of Exam:  06/17/2024 Medical Rec #:  [id number]    Height:       72.0 in Accession #:    [id number]   Weight:       252.0 lb Date of Birth:  September 21, 1961    BSA:          2.350 m Patient Age:    63 years     BP:           129/93 mmHg Patient Gender: M            HR:           99 bpm. Exam Location:  Inpatient Procedure: 2D Echo, Cardiac Doppler, Color Doppler and Intracardiac            Opacification Agent (Both Spectral and Color Flow Doppler were            utilized during procedure). Indications:    A-fib  History:        Patient has prior history of Echocardiogram examinations.                 Arrythmias:Atrial Fibrillation; Risk Factors:Hypertension.  Sonographer:    Vella Key Referring Phys: [id number] VISHAL R PATEL IMPRESSIONS  1. Left ventricular ejection fraction is variable due to afib, 40-50%. The left ventricle has mildly decreased function. The left ventricle demonstrates global hypokinesis. The left ventricular internal cavity size was mildly dilated. Left ventricular diastolic function could not be evaluated.  2. Right ventricular systolic function is mildly reduced. The right ventricular size is normal. Tricuspid regurgitation signal is inadequate for assessing PA pressure.  3. Left atrial size was mildly dilated.  4. The mitral valve is normal in structure. No evidence of mitral valve regurgitation. No evidence of mitral stenosis.  5. The aortic valve is normal in structure. Aortic valve regurgitation is not  visualized. No aortic stenosis is present.  6. The inferior vena cava is normal in size with greater than 50% respiratory variability, suggesting right atrial pressure of 3 mmHg. FINDINGS  Left Ventricle: Left ventricular ejection fraction is variable due to afib, 40-50%. The left ventricle has mildly decreased function. The left ventricle demonstrates global hypokinesis. The left ventricular internal cavity size was mildly dilated. There  is no left ventricular hypertrophy. Left ventricular diastolic function could not be evaluated due to atrial fibrillation. Left ventricular diastolic function could not be evaluated. Right Ventricle: The right ventricular size is normal. No increase in right ventricular wall thickness. Right ventricular systolic function is mildly reduced. Tricuspid regurgitation signal is inadequate for assessing PA pressure. Left Atrium: Left atrial size was mildly dilated. Right Atrium: Right atrial size was normal in size. Pericardium: There is no evidence of pericardial effusion. Mitral Valve: The mitral valve is normal in structure. No evidence of mitral valve regurgitation. No evidence of mitral valve stenosis. Tricuspid Valve: The tricuspid valve is normal in structure. Tricuspid valve regurgitation is trivial. No evidence of tricuspid stenosis. Aortic Valve: The aortic valve is normal in structure. Aortic valve regurgitation is not visualized. No aortic stenosis is present. Pulmonic Valve: The pulmonic valve was normal in structure. Pulmonic valve regurgitation is not visualized. No evidence of pulmonic stenosis. Aorta: The aortic root is normal in size and structure. Venous: The inferior vena cava is normal in size with greater than 50% respiratory variability, suggesting right atrial pressure of 3 mmHg. IAS/Shunts: No atrial level shunt detected by color flow Doppler.  LEFT VENTRICLE PLAX 2D LVIDd:  6.00 cm LVIDs:         6.00 cm LV PW:         1.30 cm LV IVS:        1.30 cm LVOT  diam:     2.10 cm LV SV:         45 LV SV Index:   19 LVOT Area:     3.46 cm  LV Volumes (MOD) LV vol d, MOD A2C: 166.0 ml LV vol d, MOD A4C: 178.0 ml LV vol s, MOD A2C: 73.2 ml LV vol s, MOD A4C: 91.2 ml LV SV MOD A2C:     92.8 ml LV SV MOD A4C:     178.0 ml LV SV MOD BP:      93.1 ml RIGHT VENTRICLE RV Basal diam:  2.90 cm RV S prime:     8.49 cm/s TAPSE (M-mode): 1.5 cm LEFT ATRIUM             Index        RIGHT ATRIUM           Index LA diam:        3.90 cm 1.66 cm/m   RA Area:     25.40 cm LA Vol (A2C):   95.4 ml 40.59 ml/m  RA Volume:   82.60 ml  35.14 ml/m LA Vol (A4C):   44.5 ml 18.93 ml/m LA Biplane Vol: 70.9 ml 30.17 ml/m  AORTIC VALVE LVOT Vmax:   81.40 cm/s LVOT Vmean:  56.900 cm/s LVOT VTI:    0.130 m  AORTA Ao Root diam: 4.20 cm Ao Asc diam:  3.50 cm MITRAL VALVE MV Area (PHT): 5.70 cm    SHUNTS MV Decel Time: 133 msec    Systemic VTI:  0.13 m MV E velocity: 85.90 cm/s  Systemic Diam: 2.10 cm MV A velocity: 32.00 cm/s MV E/A ratio:  2.68 Morene Brownie Electronically signed by Morene Brownie Signature Date/Time: 06/17/2024/1:13:54 PM    Final    DG Chest Portable 1 View Result Date: 06/16/2024 CLINICAL DATA:  Shortness of breath. EXAM: PORTABLE CHEST 1 VIEW COMPARISON:  None Available. FINDINGS: Mild diffuse increased interstitial markings with bilateral lower lobe predominance. Bilateral lung fields are otherwise clear. Bilateral costophrenic angles are clear. Normal cardio-mediastinal silhouette. No acute osseous abnormalities. The soft tissues are within normal limits. IMPRESSION: Mild diffuse increased interstitial markings with bilateral lower lobe predominance, which may represent interstitial edema versus atypical pneumonia. Electronically Signed   By: Ree Molt M.D.   On: 06/16/2024 14:51    EKG:8/3 shows AF with RVR in 130s (personally reviewed)  TELEMETRY: AF with HRs 90-110s (personally reviewed)  Assessment/Plan:  Persistent AF Failed DCC this am Recent bronchitis  may have set off.  Not currently candidate for ablation with no insurance and questionable compliance.  Not ideal candidate for tikosyn for same reasons; Were he to run into a hard spot and not be able to afford his tikosyn refill, he would need re-admission to resume.  In discussion with pt and MD, Amiodarone  is preference.  Recommend amiodarone  400 mg BID x 7 days, then 200 mg BID 7 days with a cardioversion around that time (10-14 days). Then would use 200 mg daily.   If ends up staying overnight would load with IV with bolus given RVR on admission.   Baseline CMET and TSH WNL.   Happy to consider ablation in the future if can prove compliance and is willing to pursue insurance.   Acute  systolic CHF EF 40-45%, potentially in setting of AF GDMT per Gen cards  EP will see as needed if remains here. Otherwise, OK for discharge with follow up in [place] with gen cards. Will need surveillance labs for amiodarone  in ~ 6 weeks, then ideally at least every 6 months.   For questions or updates, please contact [facility] Please consult [email or web address] for contact info under     Signed, Ozell Prentice Passey, PA-C  06/18/2024, 10:54 AM

## 2024-06-18 NOTE — Progress Notes (Addendum)
 Progress Note  Patient Name: Cody Baird Date of Encounter: 06/18/2024 [facility] Cardiologist: None   Interval Summary   Had sinus congestion about 1 month ago.  Presented to the emergency department for worsening shortness of breath.  Denies palpitations, cough, orthopnea, lower extremity edema, melena, hematochezia, and hematuria.  Vital Signs Vitals:   06/17/24 2138 06/17/24 2200 06/17/24 2334 06/18/24 0424  BP: 132/81  103/70 115/83  Pulse: 95 74 65 70  Resp:   20 18  Temp:   98.1 F (36.7 C) 97.8 F (36.6 C)  TempSrc:   Oral Oral  SpO2:  96% 97% 94%  Weight:    113.8 kg  Height:        Intake/Output Summary (Last 24 hours) at 06/18/2024 0729 Last data filed at 06/18/2024 0445 Gross per 24 hour  Intake 315.46 ml  Output 2925 ml  Net -2609.54 ml      06/18/2024    4:24 AM 06/17/2024    5:00 AM 06/16/2024    8:48 PM  Last 3 Weights  Weight (lbs) 250 lb 14.1 oz 251 lb 15.8 oz 252 lb 4.8 oz  Weight (kg) 113.8 kg 114.3 kg 114.443 kg      Telemetry/ECG  Atrial fibrillation with heart rates in the 50's to 100's. Multiple pauses of about 2 seconds the longest of which was 2.53 seconds - Personally Reviewed  Physical Exam  GEN: No acute distress.   Neck: No JVD Cardiac: Irregularly irregular rhythm, no murmurs, rubs, or gallops.  Respiratory: Diffuse wheezing heard throughout the lungs GI: Soft, nontender, non-distended  MS: No edema  Assessment & Plan  New onset A-fib RVR CHA2DS2-VASc Score = 4 [CHF History: 0, HTN History: 1, Diabetes History: 0, Stroke History: 2, Vascular Disease History: 1, Age Score: 0, Gender Score: 0].  Therefore, the patient's annual risk of stroke is 4.8 %.     Presented with worsening complaints of shortness of breath over the last 2 weeks, also with recent sinus infection/bronchitis.  Denies any other symptoms of atrial fibrillation. Was initially started on IV Cardizem  was also started on metoprolol  tartrate 25 mg twice daily.  Potassium 3.5, magnesium 2.1, creatinine 0.89, will order potassium replacement for a goal potassium greater than 4. TSH normal. Was initially started on Eliquis  5 mg twice daily.  Was transitioned over to Pradaxa  because of cost. Continue Pradaxa  150 mg twice daily. Is scheduled for a TEE cardioversion today. Stop Cardizem  due to reduced LVEF. Increase metoprolol  tartrate to 50 mg twice daily. Recommend outpatient sleep study for suspected OSA.    HFmrEF Hypertension Echo on 06/17/24 showed a reduced LVEF of 40-50%, Global hypokinesis, mildly reduced RV systolic function, mildly dilated left atrium, variable IVC indicating right atrial pressure of 3 mmHg.  BNP was 257.2. Chest x-ray showed Mild diffuse increased interstitial markings with bilateral lower lobe predominance, which may represent interstitial edema versus atypical pneumonia.  On 06/17/2024 received 40 mg IV Lasix . Stop home amlodipine  in favor of GDMT. GDMT Patient had cost concerns with Eliquis .  Will consult pharmacy to assess cost of Entresto and SGLT2 Plan to start spironolactone  25 mg daily after TEE/DCCV. Increase metoprolol  tartrate to 50 mg twice daily.  Plan to transition over to metoprolol  succinate prior to discharge.   History of CVA PAD Hyperlipidemia Of note prior heart monitor 02/2020 with no evidence of atrial fibrillation Recommend neurology assess continuing aspirin  with starting OAC as is on aspirin  for prior CVA. Continue atorvastatin  40 mg.  LDL is 55.  Goal should be less than 70.    Former smoker He is a bit wheezy on exam, reportedly heavy smoker but completely quit in 2021 after his stroke.  May benefit from pulmonary function test outpatient, also a more cardioselective beta-blocker if needed like bisoprolol if we consider that in the future    For questions or updates, please contact  [facility] Please consult [email or web address] for contact info under       Signed, Morse Clause, PA-C    I  have seen and examined the patient along with Zane Adams, PA-C .  I have reviewed the chart, notes and new data.  I agree with PA/NP's note.  No LA thrombus on TEE. Unfortunately, DCCV was unsuccessful.  Asked for EP opinion on management of his AFib. Due to young age, best options would be rhythm control with dofetilide or AFib ablation. He does not want to stay here for the 3 days required for dofetilide. Hopefully will acquire insurance which would facilitate getting AFib ablation. For now, due to low LVEF, the only good alternative is PO amiodarone  with another attempt at DCCV in 3-4 weeks. Hopefully, with rate control we will see improvement in LV function.  PLAN: PO amiodarone  load and follow up in HF TOC clinic. Try another DCCV in 3-4 weeks. Make sure there is no interruption in dabigatran  anticoagulation so that TEE is not required. Retrospectively, his CVA may have been related to occult atrial fibrillation. Stop aspirin  while on direct oral anticoagulant due to bleeding risk (particularly since he will be taking dabigatran , due to cost constraints). If LVEF does not normalize after a few months of rate/rhythm control, will need a workup for ischemic heart disease. He has not had angina pectoris. Cost/insurance issues also limit the use of standard GDMT for HF. He is on beta blockers (metoprolol  or bisoprolol preferred to carvedilol due to wheezing) and spironolactone . Can add  ARB as much as BP permits. OP sleep study.  Zylie Mumaw, MD, FACC [facility] [phone] 06/18/2024, 10:52 AM

## 2024-06-18 NOTE — Progress Notes (Signed)
 Patient had a 2.54 second pause and remained asymptomatic. MD notified via secure chat.

## 2024-06-18 NOTE — Progress Notes (Addendum)
 Cody Baird  FMW:[id number] DOB: May 01, 1961 DOA: 06/16/2024 PCP: Delbert Clam, MD    Brief Narrative:  63 year old with a history of right MCA CVA 2021, PAD status post right lower extremity bypass, HTN, HLD, and remote tobacco abuse who presented to the ER with palpitations and dyspnea on exertion becoming more frequent and severe over the last 3 months.  He initially presented to an urgent care where he was noted to be in atrial fibrillation with a heart rate of 140 and was then transported to the [place] ER.  Goals of Care:   Code Status: Full Code   DVT prophylaxis:  dabigatran  (PRADAXA ) capsule 150 mg   Interim Hx: TEE this morning revealed low EF at 35-40%.  Cardioversion was attempted x 3 but unfortunately was unsuccessful.  He is afebrile.  Blood pressure stable.  Heart rate controlled at rest, but jumping up to 140 w/ exertion, and associated with dyspnea.   Assessment & Plan:  Newly diagnosed atrial fibrillation with RVR CHA2DS2-VASc is 4 - DOAC initiated -cardiology following -with newly discovered systolic CHF diltiazem  was discontinued and metoprolol  has been initiated - TSH is normal - magnesium is normal - TEE DCCV unsuccessful today - amio being loaded IV due to exertional RVR - to consider repeat attempt at DCCV as outpatient once amio load completed   Newly diagnosed acute systolic congestive heart failure EF on TTE felt to be 35-40% - GDMT per cardiology  Pulmonary edema -dyspnea Likely due to persistent tachycardia/atrial fibrillation as well as acute systolic CHF -continue diuresis and monitor Is/Os, weights, and sx   Right MCA CVA 2021 No residual deficit -continue statin - now on DOAC   HTN Blood pressure controlled at present  HLD Continue atorvastatin   PAD Continue aspirin  and statin (ASA felt to be needed despite DOAC given hx of femoral bypass graft in 2014)  Heavy tobacco abuse The patient quit smoking at the time of his stroke in  2021  Obesity class II - Body mass index is 34.03 kg/m.   Family Communication: No family present at time of exam Disposition: Anticipate discharge home once medically stable   Objective: Blood pressure 107/82, pulse 85, temperature 98 F (36.7 C), temperature source Temporal, resp. rate 15, height 6' (1.829 m), weight 113.8 kg, SpO2 95%.  Intake/Output Summary (Last 24 hours) at 06/18/2024 1037 Last data filed at 06/18/2024 9062 Gross per 24 hour  Intake 611.85 ml  Output 2750 ml  Net -2138.15 ml   Filed Weights   06/16/24 2048 06/17/24 0500 06/18/24 0424  Weight: 114.4 kg 114.3 kg 113.8 kg    Examination: General: No acute respiratory distress Lungs: fine crackles B bases - no wheezing  Cardiovascular: Irregularly irregular without appreciable murmur or rub Abdomen: Nontender, nondistended, soft, bowel sounds positive, no rebound, no ascites, no appreciable mass Extremities: 1+ edema bilateral lower extremities  CBC: Recent Labs  Lab 06/16/24 1431 06/17/24 0508  WBC 8.8 8.3  HGB 17.2* 15.8  HCT 54.0* 49.7  MCV 93.4 94.3  PLT 158 162   Basic Metabolic Panel: Recent Labs  Lab 06/16/24 1431 06/17/24 0508 06/18/24 0608  NA 136 140 137  K 5.0 4.6 3.5  CL 109 105 105  CO2 19* 27 21*  GLUCOSE 90 114* 106*  BUN 14 11 15   CREATININE 0.93 1.10 0.89  CALCIUM  9.1 9.3 9.1  MG 2.2 2.1 2.1   GFR: Estimated Creatinine Clearance: 112.1 mL/min (by C-G formula based on SCr of 0.89 mg/dL).  Scheduled Meds:  acetaminophen   650 mg Rectal Once   aspirin  EC  81 mg Oral Daily   atorvastatin   40 mg Oral Daily   dabigatran   150 mg Oral Q12H   ipratropium-albuterol        metoprolol  tartrate  50 mg Oral BID   potassium chloride   40 mEq Oral BID   sodium chloride  flush  3 mL Intravenous Q12H   spironolactone   25 mg Oral Daily      LOS: 1 day   Reyes IVAR Moores, MD [facility] Office  [phone] Pager - Text Page per [place]  If 7PM-7AM, please contact  night-coverage per Amion 06/18/2024, 10:37 AM

## 2024-06-18 NOTE — Progress Notes (Signed)
   06/18/24 1500  Spiritual Encounters  Type of Visit Initial  Care provided to: Patient  Referral source Nurse (RN/NT/LPN)  Reason for visit Advance directives  OnCall Visit No   Chaplain responded to spiritual consult for Advanced Directives.  Patient was welcoming with pleasant affect.  Educated patient on documents and left bedside for him to discuss with his daughter, per his preference.  Patient aware to contact staff for follow up with Chaplain when ready to complete the process.   Chaplain spiritual support services remain available as the need arises.

## 2024-06-18 NOTE — Interval H&P Note (Signed)
 History and Physical Interval Note:  06/18/2024 9:04 AM  Cody Baird  has presented today for surgery, with the diagnosis of afib.  The various methods of treatment have been discussed with the patient and family. After consideration of risks, benefits and other options for treatment, the patient has consented to  Procedure(s): TRANSESOPHAGEAL ECHOCARDIOGRAM (N/A) CARDIOVERSION (N/A) as a surgical intervention.  The patient's history has been reviewed, patient examined, no change in status, stable for surgery.  I have reviewed the patient's chart and labs.  Questions were answered to the patient's satisfaction.     [name]

## 2024-06-18 NOTE — Evaluation (Signed)
 Physical Therapy Evaluation Patient Details Name: Cody Baird MRN: [id number] DOB: [birth year] Today's Date: 06/18/2024  History of Present Illness  Pt is a 63 y/o male admitted 8/3 to the ED for evaluation of palpitations and dyspnea on exertion.  Work up found afib with RVR.  TEE with cardioversion failed.  PMHx, R MCA stroke, ASPVD, HTN, PAD, PBA,  Clinical Impression  Pt admitted with/for afib with RVR and SOB.  Pt mobile at a CGA to Independent level.  Pt currently limited functionally due to the problems listed below.  (see problems list.)  Pt will benefit from PT to maximize function and safety to be able to get home safely with available assist .         If plan is discharge home, recommend the following: A little help with walking and/or transfers;A little help with bathing/dressing/bathroom;Assist for transportation;Assistance with cooking/housework   Can travel by private vehicle        Equipment Recommendations None recommended by PT  Recommendations for Other Services       Functional Status Assessment Patient has had a recent decline in their functional status and demonstrates the ability to make significant improvements in function in a reasonable and predictable amount of time.     Precautions / Restrictions Precautions Precautions: Fall      Mobility  Bed Mobility Overal bed mobility: Independent                  Transfers Overall transfer level: Independent                      Ambulation/Gait Ambulation/Gait assistance: Independent   Assistive device: None Gait Pattern/deviations: Step-through pattern Gait velocity: slower Gait velocity interpretation: 1.31 - 2.62 ft/sec, indicative of limited community ambulator   General Gait Details: Generally steady, but mildly gimpy  Careers information officer     Tilt Bed    Modified Rankin (Stroke Patients Only)       Balance Overall balance assessment: Modified  Independent                                           Pertinent Vitals/Pain Pain Assessment Pain Assessment: No/denies pain    Home Living Family/patient expects to be discharged to:: Private residence Living Arrangements: Alone Available Help at Discharge: Family;Available PRN/intermittently Type of Home: House Home Access:  (1)       Home Layout: One level Home Equipment: Grab bars - toilet;Grab bars - tub/shower;Toilet riser      Prior Function Prior Level of Function : Independent/Modified Independent (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, minerals)             Mobility Comments: I ADLs Comments: mod I     Extremity/Trunk Assessment   Upper Extremity Assessment Upper Extremity Assessment: Overall WFL for tasks assessed    Lower Extremity Assessment Lower Extremity Assessment: Overall WFL for tasks assessed       Communication   Communication Communication: No apparent difficulties    Cognition Arousal: Alert Behavior During Therapy: WFL for tasks assessed/performed   PT - Cognitive impairments: No apparent impairments                         Following commands: Intact  Cueing       General Comments General comments (skin integrity, edema, etc.): During gait HR was mildly labile in the 90's to 110's with spikes into the 120's in afib.  pt was mildly dyspneic at 2/4, but could continue if HR stays low.  SpO2 adequate on RA in the lower 90's.    Exercises     Assessment/Plan    PT Assessment Patient needs continued PT services  PT Problem List Decreased activity tolerance;Decreased balance;Decreased mobility;Cardiopulmonary status limiting activity       PT Treatment Interventions Gait training;Stair training;Functional mobility training;Therapeutic activities;Patient/family education    PT Goals (Current goals can be found in the Care Plan section)  Acute Rehab PT Goals Patient Stated Goal:  Correct this afib and go home, Independent PT Goal Formulation: With patient Time For Goal Achievement: 06/25/24 Potential to Achieve Goals: Good    Frequency Min 2X/week     Co-evaluation               AM-PAC PT 6 Clicks Mobility  Outcome Measure Help needed turning from your back to your side while in a flat bed without using bedrails?: None Help needed moving from lying on your back to sitting on the side of a flat bed without using bedrails?: None Help needed moving to and from a bed to a chair (including a wheelchair)?: None Help needed standing up from a chair using your arms (e.g., wheelchair or bedside chair)?: None Help needed to walk in hospital room?: A Little Help needed climbing 3-5 steps with a railing? : A Little 6 Click Score: 22    End of Session   Activity Tolerance: Patient tolerated treatment well;Patient limited by fatigue Patient left: in bed;with call bell/phone within reach Nurse Communication: Mobility status PT Visit Diagnosis: Difficulty in walking, not elsewhere classified (R26.2)    Time: 8197-8178 PT Time Calculation (min) (ACUTE ONLY): 19 min   Charges:   PT Evaluation $PT Eval Moderate Complexity: 1 Mod   PT General Charges $$ ACUTE PT VISIT: 1 Visit         06/18/2024  India HERO., PT Acute Rehabilitation Services [phone]  (office)  Vinie GAILS Fatih Stalvey 06/18/2024, 7:41 PM

## 2024-06-18 NOTE — Progress Notes (Signed)
 Patient requested for primary are team or nursing staff to call and update daughter post patient's TEE and cardioversion.

## 2024-06-18 NOTE — Progress Notes (Signed)
 Patient is AXOX4 with no clinical signs of distress or complaints of pain at this time.   Patient remains on continuous Amiodarone  see MAR.   Safety measures are in place, call light is within reach, and 4P's addressed.

## 2024-06-18 NOTE — Anesthesia Preprocedure Evaluation (Addendum)
 Anesthesia Evaluation  Patient identified by MRN, date of birth, ID band Patient awake    Reviewed: Allergy & Precautions, NPO status , Patient's Chart, lab work & pertinent test results, reviewed documented beta blocker date and time   Airway Mallampati: II  TM Distance: >3 FB Neck ROM: Full    Dental  (+) Teeth Intact, Dental Advisory Given, Implants   Pulmonary former smoker   Pulmonary exam normal breath sounds clear to auscultation       Cardiovascular hypertension, Pt. on home beta blockers and Pt. on medications + Peripheral Vascular Disease  Normal cardiovascular exam+ dysrhythmias Atrial Fibrillation  Rhythm:Regular Rate:Normal  TTE 2025 1. Left ventricular ejection fraction is variable due to afib, 40-50%.  The left ventricle has mildly decreased function. The left ventricle  demonstrates global hypokinesis. The left ventricular internal cavity size  was mildly dilated. Left ventricular  diastolic function could not be evaluated.   2. Right ventricular systolic function is mildly reduced. The right  ventricular size is normal. Tricuspid regurgitation signal is inadequate  for assessing PA pressure.   3. Left atrial size was mildly dilated.   4. The mitral valve is normal in structure. No evidence of mitral valve  regurgitation. No evidence of mitral stenosis.   5. The aortic valve is normal in structure. Aortic valve regurgitation is  not visualized. No aortic stenosis is present.   6. The inferior vena cava is normal in size with greater than 50%  respiratory variability, suggesting right atrial pressure of 3 mmHg.     Neuro/Psych TIACVA  negative psych ROS   GI/Hepatic negative GI ROS, Neg liver ROS,,,  Endo/Other  negative endocrine ROS    Renal/GU negative Renal ROS  negative genitourinary   Musculoskeletal  (+) Arthritis ,    Abdominal   Peds  Hematology  (+) Blood dyscrasia (eliquis )    Anesthesia Other Findings 63 year old with a history of right MCA CVA 2021, PAD status post right lower extremity bypass, HTN, HLD, and remote tobacco abuse who presented to the ER with palpitations and dyspnea on exertion found to be in afib  Reproductive/Obstetrics                              Anesthesia Physical Anesthesia Plan  ASA: 3  Anesthesia Plan: MAC   Post-op Pain Management:    Induction: Intravenous  PONV Risk Score and Plan: Propofol  infusion and Treatment may vary due to age or medical condition  Airway Management Planned: Natural Airway  Additional Equipment:   Intra-op Plan:   Post-operative Plan:   Informed Consent: I have reviewed the patients History and Physical, chart, labs and discussed the procedure including the risks, benefits and alternatives for the proposed anesthesia with the patient or authorized representative who has indicated his/her understanding and acceptance.     Dental advisory given  Plan Discussed with: CRNA  Anesthesia Plan Comments:          Anesthesia Quick Evaluation

## 2024-06-18 NOTE — Progress Notes (Signed)
 Pt's hr up to 130s-140s nonsustaining. Pt asymptomatic. Provider mcclung at bedside. Pt just given daily & new meds so will continue to monitor for now. Mozetta Murfin R, RN

## 2024-06-18 NOTE — CV Procedure (Signed)
    TRANSESOPHAGEAL ECHOCARDIOGRAM   NAME:  Cody Baird    MRN: [id number] DOB:  January 06, 1961    ADMIT DATE: 06/16/2024  INDICATIONS: Atrial fibrillation   PROCEDURE:   Informed consent was obtained prior to the procedure. The risks, benefits and alternatives for the procedure were discussed and the patient comprehended these risks.  Risks include, but are not limited to, cough, sore throat, vomiting, nausea, somnolence, esophageal and stomach trauma or perforation, bleeding, low blood pressure, aspiration, pneumonia, infection, trauma to the teeth and death.    Procedural time out performed. The oropharynx was anesthetized with viscous lidocaine .  Anesthesia was administered by the anaesthesilogy team.  The patient was administered to achieve and maintain moderate to deep conscious sedation.  The patient's heart rate, blood pressure, and oxygen saturation were monitored continuously during the procedure.  The transesophageal probe was inserted in the esophagus and stomach without difficulty and multiple views were obtained.   The patient tolerated the procedure well.  COMPLICATIONS:    There were no immediate complications.  KEY FINDINGS:  Depressed Ejection fraction, Mild mitral regurgitation no left atrial .  Full report to follow. Further management per primary team.   NO left atrial appendage clot was noted, therefore we proceeded to cardioversion. First shock 200J, two more shocks at 360J - unfortunately patient did not convert to sinus rhythm.     Dub Huntsman, DO [facility] [place]  [facility]  9:29 AM

## 2024-06-18 NOTE — Progress Notes (Signed)
 Discussed with pharmacy and patient may continue aspirin  due to femoral bypass graft in 2014.  Signed,  Morse Clause, PA-C 06/18/2024, 11:04 AM

## 2024-06-18 NOTE — Transfer of Care (Signed)
 Immediate Anesthesia Transfer of Care Note  Patient: Cody Baird  Procedure(s) Performed: TRANSESOPHAGEAL ECHOCARDIOGRAM CARDIOVERSION  Patient Location: PACU and Cath Lab  Anesthesia Type:MAC  Level of Consciousness: awake and alert   Airway & Oxygen Therapy: Patient Spontanous Breathing and Patient connected to nasal cannula oxygen  Post-op Assessment: Report given to RN and Post -op Vital signs reviewed and stable  Post vital signs: Reviewed and stable  Last Vitals:  Vitals Value Taken Time  BP 109/81 06/18/24 09:33  Temp    Pulse 94 06/18/24 09:38  Resp 14 06/18/24 09:38  SpO2 97 % 06/18/24 09:38  Vitals shown include unfiled device data.  Last Pain:  Vitals:   06/18/24 0933  TempSrc:   PainSc: 0-No pain      Patients Stated Pain Goal: 0 (06/18/24 0755)  Complications: No notable events documented.

## 2024-06-18 NOTE — Progress Notes (Signed)
   06/18/24 1500  Spiritual Encounters  Type of Visit Initial  Care provided to: Patient  Referral source Nurse (RN/NT/LPN)  Reason for visit Advance directives  OnCall Visit No  Interventions  Spiritual Care Interventions Made Established relationship of care and support;Compassionate presence;Reflective listening;Prayer;Encouragement;Normalization of emotions  Intervention Outcomes  Outcomes Connection to spiritual care;Awareness of support  Spiritual Care Plan  Spiritual Care Issues Still Outstanding No further spiritual care needs at this time (see row info)   Chaplain educated patient on Advanced Directives and left bedside for follow up with his daughter, per patient preference. Patient aware to contact staff when ready to proceed with completing the process. Spiritual support services remain available as the need arises.

## 2024-06-18 NOTE — Plan of Care (Signed)

## 2024-06-19 ENCOUNTER — Other Ambulatory Visit (HOSPITAL_COMMUNITY): Payer: Self-pay

## 2024-06-19 ENCOUNTER — Inpatient Hospital Stay (HOSPITAL_COMMUNITY): Payer: Self-pay

## 2024-06-19 LAB — BASIC METABOLIC PANEL WITH GFR
Anion gap: 7 (ref 5–15)
BUN: 16 mg/dL (ref 8–23)
CO2: 26 mmol/L (ref 22–32)
Calcium: 9.4 mg/dL (ref 8.9–10.3)
Chloride: 106 mmol/L (ref 98–111)
Creatinine, Ser: 1.07 mg/dL (ref 0.61–1.24)
GFR, Estimated: >60 mL/min (ref >60)
Glucose, Bld: 105 mg/dL — ABNORMAL HIGH (ref 70–99)
Potassium: 4.4 mmol/L (ref 3.5–5.1)
Sodium: 139 mmol/L (ref 135–145)

## 2024-06-19 MED ORDER — AMIODARONE HCL 200 MG PO TABS: 200.0000 mg | ORAL_TABLET | Freq: Two times a day (BID) | ORAL | Status: DC

## 2024-06-19 MED ORDER — LOSARTAN POTASSIUM 25 MG PO TABS
25.0000 mg | ORAL_TABLET | Freq: Every day | ORAL | 0 refills | Status: DC
  Filled 2024-06-19: qty 30, 30d supply, fill #0

## 2024-06-19 MED ORDER — FUROSEMIDE 20 MG PO TABS
20.0000 mg | ORAL_TABLET | Freq: Every day | ORAL | 0 refills | Status: DC
  Filled 2024-06-19: qty 30, 30d supply, fill #0

## 2024-06-19 MED ORDER — METOPROLOL SUCCINATE ER 100 MG PO TB24
200.0000 mg | ORAL_TABLET | Freq: Every day | ORAL | Status: DC
  Administered 2024-06-19: 200 mg via ORAL
  Filled 2024-06-19: qty 2

## 2024-06-19 MED ORDER — ALBUTEROL SULFATE HFA 108 (90 BASE) MCG/ACT IN AERS
2.0000 | INHALATION_SPRAY | Freq: Four times a day (QID) | RESPIRATORY_TRACT | 2 refills | Status: AC | PRN
  Filled 2024-06-19: qty 6.7, 25d supply, fill #0

## 2024-06-19 MED ORDER — DABIGATRAN ETEXILATE MESYLATE 150 MG PO CAPS
150.0000 mg | ORAL_CAPSULE | Freq: Two times a day (BID) | ORAL | 0 refills | Status: DC
  Filled 2024-06-19: qty 60, 30d supply, fill #0

## 2024-06-19 MED ORDER — SPIRONOLACTONE 25 MG PO TABS
25.0000 mg | ORAL_TABLET | Freq: Every day | ORAL | 0 refills | Status: DC
  Filled 2024-06-19: qty 30, 30d supply, fill #0

## 2024-06-19 MED ORDER — AMIODARONE HCL 200 MG PO TABS
ORAL_TABLET | ORAL | 0 refills | Status: DC
  Filled 2024-06-19: qty 35, 28d supply, fill #0

## 2024-06-19 MED ORDER — METOPROLOL SUCCINATE ER 200 MG PO TB24
200.0000 mg | ORAL_TABLET | Freq: Every day | ORAL | 0 refills | Status: DC
  Filled 2024-06-19: qty 30, 30d supply, fill #0

## 2024-06-19 MED ORDER — AMIODARONE HCL 200 MG PO TABS: 200.0000 mg | ORAL_TABLET | Freq: Every day | ORAL | Status: DC

## 2024-06-19 NOTE — Anesthesia Postprocedure Evaluation (Signed)
 Anesthesia Post Note  Patient: Cody Baird  Procedure(s) Performed: TRANSESOPHAGEAL ECHOCARDIOGRAM CARDIOVERSION     Patient location during evaluation: Cath Lab Anesthesia Type: MAC Level of consciousness: awake and alert Pain management: pain level controlled Vital Signs Assessment: post-procedure vital signs reviewed and stable Respiratory status: spontaneous breathing, nonlabored ventilation, respiratory function stable and patient connected to nasal cannula oxygen Cardiovascular status: stable and blood pressure returned to baseline Postop Assessment: no apparent nausea or vomiting Anesthetic complications: no   No notable events documented.  Last Vitals:  Vitals:   06/19/24 0405 06/19/24 0926  BP: 113/88 121/89  Pulse: 85 (!) 113  Resp: 20 19  Temp: 36.6 C 36.5 C  SpO2: 90%     Last Pain:  Vitals:   06/19/24 0926  TempSrc: Oral  PainSc:                  Mayley Lish L Leldon Steege

## 2024-06-19 NOTE — Progress Notes (Signed)
 STAT chest x-ray and PRN breathing treatment completed. Patient reports relief post Nebulizer.

## 2024-06-19 NOTE — Progress Notes (Addendum)
 Progress Note  Patient Name: Cody Baird Date of Encounter: 06/19/2024 [facility] Cardiologist: Redell Leiter, MD   Interval Summary   Continues to have shortness of breath, dyspnea on exertion, and difficulty sleeping.  Denies lower extremity edema, orthopnea, and PND.  Patient feels like he has been more anxious since CVA in 2021.   Vital Signs Vitals:   06/19/24 0231 06/19/24 0300 06/19/24 0400 06/19/24 0405  BP:    113/88  Pulse:    85  Resp:    20  Temp:    97.8 F (36.6 C)  TempSrc:    Oral  SpO2: 95% 92% 95% 90%  Weight:    112.8 kg  Height:        Intake/Output Summary (Last 24 hours) at 06/19/2024 0816 Last data filed at 06/18/2024 2024 Gross per 24 hour  Intake 863 ml  Output 200 ml  Net 663 ml      06/19/2024    4:05 AM 06/18/2024    4:24 AM 06/17/2024    5:00 AM  Last 3 Weights  Weight (lbs) 248 lb 11.2 oz 250 lb 14.1 oz 251 lb 15.8 oz  Weight (kg) 112.81 kg 113.8 kg 114.3 kg      Telemetry/ECG  Atrial fibrillation with heart rates in the 60's to 100's- Personally Reviewed  Physical Exam  GEN: No acute distress.  On room air Neck: No JVD Cardiac: RRR, no murmurs, rubs, or gallops.  Respiratory: Diffuse wheezing heard throughout the lungs GI: Soft, nontender, non-distended  MS: No edema. Had prior toe amputation.  Assessment & Plan  New onset A-fib RVR CHA2DS2-VASc Score = 4 [CHF History: 0, HTN History: 1, Diabetes History: 0, Stroke History: 2, Vascular Disease History: 1, Age Score: 0, Gender Score: 0].  Therefore, the patient's annual risk of stroke is 4.8 %.     Presented with worsening complaints of shortness of breath over the last 2 weeks, also with recent sinus infection/bronchitis.  Denies any other symptoms of atrial fibrillation. Was initially started on IV Cardizem  was also started on metoprolol  tartrate 25 mg twice daily. On 06/18/2024 the patient had an attempted TEE/DCCV 3 attempts were performed and were unsuccessful at converting the  patient to normal sinus rhythm. Was initially started on Eliquis  5 mg twice daily.  Was transitioned over to Pradaxa  because of cost. Continue Pradaxa  150 mg twice daily. Stop metoprolol  tartrate 100 mg twice daily.   Start metoprolol  succinate 200 mg daily. Recommend outpatient sleep study for suspected OSA. Stop IV amiodarone  at about 4:30 pm Start oral amiodarone  200 mg twice daily for 7 days.  After that transition to amiodarone  200 mg daily. Follow up with afib clinic in 2-3 weeks. Recommend DCCV in 3 to 4 weeks.     HFmrEF Hypertension Echo on 06/17/24 showed a reduced LVEF of 40-50%, Global hypokinesis, mildly reduced RV systolic function, mildly dilated left atrium, variable IVC indicating right atrial pressure of 3 mmHg.  BNP was 257.2. Chest x-ray showed Mild diffuse increased interstitial markings with bilateral lower lobe predominance, which may represent interstitial edema versus atypical pneumonia.  On 06/17/2024 received 40 mg IV Lasix .  A chest x-ray was done this morning and showed no active disease but did find aortic atherosclerosis, and emphysema Stop home amlodipine  in favor of GDMT. Plan on 20 mg IV Lasix  today. Continue oral lasix  20mg . May take an additional lasix  for 5lb weight gain, lower extremity edema, worsening shortness of breath. GDMT Patient has cost concerns  with Entresto and SGLT2 as he is uninsured.  Continue spironolactone  25 mg daily. Stop metoprolol  tartrate 100 mg twice daily.   Start metoprolol  succinate 200 mg daily. Continue losartan  25mg  daily.  Blood pressure is well-managed most recent BP is 113/88.     History of CVA PAD Hyperlipidemia Of note prior heart monitor 02/2020 with no evidence of atrial fibrillation. Patient on aspirin  and pradaxa  due to femoral bypass graft in 2014.  Continue atorvastatin  40 mg.  LDL is 55.  Goal should be less than 70.     Former smoker Emphysema He is a bit wheezy on exam, reportedly heavy smoker but  completely quit in 2021 after his stroke.  May benefit from pulmonary function test outpatient, also a more cardioselective beta-blocker if needed like bisoprolol if we consider that in the future.  Is also a poor candidate for long-term amiodarone  with his emphysema.  [facility] will sign off.   The patient is ready for discharge today from a cardiac standpoint. Medication Recommendations:  As above Other recommendations (labs, testing, etc): Recommend DCCV in 3 to 4 weeks. PFT's, metabolic panel, LFTs and TSH at follow-up Follow up as an outpatient: follow up with afib clinic in 2-3 weeks. For questions or updates, please contact [facility] Please consult [email or web address] for contact info under       Signed, Morse Clause, PA-C   I have seen and examined the patient along with Zane Adams, PA-C .  I have reviewed the chart, notes and new data.  I agree with PA/NP's note.  Key new complaints: Breathing is much better.  He did have some issues of anxiety last night, but did not have clear-cut pattern of orthopnea or PND.  The chest x-ray did not evidence of heart failure exacerbation Key examination changes: Remains in atrial fibrillation with improving renal control.  While fully relaxed the heart rate is 80 bpm, when walking around the room increases to the 120s currently.  Lungs are clear.  No edema Key new findings / data: Persistent atrial fibrillation with borderline ventricular rate control.  PLAN: Persistent atrial fibrillation rapid ventricular response and acute systolic heart failure. Will complete a 24-hour intravenous load of amiodarone  and then discharge on amiodarone  200 mg twice daily, also continue metoprolol  200 mg once daily and uninterrupted dabigatran  twice a day; then reevaluate for another attempt at cardioversion in 3-4 weeks. Unable to afford the expensive guideline directed medical therapy for heart failure with Entresto and Jardiance or Farxiga, but  will discharge with ARB, loop diuretic, spironolactone , beta-blocker. Have discussed the importance of dietary sodium restriction, daily weight monitoring (bring a log of weights to his appointment).  Also advised purchasing a personal electronic rhythm monitoring device such as a smart watch or Kardia. Due to transportation issues he prefers follow-up in [place] with Dr. Monetta, if possible.  If we can arrange that we will cancel the A-fib clinic appointment.  Ad Guttman, MD, FACC [facility] [phone] 06/19/2024, 11:26 AM

## 2024-06-19 NOTE — Progress Notes (Signed)
 Mobility Specialist Progress Note;   06/19/24 1050  Mobility  Activity Ambulated independently  Level of Assistance Standby assist, set-up cues, supervision of patient - no hands on  Assistive Device None  Distance Ambulated (ft) 20 ft  Activity Response Tolerated well  Mobility Referral Yes  Mobility visit 1 Mobility  Mobility Specialist Start Time (ACUTE ONLY) 1050  Mobility Specialist Stop Time (ACUTE ONLY) 1057  Mobility Specialist Time Calculation (min) (ACUTE ONLY) 7 min   Pt agreeable to mobility. Required no physical assistance during ambulation, SV. HR up to 116 bpm w/ activity. Pt returned back to bed and left with all needs met.   Lauraine Erm Mobility Specialist Please contact via SecureChat or [employer] [phone]

## 2024-06-19 NOTE — Progress Notes (Signed)
 Patient reported a feeling of not being able to catch my breath to this RN. Patient stated that this feeling is unchanged since admission and is not new.   RT was consulted and was asked for a breathing treatment at 0154 to which RT stated they would perform PRN neb treatment.   MD was made aware via secure chat.   Patient remains on room air with an SPO2 WNL, and RR WNL this hour.

## 2024-06-19 NOTE — Plan of Care (Signed)
  Problem: Education: Goal: Knowledge of General Education information will improve Description: Including pain rating scale, medication(s)/side effects and non-pharmacologic comfort measures Outcome: Progressing   Problem: Health Behavior/Discharge Planning: Goal: Ability to manage health-related needs will improve Outcome: Progressing   Problem: Clinical Measurements: Goal: Ability to maintain clinical measurements within normal limits will improve Outcome: Progressing Goal: Cardiovascular complication will be avoided Outcome: Progressing   Problem: Nutrition: Goal: Adequate nutrition will be maintained Outcome: Progressing   Problem: Pain Managment: Goal: General experience of comfort will improve and/or be controlled Outcome: Progressing   Problem: Clinical Measurements: Goal: Diagnostic test results will improve Outcome: Not Met (add Reason) Note: On-going goal   Problem: Activity: Goal: Risk for activity intolerance will decrease Outcome: Not Met (add Reason) Note: On-going goal   Problem: Coping: Goal: Level of anxiety will decrease Outcome: Not Met (add Reason) Note: On-going goal   Problem: Education: Goal: Knowledge of disease or condition will improve Outcome: Not Met (add Reason)

## 2024-06-19 NOTE — Plan of Care (Signed)
 Problem: Education: Goal: Knowledge of General Education information will improve Description: Including pain rating scale, medication(s)/side effects and non-pharmacologic comfort measures 06/19/2024 1544 by Harvey Tresia SAUNDERS, RN Outcome: Completed/Met 06/19/2024 1518 by Harvey Tresia SAUNDERS, RN Outcome: Adequate for Discharge 06/19/2024 1516 by Harvey Tresia SAUNDERS, RN Outcome: Adequate for Discharge   Problem: Health Behavior/Discharge Planning: Goal: Ability to manage health-related needs will improve 06/19/2024 1544 by Harvey Tresia SAUNDERS, RN Outcome: Completed/Met 06/19/2024 1518 by Harvey Tresia SAUNDERS, RN Outcome: Adequate for Discharge 06/19/2024 1516 by Harvey Tresia SAUNDERS, RN Outcome: Adequate for Discharge   Problem: Clinical Measurements: Goal: Ability to maintain clinical measurements within normal limits will improve 06/19/2024 1518 by Harvey Tresia SAUNDERS, RN Outcome: Completed/Met 06/19/2024 1516 by Harvey Tresia SAUNDERS, RN Outcome: Adequate for Discharge Goal: Will remain free from infection Outcome: Completed/Met Goal: Diagnostic test results will improve 06/19/2024 1544 by Harvey Tresia SAUNDERS, RN Outcome: Completed/Met 06/19/2024 1518 by Harvey Tresia SAUNDERS, RN Outcome: Adequate for Discharge 06/19/2024 1516 by Harvey Tresia SAUNDERS, RN Outcome: Adequate for Discharge Goal: Respiratory complications will improve 06/19/2024 1544 by Harvey Tresia SAUNDERS, RN Outcome: Completed/Met 06/19/2024 1518 by Harvey Tresia SAUNDERS, RN Outcome: Adequate for Discharge 06/19/2024 1516 by Harvey Tresia SAUNDERS, RN Outcome: Adequate for Discharge Goal: Cardiovascular complication will be avoided 06/19/2024 1544 by Harvey Tresia SAUNDERS, RN Outcome: Completed/Met 06/19/2024 1518 by Harvey Tresia SAUNDERS, RN Outcome: Adequate for Discharge 06/19/2024 1516 by Harvey Tresia SAUNDERS, RN Outcome: Adequate for Discharge   Problem: Activity: Goal: Risk for activity intolerance will decrease 06/19/2024 1544 by Harvey Tresia SAUNDERS, RN Outcome: Completed/Met 06/19/2024 1518 by Harvey Tresia SAUNDERS,  RN Outcome: Adequate for Discharge 06/19/2024 1516 by Harvey Tresia SAUNDERS, RN Outcome: Adequate for Discharge   Problem: Nutrition: Goal: Adequate nutrition will be maintained Outcome: Completed/Met   Problem: Coping: Goal: Level of anxiety will decrease 06/19/2024 1544 by Harvey Tresia SAUNDERS, RN Outcome: Completed/Met 06/19/2024 1518 by Harvey Tresia SAUNDERS, RN Outcome: Adequate for Discharge 06/19/2024 1516 by Harvey Tresia SAUNDERS, RN Outcome: Adequate for Discharge   Problem: Elimination: Goal: Will not experience complications related to bowel motility Outcome: Completed/Met Goal: Will not experience complications related to urinary retention Outcome: Completed/Met   Problem: Pain Managment: Goal: General experience of comfort will improve and/or be controlled Outcome: Completed/Met   Problem: Safety: Goal: Ability to remain free from injury will improve Outcome: Completed/Met   Problem: Skin Integrity: Goal: Risk for impaired skin integrity will decrease Outcome: Completed/Met   Problem: Education: Goal: Knowledge of disease or condition will improve 06/19/2024 1544 by Harvey Tresia SAUNDERS, RN Outcome: Completed/Met 06/19/2024 1518 by Harvey Tresia SAUNDERS, RN Outcome: Adequate for Discharge 06/19/2024 1516 by Harvey Tresia SAUNDERS, RN Outcome: Adequate for Discharge Goal: Understanding of medication regimen will improve Outcome: Completed/Met Goal: Individualized Educational Video(s) Outcome: Completed/Met   Problem: Activity: Goal: Ability to tolerate increased activity will improve 06/19/2024 1544 by Harvey Tresia SAUNDERS, RN Outcome: Completed/Met 06/19/2024 1518 by Harvey Tresia SAUNDERS, RN Outcome: Adequate for Discharge 06/19/2024 1516 by Harvey Tresia SAUNDERS, RN Outcome: Adequate for Discharge   Problem: Cardiac: Goal: Ability to achieve and maintain adequate cardiopulmonary perfusion will improve 06/19/2024 1544 by Harvey Tresia SAUNDERS, RN Outcome: Completed/Met 06/19/2024 1518 by Harvey Tresia SAUNDERS, RN Outcome: Adequate  for Discharge 06/19/2024 1516 by Harvey Tresia SAUNDERS, RN Outcome: Adequate for Discharge   Problem: Health Behavior/Discharge Planning: Goal: Ability to safely manage health-related needs after discharge will improve Outcome: Completed/Met   Problem: Education: Goal: Ability to demonstrate management of disease process will  improve 06/19/2024 1544 by Harvey Tresia SAUNDERS, RN Outcome: Completed/Met 06/19/2024 1518 by Harvey Tresia SAUNDERS, RN Outcome: Adequate for Discharge Goal: Ability to verbalize understanding of medication therapies will improve Outcome: Completed/Met Goal: Individualized Educational Video(s) 06/19/2024 1544 by Jaimin Krupka R, RN Outcome: Completed/Met 06/19/2024 1518 by Harvey Tresia SAUNDERS, RN Outcome: Adequate for Discharge   Problem: Activity: Goal: Capacity to carry out activities will improve 06/19/2024 1544 by Harvey Tresia SAUNDERS, RN Outcome: Completed/Met 06/19/2024 1518 by Harvey Tresia SAUNDERS, RN Outcome: Adequate for Discharge   Problem: Cardiac: Goal: Ability to achieve and maintain adequate cardiopulmonary perfusion will improve 06/19/2024 1544 by Harvey Tresia SAUNDERS, RN Outcome: Completed/Met 06/19/2024 1518 by Harvey Tresia SAUNDERS, RN Outcome: Adequate for Discharge

## 2024-06-19 NOTE — Discharge Summary (Signed)
 Physician Discharge Summary  Cody Baird FMW:[id number] DOB: Jan 22, 1961 DOA: 06/16/2024  PCP: Delbert Clam, MD  Admit date: 06/16/2024 Discharge date: 06/19/2024  Time spent: 40 minutes  Recommendations for Outpatient Follow-up:  Follow outpatient CBC/CMP  Follow with cardiology for consideration of another attempt at cardioversion in 3-4 weeks Wheezing noted - discharged with albuterol , consider PFT's outpatient  GDMT limited by cost, follow and adjust as able outpatient  Follow volume status, adjust diuretic as needed  Discharge Diagnoses:  Principal Problem:   Atrial fibrillation with RVR (HCC) Active Problems:   Essential hypertension   PAD (peripheral artery disease) (HCC)   History of stroke   Hyperlipidemia   Atrial fibrillation [facility])  Discharge Condition: stable  Diet recommendation: heart healthy  Filed Weights   06/17/24 0500 06/18/24 0424 06/19/24 0405  Weight: 114.3 kg 113.8 kg 112.8 kg    History of present illness:   63 year old with Robin Petrakis history of right MCA CVA 2021, PAD status post right lower extremity bypass, HTN, HLD, and remote tobacco abuse who presented to the ER with palpitations and dyspnea on exertion becoming more frequent and severe over the last 3 months. He initially presented to an urgent care where he was noted to be in atrial fibrillation with Marley Pakula heart rate of 140 and was then transported to the [place] ER.   He was found to have new onset atrial fibrillation.  Also with decreased EF.  Started on pradaxa  due to cost.  EF 35-40%, failed TEE cardioversion.  Now on amiodarone , metoprolol .  Planning for repeat DCCV outpatient after amiodarone  load.    Hospital Course:  Assessment and Plan:  Newly diagnosed atrial fibrillation with RVR CHA2DS2-VASc is 4  Pradaxa  due to cost TSH is normal, EF 35-40% on TEE TEE DCCV unsuccessful Now on amiodarone  with plan to attempt DCCV outpatient    Newly diagnosed acute systolic congestive heart failure EF  on TTE felt to be 35-40% - GDMT per cardiology   Pulmonary edema -dyspnea Resolved, recent CXR without active disease Discharging on lasix   Likely due to persistent tachycardia/atrial fibrillation as well as acute systolic CHF    Wheezing Suspected COPD No diagnosis, will d/c with albuterol  Would benefit from outpatient PFT's, consider pulmonology referral   Right MCA CVA 2021 No residual deficit -continue statin - now on DOAC + aspirin    HTN Blood pressure controlled at present   HLD Continue atorvastatin    PAD Continue aspirin  and statin (ASA felt to be needed despite DOAC given hx of femoral bypass graft in 2014)   Heavy tobacco abuse The patient quit smoking at the time of his stroke in 2021   Obesity class I Body mass index is 33.73 kg/m.    Procedures: TEE IMPRESSIONS     1. Left ventricular ejection fraction, by estimation, is 35 to 40%. The  left ventricle has moderately decreased function.   2. Right ventricular systolic function is mildly reduced. The right  ventricular size is normal.   3. Left atrial size was mildly dilated. No left atrial/left atrial  appendage thrombus was detected.   4. Malana Eberwein small pericardial effusion is present. The pericardial effusion is  circumferential.   5. The mitral valve is normal in structure. Mild mitral valve  regurgitation.   6. The aortic valve is tricuspid. Aortic valve regurgitation is not  visualized.   TEE cardioversion KEY FINDINGS:   Depressed Ejection fraction, Mild mitral regurgitation no left atrial .  Full report to follow. Further management per  primary team.    NO left atrial appendage clot was noted, therefore we proceeded to cardioversion. First shock 200J, two more shocks at 360J - unfortunately patient did not convert to sinus rhythm.   Echo IMPRESSIONS     1. Left ventricular ejection fraction is variable due to afib, 40-50%.  The left ventricle has mildly decreased function. The left  ventricle  demonstrates global hypokinesis. The left ventricular internal cavity size  was mildly dilated. Left ventricular  diastolic function could not be evaluated.   2. Right ventricular systolic function is mildly reduced. The right  ventricular size is normal. Tricuspid regurgitation signal is inadequate  for assessing PA pressure.   3. Left atrial size was mildly dilated.   4. The mitral valve is normal in structure. No evidence of mitral valve  regurgitation. No evidence of mitral stenosis.   5. The aortic valve is normal in structure. Aortic valve regurgitation is  not visualized. No aortic stenosis is present.   6. The inferior vena cava is normal in size with greater than 50%  respiratory variability, suggesting right atrial pressure of 3 mmHg.   Consultations: cardiology  Discharge Exam: Vitals:   06/19/24 1139 06/19/24 1142  BP:    Pulse: 95   Resp:  20  Temp:  98 F (36.7 C)  SpO2:  93%   Had some SOB last night, better after breathing treatment Currently feels ok  General: No acute distress. Cardiovascular: irregularly irregular  Lungs: mild wheezing on exam - intermittent Abdomen: Soft, nontender, nondistended  Neurological: Alert and oriented 3. Moves all extremities 4 with equal strength. Cranial nerves II through XII grossly intact. Extremities: No clubbing or cyanosis. No edema.   Discharge Instructions   Discharge Instructions     Amb referral to AFIB Clinic   Complete by: As directed    Call MD for:  difficulty breathing, headache or visual disturbances   Complete by: As directed    Call MD for:  extreme fatigue   Complete by: As directed    Call MD for:  hives   Complete by: As directed    Call MD for:  persistant dizziness or light-headedness   Complete by: As directed    Call MD for:  persistant nausea and vomiting   Complete by: As directed    Call MD for:  redness, tenderness, or signs of infection (pain, swelling, redness, odor or  green/yellow discharge around incision site)   Complete by: As directed    Call MD for:  severe uncontrolled pain   Complete by: As directed    Call MD for:  temperature >100.4   Complete by: As directed    Diet - low sodium heart healthy   Complete by: As directed    Discharge instructions   Complete by: As directed    You were seen for atrial fibrillation.  We attempted Yanis Juma cardioversion, but this was unsuccessful.  We've now started you on amiodarone  and will continue this outpatient.  Cardiology will follow up with you outpatient and consider Terryl Niziolek repeat cardioversion in the future.  We've started you on some new medicines for your heart failure and atrial fibrillation.    You should follow up with your cardiologist as an outpatient.    I'll send you with an albuterol  inhaler given your mild wheezing today.  You likely need pulmonary function tests and may need lung doctor follow up outpatient.  Follow up with Dr. Monetta outpatient.    Return for new,  recurrent, or worsening symptoms.  Please ask your PCP to request records from this hospitalization so they know what was done and what the next steps will be.   Increase activity slowly   Complete by: As directed       Allergies as of 06/19/2024       Reactions   Penicillins Other (See Comments)        Medication List     STOP taking these medications    amLODipine  5 MG tablet Commonly known as: NORVASC        TAKE these medications    albuterol  108 (90 Base) MCG/ACT inhaler Commonly known as: VENTOLIN  HFA Inhale 2 puffs into the lungs every 6 (six) hours as needed for wheezing or shortness of breath.   amiodarone  200 MG tablet Commonly known as: PACERONE  Take 1 tablet (200 mg total) by mouth 2 (two) times daily for 7 days, THEN 1 tablet (200 mg total) daily for 21 days. Follow with cardiology for further instructions. Start taking on: June 20, 2024   aspirin  EC 81 MG tablet Take 1 tablet (81 mg total) by mouth  daily. Swallow whole.   atorvastatin  40 MG tablet Commonly known as: LIPITOR Take 1 tablet (40 mg total) by mouth daily.   dabigatran  150 MG Caps capsule Commonly known as: PRADAXA  Take 1 capsule (150 mg total) by mouth every 12 (twelve) hours.   diphenhydrAMINE  25 MG tablet Commonly known as: BENADRYL  Take 25-50 mg by mouth every 6 (six) hours as needed for itching or allergies.   furosemide  20 MG tablet Commonly known as: LASIX  Take 1 tablet (20 mg total) by mouth daily. Start taking on: June 20, 2024   losartan  25 MG tablet Commonly known as: COZAAR  Take 1 tablet (25 mg total) by mouth daily. Start taking on: June 20, 2024   metoprolol  200 MG 24 hr tablet Commonly known as: TOPROL -XL Take 1 tablet (200 mg total) by mouth daily. Take with or immediately following Arshdeep Bolger meal. Start taking on: June 20, 2024   spironolactone  25 MG tablet Commonly known as: ALDACTONE  Take 1 tablet (25 mg total) by mouth daily. Start taking on: June 20, 2024       Allergies  Allergen Reactions   Penicillins Other (See Comments)      The results of significant diagnostics from this hospitalization (including imaging, microbiology, ancillary and laboratory) are listed below for reference.    Significant Diagnostic Studies: DG Chest Port 1 View Result Date: 06/19/2024 CLINICAL DATA:  [id number] SOB (shortness of breath) [id number] Shob / admitted for AF Evaluate interstitial edema atypical pneumonia. EXAM: PORTABLE CHEST 1 VIEW COMPARISON:  Chest x-ray 06/16/2024, CT angiography neck 01/27/2020 FINDINGS: The heart and mediastinal contours are unchanged. Atherosclerotic plaque. No focal consolidation. Chronic coarsened interstitial markings with no overt pulmonary edema. No pleural effusion. No pneumothorax. No acute osseous abnormality. IMPRESSION: 1. No active disease. 2. Aortic Atherosclerosis (ICD10-I70.0) and Emphysema (ICD10-J43.9). Electronically Signed   By: Morgane  Naveau M.D.   On:  06/19/2024 02:36   ECHO TEE Result Date: 06/18/2024    TRANSESOPHOGEAL ECHO REPORT   Patient Name:   BEREN YNIGUEZ Plunk Date of Exam: 06/18/2024 Medical Rec #:  [id number]    Height:       72.0 in Accession #:    [id number]   Weight:       250.9 lb Date of Birth:  [birth year]    BSA:          2.346 m Patient Age:  63 years     BP:           127/66 mmHg Patient Gender: M            HR:           109 bpm. Exam Location:  Inpatient Procedure: Transesophageal Echo, Cardiac Doppler and Color Doppler (Both            Spectral and Color Flow Doppler were utilized during procedure). Indications:     I48.91* Unspecified atrial fibrillation  History:         Patient has prior history of Echocardiogram examinations, most                  recent 06/17/2024. PAD, Arrythmias:Atrial Fibrillation; Risk                  Factors:Hypertension and Dyslipidemia.  Sonographer:     Damien Senior RDCS Referring Phys:  [id number] SHENG L HALEY Diagnosing Phys: Kardie Tobb DO PROCEDURE: After discussion of the risks and benefits of Blondell Laperle TEE, an informed consent was obtained from the patient. The transesophogeal probe was passed without difficulty through the esophogus of the patient. Sedation performed by different physician. The patient was monitored while under deep sedation. Anesthestetic sedation was provided intravenously by Anesthesiology: 314mg  of Propofol , 100mg  of Lidocaine . The patient developed no complications during the procedure. An unsuccessful direct current cardioversion was performed at 360 joules with 3 attempts.  IMPRESSIONS  1. Left ventricular ejection fraction, by estimation, is 35 to 40%. The left ventricle has moderately decreased function.  2. Right ventricular systolic function is mildly reduced. The right ventricular size is normal.  3. Left atrial size was mildly dilated. No left atrial/left atrial appendage thrombus was detected.  4. Endi Lagman small pericardial effusion is present. The pericardial effusion is circumferential.  5. The  mitral valve is normal in structure. Mild mitral valve regurgitation.  6. The aortic valve is tricuspid. Aortic valve regurgitation is not visualized. FINDINGS  Left Ventricle: Left ventricular ejection fraction, by estimation, is 35 to 40%. The left ventricle has moderately decreased function. The left ventricular internal cavity size was normal in size. Right Ventricle: The right ventricular size is normal. No increase in right ventricular wall thickness. Right ventricular systolic function is mildly reduced. Left Atrium: Left atrial size was mildly dilated. No left atrial/left atrial appendage thrombus was detected. Right Atrium: Right atrial size was normal in size. Pericardium: Kalika Smay small pericardial effusion is present. The pericardial effusion is circumferential. Mitral Valve: The mitral valve is normal in structure. Mild mitral valve regurgitation. Tricuspid Valve: The tricuspid valve is normal in structure. Tricuspid valve regurgitation is trivial. Aortic Valve: The aortic valve is tricuspid. Aortic valve regurgitation is not visualized. Pulmonic Valve: The pulmonic valve was normal in structure. Pulmonic valve regurgitation is trivial. Aorta: The aortic root and ascending aorta are structurally normal, with no evidence of dilitation. Venous: The left upper pulmonary vein, left lower pulmonary vein, right upper pulmonary vein and right lower pulmonary vein are normal. IAS/Shunts: No atrial level shunt detected by color flow Doppler. Additional Comments: Spectral Doppler performed.  AORTA Ao Asc diam: 3.50 cm Dub Tobb DO Electronically signed by Dub Huntsman DO Signature Date/Time: 06/18/2024/2:05:44 PM    Final    EP STUDY Result Date: 06/18/2024 See surgical note for result.  ECHOCARDIOGRAM COMPLETE Result Date: 06/17/2024    ECHOCARDIOGRAM REPORT   Patient Name:   SUSANO Vivian Okelley Walbert Date of Exam: 06/17/2024 Medical Rec #:  [id number]    Height:       72.0 in Accession #:    [id number]   Weight:       252.0 lb  Date of Birth:  [birth year]    BSA:          2.350 m Patient Age:    63 years     BP:           129/93 mmHg Patient Gender: M            HR:           99 bpm. Exam Location:  Inpatient Procedure: 2D Echo, Cardiac Doppler, Color Doppler and Intracardiac            Opacification Agent (Both Spectral and Color Flow Doppler were            utilized during procedure). Indications:    Theresia Pree-fib  History:        Patient has prior history of Echocardiogram examinations.                 Arrythmias:Atrial Fibrillation; Risk Factors:Hypertension.  Sonographer:    Vella Key Referring Phys: [id number] VISHAL R PATEL IMPRESSIONS  1. Left ventricular ejection fraction is variable due to afib, 40-50%. The left ventricle has mildly decreased function. The left ventricle demonstrates global hypokinesis. The left ventricular internal cavity size was mildly dilated. Left ventricular diastolic function could not be evaluated.  2. Right ventricular systolic function is mildly reduced. The right ventricular size is normal. Tricuspid regurgitation signal is inadequate for assessing PA pressure.  3. Left atrial size was mildly dilated.  4. The mitral valve is normal in structure. No evidence of mitral valve regurgitation. No evidence of mitral stenosis.  5. The aortic valve is normal in structure. Aortic valve regurgitation is not visualized. No aortic stenosis is present.  6. The inferior vena cava is normal in size with greater than 50% respiratory variability, suggesting right atrial pressure of 3 mmHg. FINDINGS  Left Ventricle: Left ventricular ejection fraction is variable due to afib, 40-50%. The left ventricle has mildly decreased function. The left ventricle demonstrates global hypokinesis. The left ventricular internal cavity size was mildly dilated. There  is no left ventricular hypertrophy. Left ventricular diastolic function could not be evaluated due to atrial fibrillation. Left ventricular diastolic function could not be evaluated.  Right Ventricle: The right ventricular size is normal. No increase in right ventricular wall thickness. Right ventricular systolic function is mildly reduced. Tricuspid regurgitation signal is inadequate for assessing PA pressure. Left Atrium: Left atrial size was mildly dilated. Right Atrium: Right atrial size was normal in size. Pericardium: There is no evidence of pericardial effusion. Mitral Valve: The mitral valve is normal in structure. No evidence of mitral valve regurgitation. No evidence of mitral valve stenosis. Tricuspid Valve: The tricuspid valve is normal in structure. Tricuspid valve regurgitation is trivial. No evidence of tricuspid stenosis. Aortic Valve: The aortic valve is normal in structure. Aortic valve regurgitation is not visualized. No aortic stenosis is present. Pulmonic Valve: The pulmonic valve was normal in structure. Pulmonic valve regurgitation is not visualized. No evidence of pulmonic stenosis. Aorta: The aortic root is normal in size and structure. Venous: The inferior vena cava is normal in size with greater than 50% respiratory variability, suggesting right atrial pressure of 3 mmHg. IAS/Shunts: No atrial level shunt detected by color flow Doppler.  LEFT VENTRICLE PLAX 2D LVIDd:         6.00 cm LVIDs:  6.00 cm LV PW:         1.30 cm LV IVS:        1.30 cm LVOT diam:     2.10 cm LV SV:         45 LV SV Index:   19 LVOT Area:     3.46 cm  LV Volumes (MOD) LV vol d, MOD A2C: 166.0 ml LV vol d, MOD A4C: 178.0 ml LV vol s, MOD A2C: 73.2 ml LV vol s, MOD A4C: 91.2 ml LV SV MOD A2C:     92.8 ml LV SV MOD A4C:     178.0 ml LV SV MOD BP:      93.1 ml RIGHT VENTRICLE RV Basal diam:  2.90 cm RV S prime:     8.49 cm/s TAPSE (M-mode): 1.5 cm LEFT ATRIUM             Index        RIGHT ATRIUM           Index LA diam:        3.90 cm 1.66 cm/m   RA Area:     25.40 cm LA Vol (A2C):   95.4 ml 40.59 ml/m  RA Volume:   82.60 ml  35.14 ml/m LA Vol (A4C):   44.5 ml 18.93 ml/m LA Biplane Vol:  70.9 ml 30.17 ml/m  AORTIC VALVE LVOT Vmax:   81.40 cm/s LVOT Vmean:  56.900 cm/s LVOT VTI:    0.130 m  AORTA Ao Root diam: 4.20 cm Ao Asc diam:  3.50 cm MITRAL VALVE MV Area (PHT): 5.70 cm    SHUNTS MV Decel Time: 133 msec    Systemic VTI:  0.13 m MV E velocity: 85.90 cm/s  Systemic Diam: 2.10 cm MV Arali Somera velocity: 32.00 cm/s MV E/Eldin Bonsell ratio:  2.68 Morene Brownie Electronically signed by Morene Brownie Signature Date/Time: 06/17/2024/1:13:54 PM    Final    DG Chest Portable 1 View Result Date: 06/16/2024 CLINICAL DATA:  Shortness of breath. EXAM: PORTABLE CHEST 1 VIEW COMPARISON:  None Available. FINDINGS: Mild diffuse increased interstitial markings with bilateral lower lobe predominance. Bilateral lung fields are otherwise clear. Bilateral costophrenic angles are clear. Normal cardio-mediastinal silhouette. No acute osseous abnormalities. The soft tissues are within normal limits. IMPRESSION: Mild diffuse increased interstitial markings with bilateral lower lobe predominance, which may represent interstitial edema versus atypical pneumonia. Electronically Signed   By: Ree Molt M.D.   On: 06/16/2024 14:51    Microbiology: No results found for this or any previous visit (from the past 240 hours).   Labs: Basic Metabolic Panel: Recent Labs  Lab 06/16/24 1431 06/17/24 0508 06/18/24 0608 06/19/24 0500  NA 136 140 137 139  K 5.0 4.6 3.5 4.4  CL 109 105 105 106  CO2 19* 27 21* 26  GLUCOSE 90 114* 106* 105*  BUN 14 11 15 16   CREATININE 0.93 1.10 0.89 1.07  CALCIUM  9.1 9.3 9.1 9.4  MG 2.2 2.1 2.1  --    Liver Function Tests: No results for input(s): AST, ALT, ALKPHOS, BILITOT, PROT, ALBUMIN in the last 168 hours. No results for input(s): LIPASE, AMYLASE in the last 168 hours. No results for input(s): AMMONIA in the last 168 hours. CBC: Recent Labs  Lab 06/16/24 1431 06/17/24 0508  WBC 8.8 8.3  HGB 17.2* 15.8  HCT 54.0* 49.7  MCV 93.4 94.3  PLT 158 162   Cardiac  Enzymes: No results for input(s): CKTOTAL, CKMB, CKMBINDEX, TROPONINI in  the last 168 hours. BNP: BNP (last 3 results) Recent Labs    06/16/24 1519  BNP 257.2*    ProBNP (last 3 results) No results for input(s): PROBNP in the last 8760 hours.  CBG: No results for input(s): GLUCAP in the last 168 hours.     Signed:  Meliton Monte MD.  [facility] 06/19/2024, 2:59 PM

## 2024-06-19 NOTE — Plan of Care (Signed)
  Problem: Education: Goal: Knowledge of General Education information will improve Description: Including pain rating scale, medication(s)/side effects and non-pharmacologic comfort measures Outcome: Adequate for Discharge   Problem: Health Behavior/Discharge Planning: Goal: Ability to manage health-related needs will improve Outcome: Adequate for Discharge   Problem: Clinical Measurements: Goal: Ability to maintain clinical measurements within normal limits will improve Outcome: Adequate for Discharge Goal: Will remain free from infection Outcome: Completed/Met Goal: Diagnostic test results will improve Outcome: Adequate for Discharge Goal: Respiratory complications will improve Outcome: Adequate for Discharge Goal: Cardiovascular complication will be avoided Outcome: Adequate for Discharge   Problem: Activity: Goal: Risk for activity intolerance will decrease Outcome: Adequate for Discharge   Problem: Nutrition: Goal: Adequate nutrition will be maintained Outcome: Completed/Met   Problem: Coping: Goal: Level of anxiety will decrease Outcome: Adequate for Discharge   Problem: Elimination: Goal: Will not experience complications related to bowel motility Outcome: Completed/Met Goal: Will not experience complications related to urinary retention Outcome: Completed/Met   Problem: Pain Managment: Goal: General experience of comfort will improve and/or be controlled Outcome: Completed/Met   Problem: Safety: Goal: Ability to remain free from injury will improve Outcome: Completed/Met   Problem: Skin Integrity: Goal: Risk for impaired skin integrity will decrease Outcome: Completed/Met   Problem: Education: Goal: Knowledge of disease or condition will improve Outcome: Adequate for Discharge Goal: Understanding of medication regimen will improve Outcome: Completed/Met Goal: Individualized Educational Video(s) Outcome: Completed/Met   Problem: Activity: Goal:  Ability to tolerate increased activity will improve Outcome: Adequate for Discharge   Problem: Cardiac: Goal: Ability to achieve and maintain adequate cardiopulmonary perfusion will improve Outcome: Adequate for Discharge   Problem: Health Behavior/Discharge Planning: Goal: Ability to safely manage health-related needs after discharge will improve Outcome: Completed/Met

## 2024-06-19 NOTE — Evaluation (Signed)
 Occupational Therapy Evaluation Patient Details Name: Cody Baird MRN: [id number] DOB: 1960/12/16 Today's Date: 06/19/2024   History of Present Illness   Pt is a 63 y/o male admitted 8/3 to the ED for evaluation of palpitations and dyspnea on exertion.  Work up found afib with RVR.  TEE with cardioversion failed.  PMHx, R MCA stroke, ASPVD, HTN, PAD, PBA,     Clinical Impressions Pt presents with decline in function and safety with ADLs and ADL mobility with impaired activity tolerance/endurance. PTA pt lived alone and was Ind with ADLs, IADLs, home mgt, mobility, drives, grocery shops. Pt currently requires CGA/Sup with LB ADLs and ADL mobility with SOB with DOE, O2 SATs stable >88% for in room activity, HR 98-104. Pt reports that at home, he gets SOB and loses energy during kitchen tasks, at grocery store, walking to mailbox and bringing groceries into the house. Pt educated on energy conservation strategies with handouts provided. No not anticipate any follow up OT needs after acute care stay. OT will follow acutely to maximize level of function and safety     If plan is discharge home, recommend the following:   A little help with bathing/dressing/bathroom;Assistance with cooking/housework     Functional Status Assessment   Patient has had a recent decline in their functional status and demonstrates the ability to make significant improvements in function in a reasonable and predictable amount of time.     Equipment Recommendations   Tub/shower seat;Other (comment) (LH bath sponge)     Recommendations for Other Services         Precautions/Restrictions   Precautions Precautions: Fall Restrictions Weight Bearing Restrictions Per Provider Order: No     Mobility Bed Mobility Overal bed mobility: Independent                  Transfers Overall transfer level: Needs assistance Equipment used: None, 1 person hand held assist Transfers: Sit to/from Stand,  Bed to chair/wheelchair/BSC Sit to Stand: Contact guard assist, Supervision, Modified independent (Device/Increase time)           General transfer comment: CGA sit-stand from EOB, Mod I to commode and Sup in/out of shower      Balance Overall balance assessment: Modified Independent                                         ADL either performed or assessed with clinical judgement   ADL Overall ADL's : Needs assistance/impaired Eating/Feeding: Independent   Grooming: Wash/dry hands;Wash/dry face;Oral care;Set up;Modified independent;Sitting;Standing   Upper Body Bathing: Modified independent   Lower Body Bathing: Contact guard assist;Supervison/ safety   Upper Body Dressing : Modified independent   Lower Body Dressing: Contact guard assist;Supervision/safety   Toilet Transfer: Modified Independent;Ambulation;Regular Social worker and Hygiene: Modified independent   Tub/ Engineer, structural: Supervision/safety   Functional mobility during ADLs: Contact guard assist;Supervision/safety General ADL Comments: pt educated on energy conservation strategies with handouts provided     Vision Ability to See in Adequate Light: 0 Adequate Patient Visual Report: No change from baseline       Perception         Praxis         Pertinent Vitals/Pain Pain Assessment Pain Assessment: No/denies pain     Extremity/Trunk Assessment Upper Extremity Assessment Upper Extremity Assessment: Overall WFL for tasks assessed   Lower Extremity  Assessment Lower Extremity Assessment: Defer to PT evaluation   Cervical / Trunk Assessment Cervical / Trunk Assessment: Normal   Communication Communication Communication: No apparent difficulties   Cognition Arousal: Alert Behavior During Therapy: WFL for tasks assessed/performed Cognition: No apparent impairments                               Following commands: Intact        Cueing  General Comments   Cueing Techniques: Verbal cues      Exercises     Shoulder Instructions      Home Living Family/patient expects to be discharged to:: Private residence Living Arrangements: Alone Available Help at Discharge: Family;Available PRN/intermittently Type of Home: House Home Access: Stairs to enter [employer] of Steps: 3 Entrance Stairs-Rails: Right Home Layout: One level     Bathroom Shower/Tub: Walk-in shower;Tub/shower unit   Bathroom Toilet: Handicapped height     Home Equipment: Grab bars - toilet;Grab bars - tub/shower;Toilet riser          Prior Functioning/Environment Prior Level of Function : Independent/Modified Independent;Driving             Mobility Comments: Ind, no AD ADLs Comments: Ind - mod I with ADLs/selfcare, IADLs, home mgt, grocery shopping    OT Problem List: Decreased activity tolerance;Decreased knowledge of use of DME or AE   OT Treatment/Interventions: Self-care/ADL training;Patient/family education;Therapeutic activities;Energy conservation;DME and/or AE instruction      OT Goals(Current goals can be found in the care plan section)   Acute Rehab OT Goals Patient Stated Goal: go home OT Goal Formulation: With patient Time For Goal Achievement: 07/03/24 Potential to Achieve Goals: Good ADL Goals Pt Will Perform Lower Body Bathing: with supervision;with modified independence;sit to/from stand Pt Will Perform Lower Body Dressing: with supervision;with modified independence;sit to/from stand Pt Will Perform Tub/Shower Transfer: with modified independence;ambulating;shower seat Additional ADL Goal #1: Pt will verbalize and demo 3 energy conservation strategies for ADLs and ADL mobility tasks   OT Frequency:  Min 2X/week    Co-evaluation              AM-PAC OT 6 Clicks Daily Activity     Outcome Measure Help from another person eating meals?: None Help from another person taking care of  personal grooming?: None Help from another person toileting, which includes using toliet, bedpan, or urinal?: A Little Help from another person bathing (including washing, rinsing, drying)?: A Little Help from another person to put on and taking off regular upper body clothing?: None Help from another person to put on and taking off regular lower body clothing?: A Little 6 Click Score: 21   End of Session Nurse Communication: Mobility status  Activity Tolerance: Patient tolerated treatment well Patient left: in bed  OT Visit Diagnosis: Muscle weakness (generalized) (M62.81)                Time: 9040-8973 OT Time Calculation (min): 27 min Charges:  OT General Charges $OT Visit: 1 Visit OT Evaluation $OT Eval Low Complexity: 1 Low OT Treatments $Self Care/Home Management : 8-22 mins    Jacques Karna Loose 06/19/2024, 12:26 PM

## 2024-06-19 NOTE — Plan of Care (Signed)
  Problem: Education: Goal: Knowledge of General Education information will improve Description: Including pain rating scale, medication(s)/side effects and non-pharmacologic comfort measures 06/19/2024 1518 by Harvey Tresia SAUNDERS, RN Outcome: Adequate for Discharge 06/19/2024 1516 by Harvey Tresia SAUNDERS, RN Outcome: Adequate for Discharge   Problem: Health Behavior/Discharge Planning: Goal: Ability to manage health-related needs will improve 06/19/2024 1518 by Harvey Tresia SAUNDERS, RN Outcome: Adequate for Discharge 06/19/2024 1516 by Harvey Tresia SAUNDERS, RN Outcome: Adequate for Discharge   Problem: Clinical Measurements: Goal: Ability to maintain clinical measurements within normal limits will improve 06/19/2024 1518 by Harvey Tresia SAUNDERS, RN Outcome: Completed/Met 06/19/2024 1516 by Harvey Tresia SAUNDERS, RN Outcome: Adequate for Discharge Goal: Will remain free from infection Outcome: Completed/Met Goal: Diagnostic test results will improve 06/19/2024 1518 by Harvey Tresia SAUNDERS, RN Outcome: Adequate for Discharge 06/19/2024 1516 by Harvey Tresia SAUNDERS, RN Outcome: Adequate for Discharge Goal: Respiratory complications will improve 06/19/2024 1518 by Harvey Tresia SAUNDERS, RN Outcome: Adequate for Discharge 06/19/2024 1516 by Harvey Tresia SAUNDERS, RN Outcome: Adequate for Discharge Goal: Cardiovascular complication will be avoided 06/19/2024 1518 by Harvey Tresia SAUNDERS, RN Outcome: Adequate for Discharge 06/19/2024 1516 by Harvey Tresia SAUNDERS, RN Outcome: Adequate for Discharge   Problem: Activity: Goal: Risk for activity intolerance will decrease 06/19/2024 1518 by Harvey Tresia SAUNDERS, RN Outcome: Adequate for Discharge 06/19/2024 1516 by Harvey Tresia SAUNDERS, RN Outcome: Adequate for Discharge   Problem: Nutrition: Goal: Adequate nutrition will be maintained Outcome: Completed/Met   Problem: Coping: Goal: Level of anxiety will decrease 06/19/2024 1518 by Harvey Tresia SAUNDERS, RN Outcome: Adequate for Discharge 06/19/2024 1516 by Harvey Tresia SAUNDERS,  RN Outcome: Adequate for Discharge   Problem: Elimination: Goal: Will not experience complications related to bowel motility Outcome: Completed/Met Goal: Will not experience complications related to urinary retention Outcome: Completed/Met   Problem: Pain Managment: Goal: General experience of comfort will improve and/or be controlled Outcome: Completed/Met   Problem: Safety: Goal: Ability to remain free from injury will improve Outcome: Completed/Met   Problem: Skin Integrity: Goal: Risk for impaired skin integrity will decrease Outcome: Completed/Met   Problem: Education: Goal: Knowledge of disease or condition will improve 06/19/2024 1518 by Harvey Tresia SAUNDERS, RN Outcome: Adequate for Discharge 06/19/2024 1516 by Harvey Tresia SAUNDERS, RN Outcome: Adequate for Discharge Goal: Understanding of medication regimen will improve Outcome: Completed/Met Goal: Individualized Educational Video(s) Outcome: Completed/Met   Problem: Activity: Goal: Ability to tolerate increased activity will improve 06/19/2024 1518 by Harvey Tresia SAUNDERS, RN Outcome: Adequate for Discharge 06/19/2024 1516 by Harvey Tresia SAUNDERS, RN Outcome: Adequate for Discharge   Problem: Cardiac: Goal: Ability to achieve and maintain adequate cardiopulmonary perfusion will improve 06/19/2024 1518 by Harvey Tresia SAUNDERS, RN Outcome: Adequate for Discharge 06/19/2024 1516 by Harvey Tresia SAUNDERS, RN Outcome: Adequate for Discharge   Problem: Health Behavior/Discharge Planning: Goal: Ability to safely manage health-related needs after discharge will improve Outcome: Completed/Met   Problem: Education: Goal: Ability to demonstrate management of disease process will improve Outcome: Adequate for Discharge Goal: Ability to verbalize understanding of medication therapies will improve Outcome: Completed/Met Goal: Individualized Educational Video(s) Outcome: Adequate for Discharge   Problem: Cardiac: Goal: Ability to achieve and maintain  adequate cardiopulmonary perfusion will improve Outcome: Adequate for Discharge

## 2024-06-20 ENCOUNTER — Telehealth: Payer: Self-pay

## 2024-06-20 NOTE — Transitions of Care (Post Inpatient/ED Visit) (Signed)
 06/20/2024  Name: Cody Baird MRN: [id number] DOB: 24-Oct-1961  Today's TOC FU Call Status: Today's TOC FU Call Status:: Successful TOC FU Call Completed TOC FU Call Complete Date: 06/20/24 Patient's Name and Date of Birth confirmed.  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 He stated that he makes too much money because he has several rental properties and he does not qualify for Medicaid. When I asked if he has ever contacted the [employer] about private insurance, he said he did years ago and was told he would have to pay $500/month and decided against that.  I explained to him that I can refer him to [facility] and request that a Health Navigator contact him to discuss options for health insurance. there may be plans available that can meet his needs with a more reasonable monthly co-pay.  He was in agreement and I placed the referral to [employer] [facility]. Patient Questions/Concerns Addressed: Other: (referred to [facility] as noted)  Items Reviewed: Did you receive and understand the discharge instructions provided?: Yes (He said he also has a copy of the AVS for his dadughter to review) Medications obtained,verified, and reconciled?: Yes (Medications Reviewed) (He has all medications and did not have any questions about the med regime) Any new allergies since your discharge?: No Dietary orders reviewed?: Yes Type of Diet Ordered:: heart healthy, low sodium.  He said that he knows he needs to make changes with his diet. Do you have support at home?: Yes People in Home [RPT]: alone, child(ren), adult Name of Support/Comfort Primary  Source: He said his daughter can help when needed  Medications Reviewed Today: Medications Reviewed Today     Reviewed by Marvis Bradley, RN (Case Manager) on 06/20/24 at 1111  Med List Status: <None>   Medication Order Taking? Sig Documenting Provider Last Dose Status Informant  albuterol  (VENTOLIN  HFA) 108 (90 Base) MCG/ACT inhaler [id number]  Inhale 2 puffs into the lungs every 6 (six) hours as needed for wheezing or shortness of breath. Perri DELENA Meliton Mickey., MD  Active   amiodarone  (PACERONE ) 200 MG tablet [id number]  Take 1 tablet (200 mg total) by mouth 2 (two) times daily for 7 days, THEN 1 tablet (200 mg total) daily for 21 days. Follow with cardiology for further instructions. Perri DELENA Meliton Mickey., MD  Active   aspirin  EC 81 MG tablet [id number] No Take 1 tablet (81 mg total) by mouth daily. Swallow whole. Monetta Redell PARAS, MD 06/16/2024 Active Self, Pharmacy Records  atorvastatin  (LIPITOR) 40 MG tablet [id number] No Take 1 tablet (40 mg total) by mouth daily. Newlin, Enobong, MD Past Week Active Self, Pharmacy Records  dabigatran  (PRADAXA ) 150 MG CAPS capsule [id number]  Take 1 capsule (150 mg total) by mouth every 12 (twelve) hours. Perri DELENA Meliton Mickey., MD  Active   diphenhydrAMINE  (BENADRYL ) 25 MG tablet [id number] No Take 25-50 mg by mouth every 6 (six) hours as needed for itching or allergies. [provider] Past Week Active Self, Pharmacy Records  furosemide  (LASIX ) 20 MG tablet [id number]  Take 1 tablet (20 mg total) by mouth daily. Perri DELENA Meliton Mickey., MD  Active   losartan  (COZAAR ) 25 MG tablet [id number]  Take 1  tablet (25 mg total) by mouth daily. Perri DELENA Meliton Mickey., MD  Active   metoprolol  (TOPROL -XL) 200 MG 24 hr tablet [id number]  Take 1 tablet (200 mg total) by mouth daily. Take with or immediately following a meal. Perri DELENA Meliton Mickey., MD  Active   spironolactone  (ALDACTONE ) 25 MG tablet [id number]  Take 1 tablet (25 mg total) by mouth daily. Perri DELENA Meliton Mickey., MD  Active             Home Care and Equipment/Supplies: Were Home Health Services Ordered?: No Any new equipment or medical supplies ordered?: No  Functional Questionnaire: Do you need assistance with bathing/showering or dressing?: No (He stated that he has a scale and will be checking his weights  daily and keeping a log for the cardiologist) Do you need assistance with meal preparation?: No Do you need assistance with eating?: No Do you have difficulty maintaining continence: No Do you need assistance with getting out of bed/getting out of a chair/moving?: No Do you have difficulty managing or taking your medications?: No  Follow up appointments reviewed: PCP Follow-up appointment confirmed?: Yes Date of PCP follow-up appointment?: 08/07/24 Follow-up Provider: Dr Delbert.  I offered to schedule an appointment for him to be seen sooner and he wanted to keep the appointment he has Specialist Hospital Follow-up appointment confirmed?: Yes Date of Specialist follow-up appointment?: 07/05/24 Follow-Up Specialty Provider:: cardiology Do you need transportation to your follow-up appointment?: No Do you understand care options if your condition(s) worsen?: Yes-patient verbalized understanding    SIGNATURE Slater Diesel, RN

## 2024-07-04 NOTE — Progress Notes (Addendum)
 Cardiology Office Note   Date:  07/09/2024  ID:  [name], DOB 10/19/1961, MRN [id number] PCP: Delbert Clam, MD  [facility] Providers Cardiologist:  Redell Leiter, MD Cardiology APP:  Carlin Delon BROCKS, NP     History of Present Illness Cody Baird is a 63 y.o. male with a past medical history of stroke 2021, CAD, hypertension, PAD, dyslipidemia, HFmrEF, PAD s/p RLE bypass.  06/18/2024 echo EF 35 to 40%, LV with moderately decreased function, no L AAA thrombus detected, small pericardial effusion circumferential, mild MR 06/18/2024 DCCV x 3 unsuccessful  03/10/2020 monitor average heart rate 63 bpm, no episodes of atrial fibrillation/flutter 01/28/2020 echo EF 60 to 65%  He initially established care with Dr. Leiter in 2021 following recent stroke.  Echo at that time revealed an EF of 60 to 65%.  He wore a monitor to assess for episodes of atrial fibrillation which was negative for atrial fibrillation.  He was most recently evaluated by Dr. Leiter on 01/10/2024, he was advised to get a smart watch to assess for episodes of atrial fibrillation.  Admitted to [facility] 06/16/2024 to 06/19/2024 for A-fib with RVR, echo revealed newly decreased EF, he failed TEE cardioversion, he was started on Pradaxa  secondary to cost, loaded with amiodarone  and metoprolol  with a plan to repeat DCCV outpatient after amiodarone  load.  He presents today for follow-up after his recent hospitalization as outlined above.  He is still in atrial fibrillation, his rate is controlled.  He is overall feeling okay. He has a lot of questions surrounding his atrial fibrillation and is understandably concerned. He denies chest pain, palpitations, dyspnea, pnd, orthopnea, n, v, dizziness, syncope, edema, weight gain, or early satiety.    ROS: Review of Systems  Constitutional:  Positive for malaise/fatigue.  All other systems reviewed and are negative.    Studies Reviewed EKG  Interpretation Date/Time:  Friday July 05 2024 14:07:51 EDT Ventricular Rate:  92 PR Interval:    QRS Duration:  96 QT Interval:  370 QTC Calculation: 457 R Axis:   87  Text Interpretation: Atrial fibrillation When compared with ECG of 16-Jun-2024 14:28, PREVIOUS ECG IS PRESENT Confirmed by Carlin Delon [phone]) on 07/05/2024 2:10:39 PM    Cardiac Studies & Procedures   ______________________________________________________________________________________________     ECHOCARDIOGRAM  ECHOCARDIOGRAM COMPLETE 06/17/2024  Narrative ECHOCARDIOGRAM REPORT    Patient Name:   Cody Baird Date of Exam: 06/17/2024 Medical Rec #:  [id number]    Height:       72.0 in Accession #:    [id number]   Weight:       252.0 lb Date of Birth:  02-17-1961    BSA:          2.350 m Patient Age:    63 years     BP:           129/93 mmHg Patient Gender: M            HR:           99 bpm. Exam Location:  Inpatient  Procedure: 2D Echo, Cardiac Doppler, Color Doppler and Intracardiac Opacification Agent (Both Spectral and Color Flow Doppler were utilized during procedure).  Indications:    A-fib  History:        Patient has prior history of Echocardiogram examinations. Arrythmias:Atrial Fibrillation; Risk Factors:Hypertension.  Sonographer:    Vella Key Referring Phys: [id number] VISHAL R PATEL  IMPRESSIONS   1. Left ventricular ejection fraction is variable  due to afib, 40-50%. The left ventricle has mildly decreased function. The left ventricle demonstrates global hypokinesis. The left ventricular internal cavity size was mildly dilated. Left ventricular diastolic function could not be evaluated. 2. Right ventricular systolic function is mildly reduced. The right ventricular size is normal. Tricuspid regurgitation signal is inadequate for assessing PA pressure. 3. Left atrial size was mildly dilated. 4. The mitral valve is normal in structure. No evidence of mitral valve regurgitation. No  evidence of mitral stenosis. 5. The aortic valve is normal in structure. Aortic valve regurgitation is not visualized. No aortic stenosis is present. 6. The inferior vena cava is normal in size with greater than 50% respiratory variability, suggesting right atrial pressure of 3 mmHg.  FINDINGS Left Ventricle: Left ventricular ejection fraction is variable due to afib, 40-50%. The left ventricle has mildly decreased function. The left ventricle demonstrates global hypokinesis. The left ventricular internal cavity size was mildly dilated. There is no left ventricular hypertrophy. Left ventricular diastolic function could not be evaluated due to atrial fibrillation. Left ventricular diastolic function could not be evaluated.  Right Ventricle: The right ventricular size is normal. No increase in right ventricular wall thickness. Right ventricular systolic function is mildly reduced. Tricuspid regurgitation signal is inadequate for assessing PA pressure.  Left Atrium: Left atrial size was mildly dilated.  Right Atrium: Right atrial size was normal in size.  Pericardium: There is no evidence of pericardial effusion.  Mitral Valve: The mitral valve is normal in structure. No evidence of mitral valve regurgitation. No evidence of mitral valve stenosis.  Tricuspid Valve: The tricuspid valve is normal in structure. Tricuspid valve regurgitation is trivial. No evidence of tricuspid stenosis.  Aortic Valve: The aortic valve is normal in structure. Aortic valve regurgitation is not visualized. No aortic stenosis is present.  Pulmonic Valve: The pulmonic valve was normal in structure. Pulmonic valve regurgitation is not visualized. No evidence of pulmonic stenosis.  Aorta: The aortic root is normal in size and structure.  Venous: The inferior vena cava is normal in size with greater than 50% respiratory variability, suggesting right atrial pressure of 3 mmHg.  IAS/Shunts: No atrial level shunt detected  by color flow Doppler.   LEFT VENTRICLE PLAX 2D LVIDd:         6.00 cm LVIDs:         6.00 cm LV PW:         1.30 cm LV IVS:        1.30 cm LVOT diam:     2.10 cm LV SV:         45 LV SV Index:   19 LVOT Area:     3.46 cm  LV Volumes (MOD) LV vol d, MOD A2C: 166.0 ml LV vol d, MOD A4C: 178.0 ml LV vol s, MOD A2C: 73.2 ml LV vol s, MOD A4C: 91.2 ml LV SV MOD A2C:     92.8 ml LV SV MOD A4C:     178.0 ml LV SV MOD BP:      93.1 ml  RIGHT VENTRICLE RV Basal diam:  2.90 cm RV S prime:     8.49 cm/s TAPSE (M-mode): 1.5 cm  LEFT ATRIUM             Index        RIGHT ATRIUM           Index LA diam:        3.90 cm 1.66 cm/m   RA Area:  25.40 cm LA Vol (A2C):   95.4 ml 40.59 ml/m  RA Volume:   82.60 ml  35.14 ml/m LA Vol (A4C):   44.5 ml 18.93 ml/m LA Biplane Vol: 70.9 ml 30.17 ml/m AORTIC VALVE LVOT Vmax:   81.40 cm/s LVOT Vmean:  56.900 cm/s LVOT VTI:    0.130 m  AORTA Ao Root diam: 4.20 cm Ao Asc diam:  3.50 cm  MITRAL VALVE MV Area (PHT): 5.70 cm    SHUNTS MV Decel Time: 133 msec    Systemic VTI:  0.13 m MV E velocity: 85.90 cm/s  Systemic Diam: 2.10 cm MV A velocity: 32.00 cm/s MV E/A ratio:  2.68  Morene Brownie Electronically signed by Morene Brownie Signature Date/Time: 06/17/2024/1:13:54 PM    Final   TEE  ECHO TEE 06/18/2024  Narrative TRANSESOPHOGEAL ECHO REPORT    Patient Name:   KIRSTEN A Vanorder Date of Exam: 06/18/2024 Medical Rec #:  [id number]    Height:       72.0 in Accession #:    [id number]   Weight:       250.9 lb Date of Birth:  [birth year]    BSA:          2.346 m Patient Age:    63 years     BP:           127/66 mmHg Patient Gender: M            HR:           109 bpm. Exam Location:  Inpatient  Procedure: Transesophageal Echo, Cardiac Doppler and Color Doppler (Both Spectral and Color Flow Doppler were utilized during procedure).  Indications:     I48.91* Unspecified atrial fibrillation  History:         Patient has prior  history of Echocardiogram examinations, most recent 06/17/2024. PAD, Arrythmias:Atrial Fibrillation; Risk Factors:Hypertension and Dyslipidemia.  Sonographer:     Damien Senior RDCS Referring Phys:  [id number] SHENG L HALEY Diagnosing Phys: Kardie Tobb DO  PROCEDURE: After discussion of the risks and benefits of a TEE, an informed consent was obtained from the patient. The transesophogeal probe was passed without difficulty through the esophogus of the patient. Sedation performed by different physician. The patient was monitored while under deep sedation. Anesthestetic sedation was provided intravenously by Anesthesiology: 314mg  of Propofol , 100mg  of Lidocaine . The patient developed no complications during the procedure. An unsuccessful direct current cardioversion was performed at 360 joules with 3 attempts.  IMPRESSIONS   1. Left ventricular ejection fraction, by estimation, is 35 to 40%. The left ventricle has moderately decreased function. 2. Right ventricular systolic function is mildly reduced. The right ventricular size is normal. 3. Left atrial size was mildly dilated. No left atrial/left atrial appendage thrombus was detected. 4. A small pericardial effusion is present. The pericardial effusion is circumferential. 5. The mitral valve is normal in structure. Mild mitral valve regurgitation. 6. The aortic valve is tricuspid. Aortic valve regurgitation is not visualized.  FINDINGS Left Ventricle: Left ventricular ejection fraction, by estimation, is 35 to 40%. The left ventricle has moderately decreased function. The left ventricular internal cavity size was normal in size.  Right Ventricle: The right ventricular size is normal. No increase in right ventricular wall thickness. Right ventricular systolic function is mildly reduced.  Left Atrium: Left atrial size was mildly dilated. No left atrial/left atrial appendage thrombus was detected.  Right Atrium: Right atrial size was normal in  size.  Pericardium: A small pericardial effusion is  present. The pericardial effusion is circumferential.  Mitral Valve: The mitral valve is normal in structure. Mild mitral valve regurgitation.  Tricuspid Valve: The tricuspid valve is normal in structure. Tricuspid valve regurgitation is trivial.  Aortic Valve: The aortic valve is tricuspid. Aortic valve regurgitation is not visualized.  Pulmonic Valve: The pulmonic valve was normal in structure. Pulmonic valve regurgitation is trivial.  Aorta: The aortic root and ascending aorta are structurally normal, with no evidence of dilitation.  Venous: The left upper pulmonary vein, left lower pulmonary vein, right upper pulmonary vein and right lower pulmonary vein are normal.  IAS/Shunts: No atrial level shunt detected by color flow Doppler.  Additional Comments: Spectral Doppler performed.   AORTA Ao Asc diam: 3.50 cm  Kardie Tobb DO Electronically signed by Dub Huntsman DO Signature Date/Time: 06/18/2024/2:05:44 PM    Final  MONITORS  LONG TERM MONITOR (3-14 DAYS) 03/11/2020  Narrative ZIO monitor was performed 13 days 23 h beginning 03/10/2020 to assess for atrial fibrillation in the setting of stroke and suspected embolic etiology.  The predominant rhythm was sinus with average minimum of maximum rates of 63, 37 112 bpm.  The bradycardia was nocturnal sinus bradycardia.  There were no pauses of 3 s or greater and no episodes of 2nd or third-degree AV nodal block or sinus node exit block.  There were no triggered or diary events.  Ventricular ectopy was rare.  Supraventricular ectopy was rare without atrial fibrillation or flutter.  There were 37 brief runs of atrial premature contractions the longest 11.6 s rate of 121 bpm and the morphology was paroxysmal atrial tachycardia.   Conclusion overall rare ventricular and supraventricular ectopy with brief episodes of atrial tachycardia.  Atrial fibrillation flutter is not  documented.       ______________________________________________________________________________________________      Risk Assessment/Calculations  CHA2DS2-VASc Score = 4   This indicates a 4.8% annual risk of stroke. The patient's score is based upon: CHF History: 0 HTN History: 1 Diabetes History: 0 Stroke History: 2 Vascular Disease History: 1 Age Score: 0 Gender Score: 0         Physical Exam VS:  BP 100/70   Pulse 92   Ht 6' (1.829 m)   Wt 248 lb 12.8 oz (112.9 kg)   SpO2 95%   BMI 33.74 kg/m        Wt Readings from Last 3 Encounters:  07/05/24 248 lb 12.8 oz (112.9 kg)  06/19/24 248 lb 11.2 oz (112.8 kg)  02/07/24 247 lb 3.2 oz (112.1 kg)    GEN: Well nourished, well developed in no acute distress NECK: No JVD; No carotid bruits CARDIAC: Irregularly irregular,, no murmurs, rubs, gallops RESPIRATORY:  Clear to auscultation without rales, wheezing or rhonchi  ABDOMEN: Soft, non-tender, non-distended EXTREMITIES:  No edema; No deformity   ASSESSMENT AND PLAN Persistent atrial fibrillation/hypercoagulable state/on amiodarone  therapy-CHA2DS2-VASc score is 4, his rate is controlled. Continue amiodarone  200 mg daily, continue Pradaxa  150 mg twice daily-- denies hematochezia, hematuria, or hemoptysis -- no missed doses.  Continue metoprolol  200 mg daily.  Discussed with DOD, will have him come back in 2 weeks for repeat EKG to give amiodarone  time to work, if he has not converted will plan with DCCV. Refer to EP as well to discuss ablation as he does not want to stay on medications long term.   Hypertension - BP today is controlled at 100/70, continue losartan  25 mg daily, metoprolol  200 mg daily, spironolactone  25 mg daily.  History  of stroke-currently on aspirin  81 mg daily.  Dyslipidemia-currently on Lipitor 40 mg daily, followed by his PCP.       Dispo: Refer to EP to discuss ablation, follow-up in 10 days for nurse visit for repeat EKG to see if he is still  in atrial fibrillation, if so we will arrange for DCCV.  Follow-up in 4 weeks with Dr. Monetta.  ** 07/16/2024, return for nurse visit, he is still in atrial fibrillation, feeling poorly, would like to proceed with DCCV. Discussed with DOD and primary cardiologist Dr. Monetta, we discussed risk versus benefit including: skin irritation, risk of aspiration, heart arrhythmia, respiratory failure and death. He is agreeable to proceed with DCCV, questions answered to his satisfaction. Confirmed he has been on Pradaxa  without any missed doses for three weeks, and to notify our office if he inadvertently misses any doses.   Informed Consent   Shared Decision Making/Informed Consent The risks (stroke, cardiac arrhythmias rarely resulting in the need for a temporary or permanent pacemaker, skin irritation or burns and complications associated with conscious sedation including aspiration, arrhythmia, respiratory failure and death), benefits (restoration of normal sinus rhythm) and alternatives of a direct current cardioversion were explained in detail to Mr. Kimberley and he agrees to proceed.      Signed, Delon JAYSON Hoover, NP

## 2024-07-04 NOTE — H&P (View-Only) (Signed)
 Cardiology Office Note   Date:  07/09/2024  ID:  [name], DOB 10/19/1961, MRN [id number] PCP: Delbert Clam, MD  [facility] Providers Cardiologist:  Redell Leiter, MD Cardiology APP:  Carlin Delon BROCKS, NP     History of Present Illness Cody Baird is a 63 y.o. male with a past medical history of stroke 2021, CAD, hypertension, PAD, dyslipidemia, HFmrEF, PAD s/p RLE bypass.  06/18/2024 echo EF 35 to 40%, LV with moderately decreased function, no L AAA thrombus detected, small pericardial effusion circumferential, mild MR 06/18/2024 DCCV x 3 unsuccessful  03/10/2020 monitor average heart rate 63 bpm, no episodes of atrial fibrillation/flutter 01/28/2020 echo EF 60 to 65%  He initially established care with Dr. Leiter in 2021 following recent stroke.  Echo at that time revealed an EF of 60 to 65%.  He wore a monitor to assess for episodes of atrial fibrillation which was negative for atrial fibrillation.  He was most recently evaluated by Dr. Leiter on 01/10/2024, he was advised to get a smart watch to assess for episodes of atrial fibrillation.  Admitted to [facility] 06/16/2024 to 06/19/2024 for A-fib with RVR, echo revealed newly decreased EF, he failed TEE cardioversion, he was started on Pradaxa  secondary to cost, loaded with amiodarone  and metoprolol  with a plan to repeat DCCV outpatient after amiodarone  load.  He presents today for follow-up after his recent hospitalization as outlined above.  He is still in atrial fibrillation, his rate is controlled.  He is overall feeling okay. He has a lot of questions surrounding his atrial fibrillation and is understandably concerned. He denies chest pain, palpitations, dyspnea, pnd, orthopnea, n, v, dizziness, syncope, edema, weight gain, or early satiety.    ROS: Review of Systems  Constitutional:  Positive for malaise/fatigue.  All other systems reviewed and are negative.    Studies Reviewed EKG  Interpretation Date/Time:  Friday July 05 2024 14:07:51 EDT Ventricular Rate:  92 PR Interval:    QRS Duration:  96 QT Interval:  370 QTC Calculation: 457 R Axis:   87  Text Interpretation: Atrial fibrillation When compared with ECG of 16-Jun-2024 14:28, PREVIOUS ECG IS PRESENT Confirmed by Carlin Delon [phone]) on 07/05/2024 2:10:39 PM    Cardiac Studies & Procedures   ______________________________________________________________________________________________     ECHOCARDIOGRAM  ECHOCARDIOGRAM COMPLETE 06/17/2024  Narrative ECHOCARDIOGRAM REPORT    Patient Name:   Cody Baird Date of Exam: 06/17/2024 Medical Rec #:  [id number]    Height:       72.0 in Accession #:    [id number]   Weight:       252.0 lb Date of Birth:  02-17-1961    BSA:          2.350 m Patient Age:    63 years     BP:           129/93 mmHg Patient Gender: M            HR:           99 bpm. Exam Location:  Inpatient  Procedure: 2D Echo, Cardiac Doppler, Color Doppler and Intracardiac Opacification Agent (Both Spectral and Color Flow Doppler were utilized during procedure).  Indications:    A-fib  History:        Patient has prior history of Echocardiogram examinations. Arrythmias:Atrial Fibrillation; Risk Factors:Hypertension.  Sonographer:    Vella Key Referring Phys: [id number] VISHAL R PATEL  IMPRESSIONS   1. Left ventricular ejection fraction is variable  due to afib, 40-50%. The left ventricle has mildly decreased function. The left ventricle demonstrates global hypokinesis. The left ventricular internal cavity size was mildly dilated. Left ventricular diastolic function could not be evaluated. 2. Right ventricular systolic function is mildly reduced. The right ventricular size is normal. Tricuspid regurgitation signal is inadequate for assessing PA pressure. 3. Left atrial size was mildly dilated. 4. The mitral valve is normal in structure. No evidence of mitral valve regurgitation. No  evidence of mitral stenosis. 5. The aortic valve is normal in structure. Aortic valve regurgitation is not visualized. No aortic stenosis is present. 6. The inferior vena cava is normal in size with greater than 50% respiratory variability, suggesting right atrial pressure of 3 mmHg.  FINDINGS Left Ventricle: Left ventricular ejection fraction is variable due to afib, 40-50%. The left ventricle has mildly decreased function. The left ventricle demonstrates global hypokinesis. The left ventricular internal cavity size was mildly dilated. There is no left ventricular hypertrophy. Left ventricular diastolic function could not be evaluated due to atrial fibrillation. Left ventricular diastolic function could not be evaluated.  Right Ventricle: The right ventricular size is normal. No increase in right ventricular wall thickness. Right ventricular systolic function is mildly reduced. Tricuspid regurgitation signal is inadequate for assessing PA pressure.  Left Atrium: Left atrial size was mildly dilated.  Right Atrium: Right atrial size was normal in size.  Pericardium: There is no evidence of pericardial effusion.  Mitral Valve: The mitral valve is normal in structure. No evidence of mitral valve regurgitation. No evidence of mitral valve stenosis.  Tricuspid Valve: The tricuspid valve is normal in structure. Tricuspid valve regurgitation is trivial. No evidence of tricuspid stenosis.  Aortic Valve: The aortic valve is normal in structure. Aortic valve regurgitation is not visualized. No aortic stenosis is present.  Pulmonic Valve: The pulmonic valve was normal in structure. Pulmonic valve regurgitation is not visualized. No evidence of pulmonic stenosis.  Aorta: The aortic root is normal in size and structure.  Venous: The inferior vena cava is normal in size with greater than 50% respiratory variability, suggesting right atrial pressure of 3 mmHg.  IAS/Shunts: No atrial level shunt detected  by color flow Doppler.   LEFT VENTRICLE PLAX 2D LVIDd:         6.00 cm LVIDs:         6.00 cm LV PW:         1.30 cm LV IVS:        1.30 cm LVOT diam:     2.10 cm LV SV:         45 LV SV Index:   19 LVOT Area:     3.46 cm  LV Volumes (MOD) LV vol d, MOD A2C: 166.0 ml LV vol d, MOD A4C: 178.0 ml LV vol s, MOD A2C: 73.2 ml LV vol s, MOD A4C: 91.2 ml LV SV MOD A2C:     92.8 ml LV SV MOD A4C:     178.0 ml LV SV MOD BP:      93.1 ml  RIGHT VENTRICLE RV Basal diam:  2.90 cm RV S prime:     8.49 cm/s TAPSE (M-mode): 1.5 cm  LEFT ATRIUM             Index        RIGHT ATRIUM           Index LA diam:        3.90 cm 1.66 cm/m   RA Area:  25.40 cm LA Vol (A2C):   95.4 ml 40.59 ml/m  RA Volume:   82.60 ml  35.14 ml/m LA Vol (A4C):   44.5 ml 18.93 ml/m LA Biplane Vol: 70.9 ml 30.17 ml/m AORTIC VALVE LVOT Vmax:   81.40 cm/s LVOT Vmean:  56.900 cm/s LVOT VTI:    0.130 m  AORTA Ao Root diam: 4.20 cm Ao Asc diam:  3.50 cm  MITRAL VALVE MV Area (PHT): 5.70 cm    SHUNTS MV Decel Time: 133 msec    Systemic VTI:  0.13 m MV E velocity: 85.90 cm/s  Systemic Diam: 2.10 cm MV A velocity: 32.00 cm/s MV E/A ratio:  2.68  Morene Brownie Electronically signed by Morene Brownie Signature Date/Time: 06/17/2024/1:13:54 PM    Final   TEE  ECHO TEE 06/18/2024  Narrative TRANSESOPHOGEAL ECHO REPORT    Patient Name:   KIRSTEN A Vanorder Date of Exam: 06/18/2024 Medical Rec #:  [id number]    Height:       72.0 in Accession #:    [id number]   Weight:       250.9 lb Date of Birth:  [birth year]    BSA:          2.346 m Patient Age:    63 years     BP:           127/66 mmHg Patient Gender: M            HR:           109 bpm. Exam Location:  Inpatient  Procedure: Transesophageal Echo, Cardiac Doppler and Color Doppler (Both Spectral and Color Flow Doppler were utilized during procedure).  Indications:     I48.91* Unspecified atrial fibrillation  History:         Patient has prior  history of Echocardiogram examinations, most recent 06/17/2024. PAD, Arrythmias:Atrial Fibrillation; Risk Factors:Hypertension and Dyslipidemia.  Sonographer:     Damien Senior RDCS Referring Phys:  [id number] SHENG L HALEY Diagnosing Phys: Kardie Tobb DO  PROCEDURE: After discussion of the risks and benefits of a TEE, an informed consent was obtained from the patient. The transesophogeal probe was passed without difficulty through the esophogus of the patient. Sedation performed by different physician. The patient was monitored while under deep sedation. Anesthestetic sedation was provided intravenously by Anesthesiology: 314mg  of Propofol , 100mg  of Lidocaine . The patient developed no complications during the procedure. An unsuccessful direct current cardioversion was performed at 360 joules with 3 attempts.  IMPRESSIONS   1. Left ventricular ejection fraction, by estimation, is 35 to 40%. The left ventricle has moderately decreased function. 2. Right ventricular systolic function is mildly reduced. The right ventricular size is normal. 3. Left atrial size was mildly dilated. No left atrial/left atrial appendage thrombus was detected. 4. A small pericardial effusion is present. The pericardial effusion is circumferential. 5. The mitral valve is normal in structure. Mild mitral valve regurgitation. 6. The aortic valve is tricuspid. Aortic valve regurgitation is not visualized.  FINDINGS Left Ventricle: Left ventricular ejection fraction, by estimation, is 35 to 40%. The left ventricle has moderately decreased function. The left ventricular internal cavity size was normal in size.  Right Ventricle: The right ventricular size is normal. No increase in right ventricular wall thickness. Right ventricular systolic function is mildly reduced.  Left Atrium: Left atrial size was mildly dilated. No left atrial/left atrial appendage thrombus was detected.  Right Atrium: Right atrial size was normal in  size.  Pericardium: A small pericardial effusion is  present. The pericardial effusion is circumferential.  Mitral Valve: The mitral valve is normal in structure. Mild mitral valve regurgitation.  Tricuspid Valve: The tricuspid valve is normal in structure. Tricuspid valve regurgitation is trivial.  Aortic Valve: The aortic valve is tricuspid. Aortic valve regurgitation is not visualized.  Pulmonic Valve: The pulmonic valve was normal in structure. Pulmonic valve regurgitation is trivial.  Aorta: The aortic root and ascending aorta are structurally normal, with no evidence of dilitation.  Venous: The left upper pulmonary vein, left lower pulmonary vein, right upper pulmonary vein and right lower pulmonary vein are normal.  IAS/Shunts: No atrial level shunt detected by color flow Doppler.  Additional Comments: Spectral Doppler performed.   AORTA Ao Asc diam: 3.50 cm  Kardie Tobb DO Electronically signed by Dub Huntsman DO Signature Date/Time: 06/18/2024/2:05:44 PM    Final  MONITORS  LONG TERM MONITOR (3-14 DAYS) 03/11/2020  Narrative ZIO monitor was performed 13 days 23 h beginning 03/10/2020 to assess for atrial fibrillation in the setting of stroke and suspected embolic etiology.  The predominant rhythm was sinus with average minimum of maximum rates of 63, 37 112 bpm.  The bradycardia was nocturnal sinus bradycardia.  There were no pauses of 3 s or greater and no episodes of 2nd or third-degree AV nodal block or sinus node exit block.  There were no triggered or diary events.  Ventricular ectopy was rare.  Supraventricular ectopy was rare without atrial fibrillation or flutter.  There were 37 brief runs of atrial premature contractions the longest 11.6 s rate of 121 bpm and the morphology was paroxysmal atrial tachycardia.   Conclusion overall rare ventricular and supraventricular ectopy with brief episodes of atrial tachycardia.  Atrial fibrillation flutter is not  documented.       ______________________________________________________________________________________________      Risk Assessment/Calculations  CHA2DS2-VASc Score = 4   This indicates a 4.8% annual risk of stroke. The patient's score is based upon: CHF History: 0 HTN History: 1 Diabetes History: 0 Stroke History: 2 Vascular Disease History: 1 Age Score: 0 Gender Score: 0         Physical Exam VS:  BP 100/70   Pulse 92   Ht 6' (1.829 m)   Wt 248 lb 12.8 oz (112.9 kg)   SpO2 95%   BMI 33.74 kg/m        Wt Readings from Last 3 Encounters:  07/05/24 248 lb 12.8 oz (112.9 kg)  06/19/24 248 lb 11.2 oz (112.8 kg)  02/07/24 247 lb 3.2 oz (112.1 kg)    GEN: Well nourished, well developed in no acute distress NECK: No JVD; No carotid bruits CARDIAC: Irregularly irregular,, no murmurs, rubs, gallops RESPIRATORY:  Clear to auscultation without rales, wheezing or rhonchi  ABDOMEN: Soft, non-tender, non-distended EXTREMITIES:  No edema; No deformity   ASSESSMENT AND PLAN Persistent atrial fibrillation/hypercoagulable state/on amiodarone  therapy-CHA2DS2-VASc score is 4, his rate is controlled. Continue amiodarone  200 mg daily, continue Pradaxa  150 mg twice daily-- denies hematochezia, hematuria, or hemoptysis -- no missed doses.  Continue metoprolol  200 mg daily.  Discussed with DOD, will have him come back in 2 weeks for repeat EKG to give amiodarone  time to work, if he has not converted will plan with DCCV. Refer to EP as well to discuss ablation as he does not want to stay on medications long term.   Hypertension - BP today is controlled at 100/70, continue losartan  25 mg daily, metoprolol  200 mg daily, spironolactone  25 mg daily.  History  of stroke-currently on aspirin  81 mg daily.  Dyslipidemia-currently on Lipitor 40 mg daily, followed by his PCP.       Dispo: Refer to EP to discuss ablation, follow-up in 10 days for nurse visit for repeat EKG to see if he is still  in atrial fibrillation, if so we will arrange for DCCV.  Follow-up in 4 weeks with Dr. Monetta.  ** 07/16/2024, return for nurse visit, he is still in atrial fibrillation, feeling poorly, would like to proceed with DCCV. Discussed with DOD and primary cardiologist Dr. Monetta, we discussed risk versus benefit including: skin irritation, risk of aspiration, heart arrhythmia, respiratory failure and death. He is agreeable to proceed with DCCV, questions answered to his satisfaction. Confirmed he has been on Pradaxa  without any missed doses for three weeks, and to notify our office if he inadvertently misses any doses.   Informed Consent   Shared Decision Making/Informed Consent The risks (stroke, cardiac arrhythmias rarely resulting in the need for a temporary or permanent pacemaker, skin irritation or burns and complications associated with conscious sedation including aspiration, arrhythmia, respiratory failure and death), benefits (restoration of normal sinus rhythm) and alternatives of a direct current cardioversion were explained in detail to Mr. Kimberley and he agrees to proceed.      Signed, Delon JAYSON Hoover, NP

## 2024-07-05 ENCOUNTER — Encounter: Payer: Self-pay | Admitting: Cardiology

## 2024-07-05 ENCOUNTER — Ambulatory Visit: Payer: Self-pay | Attending: Cardiology | Admitting: Cardiology

## 2024-07-05 VITALS — BP 100/70 | HR 92 | Ht 72.0 in | Wt 248.8 lb

## 2024-07-05 DIAGNOSIS — D6859 Other primary thrombophilia: Secondary | ICD-10-CM

## 2024-07-05 DIAGNOSIS — I639 Cerebral infarction, unspecified: Secondary | ICD-10-CM

## 2024-07-05 DIAGNOSIS — I10 Essential (primary) hypertension: Secondary | ICD-10-CM

## 2024-07-05 DIAGNOSIS — I480 Paroxysmal atrial fibrillation: Secondary | ICD-10-CM

## 2024-07-05 DIAGNOSIS — E782 Mixed hyperlipidemia: Secondary | ICD-10-CM

## 2024-07-05 DIAGNOSIS — I4891 Unspecified atrial fibrillation: Secondary | ICD-10-CM

## 2024-07-05 NOTE — Patient Instructions (Signed)
 Medication Instructions:  Your physician recommends that you continue on your current medications as directed. Please refer to the Current Medication list given to you today.  *If you need a refill on your cardiac medications before your next appointment, please call your pharmacy*   Lab Work: None ordered If you have labs (blood work) drawn today and your tests are completely normal, you will receive your results only by: MyChart Message (if you have MyChart) OR A paper copy in the mail If you have any lab test that is abnormal or we need to change your treatment, we will call you to review the results.   Testing/Procedures: None ordered   Follow-Up: At [facility], you and your health needs are our priority.  As part of our continuing mission to provide you with exceptional heart care, we have created designated Provider Care Teams.  These Care Teams include your primary Cardiologist (physician) and Advanced Practice Providers (APPs -  Physician Assistants and Nurse Practitioners) who all work together to provide you with the care you need, when you need it.  We recommend signing up for the patient portal called MyChart.  Sign up information is provided on this After Visit Summary.  MyChart is used to connect with patients for Virtual Visits (Telemedicine).  Patients are able to view lab/test results, encounter notes, upcoming appointments, etc.  Non-urgent messages can be sent to your provider as well.   To learn more about what you can do with MyChart, go to [email or web address].    Your next appointment:   4 week(s)  The format for your next appointment:   In Person  Provider:   Redell Leiter, MD    Other Instructions none  Important Information About Sugar

## 2024-07-11 ENCOUNTER — Ambulatory Visit (HOSPITAL_COMMUNITY): Payer: Self-pay | Admitting: Internal Medicine

## 2024-07-12 ENCOUNTER — Other Ambulatory Visit: Payer: Self-pay | Admitting: Cardiology

## 2024-07-12 DIAGNOSIS — I4891 Unspecified atrial fibrillation: Secondary | ICD-10-CM

## 2024-07-12 DIAGNOSIS — I63411 Cerebral infarction due to embolism of right middle cerebral artery: Secondary | ICD-10-CM

## 2024-07-12 MED ORDER — AMIODARONE HCL 200 MG PO TABS: 200.0000 mg | ORAL_TABLET | Freq: Every day | ORAL | 3 refills | Status: AC

## 2024-07-12 MED ORDER — DABIGATRAN ETEXILATE MESYLATE 150 MG PO CAPS: 150.0000 mg | ORAL_CAPSULE | Freq: Two times a day (BID) | ORAL | 5 refills | Status: DC

## 2024-07-12 NOTE — Telephone Encounter (Signed)
*  STAT* If patient is at the pharmacy, call can be transferred to refill team.   1. Which medications need to be refilled? (please list name of each medication and dose if known) amiodarone  (PACERONE ) 200 MG tablet and dabigatran  (PRADAXA ) 150 MG CAPS capsule     2. Would you like to learn more about the convenience, safety, & potential cost savings by using the [facility]? NO   3. Are you open to using the [facility] NO   4. Which pharmacy/location (including street and city if local pharmacy) is medication to be sent to? [facility] - [place], [place] - [address]     5. Do they need a 30 day or 90 day supply? 30  Patient is going to run out before Tuesday and we will be closed on Monday so he is worried about running out of medication.

## 2024-07-12 NOTE — Telephone Encounter (Signed)
 Pt is requesting a refill on medications amiodarone  and dabigatran  (Pradaxa ) that were prescribed in the hospital. Would Dr. Monetta like to refill these medications? Please address

## 2024-07-12 NOTE — Telephone Encounter (Signed)
 Pradaxa  150mg  refill request received. Pt is 63 years old, weight-112.9kg, Crea-1.07 on 06/19/24, last seen by Delon Hoover on 07/05/24, Diagnosis-Afib & CVA, CrCl-114.31 mL/min; Dose is appropriate based on dosing criteria. Will send in refill to requested pharmacy.     Per 06/18/24 Hospital note by Jodie Passey it states: Started on pradaxa  due to lack of insurance and unable to afford Eliquis  or Xarelto.   Per 07/12/24 OV note with Delon Hoover it states:  ASSESSMENT AND PLAN Persistent atrial fibrillation/hypercoagulable state/on amiodarone  therapy-CHA2DS2-VASc score is 4, his rate is controlled. Continue amiodarone  200 mg daily, continue Pradaxa  150 mg twice daily-- denies hematochezia, hematuria, or hemoptysis -- no missed doses.  Continue metoprolol  200 mg daily.  Discussed with DOD, will have him come back in 2 weeks for repeat EKG to give amiodarone  time to work, if he has not converted will plan with DCCV. Refer to EP as well to discuss ablation as he does not want to stay on medications long term.

## 2024-07-16 ENCOUNTER — Ambulatory Visit: Payer: Self-pay | Attending: Cardiology

## 2024-07-16 VITALS — BP 138/66 | HR 81

## 2024-07-16 DIAGNOSIS — I10 Essential (primary) hypertension: Secondary | ICD-10-CM

## 2024-07-16 DIAGNOSIS — Z01812 Encounter for preprocedural laboratory examination: Secondary | ICD-10-CM

## 2024-07-16 DIAGNOSIS — I4891 Unspecified atrial fibrillation: Secondary | ICD-10-CM

## 2024-07-16 DIAGNOSIS — I739 Peripheral vascular disease, unspecified: Secondary | ICD-10-CM

## 2024-07-16 MED ORDER — FUROSEMIDE 20 MG PO TABS: 20.0000 mg | ORAL_TABLET | Freq: Every day | ORAL | 0 refills | Status: DC

## 2024-07-16 MED ORDER — METOPROLOL SUCCINATE ER 200 MG PO TB24: 200.0000 mg | ORAL_TABLET | Freq: Every day | ORAL | 0 refills | Status: DC

## 2024-07-16 MED ORDER — SPIRONOLACTONE 25 MG PO TABS: 25.0000 mg | ORAL_TABLET | Freq: Every day | ORAL | 0 refills | Status: DC

## 2024-07-16 MED ORDER — LOSARTAN POTASSIUM 25 MG PO TABS: 25.0000 mg | ORAL_TABLET | Freq: Every day | ORAL | 0 refills | Status: DC

## 2024-07-16 MED ORDER — ATORVASTATIN CALCIUM 40 MG PO TABS: 40.0000 mg | ORAL_TABLET | Freq: Every day | ORAL | 1 refills | Status: AC

## 2024-07-16 NOTE — Patient Instructions (Signed)
   Dear Cody Baird  You are scheduled for a Cardioversion on Wednesday, September 10 with Dr. Raford.  Please arrive at the [facility] (Main Entrance A) at [facility]: [address] [place], [place] [address] at 9:30 AM (This time is 1 hour(s) before your procedure to ensure your preparation). Your procedure is scheduled to begin at 10:30 AM.  Free valet parking service is available. You will check in at ADMITTING.   *Please Note: You will receive a call the day before your procedure to confirm the appointment time. That time may have changed from the original time based on the schedule for that day.*   DIET:  Nothing to eat or drink after midnight except a sip of water with medications (see medication instructions below)  MEDICATION INSTRUCTIONS: !!IF ANY NEW MEDICATIONS ARE STARTED AFTER TODAY, PLEASE NOTIFY YOUR PROVIDER AS SOON AS POSSIBLE!!  FYI: Medications such as Semaglutide (Ozempic, [place]), Tirzepatide (Mounjaro, Zepbound), Dulaglutide (Trulicity), etc (GLP1 agonists) AND Canagliflozin (Invokana), Dapagliflozin (Farxiga), Empagliflozin (Jardiance), Ertugliflozin (Steglatro), Bexagliflozin [employer]) or any combination with one of these drugs such as Invokamet (Canagliflozin/Metformin), Synjardy (Empagliflozin/Metformin), etc (SGLT2 inhibitors) must be held around the time of a procedure. This is not a comprehensive list of all of these drugs. Please review all of your medications and talk to your provider if you take any one of these. If you are not sure, ask your provider.  Continue taking your anticoagulant (blood thinner): Dabigatran  (Pradaxa ).  You will need to continue this after your procedure until you are told by your provider that it is safe to stop.    LABS:   Come to [facility] at [facility] on Wednesday 07/17/2024 for a BMP and CBC to be drawn.  FYI:  For your safety, and to allow us  to monitor your vital signs accurately during the surgery/procedure we  request: If you have artificial nails, gel coating, SNS etc, please have those removed prior to your surgery/procedure. Not having the nail coverings /polish removed may result in cancellation or delay of your surgery/procedure.  Your support person will be asked to wait in the waiting room during your procedure.  It is OK to have someone drop you off and come back when you are ready to be discharged.  You cannot drive after the procedure and will need someone to drive you home.  Bring your insurance cards.  *Special Note: Every effort is made to have your procedure done on time. Occasionally there are emergencies that occur at the hospital that may cause delays. Please be patient if a delay does occur.

## 2024-07-17 ENCOUNTER — Telehealth: Payer: Self-pay | Admitting: Cardiology

## 2024-07-17 MED ORDER — SPIRONOLACTONE 25 MG PO TABS: 25.0000 mg | ORAL_TABLET | Freq: Every day | ORAL | 3 refills | Status: DC

## 2024-07-17 MED ORDER — LOSARTAN POTASSIUM 25 MG PO TABS: 25.0000 mg | ORAL_TABLET | Freq: Every day | ORAL | 3 refills | Status: DC

## 2024-07-17 MED ORDER — FUROSEMIDE 20 MG PO TABS: 20.0000 mg | ORAL_TABLET | Freq: Every day | ORAL | 3 refills | Status: DC

## 2024-07-17 MED ORDER — METOPROLOL SUCCINATE ER 200 MG PO TB24: 200.0000 mg | ORAL_TABLET | Freq: Every day | ORAL | 3 refills | Status: DC

## 2024-07-17 NOTE — Telephone Encounter (Signed)
*  STAT* If patient is at the pharmacy, call can be transferred to refill team.   1. Which medications need to be refilled? (please list name of each medication and dose if known)   furosemide  (LASIX ) 20 MG tablet  losartan  (COZAAR ) 25 MG tablet  metoprolol  (TOPROL -XL) 200 MG 24 hr tablet  spironolactone  (ALDACTONE ) 25 MG tablet   2. Would you like to learn more about the convenience, safety, & potential cost savings by using the [facility]?   3. Are you open to using the [facility] (Type [facility]. ).   4. Which pharmacy/location (including street and city if local pharmacy) is medication to be sent to?  [facility] - [place], [place] - [address]   5. Do they need a 30 day or 90 day supply?   Patient stated he is almost out of these medications.

## 2024-07-17 NOTE — Telephone Encounter (Signed)
 Pt's medications were sent to pt's pharmacy as requested. Confirmation received.

## 2024-07-17 NOTE — Telephone Encounter (Signed)
 Patient wants a call back regarding instructions for his procedure next week.

## 2024-07-17 NOTE — Addendum Note (Signed)
 Addended by: CARLIN DELON BROCKS on: 07/17/2024 11:31 AM   Modules accepted: Orders

## 2024-07-19 ENCOUNTER — Other Ambulatory Visit: Payer: Self-pay

## 2024-07-19 NOTE — Telephone Encounter (Signed)
 Called the patient and he stated that he was scheduled for a cardioversion next week and he did not know when it was scheduled and no one had reviewed the instructions for the test with him. Reviewed the chart and found the instructions. The instructions were reviewed with the patient and he had no further questions at this time. A copy of the instructions were left at the front office for the patient to pick up when he came for his blood draw on Monday.

## 2024-07-23 ENCOUNTER — Ambulatory Visit: Payer: Self-pay | Admitting: Cardiology

## 2024-07-23 LAB — BASIC METABOLIC PANEL WITH GFR
BUN/Creatinine Ratio: 12 (ref 10–24)
BUN: 13 mg/dL (ref 8–27)
CO2: 22 mmol/L (ref 20–29)
Calcium: 9.8 mg/dL (ref 8.6–10.2)
Chloride: 102 mmol/L (ref 96–106)
Creatinine, Ser: 1.09 mg/dL (ref 0.76–1.27)
Glucose: 92 mg/dL (ref 70–99)
Potassium: 4.3 mmol/L (ref 3.5–5.2)
Sodium: 139 mmol/L (ref 134–144)
eGFR: 77 mL/min/1.73

## 2024-07-23 LAB — CBC
Hematocrit: 54.2 % — ABNORMAL HIGH (ref 37.5–51.0)
Hemoglobin: 17.2 g/dL (ref 13.0–17.7)
MCH: 29.9 pg (ref 26.6–33.0)
MCHC: 31.7 g/dL (ref 31.5–35.7)
MCV: 94 fL (ref 79–97)
Platelets: 159 x10E3/uL (ref 150–450)
RBC: 5.76 x10E6/uL (ref 4.14–5.80)
RDW: 12.9 % (ref 11.6–15.4)
WBC: 7 x10E3/uL (ref 3.4–10.8)

## 2024-07-23 NOTE — Progress Notes (Signed)
 Spoke to patient and instructed them to come at 0930  and to be NPO after 0000.     Confirmed that patient will have a ride home and someone to stay with them for 24 hours after the procedure.   Medications reviewed.  Confirmed blood thinner.  Confirmed no breaks in taking blood thinner for 3+ weeks prior to procedure. Confirmed patient stopped all GLP-1s and GLP-2s for at least one week before procedure.

## 2024-07-24 ENCOUNTER — Ambulatory Visit (HOSPITAL_COMMUNITY): Payer: Self-pay | Admitting: Anesthesiology

## 2024-07-24 ENCOUNTER — Encounter (HOSPITAL_COMMUNITY)
Admission: RE | Disposition: A | Discharge status: Home / Self Care | Payer: Self-pay | Source: Home / Self Care | Attending: Cardiovascular Disease

## 2024-07-24 ENCOUNTER — Ambulatory Visit (HOSPITAL_COMMUNITY)
Admission: RE | Admit: 2024-07-24 | Discharge: 2024-07-24 | Disposition: A | Discharge status: Home / Self Care | Payer: Self-pay | Attending: Cardiovascular Disease | Admitting: Cardiovascular Disease

## 2024-07-24 ENCOUNTER — Other Ambulatory Visit: Payer: Self-pay

## 2024-07-24 ENCOUNTER — Encounter (HOSPITAL_COMMUNITY): Payer: Self-pay | Admitting: Cardiovascular Disease

## 2024-07-24 DIAGNOSIS — J449 Chronic obstructive pulmonary disease, unspecified: Secondary | ICD-10-CM

## 2024-07-24 DIAGNOSIS — Z7982 Long term (current) use of aspirin: Secondary | ICD-10-CM | POA: Insufficient documentation

## 2024-07-24 DIAGNOSIS — I4891 Unspecified atrial fibrillation: Secondary | ICD-10-CM

## 2024-07-24 DIAGNOSIS — Z7901 Long term (current) use of anticoagulants: Secondary | ICD-10-CM | POA: Insufficient documentation

## 2024-07-24 DIAGNOSIS — I110 Hypertensive heart disease with heart failure: Secondary | ICD-10-CM | POA: Insufficient documentation

## 2024-07-24 DIAGNOSIS — Z8673 Personal history of transient ischemic attack (TIA), and cerebral infarction without residual deficits: Secondary | ICD-10-CM | POA: Insufficient documentation

## 2024-07-24 DIAGNOSIS — Z79899 Other long term (current) drug therapy: Secondary | ICD-10-CM | POA: Insufficient documentation

## 2024-07-24 DIAGNOSIS — Z87891 Personal history of nicotine dependence: Secondary | ICD-10-CM | POA: Insufficient documentation

## 2024-07-24 DIAGNOSIS — I4819 Other persistent atrial fibrillation: Secondary | ICD-10-CM | POA: Insufficient documentation

## 2024-07-24 DIAGNOSIS — I5022 Chronic systolic (congestive) heart failure: Secondary | ICD-10-CM | POA: Insufficient documentation

## 2024-07-24 DIAGNOSIS — I739 Peripheral vascular disease, unspecified: Secondary | ICD-10-CM | POA: Insufficient documentation

## 2024-07-24 DIAGNOSIS — I10 Essential (primary) hypertension: Secondary | ICD-10-CM

## 2024-07-24 HISTORY — PX: CARDIOVERSION: EP1203

## 2024-07-24 SURGERY — CARDIOVERSION (CATH LAB)
Anesthesia: General

## 2024-07-24 MED ORDER — PROPOFOL 10 MG/ML IV BOLUS
INTRAVENOUS | Status: DC | PRN
  Administered 2024-07-24: 50 mg via INTRAVENOUS
  Administered 2024-07-24 (×3): 30 mg via INTRAVENOUS

## 2024-07-24 SURGICAL SUPPLY — 1 items: PAD DEFIB RADIO PHYSIO CONN (PAD) ×1 IMPLANT

## 2024-07-24 NOTE — Interval H&P Note (Signed)
 History and Physical Interval Note:  07/24/2024 10:31 AM  Cody Baird  has presented today for surgery, with the diagnosis of AFIB.  The various methods of treatment have been discussed with the patient and family. After consideration of risks, benefits and other options for treatment, the patient has consented to  Procedure(s): CARDIOVERSION (N/A) as a surgical intervention.  The patient's history has been reviewed, patient examined, no change in status, stable for surgery.  I have reviewed the patient's chart and labs.  Questions were answered to the patient's satisfaction.     Annabella Scarce, MD

## 2024-07-24 NOTE — Transfer of Care (Signed)
 Immediate Anesthesia Transfer of Care Note  Patient: Cody Baird  Procedure(s) Performed: CARDIOVERSION  Patient Location: Cath Lab  Anesthesia Type:General  Level of Consciousness: awake, alert , and oriented  Airway & Oxygen Therapy: Patient Spontanous Breathing and Patient connected to nasal cannula oxygen  Post-op Assessment: Report given to RN and Post -op Vital signs reviewed and stable  Post vital signs: Reviewed and stable  Last Vitals:  Vitals Value Taken Time  BP 106/93 1046  Temp 91 1046  Pulse 57 1046  Resp 20 1046  SpO2 90     Last Pain:  Vitals:   07/24/24 0954  TempSrc:   PainSc: 0-No pain         Complications: No notable events documented.

## 2024-07-24 NOTE — CV Procedure (Signed)
 Electrical Cardioversion Procedure Note Cody Baird [id number] [birth year]  Procedure: Electrical Cardioversion Indications:  Atrial Fibrillation  Procedure Details Consent: Risks of procedure as well as the alternatives and risks of each were explained to the (patient/caregiver).  Consent for procedure obtained. Time Out: Verified patient identification, verified procedure, site/side was marked, verified correct patient position, special equipment/implants available, medications/allergies/relevent history reviewed, required imaging and test results available.  Performed  Patient placed on cardiac monitor, pulse oximetry, supplemental oxygen as necessary.  Sedation given: propofol  Pacer pads placed anterior and posterior chest.  Cardioverted 2 time(s).  Cardioverted at 300J unsuccessful.  360J with pressure successful.  Evaluation Findings: Post procedure EKG shows: NSR Complications: None Patient did tolerate procedure well.   Annabella Scarce, MD 07/24/2024, 10:51 AM

## 2024-07-24 NOTE — Anesthesia Preprocedure Evaluation (Signed)
 Anesthesia Evaluation  Patient identified by MRN, date of birth, ID band Patient awake    Reviewed: Allergy & Precautions, NPO status , Patient's Chart, lab work & pertinent test results  History of Anesthesia Complications Negative for: history of anesthetic complications  Airway Mallampati: III  TM Distance: <3 FB Neck ROM: Full    Dental  (+) Dental Advisory Given, Teeth Intact   Pulmonary shortness of breath, neg sleep apnea, neg COPD, neg recent URI, former smoker   breath sounds clear to auscultation       Cardiovascular hypertension, Pt. on medications (-) angina + Peripheral Vascular Disease  + dysrhythmias  Rhythm:Irregular   1. Left ventricular ejection fraction, by estimation, is 35 to 40%. The  left ventricle has moderately decreased function.   2. Right ventricular systolic function is mildly reduced. The right  ventricular size is normal.   3. Left atrial size was mildly dilated. No left atrial/left atrial  appendage thrombus was detected.   4. A small pericardial effusion is present. The pericardial effusion is  circumferential.   5. The mitral valve is normal in structure. Mild mitral valve  regurgitation.   6. The aortic valve is tricuspid. Aortic valve regurgitation is not  visualized.      Neuro/Psych neg Seizures TIACVA, Residual Symptoms    GI/Hepatic negative GI ROS, Neg liver ROS,,,  Endo/Other    Renal/GU negative Renal ROS     Musculoskeletal  (+) Arthritis ,    Abdominal   Peds  Hematology  (+) Blood dyscrasia Lab Results      Component                Value               Date                      WBC                      7.0                 07/22/2024                HGB                      17.2                07/22/2024                HCT                      54.2 (H)            07/22/2024                MCV                      94                  07/22/2024                PLT                       159                 07/22/2024             pradaxa    Anesthesia Other Findings  Reproductive/Obstetrics                              Anesthesia Physical Anesthesia Plan  ASA: 3  Anesthesia Plan: General   Post-op Pain Management:    Induction: Intravenous  PONV Risk Score and Plan: 2 and Treatment may vary due to age or medical condition  Airway Management Planned: Nasal Cannula, Natural Airway and Simple Face Mask  Additional Equipment: None  Intra-op Plan:   Post-operative Plan:   Informed Consent: I have reviewed the patients History and Physical, chart, labs and discussed the procedure including the risks, benefits and alternatives for the proposed anesthesia with the patient or authorized representative who has indicated his/her understanding and acceptance.     Dental advisory given  Plan Discussed with: CRNA  Anesthesia Plan Comments:          Anesthesia Quick Evaluation

## 2024-07-30 NOTE — Anesthesia Postprocedure Evaluation (Signed)
 Anesthesia Post Note  Patient: Cody Baird  Procedure(s) Performed: CARDIOVERSION     Patient location during evaluation: Cath Lab Anesthesia Type: General Level of consciousness: awake and alert Pain management: pain level controlled Vital Signs Assessment: post-procedure vital signs reviewed and stable Respiratory status: spontaneous breathing, nonlabored ventilation and respiratory function stable Cardiovascular status: blood pressure returned to baseline and stable Postop Assessment: no apparent nausea or vomiting Anesthetic complications: no   There were no known notable events for this encounter.                 Lynk Marti

## 2024-08-01 NOTE — Progress Notes (Unsigned)
 Cardiology Office Note:    Date:  08/05/2024   ID:  Cody Baird, DOB 1961/02/01, MRN [id number]  PCP:  Delbert Clam, MD  Cardiologist:  Redell Leiter, MD    Referring MD: Delbert Clam, MD    ASSESSMENT:    1. PAF (paroxysmal atrial fibrillation) (HCC)   2. On amiodarone  therapy   3. Chronic anticoagulation   4. Essential hypertension   5. Mixed hyperlipidemia    PLAN:    In order of problems listed above:  Fortunately maintaining sinus rhythm low-dose amiodarone  continue the same reduce his beta-blocker I suspect his exertional shortness of breath is chronotropic incompetence and continue anticoagulant therapy stopping aspirin  Safety check CMP thyroid test With cardiomyopathy check proBNP level if remains elevated will need to intensify therapy for cardiomyopathy transitioning from losartan  to Entresto Continue his current lipid-lowering treatment   Next appointment: 3 months   Medication Adjustments/Labs and Tests Ordered: Current medicines are reviewed at length with the patient today.  Concerns regarding medicines are outlined above.  Orders Placed This Encounter  Procedures   EKG 12-Lead   No orders of the defined types were placed in this encounter.    History of Present Illness:    Cody Baird is a 63 y.o. male with a hx of peripheral vascular disease hypertension hyperlipidemia and stroke last seen 01/10/2024.  He subsequently presented to the hospital in August with atrial fibrillation rapid ventricular response his ejection fraction was reduced in the range of 35 to 40% he failed TEE cardioversion initially subsequently placed on amiodarone  and underwent cardioversion successfully to sinus rhythm 07/24/2024.  Compliance with diet, lifestyle and medications: Yes  He feels improved but is still short of breath when he does activities like walking uphill.  His heart rate is in the 50s I suspect much of this is chronotropic incompetence and Morgan's use  decrease his beta-blocker dosage to Toprol -XL 25 mg daily wait 2 weeks do an event monitor and assess his response.  If unimproved will need to intensify therapy for his cardiomyopathy.  He is not having orthopnea chest pain palpitation syncope or cough.  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   IMO SNOMED Dx Update Oct 2024     Atherosclerotic peripheral vascular disease with rest pain (HCC) 10/30/2013   Atrial fibrillation (HCC) 06/17/2024   Atrial fibrillation with RVR (HCC) 06/16/2024   B12 deficiency    Bradycardia    Chronic back pain    CVA (cerebral vascular accident) (HCC) 01/28/2020   Essential hypertension 01/28/2020   History of bronchitis    last time about 2-60yrs ago   History of stroke 06/16/2024   Hyperlipidemia 06/16/2024   Ischemic pain of foot 12/24/2013   PAD (peripheral artery disease)    Pain in limb 06/25/2013   PBA (pseudobulbar affect)    Peripheral vascular disease    Peripheral vascular disease, unspecified 06/25/2013   Post-op pain 09/17/2013   Post-operative pain 07/16/2013   Prolapsed, anus    hx of   PVD (peripheral vascular disease) 09/17/2013   Stroke (HCC) 01/2020   TIA (transient ischemic attack) 01/28/2020   Tobacco abuse 01/28/2020   Venous stasis ulcer of  ankle, right (HCC) 02/17/2020    Current Medications: Current Meds  Medication Sig   albuterol  (VENTOLIN  HFA) 108 (90 Base) MCG/ACT inhaler Inhale 2 puffs into the lungs every 6 (six) hours as needed for wheezing or shortness of breath.   amiodarone  (PACERONE ) 200 MG tablet Take 1 tablet (200 mg total) by mouth daily for 7 days.   aspirin  EC 81 MG tablet Take 1 tablet (81 mg total) by  mouth daily. Swallow whole.   atorvastatin  (LIPITOR) 40 MG tablet Take 1 tablet (40 mg total) by mouth daily.   dabigatran  (PRADAXA ) 150 MG CAPS capsule Take 1 capsule (150 mg total) by mouth every 12 (twelve) hours.   diphenhydrAMINE  (BENADRYL ) 25 MG tablet Take 25-50 mg by mouth every 6 (six) hours as needed for itching or allergies.   furosemide  (LASIX ) 20 MG tablet Take 1 tablet (20 mg total) by mouth daily.   losartan  (COZAAR ) 25 MG tablet Take 1 tablet (25 mg total) by mouth daily.   metoprolol  (TOPROL -XL) 200 MG 24 hr tablet Take 1 tablet (200 mg total) by mouth daily. Take with or immediately following a meal.   spironolactone  (ALDACTONE ) 25 MG tablet Take 1 tablet (25 mg total) by mouth daily.      EKGs/Labs/Other Studies Reviewed:    The following studies were reviewed today:  Cardiac Studies & Procedures   ______________________________________________________________________________________________     ECHOCARDIOGRAM  ECHOCARDIOGRAM COMPLETE 06/17/2024  Narrative ECHOCARDIOGRAM REPORT    Patient Name:   Cody Baird Date of Exam: 06/17/2024 Medical Rec #:  [id number]    Height:       72.0 in Accession #:    [id number]   Weight:       252.0 lb Date of Birth:  05-13-1961    BSA:          2.350 m Patient Age:    63 years     BP:           129/93 mmHg Patient Gender: M            HR:           99 bpm. Exam Location:  Inpatient  Procedure: 2D Echo, Cardiac Doppler, Color Doppler and Intracardiac Opacification Agent (Both Spectral and Color Flow Doppler were utilized during procedure).  Indications:    A-fib  History:        Patient has prior history of Echocardiogram examinations. Arrythmias:Atrial Fibrillation; Risk Factors:Hypertension.  Sonographer:    Vella Key Referring Phys: [id number] VISHAL R PATEL  IMPRESSIONS   1. Left ventricular ejection fraction is variable due to afib, 40-50%. The left ventricle has mildly decreased function. The left ventricle  demonstrates global hypokinesis. The left ventricular internal cavity size was mildly dilated. Left ventricular diastolic function could not be evaluated. 2. Right ventricular systolic function is mildly reduced. The right ventricular size is normal. Tricuspid regurgitation signal is inadequate for assessing PA pressure. 3. Left atrial size was mildly dilated. 4. The mitral valve is normal in structure. No evidence of mitral valve regurgitation. No evidence of mitral stenosis. 5. The aortic valve is normal in structure. Aortic valve regurgitation is not visualized. No aortic stenosis is present. 6. The inferior vena cava is normal in size with greater than 50% respiratory variability, suggesting right atrial pressure of 3 mmHg.  FINDINGS Left Ventricle: Left ventricular ejection fraction is variable due to afib, 40-50%. The left ventricle has mildly decreased function. The left ventricle demonstrates global hypokinesis. The left ventricular internal  cavity size was mildly dilated. There is no left ventricular hypertrophy. Left ventricular diastolic function could not be evaluated due to atrial fibrillation. Left ventricular diastolic function could not be evaluated.  Right Ventricle: The right ventricular size is normal. No increase in right ventricular wall thickness. Right ventricular systolic function is mildly reduced. Tricuspid regurgitation signal is inadequate for assessing PA pressure.  Left Atrium: Left atrial size was mildly dilated.  Right Atrium: Right atrial size was normal in size.  Pericardium: There is no evidence of pericardial effusion.  Mitral Valve: The mitral valve is normal in structure. No evidence of mitral valve regurgitation. No evidence of mitral valve stenosis.  Tricuspid Valve: The tricuspid valve is normal in structure. Tricuspid valve regurgitation is trivial. No evidence of tricuspid stenosis.  Aortic Valve: The aortic valve is normal in structure. Aortic valve  regurgitation is not visualized. No aortic stenosis is present.  Pulmonic Valve: The pulmonic valve was normal in structure. Pulmonic valve regurgitation is not visualized. No evidence of pulmonic stenosis.  Aorta: The aortic root is normal in size and structure.  Venous: The inferior vena cava is normal in size with greater than 50% respiratory variability, suggesting right atrial pressure of 3 mmHg.  IAS/Shunts: No atrial level shunt detected by color flow Doppler.   LEFT VENTRICLE PLAX 2D LVIDd:         6.00 cm LVIDs:         6.00 cm LV PW:         1.30 cm LV IVS:        1.30 cm LVOT diam:     2.10 cm LV SV:         45 LV SV Index:   19 LVOT Area:     3.46 cm  LV Volumes (MOD) LV vol d, MOD A2C: 166.0 ml LV vol d, MOD A4C: 178.0 ml LV vol s, MOD A2C: 73.2 ml LV vol s, MOD A4C: 91.2 ml LV SV MOD A2C:     92.8 ml LV SV MOD A4C:     178.0 ml LV SV MOD BP:      93.1 ml  RIGHT VENTRICLE RV Basal diam:  2.90 cm RV S prime:     8.49 cm/s TAPSE (M-mode): 1.5 cm  LEFT ATRIUM             Index        RIGHT ATRIUM           Index LA diam:        3.90 cm 1.66 cm/m   RA Area:     25.40 cm LA Vol (A2C):   95.4 ml 40.59 ml/m  RA Volume:   82.60 ml  35.14 ml/m LA Vol (A4C):   44.5 ml 18.93 ml/m LA Biplane Vol: 70.9 ml 30.17 ml/m AORTIC VALVE LVOT Vmax:   81.40 cm/s LVOT Vmean:  56.900 cm/s LVOT VTI:    0.130 m  AORTA Ao Root diam: 4.20 cm Ao Asc diam:  3.50 cm  MITRAL VALVE MV Area (PHT): 5.70 cm    SHUNTS MV Decel Time: 133 msec    Systemic VTI:  0.13 m MV E velocity: 85.90 cm/s  Systemic Diam: 2.10 cm MV A velocity: 32.00 cm/s MV E/A ratio:  2.68  Morene Brownie Electronically signed by Morene Brownie Signature Date/Time: 06/17/2024/1:13:54 PM    Final   TEE  ECHO TEE 06/18/2024  Narrative TRANSESOPHOGEAL ECHO REPORT    Patient Name:   KARMELLO A Eckels  Date of Exam: 06/18/2024 Medical Rec #:  [id number]    Height:       72.0 in Accession #:     [id number]   Weight:       250.9 lb Date of Birth:  Oct 10, 1961    BSA:          2.346 m Patient Age:    63 years     BP:           127/66 mmHg Patient Gender: M            HR:           109 bpm. Exam Location:  Inpatient  Procedure: Transesophageal Echo, Cardiac Doppler and Color Doppler (Both Spectral and Color Flow Doppler were utilized during procedure).  Indications:     I48.91* Unspecified atrial fibrillation  History:         Patient has prior history of Echocardiogram examinations, most recent 06/17/2024. PAD, Arrythmias:Atrial Fibrillation; Risk Factors:Hypertension and Dyslipidemia.  Sonographer:     Damien Senior RDCS Referring Phys:  [id number] SHENG L HALEY Diagnosing Phys: Kardie Tobb DO  PROCEDURE: After discussion of the risks and benefits of a TEE, an informed consent was obtained from the patient. The transesophogeal probe was passed without difficulty through the esophogus of the patient. Sedation performed by different physician. The patient was monitored while under deep sedation. Anesthestetic sedation was provided intravenously by Anesthesiology: 314mg  of Propofol , 100mg  of Lidocaine . The patient developed no complications during the procedure. An unsuccessful direct current cardioversion was performed at 360 joules with 3 attempts.  IMPRESSIONS   1. Left ventricular ejection fraction, by estimation, is 35 to 40%. The left ventricle has moderately decreased function. 2. Right ventricular systolic function is mildly reduced. The right ventricular size is normal. 3. Left atrial size was mildly dilated. No left atrial/left atrial appendage thrombus was detected. 4. A small pericardial effusion is present. The pericardial effusion is circumferential. 5. The mitral valve is normal in structure. Mild mitral valve regurgitation. 6. The aortic valve is tricuspid. Aortic valve regurgitation is not visualized.  FINDINGS Left Ventricle: Left ventricular ejection fraction, by  estimation, is 35 to 40%. The left ventricle has moderately decreased function. The left ventricular internal cavity size was normal in size.  Right Ventricle: The right ventricular size is normal. No increase in right ventricular wall thickness. Right ventricular systolic function is mildly reduced.  Left Atrium: Left atrial size was mildly dilated. No left atrial/left atrial appendage thrombus was detected.  Right Atrium: Right atrial size was normal in size.  Pericardium: A small pericardial effusion is present. The pericardial effusion is circumferential.  Mitral Valve: The mitral valve is normal in structure. Mild mitral valve regurgitation.  Tricuspid Valve: The tricuspid valve is normal in structure. Tricuspid valve regurgitation is trivial.  Aortic Valve: The aortic valve is tricuspid. Aortic valve regurgitation is not visualized.  Pulmonic Valve: The pulmonic valve was normal in structure. Pulmonic valve regurgitation is trivial.  Aorta: The aortic root and ascending aorta are structurally normal, with no evidence of dilitation.  Venous: The left upper pulmonary vein, left lower pulmonary vein, right upper pulmonary vein and right lower pulmonary vein are normal.  IAS/Shunts: No atrial level shunt detected by color flow Doppler.  Additional Comments: Spectral Doppler performed.   AORTA Ao Asc diam: 3.50 cm  Kardie Tobb DO Electronically signed by Dub Huntsman DO Signature Date/Time: 06/18/2024/2:05:44 PM    Final  MONITORS  LONG TERM MONITOR (3-14 DAYS)  03/11/2020  Narrative ZIO monitor was performed 13 days 23 h beginning 03/10/2020 to assess for atrial fibrillation in the setting of stroke and suspected embolic etiology.  The predominant rhythm was sinus with average minimum of maximum rates of 63, 37 112 bpm.  The bradycardia was nocturnal sinus bradycardia.  There were no pauses of 3 s or greater and no episodes of 2nd or third-degree AV nodal block or sinus  node exit block.  There were no triggered or diary events.  Ventricular ectopy was rare.  Supraventricular ectopy was rare without atrial fibrillation or flutter.  There were 37 brief runs of atrial premature contractions the longest 11.6 s rate of 121 bpm and the morphology was paroxysmal atrial tachycardia.   Conclusion overall rare ventricular and supraventricular ectopy with brief episodes of atrial tachycardia.  Atrial fibrillation flutter is not documented.       ______________________________________________________________________________________________          Recent Labs: 12/05/2023: ALT 24 06/16/2024: B Natriuretic Peptide 257.2; TSH 2.162 06/18/2024: Magnesium 2.1 07/22/2024: BUN 13; Creatinine, Ser 1.09; Hemoglobin 17.2; Platelets 159; Potassium 4.3; Sodium 139  Recent Lipid Panel    Component Value Date/Time   CHOL 119 01/10/2024 1018   TRIG 109 01/10/2024 1018   HDL 44 01/10/2024 1018   CHOLHDL 2.7 01/10/2024 1018   CHOLHDL 6.6 01/28/2020 0612   VLDL 31 01/28/2020 0612   LDLCALC 55 01/10/2024 1018    Physical Exam:    VS:  BP 92/60   Pulse (!) 56   Ht 6' (1.829 m)   Wt 252 lb 6.4 oz (114.5 kg)   SpO2 95%   BMI 34.23 kg/m     Wt Readings from Last 3 Encounters:  08/05/24 252 lb 6.4 oz (114.5 kg)  07/24/24 248 lb (112.5 kg)  07/05/24 248 lb 12.8 oz (112.9 kg)     GEN:  Well nourished, well developed in no acute distress HEENT: Normal NECK: No JVD; No carotid bruits LYMPHATICS: No lymphadenopathy CARDIAC: RRR, no murmurs, rubs, gallops RESPIRATORY:  Clear to auscultation without rales, wheezing or rhonchi  ABDOMEN: Soft, non-tender, non-distended MUSCULOSKELETAL:  No edema; No deformity  SKIN: Warm and dry NEUROLOGIC:  Alert and oriented x 3 PSYCHIATRIC:  Normal affect    Signed, Redell Leiter, MD  08/05/2024 4:05 PM    [facility]

## 2024-08-05 ENCOUNTER — Ambulatory Visit: Payer: Self-pay | Attending: Cardiology | Admitting: Cardiology

## 2024-08-05 ENCOUNTER — Encounter: Payer: Self-pay | Admitting: Cardiology

## 2024-08-05 VITALS — BP 92/60 | HR 56 | Ht 72.0 in | Wt 252.4 lb

## 2024-08-05 DIAGNOSIS — I480 Paroxysmal atrial fibrillation: Secondary | ICD-10-CM

## 2024-08-05 DIAGNOSIS — Z79899 Other long term (current) drug therapy: Secondary | ICD-10-CM

## 2024-08-05 DIAGNOSIS — I10 Essential (primary) hypertension: Secondary | ICD-10-CM

## 2024-08-05 DIAGNOSIS — Z7901 Long term (current) use of anticoagulants: Secondary | ICD-10-CM

## 2024-08-05 DIAGNOSIS — E782 Mixed hyperlipidemia: Secondary | ICD-10-CM

## 2024-08-05 MED ORDER — METOPROLOL SUCCINATE ER 25 MG PO TB24: 25.0000 mg | ORAL_TABLET | Freq: Every day | ORAL | 3 refills | Status: DC

## 2024-08-05 NOTE — Patient Instructions (Signed)
 Medication Instructions:  Your physician has recommended you make the following change in your medication:   STOP: Aspirin  START: Metoprolol  succinate 25 mg daily  *If you need a refill on your cardiac medications before your next appointment, please call your pharmacy*  Lab Work: Your physician recommends that you return for lab work in:   Labs today: CMP, CBC, TSH T3 T4, Pro BNP  If you have labs (blood work) drawn today and your tests are completely normal, you will receive your results only by: MyChart Message (if you have MyChart) OR A paper copy in the mail If you have any lab test that is abnormal or we need to change your treatment, we will call you to review the results.  Testing/Procedures: None  Follow-Up: At [facility], you and your health needs are our priority.  As part of our continuing mission to provide you with exceptional heart care, our providers are all part of one team.  This team includes your primary Cardiologist (physician) and Advanced Practice Providers or APPs (Physician Assistants and Nurse Practitioners) who all work together to provide you with the care you need, when you need it.  Your next appointment:   3 month(s)  Provider:   Redell Leiter, MD    We recommend signing up for the patient portal called MyChart.  Sign up information is provided on this After Visit Summary.  MyChart is used to connect with patients for Virtual Visits (Telemedicine).  Patients are able to view lab/test results, encounter notes, upcoming appointments, etc.  Non-urgent messages can be sent to your provider as well.   To learn more about what you can do with MyChart, go to [email or web address].   Other Instructions None

## 2024-08-05 NOTE — Addendum Note (Signed)
 Addended by: SHERRE ADE I on: 08/05/2024 04:26 PM   Modules accepted: Orders

## 2024-08-06 ENCOUNTER — Ambulatory Visit: Payer: Self-pay | Admitting: Cardiology

## 2024-08-06 LAB — TSH+T4F+T3FREE
Free T4: 1.22 ng/dL (ref 0.82–1.77)
T3, Free: 3 pg/mL (ref 2.0–4.4)
TSH: 1.21 u[IU]/mL (ref 0.450–4.500)

## 2024-08-06 LAB — PRO B NATRIURETIC PEPTIDE: NT-Pro BNP: 228 pg/mL — ABNORMAL HIGH (ref 0–210)

## 2024-08-06 LAB — COMPREHENSIVE METABOLIC PANEL WITH GFR
ALT: 31 IU/L (ref 0–44)
AST: 17 IU/L (ref 0–40)
Albumin: 4.6 g/dL (ref 3.9–4.9)
Alkaline Phosphatase: 77 IU/L (ref 47–123)
BUN/Creatinine Ratio: 15 (ref 10–24)
BUN: 16 mg/dL (ref 8–27)
Bilirubin Total: 0.7 mg/dL (ref 0.0–1.2)
CO2: 23 mmol/L (ref 20–29)
Calcium: 9.8 mg/dL (ref 8.6–10.2)
Chloride: 99 mmol/L (ref 96–106)
Creatinine, Ser: 1.05 mg/dL (ref 0.76–1.27)
Globulin, Total: 3 g/dL (ref 1.5–4.5)
Glucose: 102 mg/dL — ABNORMAL HIGH (ref 70–99)
Potassium: 4.5 mmol/L (ref 3.5–5.2)
Sodium: 136 mmol/L (ref 134–144)
Total Protein: 7.6 g/dL (ref 6.0–8.5)
eGFR: 80 mL/min/1.73 (ref >59)

## 2024-08-06 LAB — CBC
Hematocrit: 52.7 % — ABNORMAL HIGH (ref 37.5–51.0)
Hemoglobin: 17.5 g/dL (ref 13.0–17.7)
MCH: 30.5 pg (ref 26.6–33.0)
MCHC: 33.2 g/dL (ref 31.5–35.7)
MCV: 92 fL (ref 79–97)
Platelets: 183 x10E3/uL (ref 150–450)
RBC: 5.73 x10E6/uL (ref 4.14–5.80)
RDW: 13 % (ref 11.6–15.4)
WBC: 8.3 x10E3/uL (ref 3.4–10.8)

## 2024-08-07 ENCOUNTER — Ambulatory Visit: Payer: Self-pay | Admitting: Family Medicine

## 2024-08-12 ENCOUNTER — Other Ambulatory Visit: Payer: Self-pay

## 2024-08-12 ENCOUNTER — Ambulatory Visit: Payer: Self-pay | Attending: Cardiology | Admitting: Cardiology

## 2024-08-12 ENCOUNTER — Telehealth: Payer: Self-pay

## 2024-08-12 ENCOUNTER — Encounter: Payer: Self-pay | Admitting: Cardiology

## 2024-08-12 VITALS — BP 126/74 | HR 80 | Ht 72.0 in | Wt 249.0 lb

## 2024-08-12 DIAGNOSIS — I5022 Chronic systolic (congestive) heart failure: Secondary | ICD-10-CM

## 2024-08-12 DIAGNOSIS — I10 Essential (primary) hypertension: Secondary | ICD-10-CM

## 2024-08-12 DIAGNOSIS — I4819 Other persistent atrial fibrillation: Secondary | ICD-10-CM

## 2024-08-12 DIAGNOSIS — D6869 Other thrombophilia: Secondary | ICD-10-CM

## 2024-08-12 MED ORDER — SACUBITRIL-VALSARTAN 24-26 MG PO TABS: 1.0000 | ORAL_TABLET | Freq: Two times a day (BID) | ORAL | 3 refills | Status: DC

## 2024-08-12 NOTE — Progress Notes (Signed)
  Electrophysiology Office Note:   Date:  08/12/2024  ID:  Cody Baird, DOB 1961-02-19, MRN [id number]  Primary Cardiologist: Redell Leiter, MD Primary Heart Failure: None Electrophysiologist: None      History of Present Illness:   Cody Baird is a 63 y.o. male with h/o atrial fibrillation, hypertension, hyperlipidemia, CVA, PAD, tobacco abuse seen today for  for Electrophysiology evaluation of atrial fibrillation at the request of Redell Leiter.    He presented to the hospital August 2025 with rapid atrial fibrillation.  Echo showed an ejection fraction of 35 to 40%.  He was started on amiodarone  and had cardioversion 07/24/2024.  He continues to be short of breath.  He had suspected chronotropic incompetence and metoprolol  was decreased.  When he was in atrial fibrillation, he had significant weakness, fatigue, shortness of breath.  He understands that amiodarone  is likely not a good long-term antiarrhythmic medication.  Unfortunately, he is not insured.  He is working with [employer] to [employer].  He would agree to procedures once he gets insurance.  Review of systems complete and found to be negative unless listed in HPI.   EP Information / Studies Reviewed:    EKG is not ordered today. EKG from 08/05/24 reviewed which showed sinus rhythm        Risk Assessment/Calculations:    CHA2DS2-VASc Score = 4   This indicates a 4.8% annual risk of stroke. The patient's score is based upon: CHF History: 0 HTN History: 1 Diabetes History: 0 Stroke History: 2 Vascular Disease History: 1 Age Score: 0 Gender Score: 0            Physical Exam:   VS:  BP 126/74 (BP Location: Left Arm, Patient Position: Sitting, Cuff Size: Large)   Pulse 80   Ht 6' (1.829 m)   Wt 249 lb (112.9 kg)   SpO2 96%   BMI 33.77 kg/m    Wt Readings from Last 3 Encounters:  08/12/24 249 lb (112.9 kg)  08/05/24 252 lb 6.4 oz (114.5 kg)  07/24/24 248 lb (112.5 kg)     GEN: Well nourished, well developed  in no acute distress NECK: No JVD; No carotid bruits CARDIAC: Regular rate and rhythm, no murmurs, rubs, gallops RESPIRATORY:  Clear to auscultation without rales, wheezing or rhonchi  ABDOMEN: Soft, non-tender, non-distended EXTREMITIES:  No edema; No deformity   ASSESSMENT AND PLAN:    1.  Persistent atrial fibrillation: On amiodarone .  He has maintained sinus rhythm since his cardioversion.  He feels improved with some mild shortness of breath.  He would prefer ablation to continued amiodarone , though he is not insured.  For now, would continue with the current management.  I would be happy to see him back once he gets insurance.  2.  Secondary hypercoagulable state: On Eliquis   3.  Chronic systolic heart failure: Ejection 35 to 40%.  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  4.  Hypertension: Controlled  Follow up with Dr. Inocencio pending insurance   Signed, Thomasene Dubow Gladis Inocencio, MD

## 2024-08-12 NOTE — Telephone Encounter (Signed)
 Pt requesting a c/b in regards to the Entresto

## 2024-08-12 NOTE — Telephone Encounter (Signed)
 Called the patient and he stated that his Entresto was going to cost him $1,000. This is a new medication for him and I would like to see if there was any grants or special programs that he may qualify for to help him afford his Entresto.

## 2024-08-13 ENCOUNTER — Other Ambulatory Visit (HOSPITAL_COMMUNITY): Payer: Self-pay

## 2024-08-13 ENCOUNTER — Telehealth: Payer: Self-pay | Admitting: Pharmacy Technician

## 2024-08-13 ENCOUNTER — Telehealth: Payer: Self-pay | Admitting: Licensed Clinical Social Worker

## 2024-08-13 NOTE — Telephone Encounter (Signed)
 Patient being mailed application - Cody Baird will mail it out

## 2024-08-13 NOTE — Progress Notes (Unsigned)
 Heart and Vascular Care Navigation  08/13/2024  Cody Baird 06/17/1961 [id number]  Reason for Referral: self pay   Engaged with patient by telephone for initial visit for Heart and Vascular Care Coordination.                                                                                                   Assessment:  LCSW was able to reach pt at [phone]. Confirmed home address, DOB and emergency contact remains his daughter. Pt resides alone. 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 Pt denies any issues with costs of housing, utilities, food, and has assistance for transportation through family. Pt shares he mostly is concerned about medication costs and cost of care as he moves forward. He is also interested in applying for disability.    LCSW reviewed information provided to [employer] Pt is currently over income for expansion Medicaid and does not currently receive any disability income. We discussed other options such as Haematologist and Marketplace coverage and medication assistance programs. Pt okay with these all being sent to home address. He shares his daughter will be happy to help him complete them. Encouraged him to have her call us  if needed.                                        HRT/VAS Care Coordination     Patients Home Cardiology Office [facility]   Outpatient Care Team Social Worker   Social Worker Name: Marit Lark, [place], [phone]   Living arrangements for the past 2 months Single Family Home   Lives with: Self   Home Assistive Devices/Equipment None   DME Agency NA       Social History:                                                                             SDOH Screenings   Food Insecurity: No Food Insecurity (08/13/2024)  Housing: Low Risk  (08/13/2024)  Transportation Needs: No Transportation Needs (08/13/2024)  Utilities: Not At Risk (08/13/2024)   Depression (PHQ2-9): Low Risk  (02/07/2024)  Recent Concern: Depression (PHQ2-9) - Medium Risk (12/13/2023)  Financial Resource Strain: Low Risk  (08/13/2024)  Social Connections: Socially Isolated (06/16/2024)  Tobacco Use: Medium Risk (08/12/2024)  Health Literacy: Adequate Health Literacy (08/13/2024)    SDOH Interventions: Financial Resources:  Financial Strain Interventions: Other (Comment) (sent [facility] financial assistance, marketplace flyer, and Entresto PAP) DSS for financial assistance, Editor, commissioning for [employer], and Social Security for Disability application assistance  Food Insecurity:  Food Insecurity Interventions: Intervention Not Indicated  Housing Insecurity:  Housing Interventions: Intervention Not Indicated  Transportation:   Transportation Interventions: Intervention Not Indicated, Patient Resources Dietitian)     Other Care Navigation Interventions:     Provided Pharmacy assistance resources  Mailed pt Entresto Patient Assistance form from [employer]   Follow-up plan:   LCSW sent pt information about [employer], Animal nutritionist, and information about how to apply for medication assistance through [employer] and Entresto patient assistance forms. Also mailed a copy of my card and information about applying for disability. Will f/u to ensure they are received, encouraged he or his daughter to call me as needed.

## 2024-08-14 ENCOUNTER — Telehealth: Payer: Self-pay | Admitting: Cardiology

## 2024-08-14 ENCOUNTER — Other Ambulatory Visit: Payer: Self-pay

## 2024-08-14 DIAGNOSIS — I4891 Unspecified atrial fibrillation: Secondary | ICD-10-CM

## 2024-08-14 DIAGNOSIS — I63411 Cerebral infarction due to embolism of right middle cerebral artery: Secondary | ICD-10-CM

## 2024-08-14 MED ORDER — SPIRONOLACTONE 25 MG PO TABS: 25.0000 mg | ORAL_TABLET | Freq: Every day | ORAL | 3 refills | Status: AC

## 2024-08-14 MED ORDER — LOSARTAN POTASSIUM 25 MG PO TABS: 25.0000 mg | ORAL_TABLET | Freq: Every day | ORAL | 3 refills | Status: DC

## 2024-08-14 MED ORDER — FUROSEMIDE 20 MG PO TABS: 20.0000 mg | ORAL_TABLET | Freq: Every day | ORAL | 3 refills | Status: AC

## 2024-08-14 MED ORDER — DABIGATRAN ETEXILATE MESYLATE 150 MG PO CAPS: 150.0000 mg | ORAL_CAPSULE | Freq: Two times a day (BID) | ORAL | 3 refills | Status: AC

## 2024-08-14 NOTE — Telephone Encounter (Signed)
 Pt c/o medication issue:  1. Name of Medication:   dabigatran  (PRADAXA ) 150 MG CAPS capsule  furosemide  (LASIX ) 20 MG tablet  spironolactone  (ALDACTONE ) 25 MG tablet   2. How are you currently taking this medication (dosage and times per day)?   As prescribed  3. Are you having a reaction (difficulty breathing--STAT)?   4. What is your medication issue?   Patient wants to know if he still needs to be taking these medications.  Patient also wants to confirm if his Losartan  medication is being replaced.  Patient also noted he will need refills sent to [facility] - [place], [place] - [address] as he only has enough medications until Sunday (10/5).

## 2024-08-14 NOTE — Telephone Encounter (Signed)
 The patient's Pradaxa , Lasix , Losartan  and Spirinolactone were re-filled and sent to the patient's pharmacy. The patient was made aware and had no further questions at this time.

## 2024-08-14 NOTE — Telephone Encounter (Signed)
 Called the patient and explained to him that we had not heard back from the team at [facility] that works to help patient's be able to afford their medications. Also I had spoken to Dr. Monetta and he had recommended to stop the Entresto and re-start the patient's Losartan  medication. Patient verbalized understanding and had no further questions at this time.

## 2024-08-19 ENCOUNTER — Other Ambulatory Visit: Payer: Self-pay

## 2024-08-19 ENCOUNTER — Ambulatory Visit: Payer: Self-pay | Attending: Cardiology

## 2024-08-19 DIAGNOSIS — I480 Paroxysmal atrial fibrillation: Secondary | ICD-10-CM

## 2024-08-19 DIAGNOSIS — I10 Essential (primary) hypertension: Secondary | ICD-10-CM

## 2024-08-19 DIAGNOSIS — E782 Mixed hyperlipidemia: Secondary | ICD-10-CM

## 2024-08-19 DIAGNOSIS — Z79899 Other long term (current) drug therapy: Secondary | ICD-10-CM

## 2024-08-19 DIAGNOSIS — Z7901 Long term (current) use of anticoagulants: Secondary | ICD-10-CM

## 2024-08-19 NOTE — Telephone Encounter (Signed)
 H&V Care Navigation CSW Progress Note  Clinical Social Worker had sent application to pt on 9/30 for Entresto, unfortunately later last week we received notice that Entresto is no longer offered for patients through [employer] I have sent a message to RN requesting alternate recommendations as pt not eligible for Medicaid, does not have alternate insurance at this time.  Patient is participating in a Managed Medicaid Plan:  no, self pay only  SDOH Screenings   Food Insecurity: No Food Insecurity (08/13/2024)  Housing: Low Risk  (08/13/2024)  Transportation Needs: No Transportation Needs (08/13/2024)  Utilities: Not At Risk (08/13/2024)  Depression (PHQ2-9): Low Risk  (02/07/2024)  Recent Concern: Depression (PHQ2-9) - Medium Risk (12/13/2023)  Financial Resource Strain: Low Risk  (08/13/2024)  Social Connections: Socially Isolated (06/16/2024)  Tobacco Use: Medium Risk (08/12/2024)  Health Literacy: Adequate Health Literacy (08/13/2024)    Marit Lark, MSW, LCSW Clinical Social Worker II [facility] Heart/Vascular Care Navigation  [phone]- work cell phone (preferred)

## 2024-08-19 NOTE — Telephone Encounter (Signed)
 Spoke with Dr. Monetta regarding the patient's Entresto being cost prohibitive for him. Dr. Monetta recommended stopping the Entresto and restarting the patient's Losartan  at 25 mg daily. The patient was called and informed of Dr. Leandrew recommendation and he verbalized understanding and had had no further questions at this time.

## 2024-08-23 ENCOUNTER — Telehealth: Payer: Self-pay | Admitting: Licensed Clinical Social Worker

## 2024-08-23 NOTE — Telephone Encounter (Signed)
 H&V Care Navigation CSW Progress Note  Clinical Social Worker contacted patient by phone to f/u on resources and applications sent for assistance. Was able to reach pt today at [phone]. Re-introduced self, role, reason for call. Pt shares he received documents, his daughter has returned from her trip and he hopes to have her help with completing those next week. Encouraged them to call me as needed for any assistance.  Patient is participating in a Managed Medicaid Plan:  No, self pay only  SDOH Screenings   Food Insecurity: No Food Insecurity (08/13/2024)  Housing: Low Risk  (08/13/2024)  Transportation Needs: No Transportation Needs (08/13/2024)  Utilities: Not At Risk (08/13/2024)  Depression (PHQ2-9): Low Risk  (02/07/2024)  Recent Concern: Depression (PHQ2-9) - Medium Risk (12/13/2023)  Financial Resource Strain: Low Risk  (08/13/2024)  Social Connections: Socially Isolated (06/16/2024)  Tobacco Use: Medium Risk (08/12/2024)  Health Literacy: Adequate Health Literacy (08/13/2024)    Marit Lark, MSW, LCSW Clinical Social Worker II [facility] Health Heart/Vascular Care Navigation  [phone]- work cell phone (preferred)

## 2024-08-30 ENCOUNTER — Telehealth: Payer: Self-pay | Admitting: Licensed Clinical Social Worker

## 2024-08-30 NOTE — Telephone Encounter (Signed)
 H&V Care Navigation CSW Progress Note  Clinical Social Worker contacted patient by phone to f/u on patient assistance forms. No answer today at [phone], left voicemail. Will re-attempt again as able.  Patient is participating in a Managed Medicaid Plan:  No, self pay only  SDOH Screenings   Food Insecurity: No Food Insecurity (08/13/2024)  Housing: Low Risk  (08/13/2024)  Transportation Needs: No Transportation Needs (08/13/2024)  Utilities: Not At Risk (08/13/2024)  Depression (PHQ2-9): Low Risk  (02/07/2024)  Recent Concern: Depression (PHQ2-9) - Medium Risk (12/13/2023)  Financial Resource Strain: Low Risk  (08/13/2024)  Social Connections: Socially Isolated (06/16/2024)  Tobacco Use: Medium Risk (08/12/2024)  Health Literacy: Adequate Health Literacy (08/13/2024)    Marit Lark, MSW, LCSW Clinical Social Worker II [facility] Health Heart/Vascular Care Navigation  [phone]- work cell phone (preferred)

## 2024-09-03 ENCOUNTER — Ambulatory Visit: Payer: Self-pay | Admitting: Family Medicine

## 2024-09-03 ENCOUNTER — Telehealth: Payer: Self-pay | Admitting: Licensed Clinical Social Worker

## 2024-09-03 NOTE — Telephone Encounter (Signed)
 H&V Care Navigation CSW Progress Note  Clinical Social Worker contacted patient by phone to f/u on assistance applications. Was able to reach pt today at [phone], plans to sit down with daughter and applications this evening. Provided him my clinic hours this week and encouraged him to call me if needed.  Patient is participating in a Managed Medicaid Plan:  No, self pay only  SDOH Screenings   Food Insecurity: No Food Insecurity (08/13/2024)  Housing: Low Risk  (08/13/2024)  Transportation Needs: No Transportation Needs (08/13/2024)  Utilities: Not At Risk (08/13/2024)  Depression (PHQ2-9): Low Risk  (02/07/2024)  Recent Concern: Depression (PHQ2-9) - Medium Risk (12/13/2023)  Financial Resource Strain: Low Risk  (08/13/2024)  Social Connections: Socially Isolated (06/16/2024)  Tobacco Use: Medium Risk (08/12/2024)  Health Literacy: Adequate Health Literacy (08/13/2024)    Cody Baird, MSW, LCSW Clinical Social Worker II [facility] Health Heart/Vascular Care Navigation  [phone]- work cell phone (preferred)

## 2024-09-04 DIAGNOSIS — I10 Essential (primary) hypertension: Secondary | ICD-10-CM

## 2024-09-04 DIAGNOSIS — I480 Paroxysmal atrial fibrillation: Secondary | ICD-10-CM

## 2024-09-12 ENCOUNTER — Telehealth (HOSPITAL_BASED_OUTPATIENT_CLINIC_OR_DEPARTMENT_OTHER): Payer: Self-pay | Admitting: Licensed Clinical Social Worker

## 2024-09-12 NOTE — Telephone Encounter (Signed)
 H&V Care Navigation CSW Progress Note  Clinical Social Worker contacted patient by phone to f/u on patient assistance forms sent to pt. Reached pt today at [phone]. Shared he spoke with [place] [employer] about [place] but they had difficulty verifying his identification, shared that it did not match what's on file. Encouraged pt to call them back with daughter present when he does to ensure they both understand what is going on/what his options may be. Again encouraged pt to complete [employer] for previous bills and I remain available to answer any questions about that. No additional questions from pt at this time. Remain available as needed for additional questions that may arise. Have provided multiple follow up calls at this time.  Patient is participating in a Managed Medicaid Plan:  No, self pay only  SDOH Screenings   Food Insecurity: No Food Insecurity (08/13/2024)  Housing: Low Risk  (08/13/2024)  Transportation Needs: No Transportation Needs (08/13/2024)  Utilities: Not At Risk (08/13/2024)  Depression (PHQ2-9): Low Risk  (02/07/2024)  Recent Concern: Depression (PHQ2-9) - Medium Risk (12/13/2023)  Financial Resource Strain: Low Risk  (08/13/2024)  Social Connections: Socially Isolated (06/16/2024)  Tobacco Use: Medium Risk (08/12/2024)  Health Literacy: Adequate Health Literacy (08/13/2024)    Marit Lark, MSW, LCSW Clinical Social Worker II [facility] Health Heart/Vascular Care Navigation  [phone]- work cell phone (preferred)

## 2024-09-16 ENCOUNTER — Institutional Professional Consult (permissible substitution): Payer: Self-pay | Admitting: Cardiology

## 2024-09-24 ENCOUNTER — Telehealth: Payer: Self-pay | Admitting: Licensed Clinical Social Worker

## 2024-09-24 NOTE — Telephone Encounter (Signed)
 H&V Care Navigation CSW Progress Note  Clinical Social Worker has f/u with pt multiple times to provide assistance with insurance concerns. At this time no additional f/u from pt. Remain available as needed should pt re-engage with care navigation team/have any questions.  Patient is participating in a Managed Medicaid Plan:  No, self pay only  SDOH Screenings   Food Insecurity: No Food Insecurity (08/13/2024)  Housing: Low Risk  (08/13/2024)  Transportation Needs: No Transportation Needs (08/13/2024)  Utilities: Not At Risk (08/13/2024)  Depression (PHQ2-9): Low Risk  (02/07/2024)  Recent Concern: Depression (PHQ2-9) - Medium Risk (12/13/2023)  Financial Resource Strain: Low Risk  (08/13/2024)  Social Connections: Socially Isolated (06/16/2024)  Tobacco Use: Medium Risk (08/12/2024)  Health Literacy: Adequate Health Literacy (08/13/2024)    Marit Lark, MSW, LCSW Clinical Social Worker II [facility] Health Heart/Vascular Care Navigation  [phone]- work cell phone (preferred)

## 2024-09-30 ENCOUNTER — Telehealth: Payer: Self-pay | Admitting: Family Medicine

## 2024-09-30 NOTE — Telephone Encounter (Signed)
 Due to a change in our office schedule, your appointment must be changed It is very important that we reach you to reschedule this appointment on 10/08/2024 if pt calls back resch appt

## 2024-10-02 ENCOUNTER — Other Ambulatory Visit: Payer: Self-pay

## 2024-10-02 ENCOUNTER — Telehealth: Payer: Self-pay

## 2024-10-02 MED ORDER — METOPROLOL SUCCINATE ER 25 MG PO TB24
ORAL_TABLET | ORAL | 3 refills | Status: AC
Start: 2024-10-02 — End: ?

## 2024-10-02 NOTE — Telephone Encounter (Signed)
 Returned patient's call at this time. Confirmed with patient that he will be taking metoprolol  25 mg in the morning and then 12.5 mg in the evening. Patient denies any other questions/concerns.  Alan, RN

## 2024-10-02 NOTE — Telephone Encounter (Signed)
 Pt is calling back because he is confused on the directions of taking the medication. Please advise.

## 2024-10-02 NOTE — Telephone Encounter (Signed)
 Received the following message below from Dr. Monetta:  Monitor shows heart rate is more rapid than I would like it to be and I think he would benefit from taking one half of the Toprol  12.5 mg also in the evening along with his morning 25 mg.  Called the patient and informed him of the results of his heart monitor and the new Metoprolol  succinate medication dose recommended by Dr. Monetta. Patient verbalized understanding and a new prescription was sent to the patient's pharmacy. Patient had no further questions at this time.

## 2024-10-08 ENCOUNTER — Ambulatory Visit: Payer: Self-pay | Admitting: Family Medicine

## 2024-11-04 NOTE — Progress Notes (Unsigned)
 " Cardiology Office Note:    Date:  11/05/2024   ID:  Cody Baird, DOB 07-27-1961, MRN [id number]  PCP:  Delbert Clam, MD  Cardiologist:  Redell Leiter, MD    Referring MD: Delbert Clam, MD    ASSESSMENT:    1. PAF (paroxysmal atrial fibrillation) (HCC)   2. On amiodarone  therapy   3. Chronic anticoagulation   4. Hypertensive heart disease with heart failure (HCC)   5. Mixed hyperlipidemia    PLAN:    In order of problems listed above:  [name] is doing well maintaining sinus rhythm with low-dose amiodarone  and tolerates his anticoagulant without bleeding complication continue both Unfortunately mains short of breath which I suspect is LV dysfunction he will transition from ARB to Entresto  check blood pressures daily and leave a list in 2 weeks returns for lab work including proBNP CBC renal function and thyroids Blood pressure is stable uptitrate his Entresto  Follow-up echocardiogram in 2 to 3 months if EF remains reduced or remains symptomatic he is interested in atrial fibrillation ablation Continue his current lipid-lowering treatment with history of stroke will check a lipid profile also   Next appointment: 3 months   Medication Adjustments/Labs and Tests Ordered: Current medicines are reviewed at length with the patient today.  Concerns regarding medicines are outlined above.  Orders Placed This Encounter  Procedures   Comp Met (CMET)   Lipid Profile   CBC   Pro b natriuretic peptide (BNP)   TSH+T4F+T3Free   EKG 12-Lead   Meds ordered this encounter  Medications   sacubitril -valsartan  (ENTRESTO ) 24-26 MG    Sig: Take 1 tablet by mouth 2 (two) times daily.    Dispense:  180 tablet    Refill:  3     History of Present Illness:    [name] is a 63 y.o. male with a hx of peripheral vascular disease hypertension hyperlipidemia and stroke with atrial fibrillation and cardiomyopathy EF in the range of 35 to 40% resuming sinus rhythm with cardioversion and  amiodarone  therapy last seen 08/05/2024.  Overall Dorothy is feeling well except he still has shortness of breath when he walks on an incline longer distance or does heavy housework he would like to feel better and asked me now that he has insurance would he be improved with Entresto  He answers yes He is not having edema orthopnea chest pain palpitation or syncope He has had no bleeding with his anticoagulant  Compliance with diet, lifestyle and medications: Yes Past Medical History:  Diagnosis Date   Acute right MCA stroke (HCC) 01/30/2020   Aftercare following surgery of the circulatory system, NEC 01/14/2014   Arthritis    low back and left hip   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   IMO SNOMED Dx Update Oct 2024     Atherosclerotic peripheral vascular disease with rest pain (HCC) 10/30/2013   Atrial fibrillation (HCC) 06/17/2024   Atrial fibrillation with RVR (HCC) 06/16/2024   B12 deficiency    Bradycardia    Chronic back pain    CVA (cerebral vascular accident) (HCC) 01/28/2020   Essential hypertension 01/28/2020   History of bronchitis    last time about 2-45yrs ago   History of stroke 06/16/2024   Hyperlipidemia 06/16/2024   Ischemic pain of foot 12/24/2013   PAD (peripheral artery disease)    Pain in limb 06/25/2013   PBA (pseudobulbar affect)    Peripheral vascular disease    Peripheral vascular  disease, unspecified 06/25/2013   Post-op pain 09/17/2013   Post-operative pain 07/16/2013   Prolapsed, anus    hx of   PVD (peripheral vascular disease) 09/17/2013   Stroke (HCC) 01/2020   TIA (transient ischemic attack) 01/28/2020   Tobacco abuse 01/28/2020   Venous stasis ulcer of ankle, right (HCC) 02/17/2020    Current Medications: Active Medications[1]    EKGs/Labs/Other Studies Reviewed:    The following studies were reviewed today:  Cardiac Studies & Procedures    ______________________________________________________________________________________________     ECHOCARDIOGRAM  ECHOCARDIOGRAM COMPLETE 06/17/2024  Narrative ECHOCARDIOGRAM REPORT    Patient Name:   Cody Baird Date of Exam: 06/17/2024 Medical Rec #:  [id number]    Height:       72.0 in Accession #:    [id number]   Weight:       252.0 lb Date of Birth:  1961/10/10    BSA:          2.350 m Patient Age:    63 years     BP:           129/93 mmHg Patient Gender: M            HR:           99 bpm. Exam Location:  Inpatient  Procedure: 2D Echo, Cardiac Doppler, Color Doppler and Intracardiac Opacification Agent (Both Spectral and Color Flow Doppler were utilized during procedure).  Indications:    A-fib  History:        Patient has prior history of Echocardiogram examinations. Arrythmias:Atrial Fibrillation; Risk Factors:Hypertension.  Sonographer:    Vella Key Referring Phys: [id number] VISHAL R PATEL  IMPRESSIONS   1. Left ventricular ejection fraction is variable due to afib, 40-50%. The left ventricle has mildly decreased function. The left ventricle demonstrates global hypokinesis. The left ventricular internal cavity size was mildly dilated. Left ventricular diastolic function could not be evaluated. 2. Right ventricular systolic function is mildly reduced. The right ventricular size is normal. Tricuspid regurgitation signal is inadequate for assessing PA pressure. 3. Left atrial size was mildly dilated. 4. The mitral valve is normal in structure. No evidence of mitral valve regurgitation. No evidence of mitral stenosis. 5. The aortic valve is normal in structure. Aortic valve regurgitation is not visualized. No aortic stenosis is present. 6. The inferior vena cava is normal in size with greater than 50% respiratory variability, suggesting right atrial pressure of 3 mmHg.  FINDINGS Left Ventricle: Left ventricular ejection fraction is variable due to afib, 40-50%. The left  ventricle has mildly decreased function. The left ventricle demonstrates global hypokinesis. The left ventricular internal cavity size was mildly dilated. There is no left ventricular hypertrophy. Left ventricular diastolic function could not be evaluated due to atrial fibrillation. Left ventricular diastolic function could not be evaluated.  Right Ventricle: The right ventricular size is normal. No increase in right ventricular wall thickness. Right ventricular systolic function is mildly reduced. Tricuspid regurgitation signal is inadequate for assessing PA pressure.  Left Atrium: Left atrial size was mildly dilated.  Right Atrium: Right atrial size was normal in size.  Pericardium: There is no evidence of pericardial effusion.  Mitral Valve: The mitral valve is normal in structure. No evidence of mitral valve regurgitation. No evidence of mitral valve stenosis.  Tricuspid Valve: The tricuspid valve is normal in structure. Tricuspid valve regurgitation is trivial. No evidence of tricuspid stenosis.  Aortic Valve: The aortic valve is normal in structure. Aortic valve regurgitation is not visualized.  No aortic stenosis is present.  Pulmonic Valve: The pulmonic valve was normal in structure. Pulmonic valve regurgitation is not visualized. No evidence of pulmonic stenosis.  Aorta: The aortic root is normal in size and structure.  Venous: The inferior vena cava is normal in size with greater than 50% respiratory variability, suggesting right atrial pressure of 3 mmHg.  IAS/Shunts: No atrial level shunt detected by color flow Doppler.   LEFT VENTRICLE PLAX 2D LVIDd:         6.00 cm LVIDs:         6.00 cm LV PW:         1.30 cm LV IVS:        1.30 cm LVOT diam:     2.10 cm LV SV:         45 LV SV Index:   19 LVOT Area:     3.46 cm  LV Volumes (MOD) LV vol d, MOD A2C: 166.0 ml LV vol d, MOD A4C: 178.0 ml LV vol s, MOD A2C: 73.2 ml LV vol s, MOD A4C: 91.2 ml LV SV MOD A2C:      92.8 ml LV SV MOD A4C:     178.0 ml LV SV MOD BP:      93.1 ml  RIGHT VENTRICLE RV Basal diam:  2.90 cm RV S prime:     8.49 cm/s TAPSE (M-mode): 1.5 cm  LEFT ATRIUM             Index        RIGHT ATRIUM           Index LA diam:        3.90 cm 1.66 cm/m   RA Area:     25.40 cm LA Vol (A2C):   95.4 ml 40.59 ml/m  RA Volume:   82.60 ml  35.14 ml/m LA Vol (A4C):   44.5 ml 18.93 ml/m LA Biplane Vol: 70.9 ml 30.17 ml/m AORTIC VALVE LVOT Vmax:   81.40 cm/s LVOT Vmean:  56.900 cm/s LVOT VTI:    0.130 m  AORTA Ao Root diam: 4.20 cm Ao Asc diam:  3.50 cm  MITRAL VALVE MV Area (PHT): 5.70 cm    SHUNTS MV Decel Time: 133 msec    Systemic VTI:  0.13 m MV E velocity: 85.90 cm/s  Systemic Diam: 2.10 cm MV A velocity: 32.00 cm/s MV E/A ratio:  2.68  Morene Brownie Electronically signed by Morene Brownie Signature Date/Time: 06/17/2024/1:13:54 PM    Final   TEE  ECHO TEE 06/18/2024  Narrative TRANSESOPHOGEAL ECHO REPORT    Patient Name:   BARACK A Reyburn Date of Exam: 06/18/2024 Medical Rec #:  [id number]    Height:       72.0 in Accession #:    [id number]   Weight:       250.9 lb Date of Birth:  1961-10-03    BSA:          2.346 m Patient Age:    63 years     BP:           127/66 mmHg Patient Gender: M            HR:           109 bpm. Exam Location:  Inpatient  Procedure: Transesophageal Echo, Cardiac Doppler and Color Doppler (Both Spectral and Color Flow Doppler were utilized during procedure).  Indications:     I48.91* Unspecified atrial fibrillation  History:  Patient has prior history of Echocardiogram examinations, most recent 06/17/2024. PAD, Arrythmias:Atrial Fibrillation; Risk Factors:Hypertension and Dyslipidemia.  Sonographer:     Damien Senior RDCS Referring Phys:  [id number] SHENG L HALEY Diagnosing Phys: Kardie Tobb DO  PROCEDURE: After discussion of the risks and benefits of a TEE, an informed consent was obtained from the patient. The  transesophogeal probe was passed without difficulty through the esophogus of the patient. Sedation performed by different physician. The patient was monitored while under deep sedation. Anesthestetic sedation was provided intravenously by Anesthesiology: 314mg  of Propofol , 100mg  of Lidocaine . The patient developed no complications during the procedure. An unsuccessful direct current cardioversion was performed at 360 joules with 3 attempts.  IMPRESSIONS   1. Left ventricular ejection fraction, by estimation, is 35 to 40%. The left ventricle has moderately decreased function. 2. Right ventricular systolic function is mildly reduced. The right ventricular size is normal. 3. Left atrial size was mildly dilated. No left atrial/left atrial appendage thrombus was detected. 4. A small pericardial effusion is present. The pericardial effusion is circumferential. 5. The mitral valve is normal in structure. Mild mitral valve regurgitation. 6. The aortic valve is tricuspid. Aortic valve regurgitation is not visualized.  FINDINGS Left Ventricle: Left ventricular ejection fraction, by estimation, is 35 to 40%. The left ventricle has moderately decreased function. The left ventricular internal cavity size was normal in size.  Right Ventricle: The right ventricular size is normal. No increase in right ventricular wall thickness. Right ventricular systolic function is mildly reduced.  Left Atrium: Left atrial size was mildly dilated. No left atrial/left atrial appendage thrombus was detected.  Right Atrium: Right atrial size was normal in size.  Pericardium: A small pericardial effusion is present. The pericardial effusion is circumferential.  Mitral Valve: The mitral valve is normal in structure. Mild mitral valve regurgitation.  Tricuspid Valve: The tricuspid valve is normal in structure. Tricuspid valve regurgitation is trivial.  Aortic Valve: The aortic valve is tricuspid. Aortic valve regurgitation  is not visualized.  Pulmonic Valve: The pulmonic valve was normal in structure. Pulmonic valve regurgitation is trivial.  Aorta: The aortic root and ascending aorta are structurally normal, with no evidence of dilitation.  Venous: The left upper pulmonary vein, left lower pulmonary vein, right upper pulmonary vein and right lower pulmonary vein are normal.  IAS/Shunts: No atrial level shunt detected by color flow Doppler.  Additional Comments: Spectral Doppler performed.   AORTA Ao Asc diam: 3.50 cm  Kardie Tobb DO Electronically signed by Dub Huntsman DO Signature Date/Time: 06/18/2024/2:05:44 PM    Final  MONITORS  LONG TERM MONITOR (3-14 DAYS) 09/03/2024  Narrative Patch Wear Time:  11 days and 10 hours (2025-10-06T10:06:22-0400 to 2025-10-17T20:47:31-0400)  There were no symptomatic events.  Atrial Fibrillation occurred continuously (100% burden), ranging from 45-187 bpm (avg of 90 bpm).  Heart rate was greater than 110 bpm daytime 19% of the time with a relatively high average heart rate 90 bpm.  There were no pauses 3 seconds or greater no episodes of heart block.  Isolated VEs were rare (<1.0%, 776), VE Triplets were rare (<1.0%, 1), and no VE Couplets were present.       ______________________________________________________________________________________________      EKG Interpretation Date/Time:  Tuesday November 05 2024 10:21:19 EST Ventricular Rate:  69 PR Interval:  146 QRS Duration:  90 QT Interval:  408 QTC Calculation: 437 R Axis:   87  Text Interpretation: Normal sinus rhythm Normal ECG When compared with ECG of 24-Jul-2024 10:49,  No significant change was found Confirmed by Monetta Rogue ([id number]) on 11/05/2024 10:34:48 AM   Recent Labs: 06/16/2024: B Natriuretic Peptide 257.2 06/18/2024: Magnesium 2.1 08/05/2024: ALT 31; BUN 16; Creatinine, Ser 1.05; Hemoglobin 17.5; NT-Pro BNP 228; Platelets 183; Potassium 4.5; Sodium 136; TSH 1.210  Recent Lipid  Panel    Component Value Date/Time   CHOL 119 01/10/2024 1018   TRIG 109 01/10/2024 1018   HDL 44 01/10/2024 1018   CHOLHDL 2.7 01/10/2024 1018   CHOLHDL 6.6 01/28/2020 0612   VLDL 31 01/28/2020 0612   LDLCALC 55 01/10/2024 1018   EKG Interpretation Date/Time:  Tuesday November 05 2024 10:21:19 EST Ventricular Rate:  69 PR Interval:  146 QRS Duration:  90 QT Interval:  408 QTC Calculation: 437 R Axis:   87  Text Interpretation: Normal sinus rhythm Normal ECG When compared with ECG of 24-Jul-2024 10:49, No significant change was found Confirmed by Monetta Rogue ([id number]) on 11/05/2024 10:34:48 AM    Physical Exam:    VS:  BP 112/78   Pulse 69   Ht 6' (1.829 m)   Wt 260 lb 12.8 oz (118.3 kg)   SpO2 95%   BMI 35.37 kg/m     Wt Readings from Last 3 Encounters:  11/05/24 260 lb 12.8 oz (118.3 kg)  08/12/24 249 lb (112.9 kg)  08/05/24 252 lb 6.4 oz (114.5 kg)     GEN:  Well nourished, well developed in no acute distress HEENT: Normal NECK: No JVD; No carotid bruits LYMPHATICS: No lymphadenopathy CARDIAC: RRR, no murmurs, rubs, gallops RESPIRATORY:  Clear to auscultation without rales, wheezing or rhonchi  ABDOMEN: Soft, non-tender, non-distended MUSCULOSKELETAL:  No edema; No deformity  SKIN: Warm and dry NEUROLOGIC:  Alert and oriented x 3 PSYCHIATRIC:  Normal affect    Signed, Rogue Monetta, MD  11/05/2024 11:32 AM    [facility]      [1]  Current Meds  Medication Sig   albuterol  (VENTOLIN  HFA) 108 (90 Base) MCG/ACT inhaler Inhale 2 puffs into the lungs every 6 (six) hours as needed for wheezing or shortness of breath.   amiodarone  (PACERONE ) 200 MG tablet Take 1 tablet (200 mg total) by mouth daily for 7 days.   atorvastatin  (LIPITOR) 40 MG tablet Take 1 tablet (40 mg total) by mouth daily.   dabigatran  (PRADAXA ) 150 MG CAPS capsule Take 1 capsule (150 mg total) by mouth every 12 (twelve) hours.   diphenhydrAMINE  (BENADRYL ) 25 MG  tablet Take 25-50 mg by mouth every 6 (six) hours as needed for itching or allergies.   furosemide  (LASIX ) 20 MG tablet Take 1 tablet (20 mg total) by mouth daily.   metoprolol  succinate (TOPROL  XL) 25 MG 24 hr tablet Take this medication 25 mg = 1 pill in the morning and 12.5 mg  = 1/2 pill in the evening.   sacubitril -valsartan  (ENTRESTO ) 24-26 MG Take 1 tablet by mouth 2 (two) times daily.   spironolactone  (ALDACTONE ) 25 MG tablet Take 1 tablet (25 mg total) by mouth daily.   [DISCONTINUED] losartan  (COZAAR ) 25 MG tablet Take 1 tablet (25 mg total) by mouth daily.   "

## 2024-11-05 ENCOUNTER — Ambulatory Visit: Payer: Self-pay | Attending: Cardiology | Admitting: Cardiology

## 2024-11-05 ENCOUNTER — Encounter: Payer: Self-pay | Admitting: Cardiology

## 2024-11-05 VITALS — BP 112/78 | HR 69 | Ht 72.0 in | Wt 260.8 lb

## 2024-11-05 DIAGNOSIS — Z79899 Other long term (current) drug therapy: Secondary | ICD-10-CM

## 2024-11-05 DIAGNOSIS — I110 Hypertensive heart disease with heart failure: Secondary | ICD-10-CM

## 2024-11-05 DIAGNOSIS — Z7901 Long term (current) use of anticoagulants: Secondary | ICD-10-CM

## 2024-11-05 DIAGNOSIS — I480 Paroxysmal atrial fibrillation: Secondary | ICD-10-CM

## 2024-11-05 DIAGNOSIS — E782 Mixed hyperlipidemia: Secondary | ICD-10-CM

## 2024-11-05 MED ORDER — SACUBITRIL-VALSARTAN 24-26 MG PO TABS: 1.0000 | ORAL_TABLET | Freq: Two times a day (BID) | ORAL | 3 refills | Status: AC

## 2024-11-05 NOTE — Patient Instructions (Signed)
 Medication Instructions:  Your physician has recommended you make the following change in your medication:   STOP: Losartan  START: Entresto  24 - 26 two times daily  *If you need a refill on your cardiac medications before your next appointment, please call your pharmacy*  Lab Work: Your physician recommends that you return for lab work in:   Labs in 2 weeks: CMP, Lipids, CBC, Pro BNP, TSH T3 T4  If you have labs (blood work) drawn today and your tests are completely normal, you will receive your results only by: MyChart Message (if you have MyChart) OR A paper copy in the mail If you have any lab test that is abnormal or we need to change your treatment, we will call you to review the results.  Testing/Procedures: None  Follow-Up: At [facility], you and your health needs are our priority.  As part of our continuing mission to provide you with exceptional heart care, our providers are all part of one team.  This team includes your primary Cardiologist (physician) and Advanced Practice Providers or APPs (Physician Assistants and Nurse Practitioners) who all work together to provide you with the care you need, when you need it.  Your next appointment:   3 month(s)  Provider:   Redell Leiter, MD    We recommend signing up for the patient portal called MyChart.  Sign up information is provided on this After Visit Summary.  MyChart is used to connect with patients for Virtual Visits (Telemedicine).  Patients are able to view lab/test results, encounter notes, upcoming appointments, etc.  Non-urgent messages can be sent to your provider as well.   To learn more about what you can do with MyChart, go to [email or web address].   Other Instructions  Purchase an [employer] blood pressure device at [facility]  Please keep a BP log for 2 weeks and send by MyChart or mail.                      Dr. Leiter [address] [place], [place] [address]  Blood Pressure Record  Sheet To take your blood pressure, you will need a blood pressure machine. You can buy a blood pressure machine (blood pressure monitor) at your clinic, drug store, or online. When choosing one, consider: An automatic monitor that has an arm cuff. A cuff that wraps snugly around your upper arm. You should be able to fit only one finger between your arm and the cuff. A device that stores blood pressure reading results. Do not choose a monitor that measures your blood pressure from your wrist or finger. Follow your health care provider's instructions for how to take your blood pressure. To use this form: Get one reading in the morning (a.m.) 1-2 hours after you take any medicines. Get one reading in the evening (p.m.) before supper.   Blood pressure log Date: _______________________  a.m. _____________________(1st reading) HR___________            p.m. _____________________(2nd reading) HR__________  Date: _______________________  a.m. _____________________(1st reading) HR___________            p.m. _____________________(2nd reading) HR__________  Date: _______________________  a.m. _____________________(1st reading) HR___________            p.m. _____________________(2nd reading) HR__________  Date: _______________________  a.m. _____________________(1st reading) HR___________            p.m. _____________________(2nd reading) HR__________  Date: _______________________  a.m. _____________________(1st reading) HR___________  p.m. _____________________(2nd reading) HR__________  Date: _______________________  a.m. _____________________(1st reading) HR___________            p.m. _____________________(2nd reading) HR__________  Date: _______________________  a.m. _____________________(1st reading) HR___________            p.m. _____________________(2nd reading) HR__________   This information is not intended to replace advice given to you by your health  care provider. Make sure you discuss any questions you have with your health care provider. Document Revised: 02/19/2020 Document Reviewed: 02/19/2020 Elsevier Patient Education  2021 [employer].

## 2024-11-27 ENCOUNTER — Ambulatory Visit: Payer: Self-pay | Attending: Family Medicine | Admitting: Family Medicine

## 2024-11-27 VITALS — BP 118/68 | HR 63 | Temp 98.2°F | Ht 72.0 in | Wt 262.8 lb

## 2024-11-27 DIAGNOSIS — Z8673 Personal history of transient ischemic attack (TIA), and cerebral infarction without residual deficits: Secondary | ICD-10-CM | POA: Diagnosis not present

## 2024-11-27 DIAGNOSIS — I10 Essential (primary) hypertension: Secondary | ICD-10-CM

## 2024-11-27 DIAGNOSIS — I5042 Chronic combined systolic (congestive) and diastolic (congestive) heart failure: Secondary | ICD-10-CM

## 2024-11-27 DIAGNOSIS — I110 Hypertensive heart disease with heart failure: Secondary | ICD-10-CM

## 2024-11-27 DIAGNOSIS — I480 Paroxysmal atrial fibrillation: Secondary | ICD-10-CM

## 2024-11-27 DIAGNOSIS — I739 Peripheral vascular disease, unspecified: Secondary | ICD-10-CM | POA: Diagnosis not present

## 2024-11-27 NOTE — Progress Notes (Signed)
 "  Subjective:  Patient ID: Cody Baird, male    DOB: 16-Jan-1961  Age: 64 y.o. MRN: [id number]  CC: Medical Management of Chronic Issues     Discussed the use of AI scribe software for clinical note transcription with the patient, who gave verbal consent to proceed.  History of Present Illness The patient, with a history of tobacco abuse (quit after 40 years of smoking greater than 20-pack-year), PAD s/p R LE bypass graft diagnosed with right MCA stroke in 01/2020 (he presented outside the window for TPA), status post amputation of right lateral 3 toes, Prediabetes (A1c 6.0), nicotine  dependence (quit in 2023), paroxysmal A-fib (status post cardioversion), CHF (EF 35 to 40%) here for chronic disease management.   He has no claudication pain, no chest pain/pedal edema but does have slight dyspnea on exertion.  He has not had any palpitations or dizziness. Last Cardiology visit was 3 weeks ago. Losartan  was substituted with Entresto  with plans to repeat Echo in 3 months.  He remains adherent with his amiodarone    Denies presence of additional concerns today.   Past Medical History:  Diagnosis Date   Acute right MCA stroke (HCC) 01/30/2020   Aftercare following surgery of the circulatory system, NEC 01/14/2014   Arthritis    low back and left hip   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   IMO SNOMED Dx Update Oct 2024     Atherosclerotic peripheral vascular disease with rest pain (HCC) 10/30/2013   Atrial fibrillation (HCC) 06/17/2024   Atrial fibrillation with RVR (HCC) 06/16/2024   B12 deficiency    Bradycardia    Chronic back pain    CVA (cerebral vascular accident) (HCC) 01/28/2020   Essential hypertension 01/28/2020   History of bronchitis    last time about 2-80yrs ago   History of stroke 06/16/2024   Hyperlipidemia 06/16/2024   Ischemic pain of foot 12/24/2013   PAD (peripheral artery disease)    Pain in limb 06/25/2013    PBA (pseudobulbar affect)    Peripheral vascular disease    Peripheral vascular disease, unspecified 06/25/2013   Post-op pain 09/17/2013   Post-operative pain 07/16/2013   Prolapsed, anus    hx of   PVD (peripheral vascular disease) 09/17/2013   Stroke (HCC) 01/2020   TIA (transient ischemic attack) 01/28/2020   Tobacco abuse 01/28/2020   Venous stasis ulcer of ankle, right (HCC) 02/17/2020    Past Surgical History:  Procedure Laterality Date   ABDOMINAL AORTAGRAM N/A 06/26/2013   Procedure: ABDOMINAL AORTAGRAM;  Surgeon: Krystal JULIANNA Doing, MD;  Location: [facility] CATH LAB;  Service: Cardiovascular;  Laterality: N/A;   AMPUTATION Right 12/04/2013   Procedure: AMPUTATION DIGIT- 3RD TOE RIGHT FOOT ;  Surgeon: Krystal JULIANNA Doing, MD;  Location: [facility] OR;  Service: Vascular;  Laterality: Right;   AMPUTATION Right 01/15/2014   Procedure: AMPUTATION OF RIGHT 4TH and 5TH TOES;  Surgeon: Krystal JULIANNA Doing, MD;  Location: [facility] OR;  Service: Vascular;  Laterality: Right;   BACK SURGERY     L1-2   BACK SURGERY     L1-2   BACK SURGERY     thinks L1- 2   CARDIOVERSION N/A 06/18/2024   Procedure: CARDIOVERSION;  Surgeon: Sheena Pugh, DO;  Location: [facility] INVASIVE CV LAB;  Service: Cardiovascular;  Laterality: N/A;   CARDIOVERSION N/A 07/24/2024   Procedure: CARDIOVERSION;  Surgeon: Raford Riggs, MD;  Location: [facility] INVASIVE CV LAB;  Service: Cardiovascular;  Laterality: N/A;  COLON SURGERY     removal of colon due to proplase rectum   COLOSTOMY     COLOSTOMY CLOSURE  2006ish   ESOPHAGOGASTRODUODENOSCOPY     FEMORAL-TIBIAL BYPASS GRAFT Right 06/28/2013   Procedure: BYPASS GRAFT FEMORAL TO TIBIAL PERONEAL TRUNK ARTERY;  Surgeon: Krystal JULIANNA Doing, MD;  Location: [facility] OR;  Service: Vascular;  Laterality: Right;   TRANSESOPHAGEAL ECHOCARDIOGRAM (CATH LAB) N/A 06/18/2024   Procedure: TRANSESOPHAGEAL ECHOCARDIOGRAM;  Surgeon: Sheena Pugh, DO;  Location: [facility] INVASIVE CV LAB;  Service: Cardiovascular;  Laterality: N/A;    Family  History  Problem Relation Age of Onset   Cancer Mother    Diabetes Mother    Diabetes Father    Hyperlipidemia Father    Hypertension Father    Heart attack Father     Social History   Socioeconomic History   Marital status: Divorced    Spouse name: Not on file   Number of children: Not on file   Years of education: Not on file   Highest education level: Not on file  Occupational History   Not on file  Tobacco Use   Smoking status: Former    Current packs/day: 0.00    Average packs/day: 0.5 packs/day for 36.0 years (18.0 ttl pk-yrs)    Types: Cigarettes    Start date: 01/27/1984    Quit date: 01/27/2020    Years since quitting: 4.8   Smokeless tobacco: Never  Substance and Sexual Activity   Alcohol use: No    Alcohol/week: 0.0 standard drinks of alcohol   Drug use: No   Sexual activity: Not on file  Other Topics Concern   Not on file  Social History Narrative   Not on file   Social Drivers of Health   Tobacco Use: Medium Risk (11/05/2024)   Patient History    Smoking Tobacco Use: Former    Smokeless Tobacco Use: Never    Passive Exposure: Not on Actuary Strain: Low Risk (08/13/2024)   Overall Financial Resource Strain (CARDIA)    Difficulty of Paying Living Expenses: Not very hard  Food Insecurity: No Food Insecurity (08/13/2024)   Epic    Worried About Radiation Protection Practitioner of Food in the Last Year: Never true    Ran Out of Food in the Last Year: Never true  Transportation Needs: No Transportation Needs (08/13/2024)   Epic    Lack of Transportation (Medical): No    Lack of Transportation (Non-Medical): No  Physical Activity: Not on file  Stress: Not on file  Social Connections: Socially Isolated (06/16/2024)   Social Connection and Isolation Panel    Frequency of Communication with Friends and Family: Three times a week    Frequency of Social Gatherings with Friends and Family: Never    Attends Religious Services: Never    Database Administrator or  Organizations: No    Attends Banker Meetings: Never    Marital Status: Divorced  Depression (PHQ2-9): Low Risk (02/07/2024)   Depression (PHQ2-9)    PHQ-2 Score: 3  Recent Concern: Depression (PHQ2-9) - Medium Risk (12/13/2023)   Depression (PHQ2-9)    PHQ-2 Score: 5  Alcohol Screen: Not on file  Housing: Low Risk (08/13/2024)   Epic    Unable to Pay for Housing in the Last Year: No    Number of Times Moved in the Last Year: 0    Homeless in the Last Year: No  Utilities: Not At Risk (08/13/2024)   Epic  Threatened with loss of utilities: No  Health Literacy: Adequate Health Literacy (08/13/2024)   B1300 Health Literacy    Frequency of need for help with medical instructions: Rarely    Allergies[1]  Outpatient Medications Prior to Visit  Medication Sig Dispense Refill   albuterol  (VENTOLIN  HFA) 108 (90 Base) MCG/ACT inhaler Inhale 2 puffs into the lungs every 6 (six) hours as needed for wheezing or shortness of breath. 8 g 2   amiodarone  (PACERONE ) 200 MG tablet Take 1 tablet (200 mg total) by mouth daily for 7 days. 90 tablet 3   atorvastatin  (LIPITOR) 40 MG tablet Take 1 tablet (40 mg total) by mouth daily. 90 tablet 1   dabigatran  (PRADAXA ) 150 MG CAPS capsule Take 1 capsule (150 mg total) by mouth every 12 (twelve) hours. 180 capsule 3   diphenhydrAMINE  (BENADRYL ) 25 MG tablet Take 25-50 mg by mouth every 6 (six) hours as needed for itching or allergies.     furosemide  (LASIX ) 20 MG tablet Take 1 tablet (20 mg total) by mouth daily. 90 tablet 3   metoprolol  succinate (TOPROL  XL) 25 MG 24 hr tablet Take this medication 25 mg = 1 pill in the morning and 12.5 mg  = 1/2 pill in the evening. 180 tablet 3   sacubitril -valsartan  (ENTRESTO ) 24-26 MG Take 1 tablet by mouth 2 (two) times daily. 180 tablet 3   spironolactone  (ALDACTONE ) 25 MG tablet Take 1 tablet (25 mg total) by mouth daily. 90 tablet 3   No facility-administered medications prior to visit.      ROS Review of Systems  Constitutional:  Negative for activity change and appetite change.  HENT:  Negative for sinus pressure and sore throat.   Respiratory:  Negative for chest tightness, shortness of breath and wheezing.   Cardiovascular:  Negative for chest pain and palpitations.  Gastrointestinal:  Negative for abdominal distention, abdominal pain and constipation.  Genitourinary: Negative.   Musculoskeletal: Negative.   Psychiatric/Behavioral:  Negative for behavioral problems and dysphoric mood.     Objective:  BP 118/68   Pulse 63   Temp 98.2 F (36.8 C) (Oral)   Ht 6' (1.829 m)   Wt 262 lb 12.8 oz (119.2 kg)   SpO2 95%   BMI 35.64 kg/m      11/27/2024    1:50 PM 11/05/2024   10:18 AM 08/12/2024   10:28 AM  BP/Weight  Systolic BP 118 112 126  Diastolic BP 68 78 74  Wt. (Lbs) 262.8 260.8 249  BMI 35.64 kg/m2 35.37 kg/m2 33.77 kg/m2      Physical Exam Constitutional:      Appearance: He is well-developed.  Cardiovascular:     Rate and Rhythm: Normal rate.     Heart sounds: Normal heart sounds. No murmur heard. Pulmonary:     Effort: Pulmonary effort is normal.     Breath sounds: Normal breath sounds. No wheezing or rales.  Chest:     Chest wall: No tenderness.  Abdominal:     General: Bowel sounds are normal. There is no distension.     Palpations: Abdomen is soft. There is no mass.     Tenderness: There is no abdominal tenderness.  Musculoskeletal:        General: Normal range of motion.     Right lower leg: No edema.     Left lower leg: No edema.  Neurological:     Mental Status: He is alert and oriented to person, place, and time.  Psychiatric:  Mood and Affect: Mood normal.        Latest Ref Rng & Units 08/05/2024    4:24 PM 07/22/2024    9:20 AM 06/19/2024    5:00 AM  CMP  Glucose 70 - 99 mg/dL 897  92  894   BUN 8 - 27 mg/dL 16  13  16    Creatinine 0.76 - 1.27 mg/dL 8.94  8.90  8.92   Sodium 134 - 144 mmol/L 136  139  139    Potassium 3.5 - 5.2 mmol/L 4.5  4.3  4.4   Chloride 96 - 106 mmol/L 99  102  106   CO2 20 - 29 mmol/L 23  22  26    Calcium  8.6 - 10.2 mg/dL 9.8  9.8  9.4   Total Protein 6.0 - 8.5 g/dL 7.6     Total Bilirubin 0.0 - 1.2 mg/dL 0.7     Alkaline Phos 47 - 123 IU/L 77     AST 0 - 40 IU/L 17     ALT 0 - 44 IU/L 31       Lipid Panel     Component Value Date/Time   CHOL 119 01/10/2024 1018   TRIG 109 01/10/2024 1018   HDL 44 01/10/2024 1018   CHOLHDL 2.7 01/10/2024 1018   CHOLHDL 6.6 01/28/2020 0612   VLDL 31 01/28/2020 0612   LDLCALC 55 01/10/2024 1018    CBC    Component Value Date/Time   WBC 8.3 08/05/2024 1624   WBC 8.3 06/17/2024 0508   RBC 5.73 08/05/2024 1624   RBC 5.27 06/17/2024 0508   HGB 17.5 08/05/2024 1624   HCT 52.7 (H) 08/05/2024 1624   PLT 183 08/05/2024 1624   MCV 92 08/05/2024 1624   MCH 30.5 08/05/2024 1624   MCH 30.0 06/17/2024 0508   MCHC 33.2 08/05/2024 1624   MCHC 31.8 06/17/2024 0508   RDW 13.0 08/05/2024 1624   LYMPHSABS 2.0 01/31/2020 0504   MONOABS 0.9 01/31/2020 0504   EOSABS 0.3 01/31/2020 0504   BASOSABS 0.0 01/31/2020 0504    Lab Results  Component Value Date   HGBA1C 5.6 08/10/2023      1. Essential hypertension (Primary) Controlled -Continue current regimen -Counseled on blood pressure goal of less than 130/80, low-sodium, DASH diet, medication compliance, 150 minutes of moderate intensity exercise per week. Discussed medication compliance, adverse effects.   2. Peripheral vascular disease, unspecified S/p RIGHT LOWER EXTREMITY bypass graft  3. History of stroke Stable -Secondary risk factor modification -Continue statin  4. Paroxysmal atrial fibrillation (HCC) -S/p cardioversion -Currently in sinus rhythm -Continue Amiodarone   5. Hypertensive heart disease with chronic combined systolic and diastolic congestive heart failure (HCC) NYHA II-III -EF of 30-35% -Continue Entresto , Spironolactone , BB, SGLT2i -Repeat echo  in 3 months  -Follow up with Cardiology     No orders of the defined types were placed in this encounter.   Follow-up: Return in about 6 months (around 05/27/2025).       Corrina Sabin, MD, FAAFP. [facility] [place], [place] [phone]   11/27/2024, 6:16 PM     [1]  Allergies Allergen Reactions   Penicillins Other (See Comments)   "

## 2024-11-27 NOTE — Patient Instructions (Addendum)
 Atrial Fibrillation Atrial fibrillation (AFib) is a type of irregular or rapid heartbeat (arrhythmia). In AFib, the top part of the heart (atria) beats in an irregular pattern. This makes the heart unable to pump blood normally and effectively. The goal of treatment is to prevent blood clots from forming, control your heart rate, or restore your heartbeat to a normal rhythm. If this condition is not treated, it can cause serious problems, such as a weakened heart muscle (cardiomyopathy) or a stroke. What are the causes? This condition is often caused by medical conditions that damage the heart's electrical system. These include: High blood pressure (hypertension). This is the most common cause. Certain heart problems or conditions, such as heart failure, coronary artery disease, heart valve problems, or heart surgery. Diabetes. Overactive thyroid  (hyperthyroidism). Chronic kidney disease. Certain lung conditions, such as emphysema, pneumonia, or COPD. Obstructive sleep apnea. In some cases, the cause of this condition is not known. What increases the risk? This condition is more likely to develop in: Older adults. Athletes who do endurance exercise. People who have a family history of AFib. Males. People who are Caucasian. People who are obese. People who smoke or misuse alcohol. What are the signs or symptoms? Symptoms of this condition include: Fast or irregular heartbeats (palpitations). Discomfort or pain in your chest. Shortness of breath. Sudden light-headedness or weakness. Tiring easily during exercise or activity. Syncope (fainting). Sweating. In some cases, there are no symptoms. How is this diagnosed? Your health care provider may detect AFib when taking your pulse. If detected, this condition may be diagnosed with: An electrocardiogram (ECG) to check electrical signals of the heart. An ambulatory cardiac monitor to record your heart's activity for a few days. A  transthoracic echocardiogram (TTE) to create pictures of your heart. A transesophageal echocardiogram (TEE) to create even clearer pictures of your heart. A stress test to check your blood supply while you exercise. Imaging tests, such as a CT scan or chest X-ray. Blood tests. How is this treated? Treatment depends on underlying conditions and how you feel when you get AFib. This condition may be treated with: Medicines to prevent blood clots or to treat heart rate or heart rhythm problems. Electrical cardioversion to reset the heart's rhythm. A pacemaker to correct abnormal heart rhythm. Ablation to remove the heart tissue that sends abnormal signals. Left atrial appendage closure to seal the area where blood clots can form. In some cases, underlying conditions will be treated. Follow these instructions at home: Medicines Take over-the counter and prescription medicines only as told by your provider. Do not take any new medicines without talking to your provider. If you are taking blood thinners: Talk with your provider before taking aspirin  or NSAIDs. These medicines can raise your risk of bleeding. Take your medicines as told. Take them at the same time each day. Do not do things that could hurt or bruise you. Be careful to avoid falls. Wear an alert bracelet or carry a card that says that you take blood thinners. Lifestyle Do not use any products that contain nicotine or tobacco. These products include cigarettes, chewing tobacco, and vaping devices, such as e-cigarettes. If you need help quitting, ask your provider. Eat heart-healthy foods. Talk with a food expert (dietitian) to make an eating plan that is right for you. Exercise regularly as told by your provider. Do not drink alcohol. Lose weight if you are overweight. General instructions If you have obstructive sleep apnea, manage your condition as told by your provider.  Do not use diet pills unless your provider approves. Diet  pills can make heart problems worse. Keep all follow-up visits. Your provider will want to check your heart rate and rhythm regularly. Contact a health care provider if: You notice a change in the rate, rhythm, or strength of your heartbeat. You are taking a blood thinner and you notice more bruising. You tire more easily when you exercise or do heavy work. You have a sudden change in weight. Get help right away if:  You have chest pain. You have trouble breathing. You have side effects of blood thinners, such as blood in your vomit, poop (stool), or pee (urine), or bleeding that does not stop. You have any symptoms of a stroke. BE FAST is an easy way to remember the main warning signs of a stroke: B - Balance. Signs are dizziness, sudden trouble walking, or loss of balance. E - Eyes. Signs are trouble seeing or a sudden change in vision. F - Face. Signs are sudden weakness or numbness of the face, or the face or eyelid drooping on one side. A - Arms. Signs are weakness or numbness in an arm. This happens suddenly and usually on one side of the body. S - Speech.Signs are sudden trouble speaking, slurred speech, or trouble understanding what people say. T - Time. Time to call emergency services. Write down what time symptoms started. Other signs of a stroke, such as: A sudden, severe headache with no known cause. Nausea or vomiting. Seizure. These symptoms may be an emergency. Get help right away. Call 911. Do not wait to see if the symptoms will go away. Do not drive yourself to the hospital. This information is not intended to replace advice given to you by your health care provider. Make sure you discuss any questions you have with your health care provider. Document Revised: 07/20/2022 Document Reviewed: 07/20/2022 Elsevier Patient Education  2024 [employer].

## 2025-02-04 ENCOUNTER — Ambulatory Visit: Payer: Self-pay | Admitting: Cardiology

## 2025-05-27 ENCOUNTER — Ambulatory Visit: Payer: Self-pay | Admitting: Family Medicine
